# Patient Record
Sex: Female | Born: 1950 | Race: White | Hispanic: No | Marital: Married | Smoking: Never smoker
Health system: Southern US, Community
[De-identification: ages and names within clinical notes are randomized; demographics above are authoritative.]

## PROBLEM LIST (undated history)

## (undated) ENCOUNTER — Emergency Department (HOSPITAL_COMMUNITY): Admission: EM | Payer: Self-pay | Source: Home / Self Care

## (undated) DIAGNOSIS — I10 Essential (primary) hypertension: Secondary | ICD-10-CM

## (undated) DIAGNOSIS — M069 Rheumatoid arthritis, unspecified: Secondary | ICD-10-CM

## (undated) DIAGNOSIS — G8929 Other chronic pain: Secondary | ICD-10-CM

## (undated) DIAGNOSIS — N390 Urinary tract infection, site not specified: Secondary | ICD-10-CM

## (undated) DIAGNOSIS — R202 Paresthesia of skin: Secondary | ICD-10-CM

## (undated) DIAGNOSIS — K5792 Diverticulitis of intestine, part unspecified, without perforation or abscess without bleeding: Secondary | ICD-10-CM

## (undated) DIAGNOSIS — I6529 Occlusion and stenosis of unspecified carotid artery: Secondary | ICD-10-CM

## (undated) DIAGNOSIS — E785 Hyperlipidemia, unspecified: Secondary | ICD-10-CM

## (undated) DIAGNOSIS — M549 Dorsalgia, unspecified: Secondary | ICD-10-CM

## (undated) DIAGNOSIS — J189 Pneumonia, unspecified organism: Secondary | ICD-10-CM

## (undated) DIAGNOSIS — R200 Anesthesia of skin: Secondary | ICD-10-CM

## (undated) DIAGNOSIS — M1990 Unspecified osteoarthritis, unspecified site: Secondary | ICD-10-CM

## (undated) DIAGNOSIS — M797 Fibromyalgia: Secondary | ICD-10-CM

## (undated) HISTORY — DX: Hyperlipidemia, unspecified: E78.5

## (undated) HISTORY — DX: Essential (primary) hypertension: I10

## (undated) HISTORY — PX: COLON SURGERY: SHX602

## (undated) HISTORY — DX: Occlusion and stenosis of unspecified carotid artery: I65.29

## (undated) HISTORY — PX: COLOSTOMY: SHX63

---

## 1998-09-11 ENCOUNTER — Encounter: Payer: Self-pay | Admitting: *Deleted

## 1998-09-11 ENCOUNTER — Emergency Department (HOSPITAL_COMMUNITY): Admission: EM | Admit: 1998-09-11 | Discharge: 1998-09-11 | Payer: Self-pay | Admitting: Emergency Medicine

## 1998-09-12 ENCOUNTER — Emergency Department (HOSPITAL_COMMUNITY): Admission: EM | Admit: 1998-09-12 | Discharge: 1998-09-12 | Payer: Self-pay | Admitting: Emergency Medicine

## 2000-03-12 ENCOUNTER — Encounter: Payer: Self-pay | Admitting: Internal Medicine

## 2000-03-12 ENCOUNTER — Encounter: Admission: RE | Admit: 2000-03-12 | Discharge: 2000-03-12 | Payer: Self-pay | Admitting: Internal Medicine

## 2001-08-01 ENCOUNTER — Other Ambulatory Visit: Admission: RE | Admit: 2001-08-01 | Discharge: 2001-08-01 | Payer: Self-pay

## 2002-02-19 ENCOUNTER — Encounter: Admission: RE | Admit: 2002-02-19 | Discharge: 2002-02-19 | Payer: Self-pay

## 2010-11-17 ENCOUNTER — Inpatient Hospital Stay (HOSPITAL_COMMUNITY)
Admission: EM | Admit: 2010-11-17 | Discharge: 2010-11-20 | Payer: Self-pay | Source: Home / Self Care | Attending: Internal Medicine | Admitting: Internal Medicine

## 2010-11-20 LAB — BASIC METABOLIC PANEL
BUN: 9 mg/dL (ref 6–23)
BUN: 10 mg/dL (ref 6–23)
CO2: 24 mEq/L (ref 19–32)
CO2: 24 mEq/L (ref 19–32)
Calcium: 9.6 mg/dL (ref 8.4–10.5)
Calcium: 8.2 mg/dL — ABNORMAL LOW (ref 8.4–10.5)
Chloride: 104 mEq/L (ref 96–112)
Chloride: 109 mEq/L (ref 96–112)
Creatinine, Ser: 1.21 mg/dL — ABNORMAL HIGH (ref 0.4–1.2)
Creatinine, Ser: 1.06 mg/dL (ref 0.4–1.2)
GFR calc Af Amer: 55 mL/min — ABNORMAL LOW (ref >60)
GFR calc Af Amer: >60 mL/min (ref >60)
GFR calc non Af Amer: 46 mL/min — ABNORMAL LOW (ref >60)
GFR calc non Af Amer: 53 mL/min — ABNORMAL LOW (ref >60)
Glucose, Bld: 109 mg/dL — ABNORMAL HIGH (ref 70–99)
Glucose, Bld: 96 mg/dL (ref 70–99)
Potassium: 4 mEq/L (ref 3.5–5.1)
Potassium: 3.9 mEq/L (ref 3.5–5.1)
Sodium: 140 mEq/L (ref 135–145)
Sodium: 141 mEq/L (ref 135–145)

## 2010-11-20 LAB — POCT CARDIAC MARKERS
CKMB, poc: <1 ng/mL — ABNORMAL LOW (ref 1.0–8.0)
Myoglobin, poc: 103 ng/mL (ref 12–200)
Troponin i, poc: <0.05 ng/mL (ref 0.00–0.09)

## 2010-11-20 LAB — URINALYSIS, ROUTINE W REFLEX MICROSCOPIC
Bilirubin Urine: NEGATIVE
Hgb urine dipstick: NEGATIVE
Ketones, ur: >80 mg/dL — AB
Nitrite: NEGATIVE
Protein, ur: NEGATIVE mg/dL
Specific Gravity, Urine: 1.017 (ref 1.005–1.030)
Urine Glucose, Fasting: NEGATIVE mg/dL
Urobilinogen, UA: 0.2 mg/dL (ref 0.0–1.0)
pH: 7.5 (ref 5.0–8.0)

## 2010-11-20 LAB — CBC
HCT: 43.8 % (ref 36.0–46.0)
HCT: 36.1 % (ref 36.0–46.0)
Hemoglobin: 14.8 g/dL (ref 12.0–15.0)
Hemoglobin: 11.9 g/dL — ABNORMAL LOW (ref 12.0–15.0)
MCH: 30.1 pg (ref 26.0–34.0)
MCH: 29.3 pg (ref 26.0–34.0)
MCHC: 33.8 g/dL (ref 30.0–36.0)
MCHC: 33 g/dL (ref 30.0–36.0)
MCV: 89 fL (ref 78.0–100.0)
MCV: 88.9 fL (ref 78.0–100.0)
Platelets: 172 10*3/uL (ref 150–400)
Platelets: 149 10*3/uL — ABNORMAL LOW (ref 150–400)
RBC: 4.92 MIL/uL (ref 3.87–5.11)
RBC: 4.06 MIL/uL (ref 3.87–5.11)
RDW: 14.2 % (ref 11.5–15.5)
RDW: 14.5 % (ref 11.5–15.5)
WBC: 5.8 10*3/uL (ref 4.0–10.5)
WBC: 4.5 10*3/uL (ref 4.0–10.5)

## 2010-11-20 LAB — DIFFERENTIAL
Basophils Absolute: 0 10*3/uL (ref 0.0–0.1)
Basophils Absolute: 0 10*3/uL (ref 0.0–0.1)
Basophils Relative: 1 % (ref 0–1)
Basophils Relative: 0 % (ref 0–1)
Eosinophils Absolute: 0.1 10*3/uL (ref 0.0–0.7)
Eosinophils Absolute: 0 10*3/uL (ref 0.0–0.7)
Eosinophils Relative: 2 % (ref 0–5)
Eosinophils Relative: 1 % (ref 0–5)
Lymphocytes Relative: 18 % (ref 12–46)
Lymphocytes Relative: 32 % (ref 12–46)
Lymphs Abs: 1 10*3/uL (ref 0.7–4.0)
Lymphs Abs: 1.4 10*3/uL (ref 0.7–4.0)
Monocytes Absolute: 0.4 10*3/uL (ref 0.1–1.0)
Monocytes Absolute: 0.5 10*3/uL (ref 0.1–1.0)
Monocytes Relative: 7 % (ref 3–12)
Monocytes Relative: 11 % (ref 3–12)
Neutro Abs: 4.2 10*3/uL (ref 1.7–7.7)
Neutro Abs: 2.5 10*3/uL (ref 1.7–7.7)
Neutrophils Relative %: 73 % (ref 43–77)
Neutrophils Relative %: 56 % (ref 43–77)

## 2010-11-20 LAB — BLOOD GAS, ARTERIAL
Acid-base deficit: 3.5 mmol/L — ABNORMAL HIGH (ref 0.0–2.0)
Bicarbonate: 17.1 mEq/L — ABNORMAL LOW (ref 20.0–24.0)
Drawn by: 252031
O2 Content: 2 L/min
O2 Saturation: 95.1 %
Patient temperature: 98.6
TCO2: 14.9 mmol/L (ref 0–100)
pCO2 arterial: 20.3 mmHg — ABNORMAL LOW (ref 35.0–45.0)
pH, Arterial: 7.535 — ABNORMAL HIGH (ref 7.350–7.400)
pO2, Arterial: 66.2 mmHg — ABNORMAL LOW (ref 80.0–100.0)

## 2010-11-20 LAB — PROCALCITONIN: Procalcitonin: <0.1 ng/mL

## 2010-11-20 LAB — BRAIN NATRIURETIC PEPTIDE: Pro B Natriuretic peptide (BNP): <30 pg/mL (ref 0.0–100.0)

## 2010-11-20 LAB — URINE MICROSCOPIC-ADD ON

## 2010-11-20 LAB — LACTIC ACID, PLASMA: Lactic Acid, Venous: 2.7 mmol/L — ABNORMAL HIGH (ref 0.5–2.2)

## 2010-11-20 LAB — D-DIMER, QUANTITATIVE: D-Dimer, Quant: <0.22 ug/mL-FEU (ref 0.00–0.48)

## 2010-11-28 NOTE — Discharge Summary (Signed)
  Jenny Glass, Jenny Glass NO.:  [id number]  MEDICAL RECORD NO.:  [id number]          PATIENT TYPE:  INP  LOCATION:  1338                         FACILITY:  [facility]  PHYSICIAN:  Jenny Sacks, MD    DATE OF BIRTH:  03-Jun-1951  DATE OF ADMISSION:  11/17/2010 DATE OF DISCHARGE:  11/20/2010                              DISCHARGE SUMMARY   CONDITION ON DISCHARGE:  Improved.  DISCHARGE DIAGNOSES: 1. Influenza like illness, resolved. 2. Presumed community acquired pneumonia. 3. Urinary tract infection. 4. Acute renal failure, resolved.  HISTORY OF PRESENT ILLNESS:  This is a 60 year old woman who presented to the emergency room with fever, chills and cough and was admitted for treatment of pneumonia.  HOSPITAL COURSE: 1. Influenza like illness.  The patient was treated with supportive     care and as below.  Symptoms quickly resolved. 2. Presumed community-acquired pneumonia.  The patient did not     tolerate Zithromax so she was changed Avelox and she will complete     this in the outpatient setting for a total 7 days.  She is afebrile     within normal respiratory rate and oxygenation.  Of note her     symptoms began approximately 5 days before presentation. 3. Urinary tract infection. Received three doses of Rocephin.  This     was treated empirically as no culture was obtained. 4. Acute renal failure.  This resolved and was likely secondary to     dehydration.  CONSULTATIONS:  None.  PROCEDURES:  None.  IMAGING:  Chest x-ray January 13th:  Mild changes of bronchitis or asthma.  MICROBIOLOGY:  None.  PERTINENT LABORATORY STUDIES: 1. Urinalysis was grossly positive. 2. D-dimer was negative. 3. ABG was notable for respiratory alkalosis and hypoxemia. 4. Serum lactic acid was minimally elevated, pro calcitonin was less     than 0.10. 5. BNP was less than 30. 6. CBC was essentially unremarkable. 7. Basic metabolic panel notable for creatinine 1.21 on  admission 1.06     on discharge. 8. One set of cardiac enzymes were negative.  EXAMINATION ON DISCHARGE:  Documented in progress notes.  DISCHARGE INSTRUCTIONS:  The patient will be discharged home.  Diet: Unrestricted.  Activity: Unrestricted.  She has been encouraged to establish with a primary care physician in 2-4 weeks.  DISCHARGE MEDICATIONS: 1. Albuterol inhaler 2 puffs every 4 hours as needed for shortness of     breath or wheezing. 2. Tussionex 5 mL p.o. q.12 h for cough p.r.n. 3. Moxifloxacin 400 mg p.o. daily to complete a 7 day course. 4. Aspirin 81 mg p.o. daily. 5. Vitamin B complex with vitamin C daily. 6. Vitamin D3 over-the-counter p.o. daily. 7. Vitamin E over-the-counter p.o. daily.  TIME COORDINATING DISCHARGE:  20 minutes.     Jenny Sacks, MD     DG/MEDQ  D:  11/20/2010  T:  11/20/2010  Job:  [id number]  Electronically Signed by Jenny Glass  on 11/28/2010 02:14:15 PM

## 2011-11-01 ENCOUNTER — Ambulatory Visit (INDEPENDENT_AMBULATORY_CARE_PROVIDER_SITE_OTHER): Payer: Managed Care, Other (non HMO)

## 2011-11-01 DIAGNOSIS — J029 Acute pharyngitis, unspecified: Secondary | ICD-10-CM

## 2011-11-01 DIAGNOSIS — J385 Laryngeal spasm: Secondary | ICD-10-CM

## 2011-11-01 DIAGNOSIS — K219 Gastro-esophageal reflux disease without esophagitis: Secondary | ICD-10-CM

## 2011-11-24 ENCOUNTER — Ambulatory Visit (INDEPENDENT_AMBULATORY_CARE_PROVIDER_SITE_OTHER): Payer: Managed Care, Other (non HMO)

## 2011-11-24 DIAGNOSIS — R1084 Generalized abdominal pain: Secondary | ICD-10-CM

## 2011-11-24 DIAGNOSIS — K5900 Constipation, unspecified: Secondary | ICD-10-CM

## 2012-02-16 ENCOUNTER — Ambulatory Visit (INDEPENDENT_AMBULATORY_CARE_PROVIDER_SITE_OTHER): Payer: Managed Care, Other (non HMO) | Admitting: Family Medicine

## 2012-02-16 VITALS — BP 112/74 | HR 81 | Temp 98.5°F | Resp 16 | Ht 63.25 in | Wt 162.8 lb

## 2012-02-16 DIAGNOSIS — K5732 Diverticulitis of large intestine without perforation or abscess without bleeding: Secondary | ICD-10-CM

## 2012-02-16 DIAGNOSIS — R1032 Left lower quadrant pain: Secondary | ICD-10-CM

## 2012-02-16 LAB — COMPREHENSIVE METABOLIC PANEL
ALT: <8 U/L (ref 0–35)
AST: 11 U/L (ref 0–37)
Albumin: 4.2 g/dL (ref 3.5–5.2)
Alkaline Phosphatase: 54 U/L (ref 39–117)
BUN: 11 mg/dL (ref 6–23)
CO2: 30 mEq/L (ref 19–32)
Calcium: 9.2 mg/dL (ref 8.4–10.5)
Chloride: 107 mEq/L (ref 96–112)
Creat: 1 mg/dL (ref 0.50–1.10)
Glucose, Bld: 93 mg/dL (ref 70–99)
Potassium: 4.2 mEq/L (ref 3.5–5.3)
Sodium: 144 mEq/L (ref 135–145)
Total Bilirubin: 0.4 mg/dL (ref 0.3–1.2)
Total Protein: 6.5 g/dL (ref 6.0–8.3)

## 2012-02-16 LAB — POCT CBC
Granulocyte percent: 62.9 %G (ref 37–80)
HCT, POC: 37.7 % (ref 37.7–47.9)
Hemoglobin: 11.8 g/dL — AB (ref 12.2–16.2)
Lymph, poc: 2.8 (ref 0.6–3.4)
MCH, POC: 27.5 pg (ref 27–31.2)
MCHC: 31.3 g/dL — AB (ref 31.8–35.4)
MCV: 87.8 fL (ref 80–97)
MID (cbc): 1 — AB (ref 0–0.9)
MPV: 10.5 fL (ref 0–99.8)
POC Granulocyte: 6.4 (ref 2–6.9)
POC LYMPH PERCENT: 27.6 %L (ref 10–50)
POC MID %: 9.5 %M (ref 0–12)
Platelet Count, POC: 242 10*3/uL (ref 142–424)
RBC: 4.29 M/uL (ref 4.04–5.48)
RDW, POC: 16.4 %
WBC: 10.2 10*3/uL (ref 4.6–10.2)

## 2012-02-16 LAB — POCT UA - MICROSCOPIC ONLY
Casts, Ur, LPF, POC: NEGATIVE
Crystals, Ur, HPF, POC: NEGATIVE
Mucus, UA: NEGATIVE
Yeast, UA: NEGATIVE

## 2012-02-16 LAB — POCT URINALYSIS DIPSTICK
Bilirubin, UA: NEGATIVE
Glucose, UA: NEGATIVE
Ketones, UA: NEGATIVE
Nitrite, UA: NEGATIVE
Protein, UA: NEGATIVE
Spec Grav, UA: 1.015
Urobilinogen, UA: 0.2
pH, UA: 6

## 2012-02-16 MED ORDER — TRAMADOL HCL 50 MG PO TABS: 50.0000 mg | ORAL_TABLET | Freq: Three times a day (TID) | ORAL | Status: AC | PRN

## 2012-02-16 MED ORDER — CIPROFLOXACIN HCL 500 MG PO TABS: 500.0000 mg | ORAL_TABLET | Freq: Two times a day (BID) | ORAL | Status: AC

## 2012-02-16 MED ORDER — METRONIDAZOLE 500 MG PO TABS: 500.0000 mg | ORAL_TABLET | Freq: Three times a day (TID) | ORAL | Status: AC

## 2012-02-16 NOTE — Patient Instructions (Signed)
Diverticulitis A diverticulum is a small pouch or sac on the colon. Diverticulosis is the presence of these diverticula on the colon. Diverticulitis is the irritation (inflammation) or infection of diverticula. CAUSES  The colon and its diverticula contain bacteria. If food particles block the tiny opening to a diverticulum, the bacteria inside can grow and cause an increase in pressure. This leads to infection and inflammation and is called diverticulitis. SYMPTOMS   Abdominal pain and tenderness. Usually, the pain is located on the left side of your abdomen. However, it could be located elsewhere.   Fever.   Bloating.   Feeling sick to your stomach (nausea).   Throwing up (vomiting).   Abnormal stools.  DIAGNOSIS  Your caregiver will take a history and perform a physical exam. Since many things can cause abdominal pain, other tests may be necessary. Tests may include:  Blood tests.   Urine tests.   X-ray of the abdomen.   CT scan of the abdomen.  Sometimes, surgery is needed to determine if diverticulitis or other conditions are causing your symptoms. TREATMENT  Most of the time, you can be treated without surgery. Treatment includes:  Resting the bowels by only having liquids for a few days. As you improve, you will need to eat a low-fiber diet.   Intravenous (IV) fluids if you are losing body fluids (dehydrated).   Antibiotic medicines that treat infections may be given.   Pain and nausea medicine, if needed.   Surgery if the inflamed diverticulum has burst.  HOME CARE INSTRUCTIONS   Try a clear liquid diet (broth, tea, or water for as long as directed by your caregiver). You may then gradually begin a low-fiber diet as tolerated. A low-fiber diet is a diet with less than 10 grams of fiber. Choose the foods below to reduce fiber in the diet:   White breads, cereals, rice, and pasta.   Cooked fruits and vegetables or soft fresh fruits and vegetables without the skin.     Ground or well-cooked tender beef, ham, veal, lamb, pork, or poultry.   Eggs and seafood.   After your diverticulitis symptoms have improved, your caregiver may put you on a high-fiber diet. A high-fiber diet includes 14 grams of fiber for every 1000 calories consumed. For a standard 2000 calorie diet, you would need 28 grams of fiber. Follow these diet guidelines to help you increase the fiber in your diet. It is important to slowly increase the amount fiber in your diet to avoid gas, constipation, and bloating.   Choose whole-grain breads, cereals, pasta, and brown rice.   Choose fresh fruits and vegetables with the skin on. Do not overcook vegetables because the more vegetables are cooked, the more fiber is lost.   Choose more nuts, seeds, legumes, dried peas, beans, and lentils.   Look for food products that have greater than 3 grams of fiber per serving on the Nutrition Facts label.   Take all medicine as directed by your caregiver.   If your caregiver has given you a follow-up appointment, it is very important that you go. Not going could result in lasting (chronic) or permanent injury, pain, and disability. If there is any problem keeping the appointment, call to reschedule.  SEEK MEDICAL CARE IF:   Your pain does not improve.   You have a hard time advancing your diet beyond clear liquids.   Your bowel movements do not return to normal.  SEEK IMMEDIATE MEDICAL CARE IF:   Your pain becomes   worse.   You have an oral temperature above 102 F (38.9 C), not controlled by medicine.   You have repeated vomiting.   You have bloody or black, tarry stools.   Symptoms that brought you to your caregiver become worse or are not getting better.  MAKE SURE YOU:   Understand these instructions.   Will watch your condition.   Will get help right away if you are not doing well or get worse.  Document Released: 08/01/2005 Document Revised: 10/11/2011 Document Reviewed:  11/27/2010 [employer] Patient Information 2012 [employer]. 

## 2012-02-16 NOTE — Progress Notes (Signed)
61 yo woman with LLQ pain.  Sx began as nausea on Wednesday, but yesterday awoke with sharp LLQ pain.  Pain worsens with movement or jarring.  Not able to sleep well, some sweats and chills. No h/o diverticulitis  O:  NAD Heart:  Reg, no murmur or tachycardia Chest:  Clear, no tachypnes Abdomen:  Tender LLQ, positive rebound and guarding but soft.  No HSM or mass Skin:  No jaundice  Results for orders placed during the hospital encounter of 11/17/10  URINALYSIS, ROUTINE W REFLEX MICROSCOPIC      Component Value Range   Color, Urine YELLOW  YELLOW    APPearance CLOUDY (*) CLEAR    Specific Gravity, Urine 1.017  1.005 - 1.030    pH 7.5  5.0 - 8.0    Urine Glucose, Fasting NEGATIVE  NEGATIVE (mg/dL)   Hgb urine dipstick NEGATIVE  NEGATIVE    Bilirubin Urine NEGATIVE  NEGATIVE    Ketones, ur >80 (*) NEGATIVE (mg/dL)   Protein, ur NEGATIVE  NEGATIVE (mg/dL)   Urobilinogen, UA 0.2  0.0 - 1.0 (mg/dL)   Nitrite NEGATIVE  NEGATIVE    Leukocytes, UA LARGE (*) NEGATIVE   URINE MICROSCOPIC-ADD ON      Component Value Range   Squamous Epithelial / LPF MANY (*) RARE    WBC, UA TOO NUMEROUS TO COUNT  <3 (WBC/hpf)   RBC / HPF 0-2  <3 (RBC/hpf)   Bacteria, UA FEW (*) RARE    Urine-Other MUCOUS PRESENT    DIFFERENTIAL      Component Value Range   Neutrophils Relative 73  43 - 77 (%)   Neutro Abs 4.2  1.7 - 7.7 (K/uL)   Lymphocytes Relative 18  12 - 46 (%)   Lymphs Abs 1.0  0.7 - 4.0 (K/uL)   Monocytes Relative 7  3 - 12 (%)   Monocytes Absolute 0.4  0.1 - 1.0 (K/uL)   Eosinophils Relative 2  0 - 5 (%)   Eosinophils Absolute 0.1  0.0 - 0.7 (K/uL)   Basophils Relative 1  0 - 1 (%)   Basophils Absolute 0.0  0.0 - 0.1 (K/uL)  CBC      Component Value Range   WBC 5.8  4.0 - 10.5 (K/uL)   RBC 4.92  3.87 - 5.11 (MIL/uL)   Hemoglobin 14.8  12.0 - 15.0 (g/dL)   HCT 16.1  09.6 - 04.5 (%)   MCV 89.0  78.0 - 100.0 (fL)   MCH 30.1  26.0 - 34.0 (pg)   MCHC 33.8  30.0 - 36.0 (g/dL)   RDW 40.9   81.1 - 91.4 (%)   Platelets 172  150 - 400 (K/uL)  D-DIMER, QUANTITATIVE      Component Value Range   D-Dimer, Quant    0.00 - 0.48 (ug/mL-FEU)   Value: <0.22            AT THE INHOUSE ESTABLISHED CUTOFF     VALUE OF 0.48 ug/mL FEU,     THIS ASSAY HAS BEEN DOCUMENTED     IN THE LITERATURE TO HAVE     A SENSITIVITY AND NEGATIVE     PREDICTIVE VALUE OF AT LEAST     98 TO 99%.  THE TEST RESULT     SHOULD BE CORRELATED WITH     AN ASSESSMENT OF THE CLINICAL     PROBABILITY OF DVT / VTE.  BASIC METABOLIC PANEL      Component Value Range   Sodium 140  135 - 145 (mEq/L)   Potassium 4.0  3.5 - 5.1 (mEq/L)   Chloride 104  96 - 112 (mEq/L)   CO2 24  19 - 32 (mEq/L)   Glucose, Bld 109 (*) 70 - 99 (mg/dL)   BUN 9  6 - 23 (mg/dL)   Creatinine, Ser 1.19 (*) 0.4 - 1.2 (mg/dL)   Calcium 9.6  8.4 - 14.7 (mg/dL)   GFR calc non Af Amer 46 (*) >60 (mL/min)   GFR calc Af Amer   (*) >60 (mL/min)   Value: 55            The eGFR has been calculated     using the MDRD equation.     This calculation has not been     validated in all clinical     situations.     eGFR's persistently     <60 mL/min signify     possible Chronic Kidney Disease.  POCT CARDIAC MARKERS      Component Value Range   Myoglobin, poc 103  12 - 200 (ng/mL)   CKMB, poc <1.0 (*) 1.0 - 8.0 (ng/mL)   Troponin i, poc <0.05  0.00 - 0.09 (ng/mL)   Comment       Value:            TROPONIN VALUES IN THE RANGE     OF 0.00-0.09 ng/mL SHOW     NO INDICATION OF     MYOCARDIAL INJURY.                PERSISTENTLY INCREASED TROPONIN     VALUES IN THE RANGE OF 0.10-0.24     ng/mL CAN BE SEEN IN:           -UNSTABLE ANGINA           -CONGESTIVE HEART FAILURE           -MYOCARDITIS           -CHEST TRAUMA           -ARRYHTHMIAS           -LATE PRESENTING MI           -COPD       CLINICAL FOLLOW-UP RECOMMENDED.                TROPONIN VALUES >=0.25 ng/mL     INDICATE POSSIBLE MYOCARDIAL     ISCHEMIA. SERIAL TESTING      RECOMMENDED.  BLOOD GAS, ARTERIAL      Component Value Range   O2 Content 2.0     Delivery systems NASAL CANNULA     pH, Arterial 7.535 (*) 7.350 - 7.400    pCO2 arterial 20.3 (*) 35.0 - 45.0 (mmHg)   pO2, Arterial 66.2 (*) 80.0 - 100.0 (mmHg)   Bicarbonate 17.1 (*) 20.0 - 24.0 (mEq/L)   TCO2 14.9  0 - 100 (mmol/L)   Acid-base deficit 3.5 (*) 0.0 - 2.0 (mmol/L)   O2 Saturation 95.1     Patient temperature 98.6     Collection site RIGHT RADIAL     Drawn by [id number]     Sample type ARTERIAL DRAW     Allens test (pass/fail) PASS  PASS   LACTIC ACID, PLASMA      Component Value Range   Lactic Acid, Venous 2.7 (*) 0.5 - 2.2 (mmol/L)  BRAIN NATRIURETIC PEPTIDE      Component Value Range   Pro B Natriuretic peptide (BNP) <30.0  0.0 - 100.0 (pg/mL)  PROCALCITONIN      Component Value Range   Procalcitonin       Value: <0.10            PCT (Procalcitonin) <= 0.5 ng/mL:     Systemic infection (sepsis) is not likely.     Local bacterial infection is possible.                PCT > 0.5 ng/mL and <= 2 ng/mL:     Systemic infection (sepsis) is possible,     but other conditions are known to elevate     PCT as well.                PCT > 2 ng/mL:     Systemic infection (sepsis) is likely,     unless other causes are known.                PCT >= 10 ng/mL:     Important systemic inflammatory response,     almost exclusively due to severe bacterial     sepsis or septic shock.  BASIC METABOLIC PANEL      Component Value Range   Sodium 141  135 - 145 (mEq/L)   Potassium 3.9  3.5 - 5.1 (mEq/L)   Chloride 109  96 - 112 (mEq/L)   CO2 24  19 - 32 (mEq/L)   Glucose, Bld 96  70 - 99 (mg/dL)   BUN 10  6 - 23 (mg/dL)   Creatinine, Ser 1.61  0.4 - 1.2 (mg/dL)   Calcium 8.2 (*) 8.4 - 10.5 (mg/dL)   GFR calc non Af Amer 53 (*) >60 (mL/min)   GFR calc Af Amer    >60 (mL/min)   Value: >60            The eGFR has been calculated     using the MDRD equation.     This calculation has not been      validated in all clinical     situations.     eGFR's persistently     <60 mL/min signify     possible Chronic Kidney Disease.  DIFFERENTIAL      Component Value Range   Neutrophils Relative 56  43 - 77 (%)   Neutro Abs 2.5  1.7 - 7.7 (K/uL)   Lymphocytes Relative 32  12 - 46 (%)   Lymphs Abs 1.4  0.7 - 4.0 (K/uL)   Monocytes Relative 11  3 - 12 (%)   Monocytes Absolute 0.5  0.1 - 1.0 (K/uL)   Eosinophils Relative 1  0 - 5 (%)   Eosinophils Absolute 0.0  0.0 - 0.7 (K/uL)   Basophils Relative 0  0 - 1 (%)   Basophils Absolute 0.0  0.0 - 0.1 (K/uL)  CBC      Component Value Range   WBC 4.5  4.0 - 10.5 (K/uL)   RBC 4.06  3.87 - 5.11 (MIL/uL)   Hemoglobin   (*) 12.0 - 15.0 (g/dL)   Value: 09.6 DELTA CHECK NOTED RESULT REPEATED AND VERIFIED   HCT 36.1  36.0 - 46.0 (%)   MCV 88.9  78.0 - 100.0 (fL)   MCH 29.3  26.0 - 34.0 (pg)   MCHC 33.0  30.0 - 36.0 (g/dL)   RDW 04.5  40.9 - 81.1 (%)   Platelets 149 (*) 150 - 400 (K/uL)   A: UTI with diverticulitis Not acutely ill P:  gI referral for colonoscopy Urine cx cipro and flagyl,  tramadol Call Monday INB

## 2012-02-19 LAB — URINE CULTURE: Colony Count: >=100000

## 2012-02-21 ENCOUNTER — Encounter: Payer: Self-pay | Admitting: Family Medicine

## 2012-04-07 LAB — HM COLONOSCOPY

## 2012-04-16 ENCOUNTER — Other Ambulatory Visit: Payer: Self-pay | Admitting: Emergency Medicine

## 2012-04-16 ENCOUNTER — Ambulatory Visit
Admission: RE | Admit: 2012-04-16 | Discharge: 2012-04-16 | Disposition: A | Discharge status: Home / Self Care | Payer: Managed Care, Other (non HMO) | Source: Ambulatory Visit | Attending: Emergency Medicine | Admitting: Emergency Medicine

## 2012-04-16 ENCOUNTER — Ambulatory Visit (INDEPENDENT_AMBULATORY_CARE_PROVIDER_SITE_OTHER): Payer: Managed Care, Other (non HMO) | Admitting: Emergency Medicine

## 2012-04-16 VITALS — BP 119/83 | HR 72 | Temp 98.5°F | Resp 16 | Ht 63.0 in | Wt 162.0 lb

## 2012-04-16 DIAGNOSIS — I700 Atherosclerosis of aorta: Secondary | ICD-10-CM

## 2012-04-16 DIAGNOSIS — I739 Peripheral vascular disease, unspecified: Secondary | ICD-10-CM

## 2012-04-16 MED ORDER — TRAMADOL HCL 50 MG PO TABS: 50.0000 mg | ORAL_TABLET | Freq: Four times a day (QID) | ORAL | Status: AC | PRN

## 2012-04-16 NOTE — Patient Instructions (Addendum)
Place peripheral vascular disease patient instructions here. Peripheral Vascular Disease Peripheral Vascular Disease (PVD), also called Peripheral Arterial Disease (PAD), is a circulation problem caused by cholesterol (atherosclerotic plaque) deposits in the arteries. PVD commonly occurs in the lower extremities (legs) but it can occur in other areas of the body, such as your arms. The cholesterol buildup in the arteries reduces blood flow which can cause pain and other serious problems. The presence of PVD can place a person at risk for Coronary Artery Disease (CAD).  CAUSES  Causes of PVD can be many. It is usually associated with more than one risk factor such as:   High Cholesterol.   Smoking.   Diabetes.   Lack of exercise or inactivity.   High blood pressure (hypertension).   Obesity.   Family history.  SYMPTOMS   When the lower extremities are affected, patients with PVD may experience:   Leg pain with exertion or physical activity. This is called INTERMITTENT CLAUDICATION. This may present as cramping or numbness with physical activity. The location of the pain is associated with the level of blockage. For example, blockage at the abdominal level (distal abdominal aorta) may result in buttock or hip pain. Lower leg arterial blockage may result in calf pain.   As PVD becomes more severe, pain can develop with less physical activity.   In people with severe PVD, leg pain may occur at rest.   Other PVD signs and symptoms:   Leg numbness or weakness.   Coldness in the affected leg or foot, especially when compared to the other leg.   A change in leg color.   Patients with significant PVD are more prone to ulcers or sores on toes, feet or legs. These may take longer to heal or may reoccur. The ulcers or sores can become infected.   If signs and symptoms of PVD are ignored, gangrene may occur. This can result in the loss of toes or loss of an entire limb.   Not all leg  pain is related to PVD. Other medical conditions can cause leg pain such as:   Blood clots (embolism) or Deep Vein Thrombosis.   Inflammation of the blood vessels (vasculitis).   Spinal stenosis.  DIAGNOSIS  Diagnosis of PVD can involve several different types of tests. These can include:  Pulse Volume Recording Method (PVR). This test is simple, painless and does not involve the use of X-rays. PVR involves measuring and comparing the blood pressure in the arms and legs. An ABI (Ankle-Brachial Index) is calculated. The normal ratio of blood pressures is 1. As this number becomes smaller, it indicates more severe disease.   < 0.95 - indicates significant narrowing in one or more leg vessels.   <0.8 - there will usually be pain in the foot, leg or buttock with exercise.   <0.4 - will usually have pain in the legs at rest.   <0.25 - usually indicates limb threatening PVD.   Doppler detection of pulses in the legs. This test is painless and checks to see if you have a pulses in your legs/feet.   A dye or contrast material (a substance that highlights the blood vessels so they show up on x-ray) may be given to help your caregiver better see the arteries for the following tests. The dye is eliminated from your body by the kidney's. Your caregiver may order blood work to check your kidney function and other laboratory values before the following tests are performed:   Magnetic Resonance  Angiography (MRA). An MRA is a picture study of the blood vessels and arteries. The MRA machine uses a large magnet to produce images of the blood vessels.   Computed Tomography Angiography (CTA). A CTA is a specialized x-ray that looks at how the blood flows in your blood vessels. An IV may be inserted into your arm so contrast dye can be injected.   Angiogram. Is a procedure that uses x-rays to look at your blood vessels. This procedure is minimally invasive, meaning a small incision (cut) is made in your  groin. A small tube (catheter) is then inserted into the artery of your groin. The catheter is guided to the blood vessel or artery your caregiver wants to examine. Contrast dye is injected into the catheter. X-rays are then taken of the blood vessel or artery. After the images are obtained, the catheter is taken out.  TREATMENT  Treatment of PVD involves many interventions which may include:  Lifestyle changes:   Quitting smoking.   Exercise.   Following a low fat, low cholesterol diet.   Control of diabetes.   Foot care is very important to the PVD patient. Good foot care can help prevent infection.   Medication:   Cholesterol-lowering medicine.   Blood pressure medicine.   Anti-platelet drugs.   Certain medicines may reduce symptoms of Intermittent Claudication.   Interventional/Surgical options:   Angioplasty. An Angioplasty is a procedure that inflates a balloon in the blocked artery. This opens the blocked artery to improve blood flow.   Stent Implant. A wire mesh tube (stent) is placed in the artery. The stent expands and stays in place, allowing the artery to remain open.   Peripheral Bypass Surgery. This is a surgical procedure that reroutes the blood around a blocked artery to help improve blood flow. This type of procedure may be performed if Angioplasty or stent implants are not an option.  SEEK IMMEDIATE MEDICAL CARE IF:   You develop pain or numbness in your arms or legs.   Your arm or leg turns cold, becomes blue in color.   You develop redness, warmth, swelling and pain in your arms or legs.  MAKE SURE YOU:   Understand these instructions.   Will watch your condition.   Will get help right away if you are not doing well or get worse.  Document Released: 11/29/2004 Document Revised: 10/11/2011 Document Reviewed: 10/26/2008 [facility] Patient Information 2012 [place], [place].

## 2012-04-16 NOTE — Progress Notes (Signed)
  Subjective:     Jenny Glass is an 61 y.o. female who presents for evaluation of claudication. Pain is located mainly in the left buttocks, left thigh, right calf and left calf The pain is described as aching and burning, and is 3/10 in intensity. Pain is intermittent. Onset was 2 weeks ago. Symptoms have been worsening since that time. The patient's risks factors for atherosclerosis include: smoking. The patient denies a history of arteriosclerotic heart disease, cerebral vascular disease, diabetes mellitus, hypercholesterolemia, hypertension and peripheral vascular disease.  The following portions of the patient's history were reviewed and updated as appropriate: allergies, current medications, past family history, past medical history, past social history, past surgical history and problem list.  Review of Systems A comprehensive review of systems was negative.     Objective:    BP 119/83  Pulse 72  Temp 98.5 F (36.9 C) (Oral)  Resp 16  Ht 5\' 3"  (1.6 m)  Wt 162 lb (73.483 kg)  BMI 28.70 kg/m2  SpO2 99%  General Appearance:    Alert, cooperative, no distress, appears stated age  Head:    Normocephalic, without obvious abnormality, atraumatic  Eyes:    PERRL, conjunctiva/corneas clear, EOM's intact, fundi    benign, both eyes  Ears:    Normal TM's and external ear canals, both ears  Nose:   Nares normal, septum midline, mucosa normal, no drainage    or sinus tenderness  Throat:   Lips, mucosa, and tongue normal; teeth and gums normal  Neck:   Supple, symmetrical, trachea midline, no adenopathy;    thyroid:  no enlargement/tenderness/nodules; no carotid   bruit or JVD  Back:     Symmetric, no curvature, ROM normal, no CVA tenderness  Lungs:     Clear to auscultation bilaterally, respirations unlabored  Chest Wall:    No tenderness or deformity   Heart:    Regular rate and rhythm, S1 and S2 normal, no murmur, rub   or gallop  Breast Exam:    No tenderness, masses, or nipple  abnormality  Abdomen:     Soft, non-tender, bowel sounds active all four quadrants,    no masses, no organomegaly  Prominent, pulsatile non tender aorta  Genitalia:    Normal female without lesion, discharge or tenderness  Rectal:    Normal tone, normal prostate, no masses or tenderness;   guaiac negative stool  Extremities:   Extremities normal, atraumatic, no cyanosis or edema  Pulses:  absent in lower extremities 1+ femoral bilaterally  Skin:   Skin color, texture, turgor normal, no rashes or lesions  Lymph nodes:   Cervical, supraclavicular, and axillary nodes normal  Neurologic:   CNII-XII intact, normal strength, sensation and reflexes    throughout      Assessment:    abdominal aortic aneurysm, limb threatening ischemia    Plan:    Obtain ankle-brachial index.   Patient will follow up with me by phone tomorrow for results and if negative will consider HNP.

## 2012-04-17 ENCOUNTER — Ambulatory Visit (HOSPITAL_COMMUNITY)
Admission: RE | Admit: 2012-04-17 | Discharge: 2012-04-17 | Disposition: A | Discharge status: Home / Self Care | Payer: Managed Care, Other (non HMO) | Source: Ambulatory Visit | Attending: Emergency Medicine | Admitting: Emergency Medicine

## 2012-04-17 DIAGNOSIS — M79609 Pain in unspecified limb: Secondary | ICD-10-CM

## 2012-04-17 DIAGNOSIS — I739 Peripheral vascular disease, unspecified: Secondary | ICD-10-CM | POA: Insufficient documentation

## 2012-04-17 DIAGNOSIS — R0989 Other specified symptoms and signs involving the circulatory and respiratory systems: Secondary | ICD-10-CM | POA: Insufficient documentation

## 2012-04-17 NOTE — Progress Notes (Signed)
*  PRELIMINARY RESULTS* Vascular Ultrasound Lower Extremity Arterial Doppler has been completed.    VASCULAR LAB PRELIMINARY  ARTERIAL  ABI completed: Bilateral normal ABI study.    RIGHT    LEFT    PRESSURE WAVEFORM  PRESSURE WAVEFORM  BRACHIAL 127 tri BRACHIAL 126 tri  DP   DP    AT 141 tri AT 140 tri  PT 145 tri PT 135 tri  PER   PER    GREAT TOE  NA GREAT TOE  NA    RIGHT LEFT  ABI 1.14 1.10     Farrel Demark, RDMS 04/17/2012, 9:35 AM      Farrel Demark 04/17/2012, 9:34 AM

## 2012-10-11 ENCOUNTER — Ambulatory Visit (INDEPENDENT_AMBULATORY_CARE_PROVIDER_SITE_OTHER): Payer: BC Managed Care – PPO | Admitting: Family Medicine

## 2012-10-11 VITALS — BP 117/81 | HR 82 | Temp 98.1°F | Resp 16 | Ht 63.0 in | Wt 170.0 lb

## 2012-10-11 DIAGNOSIS — R110 Nausea: Secondary | ICD-10-CM

## 2012-10-11 DIAGNOSIS — J329 Chronic sinusitis, unspecified: Secondary | ICD-10-CM

## 2012-10-11 DIAGNOSIS — J40 Bronchitis, not specified as acute or chronic: Secondary | ICD-10-CM

## 2012-10-11 MED ORDER — HYDROCODONE-HOMATROPINE 5-1.5 MG/5ML PO SYRP: 5.0000 mL | ORAL_SOLUTION | ORAL | Status: DC | PRN

## 2012-10-11 MED ORDER — AMOXICILLIN 875 MG PO TABS: 875.0000 mg | ORAL_TABLET | Freq: Two times a day (BID) | ORAL | Status: DC

## 2012-10-11 MED ORDER — PROMETHAZINE HCL 12.5 MG PO TABS: ORAL_TABLET | ORAL | Status: DC

## 2012-10-11 NOTE — Patient Instructions (Signed)
Drink lots of fluids and get enough rest.  Tylenol or ibuprofen if achy or running a fever.  Return if worse.

## 2012-10-11 NOTE — Progress Notes (Signed)
Subjective: 61 year old female who has had problems with a respiratory tract infection for about 6 days. She had head congestion and drainage down the back of her throat. Some runny nose. It's settled in her chest she's been coughing the last couple of days. She's been nauseous. She does not get an excessive number of respiratory tract infection. She's not been running any fevers. She just doesn't feel good.  Objective: Pleasant lady in no major distress. TMs are normal. She said the right ear hurts some but it looks fine. The left is mostly occluded by some cerumen. Throat has erythema posteriorly where she said postnasal drainage. Neck is supple without nodes. Chest is clear. Heart regular without murmurs. Abdomen soft nontender.  Assessment: Sinusitis/bronchitis Cerumen left ear Nausea  Plan: medications ordered for the cough and nausea. Encouraged to drink a lot of fluids. If she is having problems she is to come back. She can use some OTC debrox to try and get the wax out of the left ear.

## 2013-02-27 ENCOUNTER — Ambulatory Visit
Admission: RE | Admit: 2013-02-27 | Discharge: 2013-02-27 | Disposition: A | Discharge status: Home / Self Care | Payer: BC Managed Care – PPO | Source: Ambulatory Visit | Attending: Internal Medicine | Admitting: Internal Medicine

## 2013-02-27 ENCOUNTER — Ambulatory Visit: Payer: BC Managed Care – PPO

## 2013-02-27 ENCOUNTER — Ambulatory Visit (INDEPENDENT_AMBULATORY_CARE_PROVIDER_SITE_OTHER): Payer: BC Managed Care – PPO | Admitting: Internal Medicine

## 2013-02-27 ENCOUNTER — Telehealth: Payer: Self-pay | Admitting: Radiology

## 2013-02-27 VITALS — BP 142/98 | HR 68 | Temp 97.8°F | Resp 18 | Ht 64.0 in | Wt 177.0 lb

## 2013-02-27 DIAGNOSIS — S0093XA Contusion of unspecified part of head, initial encounter: Secondary | ICD-10-CM

## 2013-02-27 DIAGNOSIS — M542 Cervicalgia: Secondary | ICD-10-CM

## 2013-02-27 DIAGNOSIS — S0003XA Contusion of scalp, initial encounter: Secondary | ICD-10-CM

## 2013-02-27 DIAGNOSIS — R42 Dizziness and giddiness: Secondary | ICD-10-CM

## 2013-02-27 NOTE — Progress Notes (Signed)
  Subjective:    Patient ID: Jenny Glass, female    DOB: September 28, 1951, 63 y.o.   MRN: [id number]  HPI At home, mowing lawn with riding lawn mower, got out of control and climbed a fence and was thrown backwards off mower. Landed on back of head and thinks she was loc a few seconds. Has no wound, no signs of bruising, is generalized tender neck and occiput and across entire back. She has no function loss. She is dizzy and moves slower. No vision or sensory changes. No other bone, joint pains. No chest or abdominal sxs or complaints. Memory in tact.  Review of Systems On no meds and no diseases.     Objective:   Physical Exam  Vitals reviewed. Constitutional: She is oriented to person, place, and time. She appears well-developed and well-nourished. She appears distressed.  Cardiovascular: Normal rate, regular rhythm and normal heart sounds.   Pulmonary/Chest: Effort normal and breath sounds normal.  Abdominal: Soft. Bowel sounds are normal. She exhibits no mass. There is no tenderness.  Musculoskeletal: She exhibits tenderness.  Neurological: She is alert and oriented to person, place, and time. She has normal reflexes. No cranial nerve deficit. She exhibits normal muscle tone. Coordination normal.  Skin: Skin is warm, dry and intact.  Psychiatric: Her speech is normal. Judgment and thought content normal. Her mood appears anxious. She is slowed. Cognition and memory are normal.    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

## 2013-02-27 NOTE — Telephone Encounter (Signed)
Call report/ patient has had CT head normal / CT C spine negative for fracture. Patient does have Soft tissue mass ? Lymph node, see full report. Patient advised no acute findings from the injury and we will call next week with a full report. amy

## 2013-02-27 NOTE — Patient Instructions (Addendum)
You can go now for walk in appt at [facility] [address]    Driving directions to [address] [place], [place], [place] [address] [id number]  - more info    [address]  [place], [place] [address]     1. Head south on [address] toward [address]      0.5 mi    2. Sharp left onto [address]      0.6 mi    3. Turn left onto the [address] ramp      0.2 mi    4. Merge onto [address]      3.0 mi    5. Continue straight to stay on [address]      0.4 mi    6. Slight left to stay on [facility]  Destination will be on the right     1.0 mi     [address] [place], [place] [address]

## 2013-02-28 ENCOUNTER — Ambulatory Visit (INDEPENDENT_AMBULATORY_CARE_PROVIDER_SITE_OTHER): Payer: BC Managed Care – PPO | Admitting: Family Medicine

## 2013-02-28 VITALS — BP 120/82 | HR 65 | Temp 97.9°F | Resp 16 | Ht 63.0 in | Wt 176.2 lb

## 2013-02-28 DIAGNOSIS — R9389 Abnormal findings on diagnostic imaging of other specified body structures: Secondary | ICD-10-CM

## 2013-02-28 DIAGNOSIS — K219 Gastro-esophageal reflux disease without esophagitis: Secondary | ICD-10-CM

## 2013-02-28 DIAGNOSIS — S0990XA Unspecified injury of head, initial encounter: Secondary | ICD-10-CM

## 2013-02-28 DIAGNOSIS — J387 Other diseases of larynx: Secondary | ICD-10-CM

## 2013-02-28 DIAGNOSIS — R059 Cough, unspecified: Secondary | ICD-10-CM

## 2013-02-28 DIAGNOSIS — Z5189 Encounter for other specified aftercare: Secondary | ICD-10-CM

## 2013-02-28 DIAGNOSIS — R51 Headache: Secondary | ICD-10-CM

## 2013-02-28 DIAGNOSIS — R05 Cough: Secondary | ICD-10-CM

## 2013-02-28 DIAGNOSIS — S060X9D Concussion with loss of consciousness of unspecified duration, subsequent encounter: Secondary | ICD-10-CM

## 2013-02-28 MED ORDER — OMEPRAZOLE 20 MG PO CPDR: 20.0000 mg | DELAYED_RELEASE_CAPSULE | Freq: Every day | ORAL | Status: DC

## 2013-02-28 NOTE — Progress Notes (Signed)
Subjective:    Patient ID: Jenny Glass, female    DOB: 10/01/1951, 62 y.o.   MRN: [id number]  HPI Jenny Glass is a 62 y.o. female  NO primary provider.   See ov yesterday with Dr. Perrin Maltese - flipped back from lawnmower and hit head and neck, with LOC.  Sent for CT head and neck as below.   CT head:  IMPRESSION:  Normal for age noncontrast appearance of the brain. No acute  traumatic injury identified.  CT cervical spine: IMPRESSION:  Moderate degenerative changes in the cervical spine.  Negative for fracture.  19 x 29 mm soft tissue mass right lower neck, suspicious for  enlarged lymph node. Further evaluation with CT neck with contrast  is suggested. This is not felt to be related to the patient's  acute injury.   Taking tylenol.  Sore all over today, sore in legs, but walking ok.  Sore in front of head, and in back of head where hit ground. No LOC since yesterday, less dizziness, no new visual changes (blurry yesterday, better today), no N/V., no new areas of focal weakness.  Has strength in arms and legs. No hip pain. Does admit to having some frontal headaches before accident. Noticed more since accident.   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  Not taking any acid blockers recently. No heartburn.   Review of Systems  Constitutional: Negative for fever and chills.  Eyes: Negative for photophobia and visual disturbance.  Respiratory: Positive for cough (intermittent with choking sensation. ). Negative for shortness of breath.   Gastrointestinal: Negative for nausea and vomiting.  Musculoskeletal: Positive for myalgias. Negative for arthralgias (denies hip pain, or pain with wt bear. ).  Neurological: Positive for seizures and headaches. Negative for dizziness, facial asymmetry,  weakness and light-headedness.       Objective:   Physical Exam  Vitals reviewed. Constitutional: She is oriented to person, place, and time. She appears well-developed and well-nourished. No distress.  HENT:  Head: Normocephalic and atraumatic.  Right Ear: Hearing, tympanic membrane, external ear and ear canal normal.  Left Ear: Hearing, tympanic membrane, external ear and ear canal normal.  Nose: Nose normal.  Mouth/Throat: Oropharynx is clear and moist. No oropharyngeal exudate.  Eyes: Conjunctivae and EOM are normal. Pupils are equal, round, and reactive to light.  Neck: No thyromegaly present.  Nontender, no apparent LAD or palpable mass appreciated.   Cardiovascular: Normal rate, regular rhythm, normal heart sounds and intact distal pulses.   No murmur heard. Pulmonary/Chest: Effort normal and breath sounds normal. No stridor. No respiratory distress. She has no wheezes. She has no rhonchi.  Musculoskeletal:       Right hip: She exhibits normal range of motion and no bony tenderness.       Left hip: She exhibits normal range of motion and no bony tenderness.  Lymphadenopathy:    She has no cervical adenopathy.  Neurological: She is alert and oriented to person, place, and time. She has normal strength. No sensory deficit. She displays a negative Romberg sign.  No pronator drift. Somewhat unsteady with heel to toe.    Skin: Skin is warm and dry. No rash noted.  Psychiatric: She has a normal mood and affect. Her behavior is normal.  Assessment & Plan:  KRYSTN DERMODY is a 62 y.o. female Head injury, unspecified  Headache, Concussion with loss of consciousness, unspecified duration, subsequent encounter - improving but still some balance difficulty on provocative testing. Discussed initial CT reassuring, but still needs to watch for any worsening of symptoms/head injury precautions below.  Discussed concussion treatment and course and recommended follow up in few days  if not improving. Understanding expressed.  LPRD (laryngopharyngeal reflux disease) - Plan: omeprazole (PRILOSEC) 20 MG capsule - suspected LPR cause of cough,.can try prilosec, trigger avoidance, but recommend ENT eval of choking sensation if not improved in next few weeks.   Abnormal CT scan, neck - discussed neck mass and need for further imaging, including reasons for eval.  Patent and person in room with her refused any further workup stating she didn't need it.  I attempted to answer any questions and clarify concerns of cost of study, but still refused. Advised of risks of not evaluating including possible underlying infection or malignancy and understanding expressed. If she changes her mind, I will order the CT of neck with contrast without office visit needed.   Meds ordered this encounter  Medications  . omeprazole (PRILOSEC) 20 MG capsule    Sig: Take 1 capsule (20 mg total) by mouth daily.    Dispense:  30 capsule    Refill:  0     Patient Instructions  You likely have had a concussion form your head injury yesterday, and the pain in the neck is likely a flair of arthritis.  You can take tylenol as needed for pain for now.  Recheck in next few days if not much improved - return sooner or to an emergency room if any worsening as discussed below for head injuries.   HEAD INJURY If any of the following occur notify your physician or go to the Hospital Emergency Department: . Increased drowsiness, stupor or loss of consciousness . Restlessness or convulsions (fits) . Paralysis in arms or legs . Temperature above 100 F . Vomiting . Severe headache . Blood or clear fluid dripping from the nose or ears . Stiffness of the neck . Dizziness or blurred vision . Pulsating pain in the eye . Unequal pupils of eye . Personality changes . Any other unusual symptoms PRECAUTIONS . Keep head elevated at all times for the first 24 hours (Elevate mattress if pillow is ineffective) . Do  not take tranquilizers, sedatives, narcotics or alcohol . Avoid aspirin. Use only acetaminophen (e.g. Tylenol) or ibuprofen (e.g. Advil) for relief of pain. Follow directions on the bottle for dosage. . Use ice packs for comfort MEDICATIONS Use medications only as directed by your physician    You did have an abnormal mass in the neck that is recommended to have further evaluation. I have copied the notes from the radiologist below. If you would like to proceed with the workup of this as recommended - call and I will be happy to schedule this study.   "19 x 29 mm soft tissue mass right lower neck, suspicious for enlarged lymph node. Further evaluation with CT neck with contrast is suggested."  Your cough and choking sensation may be from acid reflux, even without the sensation of heartburn. You can try omeprazole - 1 per day and avoid spicy foods, caffeine.  If not improving with this in next 2-3 weeks., recommend follow up with ear nose and throat specialist, and I would be happy to refer you if needed.

## 2013-02-28 NOTE — Patient Instructions (Signed)
You likely have had a concussion form your head injury yesterday, and the pain in the neck is likely a flair of arthritis.  You can take tylenol as needed for pain for now.  Recheck in next few days if not much improved - return sooner or to an emergency room if any worsening as discussed below for head injuries.   HEAD INJURY If any of the following occur notify your physician or go to the Hospital Emergency Department: . Increased drowsiness, stupor or loss of consciousness . Restlessness or convulsions (fits) . Paralysis in arms or legs . Temperature above 100 F . Vomiting . Severe headache . Blood or clear fluid dripping from the nose or ears . Stiffness of the neck . Dizziness or blurred vision . Pulsating pain in the eye . Unequal pupils of eye . Personality changes . Any other unusual symptoms PRECAUTIONS . Keep head elevated at all times for the first 24 hours (Elevate mattress if pillow is ineffective) . Do not take tranquilizers, sedatives, narcotics or alcohol . Avoid aspirin. Use only acetaminophen (e.g. Tylenol) or ibuprofen (e.g. Advil) for relief of pain. Follow directions on the bottle for dosage. . Use ice packs for comfort MEDICATIONS Use medications only as directed by your physician    You did have an abnormal mass in the neck that is recommended to have further evaluation. I have copied the notes from the radiologist below. If you would like to proceed with the workup of this as recommended - call and I will be happy to schedule this study.   "19 x 29 mm soft tissue mass right lower neck, suspicious for enlarged lymph node. Further evaluation with CT neck with contrast is suggested."  Your cough and choking sensation may be from acid reflux, even without the sensation of heartburn. You can try omeprazole - 1 per day and avoid spicy foods, caffeine.  If not improving with this in next 2-3 weeks., recommend follow up with ear nose and throat specialist, and I  would be happy to refer you if needed.

## 2013-03-05 NOTE — Telephone Encounter (Signed)
Send copy of ct scan report and have her see her family doctor to follow up on lymph node

## 2013-03-05 NOTE — Telephone Encounter (Signed)
Called her, no answer will try again later.

## 2013-03-06 NOTE — Telephone Encounter (Signed)
Mailed letter to her along with the scan results.

## 2013-03-06 NOTE — Telephone Encounter (Signed)
Patient does not return my calls, have mailed her a copy of the scan with instructions to follow up with her PCP about the Lymph node seen on imaging.

## 2013-03-16 ENCOUNTER — Encounter (HOSPITAL_COMMUNITY): Payer: Self-pay | Admitting: Emergency Medicine

## 2013-03-16 ENCOUNTER — Emergency Department (HOSPITAL_COMMUNITY)
Admission: EM | Admit: 2013-03-16 | Discharge: 2013-03-17 | Disposition: A | Discharge status: Home / Self Care | Payer: BC Managed Care – PPO | Attending: Emergency Medicine | Admitting: Emergency Medicine

## 2013-03-16 DIAGNOSIS — Z87891 Personal history of nicotine dependence: Secondary | ICD-10-CM | POA: Insufficient documentation

## 2013-03-16 DIAGNOSIS — R197 Diarrhea, unspecified: Secondary | ICD-10-CM | POA: Insufficient documentation

## 2013-03-16 DIAGNOSIS — R1110 Vomiting, unspecified: Secondary | ICD-10-CM

## 2013-03-16 DIAGNOSIS — N12 Tubulo-interstitial nephritis, not specified as acute or chronic: Secondary | ICD-10-CM

## 2013-03-16 DIAGNOSIS — R112 Nausea with vomiting, unspecified: Secondary | ICD-10-CM | POA: Insufficient documentation

## 2013-03-16 LAB — LIPASE, BLOOD
Lipase: 71 U/L — ABNORMAL HIGH (ref 11–59)
Lipase: 26 U/L (ref 11–59)

## 2013-03-16 LAB — URINALYSIS, ROUTINE W REFLEX MICROSCOPIC
Glucose, UA: NEGATIVE mg/dL
Ketones, ur: 15 mg/dL — AB
Nitrite: POSITIVE — AB
Protein, ur: 30 mg/dL — AB
Specific Gravity, Urine: 1.033 — ABNORMAL HIGH (ref 1.005–1.030)
Urobilinogen, UA: 0.2 mg/dL (ref 0.0–1.0)
pH: 5 (ref 5.0–8.0)

## 2013-03-16 LAB — COMPREHENSIVE METABOLIC PANEL
ALT: 17 U/L (ref 0–35)
AST: 12 U/L (ref 0–37)
Albumin: 4.2 g/dL (ref 3.5–5.2)
Alkaline Phosphatase: 62 U/L (ref 39–117)
BUN: 18 mg/dL (ref 6–23)
CO2: 24 mEq/L (ref 19–32)
Calcium: 9.3 mg/dL (ref 8.4–10.5)
Chloride: 105 mEq/L (ref 96–112)
Creatinine, Ser: 0.99 mg/dL (ref 0.50–1.10)
GFR calc Af Amer: 70 mL/min — ABNORMAL LOW (ref >90)
GFR calc non Af Amer: 61 mL/min — ABNORMAL LOW (ref >90)
Glucose, Bld: 152 mg/dL — ABNORMAL HIGH (ref 70–99)
Potassium: 4 mEq/L (ref 3.5–5.1)
Sodium: 142 mEq/L (ref 135–145)
Total Bilirubin: 0.5 mg/dL (ref 0.3–1.2)
Total Protein: 6.8 g/dL (ref 6.0–8.3)

## 2013-03-16 LAB — CBC WITH DIFFERENTIAL/PLATELET
Basophils Absolute: 0 10*3/uL (ref 0.0–0.1)
Basophils Relative: 0 % (ref 0–1)
Eosinophils Absolute: 0.1 10*3/uL (ref 0.0–0.7)
Eosinophils Relative: 1 % (ref 0–5)
HCT: 42.9 % (ref 36.0–46.0)
Hemoglobin: 14.8 g/dL (ref 12.0–15.0)
Lymphocytes Relative: 2 % — ABNORMAL LOW (ref 12–46)
Lymphs Abs: 0.3 10*3/uL — ABNORMAL LOW (ref 0.7–4.0)
MCH: 29.8 pg (ref 26.0–34.0)
MCHC: 34.5 g/dL (ref 30.0–36.0)
MCV: 86.3 fL (ref 78.0–100.0)
Monocytes Absolute: 0.5 10*3/uL (ref 0.1–1.0)
Monocytes Relative: 4 % (ref 3–12)
Neutro Abs: 10.3 10*3/uL — ABNORMAL HIGH (ref 1.7–7.7)
Neutrophils Relative %: 93 % — ABNORMAL HIGH (ref 43–77)
Platelets: 228 10*3/uL (ref 150–400)
RBC: 4.97 MIL/uL (ref 3.87–5.11)
RDW: 13.9 % (ref 11.5–15.5)
WBC: 11 10*3/uL — ABNORMAL HIGH (ref 4.0–10.5)

## 2013-03-16 LAB — URINE MICROSCOPIC-ADD ON

## 2013-03-16 MED ORDER — SODIUM CHLORIDE 0.9 % IV BOLUS (SEPSIS)
1000.0000 mL | Freq: Once | INTRAVENOUS | Status: AC
  Administered 2013-03-16: 1000 mL via INTRAVENOUS

## 2013-03-16 MED ORDER — ONDANSETRON 4 MG PO TBDP
8.0000 mg | ORAL_TABLET | Freq: Once | ORAL | Status: AC
  Administered 2013-03-16: 8 mg via ORAL

## 2013-03-16 MED ORDER — CEPHALEXIN 500 MG PO CAPS: 500.0000 mg | ORAL_CAPSULE | Freq: Two times a day (BID) | ORAL | Status: DC

## 2013-03-16 MED ORDER — ONDANSETRON HCL 4 MG/2ML IJ SOLN
4.0000 mg | Freq: Once | INTRAMUSCULAR | Status: AC
  Administered 2013-03-16: 4 mg via INTRAVENOUS
  Filled 2013-03-16: qty 2

## 2013-03-16 MED ORDER — LOPERAMIDE HCL 2 MG PO CAPS: 2.0000 mg | ORAL_CAPSULE | Freq: Four times a day (QID) | ORAL | Status: DC | PRN

## 2013-03-16 MED ORDER — DEXTROSE 5 % IV SOLN
1.0000 g | Freq: Once | INTRAVENOUS | Status: AC
  Administered 2013-03-16: 1 g via INTRAVENOUS
  Filled 2013-03-16: qty 10

## 2013-03-16 MED ORDER — KETOROLAC TROMETHAMINE 30 MG/ML IJ SOLN
30.0000 mg | Freq: Once | INTRAMUSCULAR | Status: AC
  Administered 2013-03-16: 30 mg via INTRAVENOUS
  Filled 2013-03-16: qty 1

## 2013-03-16 MED ORDER — MORPHINE SULFATE 4 MG/ML IJ SOLN
4.0000 mg | Freq: Once | INTRAMUSCULAR | Status: AC
  Administered 2013-03-16: 4 mg via INTRAVENOUS
  Filled 2013-03-16: qty 1

## 2013-03-16 NOTE — ED Notes (Signed)
Pt. Was unable to use the bathroom to give a urin

## 2013-03-16 NOTE — ED Notes (Signed)
Pt updated on care 

## 2013-03-16 NOTE — ED Notes (Signed)
Pt c.o N/V/D and abd pain starting this am

## 2013-03-17 NOTE — ED Notes (Signed)
Rx x 2.  Pt voiced understanding to f/u with PCP and return for worsening condition.  

## 2013-03-17 NOTE — ED Provider Notes (Signed)
History     CSN: [id number]  Arrival date & time 03/16/13  1615   First MD Initiated Contact with Patient 03/16/13 1950      Chief Complaint  Patient presents with  . Abdominal Pain  . Emesis    HPI 62 yo F presents complaining of vomiting, abdominal pain, and flank pain. - Onset this morning.  Pain is located to LLQ of her abdomen and radiates to left low back.  Pain is constant, moderate in severity, aggravated by prolonged standing and certain positions, and relieved by laying flat on her back and being still.  It has been associated with non-bloody and non-bilious emesis, approximately 10 times.  Additionally, she has had watery diarrhea more than 10 times today.  She has had urinary frequency but no dysuria.  She has had diffuse myalgias.  No fever, though she has had chills.  No history of kidney stones.  No recent antibiotic use or hospitalizations.     History reviewed. No pertinent past medical history.  History reviewed. No pertinent past surgical history.  Family History  Problem Relation Age of Onset  . Cancer Mother   . Cancer Father     History  Substance Use Topics  . Smoking status: Former Games developer  . Smokeless tobacco: Not on file  . Alcohol Use: No    OB History   Grav Para Term Preterm Abortions TAB SAB Ect Mult Living                  Review of Systems  Constitutional: Positive for chills and fatigue. Negative for fever and diaphoresis.  HENT: Negative for congestion, rhinorrhea, neck pain and neck stiffness.   Respiratory: Negative for cough, shortness of breath and wheezing.   Cardiovascular: Negative for chest pain and leg swelling.  Gastrointestinal: Positive for nausea, vomiting, abdominal pain and diarrhea.  Genitourinary: Positive for frequency and flank pain. Negative for dysuria, urgency, vaginal bleeding, vaginal discharge and difficulty urinating.  Musculoskeletal: Positive for back pain.  Skin: Negative for rash.  Neurological:  Negative for weakness, numbness and headaches.  All other systems reviewed and are negative.    Allergies  Review of patient's allergies indicates no known allergies.  Home Medications   Current Outpatient Rx  Name  Route  Sig  Dispense  Refill  . Acetaminophen (TYLENOL PO)   Oral   Take 2 tablets by mouth every 6 (six) hours as needed (for pain).         . cephALEXin (KEFLEX) 500 MG capsule   Oral   Take 1 capsule (500 mg total) by mouth 2 (two) times daily. Take 1 capsule (50 mg total) by mouth 2 times daily for 14 days.   28 capsule   0   . loperamide (IMODIUM) 2 MG capsule   Oral   Take 1 capsule (2 mg total) by mouth 4 (four) times daily as needed for diarrhea or loose stools.   12 capsule   0     BP 91/46  Pulse 74  Temp(Src) 98.1 F (36.7 C) (Oral)  Resp 15  SpO2 94%  Physical Exam  Nursing note and vitals reviewed. Constitutional: She is oriented to person, place, and time. She appears well-developed and well-nourished. No distress.  Ill but non-toxic appearing  HENT:  Head: Normocephalic and atraumatic.  Mouth/Throat: Oropharynx is clear and moist.  Eyes: Conjunctivae and EOM are normal. Pupils are equal, round, and reactive to light. No scleral icterus.  Neck: Normal range of  motion. Neck supple. No JVD present.  Cardiovascular: Normal rate, regular rhythm, normal heart sounds and intact distal pulses.  Exam reveals no gallop and no friction rub.   No murmur heard. Pulmonary/Chest: Effort normal and breath sounds normal. No respiratory distress. She has no wheezes. She has no rales.  Abdominal: Soft. Normal appearance and bowel sounds are normal. She exhibits no distension. There is no tenderness. There is no rigidity, no rebound, no guarding, no tenderness at McBurney's point and negative Murphy's sign.  Musculoskeletal: She exhibits no edema.  Neurological: She is alert and oriented to person, place, and time. No cranial nerve deficit. She exhibits  normal muscle tone. Coordination normal.  Skin: Skin is warm and dry. She is not diaphoretic.    ED Course  Procedures (including critical care time)  Labs Reviewed  CBC WITH DIFFERENTIAL - Abnormal; Notable for the following:    WBC 11.0 (*)    Neutrophils Relative 93 (*)    Neutro Abs 10.3 (*)    Lymphocytes Relative 2 (*)    Lymphs Abs 0.3 (*)    All other components within normal limits  COMPREHENSIVE METABOLIC PANEL - Abnormal; Notable for the following:    Glucose, Bld 152 (*)    GFR calc non Af Amer 61 (*)    GFR calc Af Amer 70 (*)    All other components within normal limits  LIPASE, BLOOD - Abnormal; Notable for the following:    Lipase 71 (*)    All other components within normal limits  URINALYSIS, ROUTINE W REFLEX MICROSCOPIC - Abnormal; Notable for the following:    Color, Urine AMBER (*)    APPearance TURBID (*)    Specific Gravity, Urine 1.033 (*)    Hgb urine dipstick LARGE (*)    Bilirubin Urine SMALL (*)    Ketones, ur 15 (*)    Protein, ur 30 (*)    Nitrite POSITIVE (*)    Leukocytes, UA MODERATE (*)    All other components within normal limits  URINE MICROSCOPIC-ADD ON - Abnormal; Notable for the following:    Squamous Epithelial / LPF FEW (*)    Bacteria, UA MANY (*)    Casts HYALINE CASTS (*)    All other components within normal limits  URINE CULTURE  LIPASE, BLOOD   No results found.   1. Pyelonephritis   2. Vomiting and diarrhea       MDM  62 yo F, presviously healthy, presents with one day of nausea, vomiting, diarrhea, flank pain, back pain, and myalgias.  She is afebrile, borderline hypotensive to 90/68, tachycardic to the 110 range, otherwise normal vitals; she appears ill but non-toxic.  In NAD.  Dry mucus membranes.  Exam otherwise remarkable only for LLQ abd tenderness but with no rebound or guarding; mild L CVA TTP.    Her labs demonstrate mild leukocytosis, normal creatinine and electrolytes, lipase 71, UA with ketones, +  nitrite, moderate leukocytes, many bacteria.  Urine culture sent.    Given obvious UTI + significant systemic symptoms, will treat as pyelonephritis.  Gave IV rocephin dose in ED.  Gave 2 L IVF bolus with improvement of BP to 110s and resolution of tachycardia.  Treated pain with morphine, toradol; gave zofran.  On reevaluation, patient looks and feels much better.  There is likely also an element of gastroenteritis with dehydration given the degree of diarrhea she has had.  She is discharged with RX for 14 days of Keflex as well as RX  for immodium.  She is given return precautions.  She is to f/u with PCP in 2 days for reevaluation.     Toney Sang, MD 03/17/13 1209  I performed a history and physical examination of  Jenny Glass and discussed her management with Dr. Peyton Najjar and agree with the history, physical, assessment, and plan of care, with the following exceptions: None I was present for the following procedures: None  Time Spent in Critical Care of the patient: None  Time spent in discussions with the patient and family: 10 minutes  Pt has URI like sx, with some nausea, back pain - tx is as pyelonephritis. No need for admission. Derwood Kaplan  Derwood Kaplan, MD 03/23/13 [phone]

## 2013-03-18 LAB — URINE CULTURE: Colony Count: >=100000

## 2013-03-20 NOTE — ED Notes (Signed)
Post ED Visit - Positive Culture Follow-up  Culture report reviewed by antimicrobial stewardship pharmacist: []  Wes Dulaney, Pharm.D., BCPS [x]  Celedonio Miyamoto, Pharm.D., BCPS []  Georgina Pillion, Pharm.D., BCPS []  [place], [place].D., BCPS, AAHIVP []  Estella Husk, Pharm.D., BCPS, AAHIV  Positive urine culture Treated with Cephalexin, organism sensitive to the same and no further patient follow-up is required at this time.  Larena Sox 03/20/2013, 6:36 PM

## 2013-05-31 ENCOUNTER — Ambulatory Visit (INDEPENDENT_AMBULATORY_CARE_PROVIDER_SITE_OTHER): Payer: BC Managed Care – PPO | Admitting: Physician Assistant

## 2013-05-31 VITALS — BP 118/76 | HR 69 | Temp 98.0°F | Resp 18 | Ht 63.0 in | Wt 176.8 lb

## 2013-05-31 DIAGNOSIS — M545 Low back pain, unspecified: Secondary | ICD-10-CM

## 2013-05-31 DIAGNOSIS — R109 Unspecified abdominal pain: Secondary | ICD-10-CM

## 2013-05-31 DIAGNOSIS — R350 Frequency of micturition: Secondary | ICD-10-CM

## 2013-05-31 LAB — POCT UA - MICROSCOPIC ONLY
Casts, Ur, LPF, POC: NEGATIVE
Crystals, Ur, HPF, POC: NEGATIVE
Mucus, UA: NEGATIVE
RBC, urine, microscopic: NEGATIVE
Yeast, UA: NEGATIVE

## 2013-05-31 LAB — POCT URINALYSIS DIPSTICK
Bilirubin, UA: NEGATIVE
Glucose, UA: NEGATIVE
Ketones, UA: NEGATIVE
Nitrite, UA: POSITIVE
Protein, UA: NEGATIVE
Spec Grav, UA: 1.01
Urobilinogen, UA: 0.2
pH, UA: 5.5

## 2013-05-31 LAB — POCT CBC
Granulocyte percent: 59.4 %G (ref 37–80)
HCT, POC: 38.6 % (ref 37.7–47.9)
Hemoglobin: 11.7 g/dL — AB (ref 12.2–16.2)
Lymph, poc: 2.3 (ref 0.6–3.4)
MCH, POC: 28.7 pg (ref 27–31.2)
MCHC: 30.3 g/dL — AB (ref 31.8–35.4)
MCV: 94.9 fL (ref 80–97)
MID (cbc): 0.6 (ref 0–0.9)
MPV: 10.5 fL (ref 0–99.8)
POC Granulocyte: 4.3 (ref 2–6.9)
POC LYMPH PERCENT: 32 %L (ref 10–50)
POC MID %: 8.6 %M (ref 0–12)
Platelet Count, POC: 193 10*3/uL (ref 142–424)
RBC: 4.07 M/uL (ref 4.04–5.48)
RDW, POC: 15.7 %
WBC: 7.3 10*3/uL (ref 4.6–10.2)

## 2013-05-31 MED ORDER — CIPROFLOXACIN HCL 500 MG PO TABS: 500.0000 mg | ORAL_TABLET | Freq: Two times a day (BID) | ORAL | Status: DC

## 2013-05-31 NOTE — Progress Notes (Signed)
Subjective:    Patient ID: Jenny Glass, female    DOB: 19-Aug-1951, 62 y.o.   MRN: [id number]  HPI 62 year old female presents with 4-5 day history of lower abdominal pain, suprapubic pressure, and lower back pain.  Also complains of subjective fever, chills, and body aches.  Admits to an episode of similar sx's in 03/2013 treated by the ED with Keflex.  She reports improvement of sx's after this course of treatment.  Had a positive urine culture revealing E. Coli.  Admits it has been "years" since she has had a UTI prior to this most recent episode.  Says the symptoms she is experiencing now are similar to what she had in May. Does have a hx of back pain treated by an orthopedist but she describes this as different. Has nausea without vomiting.   Otherwise doing well with no other concerns today.     Review of Systems  Constitutional: Positive for fever (subjective) and chills.  Gastrointestinal: Positive for nausea and abdominal pain (suprapubic and LLQ). Negative for vomiting.  Genitourinary: Positive for frequency and flank pain. Negative for dysuria.  Neurological: Negative for dizziness and headaches.       Objective:   Physical Exam  Constitutional: She is oriented to person, place, and time. She appears well-developed and well-nourished.  HENT:  Head: Normocephalic and atraumatic.  Right Ear: External ear normal.  Left Ear: External ear normal.  Eyes: Conjunctivae are normal.  Neck: Normal range of motion.  Cardiovascular: Normal rate.   Pulmonary/Chest: Effort normal.  Abdominal: Soft. Bowel sounds are normal. There is tenderness (suprapubic and LLQ). There is no rebound, no guarding and no CVA tenderness.  Neurological: She is alert and oriented to person, place, and time.  Psychiatric: She has a normal mood and affect. Her behavior is normal. Judgment and thought content normal.      Results for orders placed in visit on 05/31/13  POCT URINALYSIS DIPSTICK      Result  Value Range   Color, UA yellow     Clarity, UA clear     Glucose, UA neg     Bilirubin, UA neg     Ketones, UA neg     Spec Grav, UA 1.010     Blood, UA trace-lysed     pH, UA 5.5     Protein, UA neg     Urobilinogen, UA 0.2     Nitrite, UA positive     Leukocytes, UA moderate (2+)    POCT UA - MICROSCOPIC ONLY      Result Value Range   WBC, Ur, HPF, POC 10-15     RBC, urine, microscopic neg     Bacteria, U Microscopic 2+     Mucus, UA neg     Epithelial cells, urine per micros 0-2     Crystals, Ur, HPF, POC neg     Casts, Ur, LPF, POC neg     Yeast, UA neg    POCT CBC      Result Value Range   WBC 7.3  4.6 - 10.2 K/uL   Lymph, poc 2.3  0.6 - 3.4   POC LYMPH PERCENT 32.0  10 - 50 %L   MID (cbc) 0.6  0 - 0.9   POC MID % 8.6  0 - 12 %M   POC Granulocyte 4.3  2 - 6.9   Granulocyte percent 59.4  37 - 80 %G   RBC 4.07  4.04 - 5.48 M/uL  Hemoglobin 11.7 (*) 12.2 - 16.2 g/dL   HCT, POC 09.8  11.9 - 47.9 %   MCV 94.9  80 - 97 fL   MCH, POC 28.7  27 - 31.2 pg   MCHC 30.3 (*) 31.8 - 35.4 g/dL   RDW, POC 14.7     Platelet Count, POC 193  142 - 424 K/uL   MPV 10.5  0 - 99.8 fL       Assessment & Plan:  Frequency of urination - Plan: POCT urinalysis dipstick, POCT UA - Microscopic Only, POCT CBC, Urine culture, ciprofloxacin (CIPRO) 500 MG tablet  Lower back pain - Plan: POCT urinalysis dipstick, POCT UA - Microscopic Only  Abdominal pain, unspecified site - Plan: POCT CBC  Urine culture sent Start Cipro 500 mg bid x 7 days Increase fluids and rest Return in 48-72 hours if no improvement, sooner if worse.

## 2013-06-01 ENCOUNTER — Emergency Department (HOSPITAL_COMMUNITY)
Admission: EM | Admit: 2013-06-01 | Discharge: 2013-06-01 | Disposition: A | Discharge status: Home / Self Care | Payer: BC Managed Care – PPO | Attending: Emergency Medicine | Admitting: Emergency Medicine

## 2013-06-01 ENCOUNTER — Encounter (HOSPITAL_COMMUNITY): Payer: Self-pay | Admitting: *Deleted

## 2013-06-01 DIAGNOSIS — G8929 Other chronic pain: Secondary | ICD-10-CM | POA: Insufficient documentation

## 2013-06-01 DIAGNOSIS — Z7982 Long term (current) use of aspirin: Secondary | ICD-10-CM | POA: Insufficient documentation

## 2013-06-01 DIAGNOSIS — K5900 Constipation, unspecified: Secondary | ICD-10-CM | POA: Insufficient documentation

## 2013-06-01 DIAGNOSIS — M6281 Muscle weakness (generalized): Secondary | ICD-10-CM | POA: Insufficient documentation

## 2013-06-01 DIAGNOSIS — Z87440 Personal history of urinary (tract) infections: Secondary | ICD-10-CM | POA: Insufficient documentation

## 2013-06-01 DIAGNOSIS — Z79899 Other long term (current) drug therapy: Secondary | ICD-10-CM | POA: Insufficient documentation

## 2013-06-01 DIAGNOSIS — Z87891 Personal history of nicotine dependence: Secondary | ICD-10-CM | POA: Insufficient documentation

## 2013-06-01 DIAGNOSIS — M545 Low back pain, unspecified: Secondary | ICD-10-CM

## 2013-06-01 DIAGNOSIS — E669 Obesity, unspecified: Secondary | ICD-10-CM | POA: Insufficient documentation

## 2013-06-01 DIAGNOSIS — R350 Frequency of micturition: Secondary | ICD-10-CM | POA: Insufficient documentation

## 2013-06-01 DIAGNOSIS — R1084 Generalized abdominal pain: Secondary | ICD-10-CM | POA: Insufficient documentation

## 2013-06-01 DIAGNOSIS — R110 Nausea: Secondary | ICD-10-CM | POA: Insufficient documentation

## 2013-06-01 HISTORY — DX: Dorsalgia, unspecified: M54.9

## 2013-06-01 HISTORY — DX: Other chronic pain: G89.29

## 2013-06-01 MED ORDER — ONDANSETRON HCL 8 MG PO TABS: 8.0000 mg | ORAL_TABLET | Freq: Three times a day (TID) | ORAL | Status: DC | PRN

## 2013-06-01 MED ORDER — HYDROMORPHONE HCL PF 1 MG/ML IJ SOLN
1.0000 mg | Freq: Once | INTRAMUSCULAR | Status: AC
  Administered 2013-06-01: 1 mg via INTRAMUSCULAR
  Filled 2013-06-01: qty 1

## 2013-06-01 MED ORDER — KETOROLAC TROMETHAMINE 60 MG/2ML IM SOLN
60.0000 mg | Freq: Once | INTRAMUSCULAR | Status: AC
  Administered 2013-06-01: 60 mg via INTRAMUSCULAR
  Filled 2013-06-01: qty 2

## 2013-06-01 MED ORDER — OXYCODONE-ACETAMINOPHEN 5-325 MG PO TABS: 1.0000 | ORAL_TABLET | ORAL | Status: DC | PRN

## 2013-06-01 NOTE — ED Provider Notes (Signed)
Medical screening examination/treatment/procedure(s) were performed by non-physician practitioner and as supervising physician I was immediately available for consultation/collaboration.  Sunnie Nielsen, MD 06/01/13 [phone]

## 2013-06-01 NOTE — ED Notes (Signed)
Pt states understanding of discharge instructions 

## 2013-06-01 NOTE — ED Provider Notes (Signed)
CSN: [id number]     Arrival date & time 06/01/13  0330 History     First MD Initiated Contact with Patient 06/01/13 [phone]     Chief Complaint  Patient presents with  . Abdominal Pain   (Consider location/radiation/quality/duration/timing/severity/associated sxs/prior Treatment) HPI History provided by pt.   Pt a poor historian.  Presents w/ 2 days severe, diffuse low back pain.  Associated w/ achiness and weakness bilateral LEs and is aggravated by walking.  No relief w/ gabapentin and vicodin.  These sx are chronic x several months and are typical w/ exception of increased intensity.  Denies recent trauma.  Denies fever.  Has not had a BM in 1 week but does not have any urge to, constipation chronic and she is on opiates.  Has has pain across lower abd, nausea urinary frequency and dribbling following urination, but is currently being treated w/ cipro for UTI, diagnosed at [facility] yesterday.  Has had an MRI in the recent past and is followed closely by [facility].  Has an appointment w/ ortho this week for an injection.  No past medical history on file. No past surgical history on file. Family History  Problem Relation Age of Onset  . Cancer Mother   . Cancer Father    History  Substance Use Topics  . Smoking status: Former Games developer  . Smokeless tobacco: Not on file  . Alcohol Use: No   OB History   Grav Para Term Preterm Abortions TAB SAB Ect Mult Living                 Review of Systems  All other systems reviewed and are negative.    Allergies  Review of patient's allergies indicates no known allergies.  Home Medications   Current Outpatient Rx  Name  Route  Sig  Dispense  Refill  . Ascorbic Acid (VITAMIN C PO)   Oral   Take 1 tablet by mouth daily.         Marland Kitchen aspirin EC 81 MG tablet   Oral   Take 81 mg by mouth daily.         . ciprofloxacin (CIPRO) 500 MG tablet   Oral   Take 1 tablet (500 mg total) by mouth 2 (two) times daily.   14 tablet   0   . Cyanocobalamin (VITAMIN B-12 CR PO)   Oral   Take 1 tablet by mouth daily.         . Flaxseed, Linseed, (FLAX SEED OIL PO)   Oral   Take 1 capsule by mouth daily.         Marland Kitchen gabapentin (NEURONTIN) 300 MG capsule   Oral   Take 300 mg by mouth 2 (two) times daily.         Marland Kitchen GARLIC PO   Oral   Take 1 tablet by mouth daily.         Marland Kitchen HYDROcodone-acetaminophen (NORCO) 10-325 MG per tablet   Oral   Take 1-2 tablets by mouth every 6 (six) hours as needed for pain.         Marland Kitchen omega-3 acid ethyl esters (LOVAZA) 1 G capsule   Oral   Take 2 g by mouth daily.         . vitamin E 100 UNIT capsule   Oral   Take 100 Units by mouth daily.          BP 138/84  Pulse 73  Temp(Src) 98.2 F (36.8  C) (Oral)  Resp 17  SpO2 95% Physical Exam  Nursing note and vitals reviewed. Constitutional: She is oriented to person, place, and time. She appears well-developed and well-nourished.  Uncomfortable appearing; sitting on edge of bed.    HENT:  Head: Normocephalic and atraumatic.  Eyes:  Normal appearance  Neck: Normal range of motion.  Cardiovascular: Normal rate and regular rhythm.   Pulmonary/Chest: Effort normal and breath sounds normal.  Abdominal: Soft. Bowel sounds are normal. She exhibits no distension.  Obese.  Mild, diffuse ttp.   Genitourinary:  Bilateral CVA ttp  Musculoskeletal:  Mild tenderness at approx L1-L2 as well as bilateral lumbar musculature. Full active ROM of LE.  5/5 hip abduction/adduction and ankle plantar/dorsiflexion strength.  Nml patellar reflexes.  No saddle anesthesia. Distal sensation intact.  2+ DP pulses.  Ambulates w/out diffulty.   Neurological: She is alert and oriented to person, place, and time.  Skin: Skin is warm and dry. No rash noted.  Psychiatric: She has a normal mood and affect. Her behavior is normal.    ED Course   Procedures (including critical care time)  Labs Reviewed - No data to display No results found. 1. Low  back pain     MDM  62yo F w/ chronic low back pain presents w/ non-traumatic, severe, diffuse low back pain x 2d.  Pain typical w/ exception of intensity.  Followed closely by [facility], had recent MRI lumbar spine and has an appt this week for injection of some sort.  Has h/o pyelonephritis in 03/2013 and has taken two doses of cipro for UTI diagnosed at urgent care yesterday.  Afebrile, uncomfortable appearing, bony tenderness at L1-L2 but no worse than soft tissues, no NV deficits LEs, ambulatory, abd soft/non-distended and diffusely, mildly ttp, bilateral CVA ttp on exam.  Patient's symptoms may be an acute exacerbation of her chronic back pain or less likely, pyelonephritis.  She is on an appropriate abx (pending culture). Pain improved w/ IM toradol and dilaudid.  She has f/u scheduled.  I will refer to pain management as well.  Return precautions, including fever, uncontrolled vomiting, worsening abd pain, inability to ambulate d/t weakness/numbness, loss of control of bladder/bowels discussed.    Otilio Miu, PA-C 06/01/13 [phone]

## 2013-06-01 NOTE — ED Notes (Signed)
Pt c/o of lower abdominal, lower back pain, bilateral lower extremity pain. LBM was a week ago. Denies difficulty voiding.

## 2013-06-03 LAB — URINE CULTURE: Colony Count: >=100000

## 2013-06-05 ENCOUNTER — Emergency Department (HOSPITAL_COMMUNITY): Payer: BC Managed Care – PPO

## 2013-06-05 ENCOUNTER — Encounter (HOSPITAL_COMMUNITY): Payer: Self-pay | Admitting: *Deleted

## 2013-06-05 ENCOUNTER — Emergency Department (HOSPITAL_COMMUNITY)
Admission: EM | Admit: 2013-06-05 | Discharge: 2013-06-05 | Disposition: A | Discharge status: Home / Self Care | Payer: BC Managed Care – PPO | Attending: Emergency Medicine | Admitting: Emergency Medicine

## 2013-06-05 DIAGNOSIS — R0602 Shortness of breath: Secondary | ICD-10-CM | POA: Insufficient documentation

## 2013-06-05 DIAGNOSIS — K5732 Diverticulitis of large intestine without perforation or abscess without bleeding: Secondary | ICD-10-CM

## 2013-06-05 DIAGNOSIS — Z87440 Personal history of urinary (tract) infections: Secondary | ICD-10-CM | POA: Insufficient documentation

## 2013-06-05 DIAGNOSIS — Z7982 Long term (current) use of aspirin: Secondary | ICD-10-CM | POA: Insufficient documentation

## 2013-06-05 DIAGNOSIS — Z79899 Other long term (current) drug therapy: Secondary | ICD-10-CM | POA: Insufficient documentation

## 2013-06-05 DIAGNOSIS — Z87891 Personal history of nicotine dependence: Secondary | ICD-10-CM | POA: Insufficient documentation

## 2013-06-05 DIAGNOSIS — Z8739 Personal history of other diseases of the musculoskeletal system and connective tissue: Secondary | ICD-10-CM | POA: Insufficient documentation

## 2013-06-05 DIAGNOSIS — K5900 Constipation, unspecified: Secondary | ICD-10-CM | POA: Insufficient documentation

## 2013-06-05 DIAGNOSIS — R112 Nausea with vomiting, unspecified: Secondary | ICD-10-CM | POA: Insufficient documentation

## 2013-06-05 DIAGNOSIS — Z792 Long term (current) use of antibiotics: Secondary | ICD-10-CM | POA: Insufficient documentation

## 2013-06-05 HISTORY — DX: Urinary tract infection, site not specified: N39.0

## 2013-06-05 LAB — URINALYSIS, ROUTINE W REFLEX MICROSCOPIC
Bilirubin Urine: NEGATIVE
Glucose, UA: NEGATIVE mg/dL
Hgb urine dipstick: NEGATIVE
Ketones, ur: 15 mg/dL — AB
Nitrite: NEGATIVE
Protein, ur: NEGATIVE mg/dL
Specific Gravity, Urine: 1.013 (ref 1.005–1.030)
Urobilinogen, UA: 0.2 mg/dL (ref 0.0–1.0)
pH: 7.5 (ref 5.0–8.0)

## 2013-06-05 LAB — CBC WITH DIFFERENTIAL/PLATELET
Basophils Absolute: 0 10*3/uL (ref 0.0–0.1)
Basophils Relative: 0 % (ref 0–1)
Eosinophils Absolute: 0.1 10*3/uL (ref 0.0–0.7)
Eosinophils Relative: 1 % (ref 0–5)
HCT: 36.7 % (ref 36.0–46.0)
Hemoglobin: 12.3 g/dL (ref 12.0–15.0)
Lymphocytes Relative: 13 % (ref 12–46)
Lymphs Abs: 1.3 10*3/uL (ref 0.7–4.0)
MCH: 29.8 pg (ref 26.0–34.0)
MCHC: 33.5 g/dL (ref 30.0–36.0)
MCV: 88.9 fL (ref 78.0–100.0)
Monocytes Absolute: 1.1 10*3/uL — ABNORMAL HIGH (ref 0.1–1.0)
Monocytes Relative: 11 % (ref 3–12)
Neutro Abs: 7.6 10*3/uL (ref 1.7–7.7)
Neutrophils Relative %: 75 % (ref 43–77)
Platelets: 190 10*3/uL (ref 150–400)
RBC: 4.13 MIL/uL (ref 3.87–5.11)
RDW: 14.8 % (ref 11.5–15.5)
WBC: 10.2 10*3/uL (ref 4.0–10.5)

## 2013-06-05 LAB — BASIC METABOLIC PANEL
BUN: 8 mg/dL (ref 6–23)
CO2: 26 mEq/L (ref 19–32)
Calcium: 9 mg/dL (ref 8.4–10.5)
Chloride: 102 mEq/L (ref 96–112)
Creatinine, Ser: 0.79 mg/dL (ref 0.50–1.10)
GFR calc Af Amer: >90 mL/min (ref >90)
GFR calc non Af Amer: 89 mL/min — ABNORMAL LOW (ref >90)
Glucose, Bld: 103 mg/dL — ABNORMAL HIGH (ref 70–99)
Potassium: 4.9 mEq/L (ref 3.5–5.1)
Sodium: 137 mEq/L (ref 135–145)

## 2013-06-05 LAB — URINE MICROSCOPIC-ADD ON

## 2013-06-05 MED ORDER — MORPHINE SULFATE 4 MG/ML IJ SOLN
4.0000 mg | Freq: Once | INTRAMUSCULAR | Status: AC
  Administered 2013-06-05: 4 mg via INTRAVENOUS
  Filled 2013-06-05: qty 1

## 2013-06-05 MED ORDER — CIPROFLOXACIN HCL 500 MG PO TABS: 500.0000 mg | ORAL_TABLET | Freq: Two times a day (BID) | ORAL | Status: DC

## 2013-06-05 MED ORDER — IOHEXOL 300 MG/ML  SOLN
25.0000 mL | INTRAMUSCULAR | Status: AC
  Administered 2013-06-05: 25 mL via ORAL

## 2013-06-05 MED ORDER — METRONIDAZOLE 500 MG PO TABS: 500.0000 mg | ORAL_TABLET | Freq: Three times a day (TID) | ORAL | Status: DC

## 2013-06-05 MED ORDER — OXYCODONE-ACETAMINOPHEN 5-325 MG PO TABS: 1.0000 | ORAL_TABLET | ORAL | Status: DC | PRN

## 2013-06-05 MED ORDER — IOHEXOL 300 MG/ML  SOLN
80.0000 mL | Freq: Once | INTRAMUSCULAR | Status: AC | PRN
  Administered 2013-06-05: 80 mL via INTRAVENOUS

## 2013-06-05 MED ORDER — ONDANSETRON HCL 4 MG PO TABS: 4.0000 mg | ORAL_TABLET | Freq: Four times a day (QID) | ORAL | Status: DC

## 2013-06-05 MED ORDER — ONDANSETRON HCL 4 MG/2ML IJ SOLN
4.0000 mg | Freq: Once | INTRAMUSCULAR | Status: AC
  Administered 2013-06-05: 4 mg via INTRAVENOUS
  Filled 2013-06-05: qty 2

## 2013-06-05 MED ORDER — SODIUM CHLORIDE 0.9 % IV SOLN
Freq: Once | INTRAVENOUS | Status: AC
  Administered 2013-06-05: 12:00:00 via INTRAVENOUS

## 2013-06-05 NOTE — ED Notes (Addendum)
Medicated for pain 10/10 in abdomen. Continuous pulse ox on patient. Call bell in reach, bedrails up x 2.

## 2013-06-05 NOTE — ED Provider Notes (Signed)
CSN: [id number]     Arrival date & time 06/05/13  1021 History     First MD Initiated Contact with Patient 06/05/13 1030     Chief Complaint  Patient presents with  . Abdominal Pain  . Shortness of Breath   (Consider location/radiation/quality/duration/timing/severity/associated sxs/prior Treatment) Patient is a 62 y.o. female presenting with abdominal pain and shortness of breath. The history is provided by the patient. No language interpreter was used.  Abdominal Pain This is a new problem. The current episode started yesterday. The problem occurs constantly. The problem has been gradually worsening. Associated symptoms include abdominal pain, nausea and vomiting. Pertinent negatives include no chills or fever. Associated symptoms comments: She reports increased constipation with last "good" bowel movement one week ago. She is having pain that is worsening over time. No blood per rectum. No fever. She reports nausea with vomiting with any attempt at PO intake. She is currently being treated for a kidney infection with an antibiotic and pain medication but reports infrequent use of the pain medicine. No urinary symptoms or vaginal discharge. She feels like she has to have a bowel movement but reports that nothing has made her bowels move in several days. .  Shortness of Breath Associated symptoms: abdominal pain and vomiting   Associated symptoms: no fever     Past Medical History  Diagnosis Date  . Chronic back pain   . UTI (lower urinary tract infection)    No past surgical history on file. Family History  Problem Relation Age of Onset  . Cancer Mother   . Cancer Father    History  Substance Use Topics  . Smoking status: Former Games developer  . Smokeless tobacco: Not on file  . Alcohol Use: No   OB History   Grav Para Term Preterm Abortions TAB SAB Ect Mult Living                 Review of Systems  Constitutional: Negative for fever and chills.  HENT: Negative.   Respiratory:  Positive for shortness of breath.   Cardiovascular: Negative.   Gastrointestinal: Positive for nausea, vomiting, abdominal pain and constipation. Negative for blood in stool.  Genitourinary: Negative.  Negative for dysuria and flank pain.  Musculoskeletal: Negative.   Skin: Negative.   Neurological: Negative.     Allergies  Review of patient's allergies indicates no known allergies.  Home Medications   Current Outpatient Rx  Name  Route  Sig  Dispense  Refill  . Ascorbic Acid (VITAMIN C PO)   Oral   Take 1 tablet by mouth daily.         Marland Kitchen aspirin EC 81 MG tablet   Oral   Take 81 mg by mouth daily.         . ciprofloxacin (CIPRO) 500 MG tablet   Oral   Take 1 tablet (500 mg total) by mouth 2 (two) times daily.   14 tablet   0   . Cyanocobalamin (VITAMIN B-12 CR PO)   Oral   Take 1 tablet by mouth daily.         . Flaxseed, Linseed, (FLAX SEED OIL PO)   Oral   Take 1 capsule by mouth daily.         Marland Kitchen gabapentin (NEURONTIN) 300 MG capsule   Oral   Take 300 mg by mouth 2 (two) times daily.         Marland Kitchen GARLIC PO   Oral   Take 1 tablet by  mouth daily.         Marland Kitchen omega-3 acid ethyl esters (LOVAZA) 1 G capsule   Oral   Take 2 g by mouth daily.         Marland Kitchen oxyCODONE-acetaminophen (PERCOCET/ROXICET) 5-325 MG per tablet   Oral   Take 1 tablet by mouth every 4 (four) hours as needed for pain.   20 tablet   0   . sodium phosphate (FLEET) enema   Rectal   Place 1 enema rectally once. follow package directions         . vitamin E 100 UNIT capsule   Oral   Take 100 Units by mouth daily.         . ondansetron (ZOFRAN) 8 MG tablet   Oral   Take 1 tablet (8 mg total) by mouth every 8 (eight) hours as needed for nausea.   20 tablet   0    There were no vitals taken for this visit. Physical Exam  Constitutional: She appears well-developed and well-nourished.  HENT:  Head: Normocephalic.  Neck: Normal range of motion. Neck supple.  Cardiovascular:  Normal rate and regular rhythm.   Pulmonary/Chest: Effort normal and breath sounds normal.  Abdominal: Soft. Bowel sounds are normal. There is tenderness. There is no rebound and no guarding.  Abdomen is diffusely tender but has greater tenderness across lower abdomen, left greater than right.   Musculoskeletal: Normal range of motion.  Neurological: She is alert. No cranial nerve deficit.  Skin: Skin is warm and dry. No rash noted.  Psychiatric: She has a normal mood and affect.    ED Course   Procedures (including critical care time) Results for orders placed during the hospital encounter of 06/05/13  CBC WITH DIFFERENTIAL      Result Value Range   WBC 10.2  4.0 - 10.5 K/uL   RBC 4.13  3.87 - 5.11 MIL/uL   Hemoglobin 12.3  12.0 - 15.0 g/dL   HCT 78.2  95.6 - 21.3 %   MCV 88.9  78.0 - 100.0 fL   MCH 29.8  26.0 - 34.0 pg   MCHC 33.5  30.0 - 36.0 g/dL   RDW 08.6  57.8 - 46.9 %   Platelets 190  150 - 400 K/uL   Neutrophils Relative % 75  43 - 77 %   Neutro Abs 7.6  1.7 - 7.7 K/uL   Lymphocytes Relative 13  12 - 46 %   Lymphs Abs 1.3  0.7 - 4.0 K/uL   Monocytes Relative 11  3 - 12 %   Monocytes Absolute 1.1 (*) 0.1 - 1.0 K/uL   Eosinophils Relative 1  0 - 5 %   Eosinophils Absolute 0.1  0.0 - 0.7 K/uL   Basophils Relative 0  0 - 1 %   Basophils Absolute 0.0  0.0 - 0.1 K/uL  BASIC METABOLIC PANEL      Result Value Range   Sodium 137  135 - 145 mEq/L   Potassium 4.9  3.5 - 5.1 mEq/L   Chloride 102  96 - 112 mEq/L   CO2 26  19 - 32 mEq/L   Glucose, Bld 103 (*) 70 - 99 mg/dL   BUN 8  6 - 23 mg/dL   Creatinine, Ser 6.29  0.50 - 1.10 mg/dL   Calcium 9.0  8.4 - 52.8 mg/dL   GFR calc non Af Amer 89 (*) >90 mL/min   GFR calc Af Amer >90  >90 mL/min  URINALYSIS,  ROUTINE W REFLEX MICROSCOPIC      Result Value Range   Color, Urine YELLOW  YELLOW   APPearance CLEAR  CLEAR   Specific Gravity, Urine 1.013  1.005 - 1.030   pH 7.5  5.0 - 8.0   Glucose, UA NEGATIVE  NEGATIVE mg/dL    Hgb urine dipstick NEGATIVE  NEGATIVE   Bilirubin Urine NEGATIVE  NEGATIVE   Ketones, ur 15 (*) NEGATIVE mg/dL   Protein, ur NEGATIVE  NEGATIVE mg/dL   Urobilinogen, UA 0.2  0.0 - 1.0 mg/dL   Nitrite NEGATIVE  NEGATIVE   Leukocytes, UA TRACE (*) NEGATIVE  URINE MICROSCOPIC-ADD ON      Result Value Range   Squamous Epithelial / LPF FEW (*) RARE   WBC, UA 0-2  <3 WBC/hpf   RBC / HPF 0-2  <3 RBC/hpf   Bacteria, UA RARE  RARE   Ct Abdomen Pelvis W Contrast  06/05/2013   *RADIOLOGY REPORT*  Clinical Data: Lower abdominal pain for the past 2 days.  CT ABDOMEN AND PELVIS WITH CONTRAST  Technique:  Multidetector CT imaging of the abdomen and pelvis was performed following the standard protocol during bolus administration of intravenous contrast.  Contrast: 80mL OMNIPAQUE IOHEXOL 300 MG/ML  SOLN  Comparison: No priors.  Findings:  Lung Bases: Minimal dependent atelectasis in the lower lobes of the lungs bilaterally (left greater than right).  Probable scarring in the inferior segment of the lingula.  A small hiatal hernia.  Abdomen/Pelvis:  A subcentimeter low attenuation lesion in segment five of the liver is too small to definitively characterize, but is favored to represent a small cyst.  The appearance of the gallbladder, pancreas, spleen and bilateral adrenal glands is unremarkable.  A subcentimeter low attenuation lesion in the interpolar region of the left kidney is too small to definitively characterize, but is favored to represent a small cyst.  There is also a well defined 1.4 cm low attenuation nonenhancing lesion in the lower pole of the right kidney, compatible with a simple cyst.  Numerous colonic diverticula are noted, and in the region of the sigmoid colon there is extensive surrounding inflammatory changes, compatible with severe acute diverticulitis.  This inflammation is extensive throughout the sigmoid mesocolon.  The most inflamed portion of the colon is immediately adjacent to the left  ovary. Image 74 of series 2 demonstrates a 1.4 cm low attenuation area in the left adnexal region immediately beneath the inflamed portion of the sigmoid colon, which is favored to represent a small left ovarian cyst rather than a diverticular abscess.  No other findings to suggest diverticular abscess are noted at this time.  Notably, this inflamed portion of the colon is rather mass-like in appearance, best demonstrated on axial image 69 of series 2. Numerous reactive sized lymph nodes are noted in the sigmoid mesocolon.  No pneumoperitoneum to suggest frank perforation at this time.  A trace volume of reactive fluid in the pelvis.  No larger volume of ascites.  No pathologic distension of small bowel. Relatively large volume of stool throughout the colon may suggest constipation.  Normal appendix (retrocecal).  Urinary bladder is unremarkable in appearance.  Musculoskeletal: There are no aggressive appearing lytic or blastic lesions noted in the visualized portions of the skeleton.  IMPRESSION: 1.  Findings, as above, compatible with severe acute appendicitis. At this time, there is no definite diverticular abscess or findings to suggest frank perforation. 2.  The extensive thickening of the sigmoid colon in the region of  active diverticulitis is somewhat mass-like in appearance.  This is favored to be related to the underlying infection/inflammation, however, underlying neoplasm is difficult to entirely exclude. Accordingly, referral for follow-up colonoscopy after resolution of the patient's acute presentation is suggested to exclude underlying neoplasm. 3.  Normal appendix (retrocecal). 4.  Small hiatal hernia. 5.  Additional incidental findings, as above.  Critical Value/emergent results were called by telephone at the time of interpretation on 06/05/2013 at 02:40 p.m. to PA Elpidio Anis, who verbally acknowledged these results.   Original Report Authenticated By: Trudie Reed, M.D.   Dg Abd Acute  W/chest  06/05/2013   *RADIOLOGY REPORT*  Clinical Data: Abdominal pain and distention.  Short of breath  ACUTE ABDOMEN SERIES (ABDOMEN 2 VIEW & CHEST 1 VIEW)  Comparison: 11/24/2011  Findings: Chest:  Heart size is normal.  Vascularity is normal. Mild left lower lobe atelectasis and a small left effusion.  No heart failure or edema.  Right lung is clear.  Abdomen:  Nondilated large and small bowel.  Air-fluid levels are present in the small bowel.  No free air.  Mild levoscoliosis of the lumbar spine.  No renal calculi.  IMPRESSION: Small area of airspace disease and effusion on the left lower lobe.  Air-fluid levels in the small bowel may represent ileus.  No dilated bowel loops and no free air.   Original Report Authenticated By: Janeece Riggers, M.D.   Labs Reviewed - No data to display No results found. No diagnosis found. 1. Diverticulitis  MDM  Patient's pain has been controlled in ED. CT scan showing diverticulitis with significant inflammation of bowel wall. No abscess visualized. VSS. She can be discharged home on PO abx and follow up with Dr. Loreta Ave (her GI physician). Discussed symptoms that should prompt return to the ED.  Arnoldo Hooker, PA-C 06/05/13 1521

## 2013-06-05 NOTE — ED Notes (Signed)
CT notified pt drank first cup of contrast 

## 2013-06-05 NOTE — ED Provider Notes (Signed)
Medical screening examination/treatment/procedure(s) were performed by non-physician practitioner and as supervising physician I was immediately available for consultation/collaboration.   Hurman Horn, MD 06/05/13 [phone]

## 2013-06-05 NOTE — ED Notes (Signed)
Patient resting with family at bedside. Returned from CT. States pain is 10/10. PA Upstill notified, received orders for Morphine 4 mg IV at this time.

## 2013-06-05 NOTE — ED Notes (Signed)
Patient transported to CT 

## 2013-06-05 NOTE — ED Notes (Signed)
Pt reports having severe bilateral lower quadrant abd pain x 2 days.  States that she has severe pain with ambulation.  She also reports SOB.  No distress noted.  Resp symmetrical and unlabored.  Reports being dx'd with a UTI four days ago and has been compliant with her antibiotics (does not remember which one).

## 2013-06-05 NOTE — ED Notes (Signed)
Patient transported to X-ray 

## 2013-06-07 ENCOUNTER — Other Ambulatory Visit: Payer: Self-pay | Admitting: Physician Assistant

## 2013-06-07 DIAGNOSIS — G8929 Other chronic pain: Secondary | ICD-10-CM

## 2013-06-09 ENCOUNTER — Other Ambulatory Visit: Payer: Self-pay | Admitting: Physician Assistant

## 2013-06-09 ENCOUNTER — Ambulatory Visit
Admission: RE | Admit: 2013-06-09 | Discharge: 2013-06-09 | Disposition: A | Discharge status: Home / Self Care | Payer: BC Managed Care – PPO | Source: Ambulatory Visit | Attending: Physician Assistant | Admitting: Physician Assistant

## 2013-06-09 DIAGNOSIS — G8929 Other chronic pain: Secondary | ICD-10-CM

## 2013-06-09 MED ORDER — METHYLPREDNISOLONE ACETATE 40 MG/ML INJ SUSP (RADIOLOG
120.0000 mg | Freq: Once | INTRAMUSCULAR | Status: AC
  Administered 2013-06-09: 120 mg via EPIDURAL

## 2013-06-09 MED ORDER — IOHEXOL 180 MG/ML  SOLN
1.0000 mL | Freq: Once | INTRAMUSCULAR | Status: AC | PRN
  Administered 2013-06-09: 1 mL via EPIDURAL

## 2013-06-18 ENCOUNTER — Emergency Department (HOSPITAL_COMMUNITY): Payer: BC Managed Care – PPO

## 2013-06-18 ENCOUNTER — Ambulatory Visit (INDEPENDENT_AMBULATORY_CARE_PROVIDER_SITE_OTHER): Payer: BC Managed Care – PPO | Admitting: Family Medicine

## 2013-06-18 ENCOUNTER — Emergency Department (HOSPITAL_COMMUNITY)
Admission: EM | Admit: 2013-06-18 | Discharge: 2013-06-19 | Disposition: A | Discharge status: Home / Self Care | Payer: BC Managed Care – PPO | Attending: Emergency Medicine | Admitting: Emergency Medicine

## 2013-06-18 ENCOUNTER — Encounter (HOSPITAL_COMMUNITY): Payer: Self-pay | Admitting: *Deleted

## 2013-06-18 VITALS — BP 122/88 | HR 84 | Temp 97.9°F | Resp 18 | Ht 63.0 in | Wt 162.0 lb

## 2013-06-18 DIAGNOSIS — K5792 Diverticulitis of intestine, part unspecified, without perforation or abscess without bleeding: Secondary | ICD-10-CM

## 2013-06-18 DIAGNOSIS — R5381 Other malaise: Secondary | ICD-10-CM | POA: Insufficient documentation

## 2013-06-18 DIAGNOSIS — R5383 Other fatigue: Secondary | ICD-10-CM | POA: Insufficient documentation

## 2013-06-18 DIAGNOSIS — R630 Anorexia: Secondary | ICD-10-CM | POA: Insufficient documentation

## 2013-06-18 DIAGNOSIS — R002 Palpitations: Secondary | ICD-10-CM

## 2013-06-18 DIAGNOSIS — R6883 Chills (without fever): Secondary | ICD-10-CM | POA: Insufficient documentation

## 2013-06-18 DIAGNOSIS — Z79899 Other long term (current) drug therapy: Secondary | ICD-10-CM | POA: Insufficient documentation

## 2013-06-18 DIAGNOSIS — G8929 Other chronic pain: Secondary | ICD-10-CM | POA: Insufficient documentation

## 2013-06-18 DIAGNOSIS — Z87440 Personal history of urinary (tract) infections: Secondary | ICD-10-CM | POA: Insufficient documentation

## 2013-06-18 DIAGNOSIS — R251 Tremor, unspecified: Secondary | ICD-10-CM

## 2013-06-18 DIAGNOSIS — Z7982 Long term (current) use of aspirin: Secondary | ICD-10-CM | POA: Insufficient documentation

## 2013-06-18 DIAGNOSIS — R259 Unspecified abnormal involuntary movements: Secondary | ICD-10-CM

## 2013-06-18 DIAGNOSIS — K5732 Diverticulitis of large intestine without perforation or abscess without bleeding: Secondary | ICD-10-CM | POA: Insufficient documentation

## 2013-06-18 DIAGNOSIS — R1032 Left lower quadrant pain: Secondary | ICD-10-CM | POA: Insufficient documentation

## 2013-06-18 DIAGNOSIS — R110 Nausea: Secondary | ICD-10-CM | POA: Insufficient documentation

## 2013-06-18 HISTORY — DX: Diverticulitis of intestine, part unspecified, without perforation or abscess without bleeding: K57.92

## 2013-06-18 LAB — POCT URINALYSIS DIPSTICK
Bilirubin, UA: NEGATIVE
Blood, UA: NEGATIVE
Glucose, UA: NEGATIVE
Ketones, UA: NEGATIVE
Leukocytes, UA: NEGATIVE
Nitrite, UA: NEGATIVE
Protein, UA: NEGATIVE
Spec Grav, UA: <=1.005
Urobilinogen, UA: 0.2
pH, UA: 5.5

## 2013-06-18 LAB — URINE MICROSCOPIC-ADD ON

## 2013-06-18 LAB — POCT CBC
Granulocyte percent: 74.4 %G (ref 37–80)
HCT, POC: 42.3 % (ref 37.7–47.9)
Hemoglobin: 13.5 g/dL (ref 12.2–16.2)
Lymph, poc: 2.2 (ref 0.6–3.4)
MCH, POC: 29.2 pg (ref 27–31.2)
MCHC: 31.9 g/dL (ref 31.8–35.4)
MCV: 91.6 fL (ref 80–97)
MID (cbc): 0.7 (ref 0–0.9)
MPV: 9.6 fL (ref 0–99.8)
POC Granulocyte: 8.4 — AB (ref 2–6.9)
POC LYMPH PERCENT: 19.1 %L (ref 10–50)
POC MID %: 6.5 %M (ref 0–12)
Platelet Count, POC: 419 10*3/uL (ref 142–424)
RBC: 4.62 M/uL (ref 4.04–5.48)
RDW, POC: 15.3 %
WBC: 11.3 10*3/uL — AB (ref 4.6–10.2)

## 2013-06-18 LAB — COMPREHENSIVE METABOLIC PANEL
ALT: 16 U/L (ref 0–35)
AST: 15 U/L (ref 0–37)
Albumin: 4.1 g/dL (ref 3.5–5.2)
Alkaline Phosphatase: 54 U/L (ref 39–117)
BUN: 11 mg/dL (ref 6–23)
CO2: 25 mEq/L (ref 19–32)
Calcium: 9.6 mg/dL (ref 8.4–10.5)
Chloride: 102 mEq/L (ref 96–112)
Creatinine, Ser: 1.14 mg/dL — ABNORMAL HIGH (ref 0.50–1.10)
GFR calc Af Amer: 59 mL/min — ABNORMAL LOW (ref >90)
GFR calc non Af Amer: 51 mL/min — ABNORMAL LOW (ref >90)
Glucose, Bld: 108 mg/dL — ABNORMAL HIGH (ref 70–99)
Potassium: 3.8 mEq/L (ref 3.5–5.1)
Sodium: 140 mEq/L (ref 135–145)
Total Bilirubin: 0.3 mg/dL (ref 0.3–1.2)
Total Protein: 6.6 g/dL (ref 6.0–8.3)

## 2013-06-18 LAB — URINALYSIS, ROUTINE W REFLEX MICROSCOPIC
Bilirubin Urine: NEGATIVE
Glucose, UA: NEGATIVE mg/dL
Hgb urine dipstick: NEGATIVE
Ketones, ur: 15 mg/dL — AB
Nitrite: NEGATIVE
Protein, ur: NEGATIVE mg/dL
Specific Gravity, Urine: 1.01 (ref 1.005–1.030)
Urobilinogen, UA: 0.2 mg/dL (ref 0.0–1.0)
pH: 5 (ref 5.0–8.0)

## 2013-06-18 LAB — CBC WITH DIFFERENTIAL/PLATELET
Basophils Absolute: 0.1 10*3/uL (ref 0.0–0.1)
Basophils Relative: 0 % (ref 0–1)
Eosinophils Absolute: 0.1 10*3/uL (ref 0.0–0.7)
Eosinophils Relative: 1 % (ref 0–5)
HCT: 42.6 % (ref 36.0–46.0)
Hemoglobin: 14 g/dL (ref 12.0–15.0)
Lymphocytes Relative: 18 % (ref 12–46)
Lymphs Abs: 2 10*3/uL (ref 0.7–4.0)
MCH: 28.9 pg (ref 26.0–34.0)
MCHC: 32.9 g/dL (ref 30.0–36.0)
MCV: 88 fL (ref 78.0–100.0)
Monocytes Absolute: 0.9 10*3/uL (ref 0.1–1.0)
Monocytes Relative: 8 % (ref 3–12)
Neutro Abs: 8.1 10*3/uL — ABNORMAL HIGH (ref 1.7–7.7)
Neutrophils Relative %: 73 % (ref 43–77)
Platelets: 413 10*3/uL — ABNORMAL HIGH (ref 150–400)
RBC: 4.84 MIL/uL (ref 3.87–5.11)
RDW: 15.6 % — ABNORMAL HIGH (ref 11.5–15.5)
WBC: 11.2 10*3/uL — ABNORMAL HIGH (ref 4.0–10.5)

## 2013-06-18 LAB — POCT UA - MICROSCOPIC ONLY
Casts, Ur, LPF, POC: NEGATIVE
Crystals, Ur, HPF, POC: NEGATIVE
Mucus, UA: NEGATIVE
Yeast, UA: NEGATIVE

## 2013-06-18 LAB — OCCULT BLOOD, POC DEVICE: Fecal Occult Bld: NEGATIVE

## 2013-06-18 LAB — GLUCOSE, POCT (MANUAL RESULT ENTRY): POC Glucose: 98 mg/dl (ref 70–99)

## 2013-06-18 MED ORDER — SODIUM CHLORIDE 0.9 % IV SOLN: INTRAVENOUS | Status: DC

## 2013-06-18 MED ORDER — SODIUM CHLORIDE 0.9 % IV BOLUS (SEPSIS)
1000.0000 mL | Freq: Once | INTRAVENOUS | Status: AC
  Administered 2013-06-18: 1000 mL via INTRAVENOUS

## 2013-06-18 MED ORDER — IOHEXOL 300 MG/ML  SOLN
100.0000 mL | Freq: Once | INTRAMUSCULAR | Status: AC | PRN
  Administered 2013-06-18: 100 mL via INTRAVENOUS

## 2013-06-18 MED ORDER — IOHEXOL 300 MG/ML  SOLN
25.0000 mL | INTRAMUSCULAR | Status: AC
  Administered 2013-06-18: 25 mL via ORAL

## 2013-06-18 MED ORDER — LORAZEPAM 1 MG PO TABS
1.0000 mg | ORAL_TABLET | Freq: Once | ORAL | Status: AC
  Administered 2013-06-19: 1 mg via ORAL
  Filled 2013-06-18: qty 1

## 2013-06-18 NOTE — ED Provider Notes (Signed)
CT abd/pelvis shows residual diverticulitis.  Discussed results w/ patient and her husband at length.  Explained that this is the likely cause of her LLQ pain, and that nausea and anorexia may be related, or more likely are secondary to a viral syndrome.  On re-examination, pt afebrile, VSS, non-toxic appearing, well-hydrated, abd benign w/ exception of LLQ ttp.  Discussed case w/ Dr. Micheline Maze who agrees that starting second round of abx, augmentin this time, and GI f/u would be appropriate.  Plan relayed to patient.  Offered to prescribe pain medication which she rudely declined, as well as an anti-emetic.  She demanded to see another provider shortly after.  Stated that I have upset her and that she knows 2 other people who have had abdominal pain this year and their doctor cared about them and found out what was wrong with them and made sure they were getting better.  Says that when I took care of her when she presented w/ chronic back pain last monght and recommended she see pain management, that I was accusing her of being a drug addict.  She is angry and being unreasonable.  Dr. Norlene Campbell to see and then will d/c home.  11:35 PM   Otilio Miu, PA-C 06/19/13 [phone]

## 2013-06-18 NOTE — ED Notes (Signed)
Dr Norlene Campbell at bedside speaking with pt and husband

## 2013-06-18 NOTE — Patient Instructions (Addendum)
Please proceed to the [facility] for further evaluation.

## 2013-06-18 NOTE — ED Provider Notes (Signed)
CSN: [id number]     Arrival date & time 06/18/13  1617 History     First MD Initiated Contact with Patient 06/18/13 1822     Chief Complaint  Patient presents with  . Nausea   (Consider location/radiation/quality/duration/timing/severity/associated sxs/prior Treatment) HPI 62 year old female presents with nausea, anorexia, and chills.   Patient reports that she was recently diagnosed and treated for diverticulitis on 8/1.  She has also had a recent epidural steroid injection on 8/5.    For the past several days she has been feeling poorly.  She reports feeling "hot and cold", with decrease PO intake and difficulty sleeping at night.  She states that she just doesn't feel like herself.  She reports continued LLQ pain with associated nausea.  No vomiting.  She reports that her back pain has improved tremendously and she denies back pain currently.  She also denies any LE weakness.    Past Medical History  Diagnosis Date  . Chronic back pain   . UTI (lower urinary tract infection)   . Diverticulitis    History reviewed. No pertinent past surgical history. Family History  Problem Relation Age of Onset  . Cancer Mother   . Cancer Father    History  Substance Use Topics  . Smoking status: Former Games developer  . Smokeless tobacco: Not on file  . Alcohol Use: No   OB History   Grav Para Term Preterm Abortions TAB SAB Ect Mult Living                 Review of Systems  Constitutional: Positive for chills, appetite change and fatigue.  Respiratory: Negative for chest tightness and shortness of breath.   Cardiovascular: Negative for chest pain.  Gastrointestinal: Positive for nausea and abdominal pain. Negative for vomiting, constipation, blood in stool and abdominal distention.  Genitourinary: Negative for dysuria, urgency, frequency and difficulty urinating.  Musculoskeletal: Negative for back pain.  Skin: Negative for rash.  Neurological: Negative.     Allergies  Review of  patient's allergies indicates no known allergies.  Home Medications   Current Outpatient Rx  Name  Route  Sig  Dispense  Refill  . Ascorbic Acid (VITAMIN C PO)   Oral   Take 1 tablet by mouth daily.         Marland Kitchen aspirin EC 81 MG tablet   Oral   Take 81 mg by mouth daily.         . Cyanocobalamin (VITAMIN B-12 CR PO)   Oral   Take 1 tablet by mouth daily.         . Flaxseed, Linseed, (FLAX SEED OIL PO)   Oral   Take 1 capsule by mouth daily.         Marland Kitchen GARLIC PO   Oral   Take 1 tablet by mouth daily.         . ondansetron (ZOFRAN) 4 MG tablet   Oral   Take 1 tablet (4 mg total) by mouth every 6 (six) hours.   12 tablet   0   . vitamin E 100 UNIT capsule   Oral   Take 100 Units by mouth daily.          BP 139/90  Pulse 80  Temp(Src) 98.4 F (36.9 C) (Oral)  Resp 26  SpO2 98% Physical Exam  Constitutional: She is oriented to person, place, and time. She appears well-developed and well-nourished.     HENT:  Head: Normocephalic and atraumatic.  Eyes: No scleral icterus.  Cardiovascular: Normal rate and regular rhythm.   No murmur heard. Pulmonary/Chest: Effort normal and breath sounds normal. She has no wheezes. She has no rales.  Abdominal: Soft. She exhibits no distension and no mass. There is no rebound and no guarding.  Tender to palpation in LLQ.  Musculoskeletal: She exhibits no edema.  Back - nontender to palpation. No erythema or overlying skin lesions.   Neurological: She is alert and oriented to person, place, and time.  Normal muscle strength in LE's.   Skin: Skin is warm and dry.   ED Course   Procedures (including critical care time)  Labs Reviewed  CBC WITH DIFFERENTIAL - Abnormal; Notable for the following:    WBC 11.2 (*)    RDW 15.6 (*)    Platelets 413 (*)    Neutro Abs 8.1 (*)    All other components within normal limits  COMPREHENSIVE METABOLIC PANEL - Abnormal; Notable for the following:    Glucose, Bld 108 (*)     Creatinine, Ser 1.14 (*)    GFR calc non Af Amer 51 (*)    GFR calc Af Amer 59 (*)    All other components within normal limits  URINALYSIS, ROUTINE W REFLEX MICROSCOPIC - Abnormal; Notable for the following:    APPearance HAZY (*)    Ketones, ur 15 (*)    Leukocytes, UA TRACE (*)    All other components within normal limits  URINE MICROSCOPIC-ADD ON - Abnormal; Notable for the following:    Squamous Epithelial / LPF FEW (*)    All other components within normal limits   No results found. No diagnosis found.  MDM  62 year old female with recent, severe diverticulitis (8/1) presents with chills, anorexia, and chills.  Patient also had recent epidural steroid injection. - CBC revealed mild leukocytosis.  CMP revealed AKI (creatinine of 1.14).  Will give NS bolus.  - Given LLQ tenderness and history, I suspected underlying abscess from Diverticulitis.  Will obtained CT abdomen/pelvis. - Do not suspect epidural abscess given absence of back pain and neurological findings.  1950 - Awaiting CT abdomen/pelvis.  2030 - Patient signed out to oncoming PA [employer].  Tommie Sams, DO 06/18/13 2030

## 2013-06-18 NOTE — ED Provider Notes (Signed)
I saw and evaluated the patient, reviewed the resident's note and I agree with the findings and plan.  Patient seen and examined and with left lower quadrant pain suspicious for an abscess from her diverticulitis. She also has lower back pain but that has been improved since she had epidural injection and she denies any change in her bowel or bladder function. She also denies any weakness in her legs. Doubt that she has epidural abscess suspect more that this is an abdominal process. Will order abdominal CT  Toy Baker, MD 06/18/13 [phone]

## 2013-06-18 NOTE — Progress Notes (Signed)
[facility] [address], [place] [address] [phone]  Date:  06/18/2013   Name:  Jenny Glass   DOB:  1951/03/16   MRN:  [id number]  PCP:  No PCP Per Patient    Chief Complaint: hotflashes, cold chills, nausea,not sleeping, shakes, not ea   History of Present Illness:  Jenny Glass is a 62 y.o. very pleasant female patient who presents with the following:  She was in the ED about 2 weeks ago with abdominal pain and SOB.  She was diagnosed with acute diverticulitis.  Her CT report states she has appendicitis, but this appears to be a typo- called radiology to ask to have this corrected.  She has never had diverticulitis in the past.  She was treated with cipro and flagyl, and finished all of her medication.  She felt like she was getting better for a few days after her treatment.    She is here today because she continues to feel nauseated, and feels "hot and cold."  She is having a hard time sleeping at night.  She feels shaky.  She has noted these sx since early last week She notes that her abdominal pain has now improved- does not feel that it was worse again  She had an epidural spinal injection on 8/5- this was her first injection.  She has been noted to have lower back pain "forever."  She had her injection done by IR.  Her ortho is Dr. Penni Bombard.  She thinks that she has taken oral prednisone in the past without ill effect.    She has not had a fever.  No vomiting.   She is drinking liquids ok.  However she notes that "they don't taste right." She feels shaky, twitchy, weak, "like I'm not myself."   Yesterday she felt "real light- headed."  Nearly "passed out" but did not actually lose consciousness.  She has noted chest tightness, mostly at night.  Last noted this a couple of nights ago.  She has also noted some racing heart beat at times.    There are no active problems to display for this patient.   Past Medical History  Diagnosis Date  .  Chronic back pain   . UTI (lower urinary tract infection)     History reviewed. No pertinent past surgical history.  History  Substance Use Topics  . Smoking status: Former Games developer  . Smokeless tobacco: Not on file  . Alcohol Use: No    Family History  Problem Relation Age of Onset  . Cancer Mother   . Cancer Father     No Known Allergies  Medication list has been reviewed and updated.  Current Outpatient Prescriptions on File Prior to Visit  Medication Sig Dispense Refill  . Ascorbic Acid (VITAMIN C PO) Take 1 tablet by mouth daily.      Marland Kitchen aspirin EC 81 MG tablet Take 81 mg by mouth daily.      . Cyanocobalamin (VITAMIN B-12 CR PO) Take 1 tablet by mouth daily.      . Flaxseed, Linseed, (FLAX SEED OIL PO) Take 1 capsule by mouth daily.      Marland Kitchen GARLIC PO Take 1 tablet by mouth daily.      . vitamin E 100 UNIT capsule Take 100 Units by mouth daily.      . ciprofloxacin (CIPRO) 500 MG tablet Take 1 tablet (500 mg total) by mouth 2 (two) times daily.  14 tablet  0  . ciprofloxacin (CIPRO) 500 MG tablet Take 1 tablet (500 mg total) by mouth 2 (two) times daily.  14 tablet  0  . gabapentin (NEURONTIN) 300 MG capsule Take 300 mg by mouth 2 (two) times daily.      . metroNIDAZOLE (FLAGYL) 500 MG tablet Take 1 tablet (500 mg total) by mouth 3 (three) times daily.  21 tablet  0  . omega-3 acid ethyl esters (LOVAZA) 1 G capsule Take 2 g by mouth daily.      . ondansetron (ZOFRAN) 4 MG tablet Take 1 tablet (4 mg total) by mouth every 6 (six) hours.  12 tablet  0  . ondansetron (ZOFRAN) 8 MG tablet Take 1 tablet (8 mg total) by mouth every 8 (eight) hours as needed for nausea.  20 tablet  0  . oxyCODONE-acetaminophen (PERCOCET/ROXICET) 5-325 MG per tablet Take 1 tablet by mouth every 4 (four) hours as needed for pain.  20 tablet  0  . oxyCODONE-acetaminophen (PERCOCET/ROXICET) 5-325 MG per tablet Take 1-2 tablets by mouth every 4 (four) hours as needed for pain.  25 tablet  0  . sodium  phosphate (FLEET) enema Place 1 enema rectally once. follow package directions       No current facility-administered medications on file prior to visit.    Review of Systems:  As per HPI- otherwise negative. Tends to get constipation with opioids so states she does not usually take these.  She took percocet for about 2 weeks- last took it a week or so ago.  She has not needed them since she had her steroid injection No history of panic attacks or anxiety disorder in the past per her report.  Admits she has been somewhat stressed with her health issues of late, but does not really think she has been overly stressed.   She does not drink alcohol.    Physical Examination: Filed Vitals:   06/18/13 1415  BP: 118/86  Pulse: 75  Temp: 97.9 F (36.6 C)  Resp: 18   Filed Vitals:   06/18/13 1415  Height: 5\' 3"  (1.6 m)  Weight: 162 lb (73.483 kg)   Body mass index is 28.7 kg/(m^2). Ideal Body Weight: Weight in (lb) to have BMI = 25: 140.8  GEN: WDWN, NAD, Non-toxic, A & O x 3, overweight (mild), here with her husband today.  Shaky, appears worried.  Mild trembling in extremities intermittently.  HEENT: Atraumatic, Normocephalic. Neck supple. No masses, No LAD.  Bilateral TM wnl, oropharynx normal.  PEERL,EOMI.   Tongue is trembling. Ears and Nose: No external deformity. CV: RRR, No M/G/R. No JVD. No thrill. No extra heart sounds. PULM: CTA B, no wheezes, crackles, rhonchi. No retractions. No resp. distress. No accessory muscle use. ABD: S, ND, +BS. No rebound. No HSM.  She has mild tenderness of her left lower quadrant.   EXTR: No c/c/e NEURO Normal gait.  PSYCH: Normally interactive. Conversant. Not depressed or anxious appearing.  Calm demeanor.   EKG:  NSR, no ST elevation or depression Results for orders placed in visit on 06/18/13  POCT CBC      Result Value Range   WBC 11.3 (*) 4.6 - 10.2 K/uL   Lymph, poc 2.2  0.6 - 3.4   POC LYMPH PERCENT 19.1  10 - 50 %L   MID (cbc) 0.7   0 - 0.9   POC MID % 6.5  0 - 12 %M   POC Granulocyte 8.4 (*) 2 - 6.9  Granulocyte percent 74.4  37 - 80 %G   RBC 4.62  4.04 - 5.48 M/uL   Hemoglobin 13.5  12.2 - 16.2 g/dL   HCT, POC 84.6  96.2 - 47.9 %   MCV 91.6  80 - 97 fL   MCH, POC 29.2  27 - 31.2 pg   MCHC 31.9  31.8 - 35.4 g/dL   RDW, POC 95.2     Platelet Count, POC 419  142 - 424 K/uL   MPV 9.6  0 - 99.8 fL  GLUCOSE, POCT (MANUAL RESULT ENTRY)      Result Value Range   POC Glucose 98  70 - 99 mg/dl  POCT UA - MICROSCOPIC ONLY      Result Value Range   WBC, Ur, HPF, POC 0-1     RBC, urine, microscopic 0-1     Bacteria, U Microscopic trace     Mucus, UA neg     Epithelial cells, urine per micros 0-1     Crystals, Ur, HPF, POC neg     Casts, Ur, LPF, POC neg     Yeast, UA neg    POCT URINALYSIS DIPSTICK      Result Value Range   Color, UA yellow     Clarity, UA clear     Glucose, UA neg     Bilirubin, UA neg     Ketones, UA neg     Spec Grav, UA <=1.005     Blood, UA neg     pH, UA 5.5     Protein, UA neg     Urobilinogen, UA 0.2     Nitrite, UA neg     Leukocytes, UA Negative      Assessment and Plan: Shakiness - Plan: POCT CBC, POCT glucose (manual entry), POCT UA - Microscopic Only, POCT urinalysis dipstick  Palpitations - Plan: EKG 12-Lead  Deliliah is here today with feeling of shakiness and tremulousness. Her VS are stable and normal.  Recent health history includes severe diverticulitis and her first epidural steroid injection.  Her current sx may be due to her steroid injection, ?pain medication withdrawal (appears unlikely), worsening/ recurrent diverticulitis.  She will proceed to the ER for further evaluation.  Appreciate ED care of this nice patient.    Signed Abbe Amsterdam, MD

## 2013-06-18 NOTE — ED Notes (Addendum)
Sent to the ED from [facility] for further eval of nausea and chills for the past week. Blood work and cxr was done at [facility] office today. Denies difficulty with urination. Appears in nad. Ambulatory without difficulty. States she was in ED a cple weeks ago and was dx with diverticulitis and sent home with abx.

## 2013-06-18 NOTE — ED Notes (Signed)
Prefers to talk with another DR. For a second opinion.

## 2013-06-18 NOTE — ED Notes (Signed)
Dr Allen at bedside  

## 2013-06-19 MED ORDER — AMOXICILLIN-POT CLAVULANATE 875-125 MG PO TABS: 1.0000 | ORAL_TABLET | Freq: Two times a day (BID) | ORAL | Status: DC

## 2013-06-19 MED ORDER — ONDANSETRON HCL 8 MG PO TABS: 8.0000 mg | ORAL_TABLET | Freq: Three times a day (TID) | ORAL | Status: DC | PRN

## 2013-06-19 NOTE — ED Notes (Signed)
Pt asking about prescription for abx, was told she was going to get one; will ask ed pa

## 2013-06-19 NOTE — ED Notes (Signed)
Pt given prescription. 

## 2013-06-19 NOTE — ED Notes (Signed)
Ed pa in room, updated pt on delay

## 2013-06-19 NOTE — ED Notes (Signed)
Pt states that her questions were answered by Dr Norlene Campbell and she feels better going home

## 2013-06-26 NOTE — ED Provider Notes (Signed)
Medical screening examination/treatment/procedure(s) were performed by non-physician practitioner and as supervising physician I was immediately available for consultation/collaboration.  Toy Baker, MD 06/26/13 1122

## 2013-06-26 NOTE — ED Provider Notes (Signed)
I saw and evaluated the patient, reviewed the resident's note and I agree with the findings and plan.  Toy Baker, MD 06/26/13 [phone]

## 2013-08-27 ENCOUNTER — Other Ambulatory Visit: Payer: Self-pay | Admitting: Otolaryngology

## 2013-08-27 DIAGNOSIS — J392 Other diseases of pharynx: Secondary | ICD-10-CM

## 2013-08-31 ENCOUNTER — Other Ambulatory Visit: Payer: BC Managed Care – PPO

## 2015-05-02 ENCOUNTER — Other Ambulatory Visit: Payer: Self-pay | Admitting: Family

## 2015-05-02 ENCOUNTER — Other Ambulatory Visit: Payer: Self-pay

## 2015-05-02 DIAGNOSIS — E2839 Other primary ovarian failure: Secondary | ICD-10-CM

## 2015-05-05 ENCOUNTER — Other Ambulatory Visit: Payer: Self-pay | Admitting: Specialist

## 2015-05-05 DIAGNOSIS — IMO0002 Reserved for concepts with insufficient information to code with codable children: Secondary | ICD-10-CM

## 2015-05-05 LAB — CYTOLOGY - PAP

## 2015-05-10 ENCOUNTER — Ambulatory Visit
Admission: RE | Admit: 2015-05-10 | Discharge: 2015-05-10 | Disposition: A | Discharge status: Home / Self Care | Payer: BLUE CROSS/BLUE SHIELD | Source: Ambulatory Visit | Attending: Family | Admitting: Family

## 2015-05-10 DIAGNOSIS — E2839 Other primary ovarian failure: Secondary | ICD-10-CM

## 2015-05-11 ENCOUNTER — Ambulatory Visit
Admission: RE | Admit: 2015-05-11 | Discharge: 2015-05-11 | Disposition: A | Discharge status: Home / Self Care | Payer: BLUE CROSS/BLUE SHIELD | Source: Ambulatory Visit | Attending: Specialist | Admitting: Specialist

## 2015-05-11 ENCOUNTER — Ambulatory Visit
Admission: RE | Admit: 2015-05-11 | Discharge: 2015-05-11 | Disposition: A | Discharge status: Home / Self Care | Payer: Self-pay | Source: Ambulatory Visit | Attending: Specialist | Admitting: Specialist

## 2015-05-11 ENCOUNTER — Other Ambulatory Visit: Payer: Self-pay | Admitting: Specialist

## 2015-05-11 DIAGNOSIS — R52 Pain, unspecified: Secondary | ICD-10-CM

## 2015-05-11 DIAGNOSIS — IMO0002 Reserved for concepts with insufficient information to code with codable children: Secondary | ICD-10-CM

## 2015-05-11 MED ORDER — DIAZEPAM 5 MG PO TABS
10.0000 mg | ORAL_TABLET | Freq: Once | ORAL | Status: AC
  Administered 2015-05-11: 5 mg via ORAL

## 2015-05-11 MED ORDER — IOHEXOL 180 MG/ML  SOLN
15.0000 mL | Freq: Once | INTRAMUSCULAR | Status: AC | PRN
  Administered 2015-05-11: 15 mL via INTRATHECAL

## 2015-05-11 NOTE — Discharge Instructions (Signed)

## 2015-05-31 ENCOUNTER — Other Ambulatory Visit: Payer: Self-pay | Admitting: Specialist

## 2015-05-31 DIAGNOSIS — M5441 Lumbago with sciatica, right side: Secondary | ICD-10-CM

## 2015-06-02 ENCOUNTER — Other Ambulatory Visit: Payer: Self-pay | Admitting: Specialist

## 2015-06-02 ENCOUNTER — Ambulatory Visit
Admission: RE | Admit: 2015-06-02 | Discharge: 2015-06-02 | Disposition: A | Discharge status: Home / Self Care | Payer: BLUE CROSS/BLUE SHIELD | Source: Ambulatory Visit | Attending: Specialist | Admitting: Specialist

## 2015-06-02 VITALS — BP 179/102 | HR 65

## 2015-06-02 DIAGNOSIS — M5441 Lumbago with sciatica, right side: Secondary | ICD-10-CM

## 2015-06-02 DIAGNOSIS — M5137 Other intervertebral disc degeneration, lumbosacral region: Secondary | ICD-10-CM

## 2015-06-02 MED ORDER — METHYLPREDNISOLONE ACETATE 40 MG/ML INJ SUSP (RADIOLOG
120.0000 mg | Freq: Once | INTRAMUSCULAR | Status: AC
  Administered 2015-06-02: 120 mg via EPIDURAL

## 2015-06-02 MED ORDER — IOHEXOL 180 MG/ML  SOLN
1.0000 mL | Freq: Once | INTRAMUSCULAR | Status: AC | PRN
  Administered 2015-06-02: 1 mL via EPIDURAL

## 2015-06-02 NOTE — Discharge Instructions (Signed)

## 2015-10-25 ENCOUNTER — Ambulatory Visit (INDEPENDENT_AMBULATORY_CARE_PROVIDER_SITE_OTHER): Payer: BLUE CROSS/BLUE SHIELD | Admitting: Family Medicine

## 2015-10-25 VITALS — BP 114/72 | HR 88 | Temp 98.2°F | Resp 16 | Ht 63.0 in

## 2015-10-25 DIAGNOSIS — Z87440 Personal history of urinary (tract) infections: Secondary | ICD-10-CM

## 2015-10-25 DIAGNOSIS — R8290 Unspecified abnormal findings in urine: Secondary | ICD-10-CM | POA: Diagnosis not present

## 2015-10-25 LAB — POCT URINALYSIS DIP (MANUAL ENTRY)
Bilirubin, UA: NEGATIVE
Glucose, UA: NEGATIVE
Ketones, POC UA: NEGATIVE
Nitrite, UA: NEGATIVE
Protein Ur, POC: NEGATIVE
Spec Grav, UA: <=1.005
Urobilinogen, UA: 0.2
pH, UA: 5.5

## 2015-10-25 LAB — POC MICROSCOPIC URINALYSIS (UMFC): Mucus: ABSENT

## 2015-10-25 NOTE — Patient Instructions (Signed)
No further antibiotics should be needed at this time  Call your surgeon's office and let them know that the urine was clear today.  Return if any further symptoms. It is best to try and get treated as soon as possible.  I will let you know the results of the urine culture, but should you not hear from [place] in the next 3-4 days call back and see if I have those results.

## 2015-10-25 NOTE — Progress Notes (Signed)
Patient ID: Jenny Glass, female    DOB: 10/25/1951  Age: 64 y.o. MRN: [id number]  Chief Complaint  Patient presents with  . Urinary Frequency  . urine odor    Subjective:   64 year old lady who comes in with history of having a urinary tract infection over the past couple of weeks. She was treated first with a course of nitrofurantoin, for 1 week, then for 3 days with ciprofloxacin. This treatment was done through a doctor at [facility]. She is in here today to recheck on her UTI because she needs it clear completely before having a rotator cuff repair. She also has some problems with a cervical disc and some low back pain problems.  Current allergies, medications, problem list, past/family and social histories reviewed.  Objective:  BP 114/72 mmHg  Pulse 88  Temp(Src) 98.2 F (36.8 C) (Oral)  Resp 16  Ht 5\' 3"  (1.6 m)  SpO2 95% No cva tenderness.  Urine clear. Results for orders placed or performed in visit on 10/25/15  POCT Microscopic Urinalysis ([facility])  Result Value Ref Range   WBC,UR,HPF,POC Few (A) None WBC/hpf   RBC,UR,HPF,POC None None RBC/hpf   Bacteria None None, Too numerous to count   Mucus Absent Absent   Epithelial Cells, UR Per Microscopy Few (A) None, Too numerous to count cells/hpf  POCT urinalysis dipstick  Result Value Ref Range   Color, UA yellow yellow   Clarity, UA cloudy (A) clear   Glucose, UA negative negative   Bilirubin, UA negative negative   Ketones, POC UA negative negative   Spec Grav, UA <=1.005    Blood, UA trace-lysed (A) negative   pH, UA 5.5    Protein Ur, POC negative negative   Urobilinogen, UA 0.2    Nitrite, UA Negative Negative   Leukocytes, UA Trace (A) Negative     Assessment & Plan:   Assessment: 1. Abnormal urine odor   2. History of urinary tract infection       Plan: Appears to be cleared out for urinary tract infection, however because she is planning surgery I'm going ahead and cultured to be absolutely  certain. I think it is fine for them to go ahead and set up for the surgery.  Orders Placed This Encounter  Procedures  . POCT Microscopic Urinalysis ([facility])  . POCT urinalysis dipstick    We will send a copy of this note to Dr. Susa Day at [facility].  Patient Instructions  No further antibiotics should be needed at this time  Call your surgeon's office and let them know that the urine was clear today.  Return if any further symptoms. It is best to try and get treated as soon as possible.  I will let you know the results of the urine culture, but should you not hear from [place] in the next 3-4 days call back and see if I have those results.     Return if symptoms worsen or fail to improve.   Mirza Fessel, MD 10/25/2015

## 2015-10-26 LAB — URINE CULTURE: Colony Count: 40000

## 2015-10-27 ENCOUNTER — Ambulatory Visit: Payer: Self-pay | Admitting: Orthopedic Surgery

## 2015-11-02 ENCOUNTER — Ambulatory Visit: Payer: Self-pay | Admitting: Orthopedic Surgery

## 2015-11-02 NOTE — H&P (Signed)
Jenny Glass is an 64 y.o. female.   Chief Complaint: L shoulder pain HPI: The patient is a 64 year old female who presents today for follow up of their neck. The patient is being followed for their left-sided neck pain and left shoulder pain. They are 3 1/2 months out from most recent flare up. Symptoms reported today include: pain and weakness. The patient feels that they are doing poorly. Current treatment includes: relative rest, activity modification and pain medications. The following medication has been used for pain control: Norco (and gabapentin and Robaxin). The patient presents today following MRI.  Past Medical History  Diagnosis Date  . Chronic back pain   . UTI (lower urinary tract infection)   . Diverticulitis     No past surgical history on file.  Family History  Problem Relation Age of Onset  . Cancer Mother   . Cancer Father    Social History:  reports that she has quit smoking. She does not have any smokeless tobacco history on file. She reports that she does not drink alcohol or use illicit drugs.  Allergies: No Known Allergies   (Not in a hospital admission)  No results found for this or any previous visit (from the past 48 hour(s)). No results found.  Review of Systems  Constitutional: Negative.   Eyes: Negative.   Respiratory: Negative.   Cardiovascular: Negative.   Gastrointestinal: Negative.   Genitourinary: Negative.   Musculoskeletal: Positive for back pain, joint pain and neck pain.  Skin: Negative.   Neurological: Positive for sensory change and focal weakness.  Psychiatric/Behavioral: Negative.     There were no vitals taken for this visit. Physical Exam  Constitutional: She is oriented to person, place, and time. She appears well-developed. She appears distressed.  HENT:  Head: Normocephalic.  Eyes: Pupils are equal, round, and reactive to light.  Neck: Normal range of motion.  Cardiovascular: Normal rate.   Respiratory: Effort normal.   GI: Soft.  Musculoskeletal:  On exam, positive impingement sign, positive secondary impingement sign of the shoulder. Decreased internal rotation. Tender over the [facility], positive cross over maneuver. Sensory exam is intact. 5-/5 in the grip strength secondary to hand pain. Hyperreflexic. No DVT. Some pain with extension of the cervical spine.  Lumbar spine, straight leg raise negative. Motor is 5/5. No DVT. No instability of hips, knees and ankles.  Neurological: She is alert and oriented to person, place, and time.  Skin: Skin is warm and dry.    MRI of her shoulder demonstrates a full thickness tear of the rotator cuff. Severe AC arthrosis. Mild fatty atrophy of the supraspinatus, high grade tear of the superior portion of the subscap, medial subluxation of the biceps, tendinosis of the biceps, severe AC arthrosis. Glenohumeral arthrosis. Degenerative labral tear.  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  Assessment/Plan 1. Rotator cuff tear, full thickness, supraspinatus, infraspinatus, superior portion of the subscap with biceps tendon subluxation associated AC arthrosis. 2. Positional radicular pain, secondary to secondary severe cervical foraminal stenosis at C3-4 and at C4-5. 3. Mechanical back pain secondary to lumbar spondylosis, disc degeneration.  We discussed options in extensive detail. She also had an epidural at L5-S1 for diagnostic therapeutic purposes that helped to some degree.  At this point in time, given her full thickness tear, the severity of her pain, limitation of her range of motion, proceed with mini open rotator cuff repair, distal clavicle resection, perhaps a repair  of the biceps anchor complex in the superior portion of the subscap.  I had a long discussion with the patient concerning the risks and benefits of a rotator cuff repair, including bleeding, infection, prolonged postoperative recovery, which may require 3  to 5 months until maximum medical improvement. Overight procedure with initiation of early passive range of motion within physical therapy. Avoid any active motion for the first six weeks. This is all in an effort to avoid recurrent tear of the rotator cuff and adhesive capsulitis. Return to work without use of the arm can be obtained following two weeks. However, driving will be a challenge. We also discussed the possibility of requiring implants including bone anchors,as well as an Allograft patch graft if a massive rotator cuff tear is encountered. Removal of any bones for spurs as well as bursitis will be performed during the procedure and also any associated anesthetic complications as well.  Discussed positional modifications for cervical spine. Avoid extension, favor flexion. If persistent, we discussed selective nerve root block. No neurologic deficit. We will favor conservative treatment. She reports significantly more problems with her shoulder pain now than her back or her neck.  In terms of her lumbar spine, avoid extension and flexion, unsupported bending, better from her injection. We will continue with conservative management for that. Gave her a prescription by her request of Percocet as the hydrocodone is not helping. She has an increase in the Neurontin to two or three times a day as tolerated. No history of DVT. She is here with her husband. We will schedule her accordingly.  Plan left shoulder mini-open RCR/DCR  BISSELL, JACLYN M. PA-C for Dr. Tonita Cong 11/02/2015, 2:09 PM

## 2015-11-06 ENCOUNTER — Encounter (HOSPITAL_COMMUNITY): Payer: Self-pay | Admitting: Nurse Practitioner

## 2015-11-06 ENCOUNTER — Emergency Department (HOSPITAL_COMMUNITY)
Admission: EM | Admit: 2015-11-06 | Discharge: 2015-11-06 | Disposition: A | Discharge status: Home / Self Care | Payer: BLUE CROSS/BLUE SHIELD | Attending: Emergency Medicine | Admitting: Emergency Medicine

## 2015-11-06 DIAGNOSIS — M79642 Pain in left hand: Secondary | ICD-10-CM | POA: Diagnosis not present

## 2015-11-06 DIAGNOSIS — Z87440 Personal history of urinary (tract) infections: Secondary | ICD-10-CM | POA: Insufficient documentation

## 2015-11-06 DIAGNOSIS — M791 Myalgia, unspecified site: Secondary | ICD-10-CM

## 2015-11-06 DIAGNOSIS — Z8719 Personal history of other diseases of the digestive system: Secondary | ICD-10-CM | POA: Insufficient documentation

## 2015-11-06 DIAGNOSIS — G8929 Other chronic pain: Secondary | ICD-10-CM | POA: Diagnosis not present

## 2015-11-06 DIAGNOSIS — M542 Cervicalgia: Secondary | ICD-10-CM | POA: Insufficient documentation

## 2015-11-06 DIAGNOSIS — R2243 Localized swelling, mass and lump, lower limb, bilateral: Secondary | ICD-10-CM | POA: Insufficient documentation

## 2015-11-06 DIAGNOSIS — M79641 Pain in right hand: Secondary | ICD-10-CM | POA: Diagnosis not present

## 2015-11-06 DIAGNOSIS — Z79899 Other long term (current) drug therapy: Secondary | ICD-10-CM | POA: Diagnosis not present

## 2015-11-06 LAB — CBC WITH DIFFERENTIAL/PLATELET
Basophils Absolute: 0 10*3/uL (ref 0.0–0.1)
Basophils Relative: 0 %
Eosinophils Absolute: 0.1 10*3/uL (ref 0.0–0.7)
Eosinophils Relative: 1 %
HCT: 34.6 % — ABNORMAL LOW (ref 36.0–46.0)
Hemoglobin: 11.1 g/dL — ABNORMAL LOW (ref 12.0–15.0)
Lymphocytes Relative: 19 %
Lymphs Abs: 1.7 10*3/uL (ref 0.7–4.0)
MCH: 27.7 pg (ref 26.0–34.0)
MCHC: 32.1 g/dL (ref 30.0–36.0)
MCV: 86.3 fL (ref 78.0–100.0)
Monocytes Absolute: 0.5 10*3/uL (ref 0.1–1.0)
Monocytes Relative: 6 %
Neutro Abs: 6.4 10*3/uL (ref 1.7–7.7)
Neutrophils Relative %: 74 %
Platelets: 394 10*3/uL (ref 150–400)
RBC: 4.01 MIL/uL (ref 3.87–5.11)
RDW: 14 % (ref 11.5–15.5)
WBC: 8.8 10*3/uL (ref 4.0–10.5)

## 2015-11-06 LAB — COMPREHENSIVE METABOLIC PANEL
ALT: 8 U/L — ABNORMAL LOW (ref 14–54)
AST: 10 U/L — ABNORMAL LOW (ref 15–41)
Albumin: 3.2 g/dL — ABNORMAL LOW (ref 3.5–5.0)
Alkaline Phosphatase: 61 U/L (ref 38–126)
Anion gap: 9 (ref 5–15)
BUN: 8 mg/dL (ref 6–20)
CO2: 28 mmol/L (ref 22–32)
Calcium: 9.4 mg/dL (ref 8.9–10.3)
Chloride: 103 mmol/L (ref 101–111)
Creatinine, Ser: 0.99 mg/dL (ref 0.44–1.00)
GFR calc Af Amer: >60 mL/min (ref >60)
GFR calc non Af Amer: 59 mL/min — ABNORMAL LOW (ref >60)
Glucose, Bld: 111 mg/dL — ABNORMAL HIGH (ref 65–99)
Potassium: 4.3 mmol/L (ref 3.5–5.1)
Sodium: 140 mmol/L (ref 135–145)
Total Bilirubin: 0.3 mg/dL (ref 0.3–1.2)
Total Protein: 6.7 g/dL (ref 6.5–8.1)

## 2015-11-06 LAB — MAGNESIUM: Magnesium: 2 mg/dL (ref 1.7–2.4)

## 2015-11-06 MED ORDER — IBUPROFEN 400 MG PO TABS
400.0000 mg | ORAL_TABLET | Freq: Once | ORAL | Status: AC
  Administered 2015-11-06: 400 mg via ORAL
  Filled 2015-11-06: qty 1

## 2015-11-06 NOTE — ED Notes (Signed)
Pt's spouse came out of the room upset b/c "the time on this receipt is incorrect.. We were not even in the room until 7pm."  RN looked at the two receipts that registration gave him and the time stated 7:36 on one and 19:36 on the other.  RN explained that 19:36 was indeed correct b/c I was in the room when the transaction occurred.  Pt's husband stated that 19:36 was 6:36pm not 7:36.  RN explained that the [employer] time 19.36 was indeed correct.  Kyle EMT verified that 19.36 was indeed correct.  Visitor did return to the room.

## 2015-11-06 NOTE — ED Notes (Signed)
She reports neck pain over past weeks, increasingly worse since onset and now shes having pain in both arms and both legs. She states her fingers have been swollen and stiff as well. She is scheduled to have rotator cuff repair this week with Dr Tonita Cong at [facility] orthopedic. She denies bowel/bladder changes. Ambulatory with pain. shes been taking pain meds at home with no relief

## 2015-11-06 NOTE — ED Notes (Signed)
MD at bedside. 

## 2015-11-06 NOTE — ED Provider Notes (Signed)
CSN: [id number]     Arrival date & time 11/06/15  1534 History   First MD Initiated Contact with Patient 11/06/15 1907     Chief Complaint  Patient presents with  . Neck Pain     (Consider location/radiation/quality/duration/timing/severity/associated sxs/prior Treatment) HPI   Blood pressure 136/83, pulse 80, temperature 98 F (36.7 C), temperature source Oral, resp. rate 20, height 5\' 3"  (1.6 m), weight 70.534 kg, SpO2 100 %.  Jenny Glass is a 65 y.o. female complaining of diffuse severe pain onset several weeks ago. Patient has been taking Percocet every 6-8 hours as needed. She states that the achiness associated ear there is slowing her down and walking. She is ambulatory, but very slow and states that she "walks like an 65 year old." She is scheduled to have rotator cuff surgery this Thursday. Her orthopedist is given her several redness on burst with relief in the past for similar symptoms. She states that she feels like her hands and legs are swelling. She denies fever, chills, focal chest pain, focal abdominal pain.   Past Medical History  Diagnosis Date  . Chronic back pain   . UTI (lower urinary tract infection)   . Diverticulitis    History reviewed. No pertinent past surgical history. Family History  Problem Relation Age of Onset  . Cancer Mother   . Cancer Father    Social History  Substance Use Topics  . Smoking status: Never Smoker   . Smokeless tobacco: None  . Alcohol Use: No   OB History    No data available     Review of Systems  10 systems reviewed and found to be negative, except as noted in the HPI.   Allergies  Review of patient's allergies indicates no known allergies.  Home Medications   Prior to Admission medications   Medication Sig Start Date End Date Taking? Authorizing Provider  gabapentin (NEURONTIN) 300 MG capsule Take 300 mg by mouth 3 (three) times daily. 09/21/15   Historical Provider, MD  HYDROcodone-acetaminophen  (NORCO/VICODIN) 5-325 MG tablet Take 1 tablet by mouth every 6 (six) hours as needed. Pain 10/10/15   Historical Provider, MD  methocarbamol (ROBAXIN) 500 MG tablet Take 500 mg by mouth every 8 (eight) hours as needed. Muscle spasms 10/03/15   Historical Provider, MD  oxyCODONE-acetaminophen (PERCOCET/ROXICET) 5-325 MG tablet Take 1 tablet by mouth every 8 (eight) hours as needed. pain 10/17/15   Historical Provider, MD   BP 136/83 mmHg  Pulse 80  Temp(Src) 98 F (36.7 C) (Oral)  Resp 20  Ht 5\' 3"  (1.6 m)  Wt 70.534 kg  BMI 27.55 kg/m2  SpO2 100% Physical Exam  Constitutional: She is oriented to person, place, and time. She appears well-developed and well-nourished. No distress.  HENT:  Head: Normocephalic and atraumatic.  Mouth/Throat: Oropharynx is clear and moist.  Eyes: Conjunctivae and EOM are normal. Pupils are equal, round, and reactive to light.  Neck: Normal range of motion. Neck supple.  Cardiovascular: Normal rate, regular rhythm and intact distal pulses.   Pulmonary/Chest: Effort normal and breath sounds normal. No stridor.  Abdominal: Soft. There is no tenderness.  Musculoskeletal: Normal range of motion. She exhibits no edema or tenderness.  No focal joint tenderness, full range of motion. Patient has no focal midline tenderness palpation of the C-spine, she is diffusely tender to light palpation on the bilateral paraspinal musculature. Patient states she can't lift her hand to her head as she lifts her hand and touch the top of  her head. Distally neurovascularly intact with strong pulses 4, patient is ambulatory with a slow but coordinated in nonantalgic gait. Strength is 5 out of 54 extremities.  Neurological: She is alert and oriented to person, place, and time.  Skin: She is not diaphoretic.  Psychiatric: She has a normal mood and affect.  Nursing note and vitals reviewed.   ED Course  Procedures (including critical care time) Labs Review Labs Reviewed  CBC WITH  DIFFERENTIAL/PLATELET - Abnormal; Notable for the following:    Hemoglobin 11.1 (*)    HCT 34.6 (*)    All other components within normal limits  COMPREHENSIVE METABOLIC PANEL - Abnormal; Notable for the following:    Glucose, Bld 111 (*)    Albumin 3.2 (*)    AST 10 (*)    ALT 8 (*)    GFR calc non Af Amer 59 (*)    All other components within normal limits  MAGNESIUM    Imaging Review No results found. I have personally reviewed and evaluated these images and lab results as part of my medical decision-making.   EKG Interpretation None      MDM   Final diagnoses:  Myalgia    Filed Vitals:   11/06/15 1544 11/06/15 1828 11/06/15 2010  BP: 115/82 136/83 146/100  Pulse: 94 80 86  Temp: 98 F (36.7 C)  97.6 F (36.4 C)  TempSrc: Oral  Oral  Resp: 14 20 16   Height: 5\' 3"  (1.6 m)    Weight: 70.534 kg    SpO2: 96% 100% 98%    Medications  ibuprofen (ADVIL,MOTRIN) tablet 400 mg (400 mg Oral Given 11/06/15 1936)    Jenny Glass is 65 y.o. female presenting with "pain everywhere" feels that she is walking slowly, patient is ambulatory, neuro exam is non-concerning for spinal cord compression. This is not really a migratory arthritis, I think patient would benefit from rheumatology evaluation. I've advised her to follow closely with her primary care. It seems that orthopedists have been managing this with several series of prednisone burst. I don't feel that another series of steroids during this patient best interest, but advised her to take high-dose Motrin at home.  Discussed case with attending physician who agrees with care plan and disposition.   Evaluation does not show pathology that would require ongoing emergent intervention or inpatient treatment. Pt is hemodynamically stable and mentating appropriately. Discussed findings and plan with patient/guardian, who agrees with care plan. All questions answered. Return precautions discussed and outpatient follow up given.       Monico Blitz, PA-C 11/06/15 2109  Varney Biles, MD 11/07/15 2259

## 2015-11-06 NOTE — Discharge Instructions (Signed)
For pain control please take ibuprofen (also known as Motrin or Advil) 800mg  (this is normally 4 over the counter pills) 3 times a day  for 5 days. Take with food to minimize stomach irritation.  You can increase her Percocet and take 1-2 pills every 4-6 hours, do not drive or operate heavy machinery when you're taking Percocet.  Do not hesitate to return to the emergency room for any new, worsening or concerning symptoms.  Please obtain primary care using resource guide below. Let them know that you were seen in the emergency room and that they will need to obtain records for further outpatient management.   Musculoskeletal Pain Musculoskeletal pain is muscle and boney aches and pains. These pains can occur in any part of the body. Your caregiver may treat you without knowing the cause of the pain. They may treat you if blood or urine tests, X-rays, and other tests were normal.  CAUSES There is often not a definite cause or reason for these pains. These pains may be caused by a type of germ (virus). The discomfort may also come from overuse. Overuse includes working out too hard when your body is not fit. Boney aches also come from weather changes. Bone is sensitive to atmospheric pressure changes. HOME CARE INSTRUCTIONS   Ask when your test results will be ready. Make sure you get your test results.  Only take over-the-counter or prescription medicines for pain, discomfort, or fever as directed by your caregiver. If you were given medications for your condition, do not drive, operate machinery or power tools, or sign legal documents for 24 hours. Do not drink alcohol. Do not take sleeping pills or other medications that may interfere with treatment.  Continue all activities unless the activities cause more pain. When the pain lessens, slowly resume normal activities. Gradually increase the intensity and duration of the activities or exercise.  During periods of severe pain, bed rest may be  helpful. Lay or sit in any position that is comfortable.  Putting ice on the injured area.  Put ice in a bag.  Place a towel between your skin and the bag.  Leave the ice on for 15 to 20 minutes, 3 to 4 times a day.  Follow up with your caregiver for continued problems and no reason can be found for the pain. If the pain becomes worse or does not go away, it may be necessary to repeat tests or do additional testing. Your caregiver may need to look further for a possible cause. SEEK IMMEDIATE MEDICAL CARE IF:  You have pain that is getting worse and is not relieved by medications.  You develop chest pain that is associated with shortness or breath, sweating, feeling sick to your stomach (nauseous), or throw up (vomit).  Your pain becomes localized to the abdomen.  You develop any new symptoms that seem different or that concern you. MAKE SURE YOU:   Understand these instructions.  Will watch your condition.  Will get help right away if you are not doing well or get worse.   This information is not intended to replace advice given to you by your health care provider. Make sure you discuss any questions you have with your health care provider.   Document Released: 10/22/2005 Document Revised: 01/14/2012 Document Reviewed: 06/26/2013 Elsevier Interactive Patient Education 2016 [employer].   Emergency Department Resource Guide 1) Find a Doctor and Pay Out of Pocket Although you won't have to find out who is covered by your  insurance plan, it is a good idea to ask around and get recommendations. You will then need to call the office and see if the doctor you have chosen will accept you as a new patient and what types of options they offer for patients who are self-pay. Some doctors offer discounts or will set up payment plans for their patients who do not have insurance, but you will need to ask so you aren't surprised when you get to your appointment.  2) Contact Your Local Health  Department Not all health departments have doctors that can see patients for sick visits, but many do, so it is worth a call to see if yours does. If you don't know where your local health department is, you can check in your phone book. The CDC also has a tool to help you locate your state's health department, and many state websites also have listings of all of their local health departments.  3) Find a [place] [facility] If your illness is not likely to be very severe or complicated, you may want to try a walk in clinic. These are popping up all over the country in pharmacies, drugstores, and shopping centers. They're usually staffed by nurse practitioners or physician assistants that have been trained to treat common illnesses and complaints. They're usually fairly quick and inexpensive. However, if you have serious medical issues or chronic medical problems, these are probably not your best option.  No Primary Care Doctor: - Call Health Connect at  [phone] - they can help you locate a primary care doctor that  accepts your insurance, provides certain services, etc. - Physician Referral Service- [phone]  Chronic Pain Problems: Organization         Address  Phone   Notes  [facility]  [phone] Patients need to be referred by their primary care doctor.   Medication Assistance: Organization         Address  Phone   Notes  [facility] Medication [facility] [address] [place], [place] [address] [phone] --Must be a resident of [facility] -- Must have NO insurance coverage whatsoever (no Medicaid/ Medicare, etc.) -- The pt. MUST have a primary care doctor that directs their care regularly and follows them in the community   Med[facility]ist  [phone]   [employer]  [phone]    Agencies that provide inexpensive medical care: Organization         Address  Phone   Notes  [place]  [phone]   [facility]    [phone]   [facility] [place], [place] [address] [phone]   [place] [address], [place] [phone]   Planned Parenthood    [phone]   [facility]    [phone]   [place] and [place] [address], [place] Phone:  [phone], Fax:  [phone] Hours of Operation:  9 am - 6 pm, M-F.  Also accepts Medicaid/Medicare and self-pay.  [facility] for [address], [address], [place] Phone: [phone], Fax: [phone]. Hours of Operation:  8:30 am - 5:30 pm, M-F.  Also accepts Medicaid and self-pay.  [facility] [place] [address], [place] Phone: [phone]   [place], [place], [place] [phone], Ext. [phone] Mondays &  Thursdays: 7-9 AM.  First 15 patients are seen on a first come, first serve basis.    [place] Providers:  Organization         Address  Phone   Notes  [facility] [address], [place] [phone] Also accepts self-pay patients.  [facility] [id number] [place], [place]  [phone]   [place], [address], [place] [phone]   [facility] [address], [place] [phone]   Lucianne Lei [address], [place]   [phone] Only accepts [place] Access [place] patients after they have their name applied to their card.   Self-Pay (no insurance) in [facility]:  Organization         Address  Phone   Notes  Sickle Cell Patients, [facility] Internal [facility] [place] [phone]   [facility] Urgent Care [place] [phone]   [facility] [place]  [place], [place],  [place] [phone]   [facility]/Dr. Osei-Bonsu  [address], [place] or [address], [place] [phone] Phone number for both [place] and [place] locations is the same.  [facility] [address] [place] [phone]   [facility] [address] [place] or [address] [phone]   [facility] [address] [place] [phone], phone; [phone], fax Sees patients 1st and 3rd Saturday of every month.  Must not qualify for public or private insurance (i.e. Medicaid, Medicare, [place] Health Choice, Veterans' Benefits)  Household income should be no more than 200% of the poverty level The clinic cannot treat you if you are pregnant or think you are pregnant  Sexually transmitted diseases are not treated at the clinic.    Dental Care: Organization         Address  Phone  Notes  [facility] [place] [phone] Accepts children up to age 78 who are enrolled in [place] or [place]; pregnant women with a Medicaid card; and children who have applied for Medicaid or [place] Health Choice, but were declined, whose parents can pay a reduced fee at time of service.  [facility] Department of [facility]  [address], [place] [phone] Accepts children up to age 78 who are enrolled in [place] or [place]; pregnant women with a Medicaid card; and children who have applied for Medicaid or  Health Choice, but were declined, whose parents can pay a reduced fee at time of service.  [place] Adult Dental Access PROGRAM  [place] [phone] Patients are seen by appointment only. Walk-ins are not accepted. [place] will see patients 30 years of age and older. Monday - Tuesday (8am-5pm) Most Wednesdays  (8:30-5pm) $30 per visit, cash only  [facility] Adult Dental Access PROGRAM  [address], [facility] [phone] Patients are seen by appointment only. Walk-ins are not accepted. [place] will see patients 33 years of age and older. One Wednesday Evening (Monthly: Volunteer Based).  $30 per visit, cash only  [place]  [phone] for adults; Children under  age 58, call Graduate Pediatric Dentistry at [phone]. Children aged 34-14, please call [phone] to request a pediatric application.  Dental services are provided in all areas of dental care including fillings, crowns and bridges, complete and partial dentures, implants, gum treatment, root canals, and extractions. Preventive care is also provided. Treatment is provided to both adults and children. Patients are selected via a lottery and there is often a waiting list.   [facility] [address] [place]  [phone] [email or web address]   [facility] [address] [place], [place] [phone], Ext. [phone] Second and Fourth Thursday of each month, opens at 6:30 AM; Clinic ends at 9 AM.  Patients are seen on a first-come first-served basis, and a limited number are seen during each clinic.   [facility]  [address] Hillard Danker [place], [place] [phone]   Eligibility Requirements You must have lived in [place], [place], or [place] counties for at least the last three months.   You cannot be eligible for state or federal sponsored [employer], including [employer], [place], or [employer].   You generally cannot be eligible for healthcare insurance through your employer.    How to apply: Eligibility screenings are held every Tuesday and Wednesday afternoon from 1:00 pm until 4:00 pm. You do not need an appointment for the interview!  [facility] [address], [place], [place]   [place]  [place] Department  [place]  [phone]    Behavioral Health Resources in the Community: Intensive Outpatient Programs Organization         Address  Phone  Notes  [place]. [address] [place], [place] [phone]   [facility] [address] [place], [place]   ADS: Alcohol & Drug Svcs [address] [place], [place]   [place] [address],  [place], [place] or [phone]   Substance Abuse Resources Organization         Address  Phone  Notes  Alcohol and Drug Services  [phone]   [place]  [phone]   [facility] [place]   Chinita Pester  [phone]   Residential & Outpatient Substance Abuse Program  [phone]   Psychological Services Organization         Address  Phone  Notes  [facility] [place]  [place]  [phone]   [place] [address] [place] or [phone]    Mobile Crisis Teams Organization         Address  Phone  Notes  [facility], Mobile Crisis Care Unit  [phone]   [facility]  [address] [place], [place]   [place] Levels [address] [place] [phone]    Self-Help/Support Groups Organization         Address  Phone             Notes  [place] of [place] - variety of support groups  [place] Call for more information  Narcotics Anonymous (NA), [facility] [address], [employer] [place]  2 meetings at this location   Brewing technologist  Notes  ASAP Residential Treatment [phone] [place]  Mardene Speak [place]  [place]  [address], [place] [id number], [place], [place]   [place], [place] [phone] Admissions: 8am-3pm M-F  Incentives Substance [place] [address],    [place], [place] [phone]   [facility] [address] [place], [place], [place]   [facility] [address]  [place], [place]   [facility] Programs - Intensive Outpatient [address] Kristeen [address], [place], [place]   [facility] [place].,  [place], [place] [phone] or [phone]   [facility] Services (RTS) [address], [place], [place] Accepts Medicaid  [place] [address]  [place] [phone] Substance Abuse/Addiction Treatment   [facility] Organization         Address  Phone  Notes  [facility]  [phone]   Domenic Schwab, PhD [address] Arlis Porta [place], [place]   [phone] or [phone]   [place], [place] [phone]   [facility] [address], [place], [place] [phone] Insurance/Medicaid/sponsorship through [facility] and Families [address], [address] [place]                                    [place], [place] [phone] [place] [address], [place] [phone]    Dr. Adele Schilder  [phone]   [facility] Dept. 1) [address] [place] 2)  3)  [place] [address], [place] [phone]  [phone]   [place] [phone] or [phone] (After Hours)

## 2015-11-08 ENCOUNTER — Encounter (HOSPITAL_COMMUNITY)
Admission: RE | Admit: 2015-11-08 | Discharge: 2015-11-08 | Disposition: A | Discharge status: Home / Self Care | Payer: BLUE CROSS/BLUE SHIELD | Source: Ambulatory Visit | Attending: Specialist | Admitting: Specialist

## 2015-11-08 ENCOUNTER — Encounter (HOSPITAL_COMMUNITY): Payer: Self-pay

## 2015-11-08 DIAGNOSIS — M479 Spondylosis, unspecified: Secondary | ICD-10-CM | POA: Diagnosis not present

## 2015-11-08 DIAGNOSIS — G8929 Other chronic pain: Secondary | ICD-10-CM | POA: Diagnosis not present

## 2015-11-08 DIAGNOSIS — M19012 Primary osteoarthritis, left shoulder: Secondary | ICD-10-CM | POA: Diagnosis not present

## 2015-11-08 DIAGNOSIS — Z87891 Personal history of nicotine dependence: Secondary | ICD-10-CM | POA: Diagnosis not present

## 2015-11-08 DIAGNOSIS — M549 Dorsalgia, unspecified: Secondary | ICD-10-CM | POA: Diagnosis not present

## 2015-11-08 DIAGNOSIS — M65812 Other synovitis and tenosynovitis, left shoulder: Secondary | ICD-10-CM | POA: Diagnosis not present

## 2015-11-08 DIAGNOSIS — M4802 Spinal stenosis, cervical region: Secondary | ICD-10-CM | POA: Diagnosis not present

## 2015-11-08 DIAGNOSIS — M25512 Pain in left shoulder: Secondary | ICD-10-CM | POA: Diagnosis present

## 2015-11-08 DIAGNOSIS — M75122 Complete rotator cuff tear or rupture of left shoulder, not specified as traumatic: Secondary | ICD-10-CM | POA: Diagnosis not present

## 2015-11-08 HISTORY — DX: Anesthesia of skin: R20.0

## 2015-11-08 HISTORY — DX: Paresthesia of skin: R20.2

## 2015-11-08 LAB — BASIC METABOLIC PANEL
Anion gap: 12 (ref 5–15)
BUN: 11 mg/dL (ref 6–20)
CO2: 26 mmol/L (ref 22–32)
Calcium: 9.3 mg/dL (ref 8.9–10.3)
Chloride: 106 mmol/L (ref 101–111)
Creatinine, Ser: 0.76 mg/dL (ref 0.44–1.00)
GFR calc Af Amer: >60 mL/min (ref >60)
GFR calc non Af Amer: >60 mL/min (ref >60)
Glucose, Bld: 93 mg/dL (ref 65–99)
Potassium: 3.9 mmol/L (ref 3.5–5.1)
Sodium: 144 mmol/L (ref 135–145)

## 2015-11-08 LAB — CBC
HCT: 33.2 % — ABNORMAL LOW (ref 36.0–46.0)
Hemoglobin: 10.3 g/dL — ABNORMAL LOW (ref 12.0–15.0)
MCH: 26.7 pg (ref 26.0–34.0)
MCHC: 31 g/dL (ref 30.0–36.0)
MCV: 86 fL (ref 78.0–100.0)
Platelets: 368 10*3/uL (ref 150–400)
RBC: 3.86 MIL/uL — ABNORMAL LOW (ref 3.87–5.11)
RDW: 13.9 % (ref 11.5–15.5)
WBC: 8.9 10*3/uL (ref 4.0–10.5)

## 2015-11-08 NOTE — Patient Instructions (Signed)
Jenny Glass  11/08/2015   Your procedure is scheduled on: Thursday November 10, 2015   Report to [facility] Main  Entrance take [place]  elevators to 3rd floor to  Jenny Glass at 7:00 AM.  Call this number if you have problems the morning of surgery [phone]   Remember: ONLY 1 PERSON MAY GO WITH YOU TO SHORT STAY TO GET  READY MORNING OF [name].  Do not eat food or drink liquids :After Midnight.     Take these medicines the morning of surgery with A SIP OF WATER: Oxycodone-Acetaminophen if needed                                You may not have any metal on your body including hair pins and              piercings  Do not wear jewelry, make-up, lotions, powders or perfumes, deodorant             Do not wear nail polish.  Do not shave  48 hours prior to surgery.              Do not bring valuables to the hospital. [place].  Contacts, dentures or bridgework may not be worn into surgery.  Leave suitcase in the car. After surgery it may be brought to your room.              Please read over the following fact sheets you were given:INCENTIVE SPIROMETER  _____________________________________________________________________             [facility] - Preparing for Surgery Before surgery, you can play an important role.  Because skin is not sterile, your skin needs to be as free of germs as possible.  You can reduce the number of germs on your skin by washing with CHG (chlorahexidine gluconate) soap before surgery.  CHG is an antiseptic cleaner which kills germs and bonds with the skin to continue killing germs even after washing. Please DO NOT use if you have an allergy to CHG or antibacterial soaps.  If your skin becomes reddened/irritated stop using the CHG and inform your nurse when you arrive at Short Stay. Do not shave (including legs and underarms) for at least 48 hours prior to the first CHG shower.   You may shave your face/neck. Please follow these instructions carefully:  1.  Shower with CHG Soap the night before surgery and the  morning of Surgery.  2.  If you choose to wash your hair, wash your hair first as usual with your  normal  shampoo.  3.  After you shampoo, rinse your hair and body thoroughly to remove the  shampoo.                           4.  Use CHG as you would any other liquid soap.  You can apply chg directly  to the skin and wash                       Gently with a scrungie or clean washcloth.  5.  Apply the CHG Soap to your body ONLY  FROM THE NECK DOWN.   Do not use on face/ open                           Wound or open sores. Avoid contact with eyes, ears mouth and genitals (private parts).                       Wash face,  Genitals (private parts) with your normal soap.             6.  Wash thoroughly, paying special attention to the area where your surgery  will be performed.  7.  Thoroughly rinse your body with warm water from the neck down.  8.  DO NOT shower/wash with your normal soap after using and rinsing off  the CHG Soap.                9.  Pat yourself dry with a clean towel.            10.  Wear clean pajamas.            11.  Place clean sheets on your bed the night of your first shower and do not  sleep with pets. Day of Surgery : Do not apply any lotions/deodorants the morning of surgery.  Please wear clean clothes to the hospital/surgery center.  FAILURE TO FOLLOW THESE INSTRUCTIONS MAY RESULT IN THE CANCELLATION OF YOUR SURGERY PATIENT SIGNATURE_________________________________  NURSE SIGNATURE__________________________________  ________________________________________________________________________   Adam Phenix  An incentive spirometer is a tool that can help keep your lungs clear and active. This tool measures how well you are filling your lungs with each breath. Taking long deep breaths may help reverse or decrease the chance of  developing breathing (pulmonary) problems (especially infection) following:  A long period of time when you are unable to move or be active. BEFORE THE PROCEDURE   If the spirometer includes an indicator to show your best effort, your nurse or respiratory therapist will set it to a desired goal.  If possible, sit up straight or lean slightly forward. Try not to slouch.  Hold the incentive spirometer in an upright position. INSTRUCTIONS FOR USE  1. Sit on the edge of your bed if possible, or sit up as far as you can in bed or on a chair. 2. Hold the incentive spirometer in an upright position. 3. Breathe out normally. 4. Place the mouthpiece in your mouth and seal your lips tightly around it. 5. Breathe in slowly and as deeply as possible, raising the piston or the ball toward the top of the column. 6. Hold your breath for 3-5 seconds or for as long as possible. Allow the piston or ball to fall to the bottom of the column. 7. Remove the mouthpiece from your mouth and breathe out normally. 8. Rest for a few seconds and repeat Steps 1 through 7 at least 10 times every 1-2 hours when you are awake. Take your time and take a few normal breaths between deep breaths. 9. The spirometer may include an indicator to show your best effort. Use the indicator as a goal to work toward during each repetition. 10. After each set of 10 deep breaths, practice coughing to be sure your lungs are clear. If you have an incision (the cut made at the time of surgery), support your incision when coughing by placing a pillow or rolled up towels firmly against it. Once you  are able to get out of bed, walk around indoors and cough well. You may stop using the incentive spirometer when instructed by your caregiver.  RISKS AND COMPLICATIONS  Take your time so you do not get dizzy or light-headed.  If you are in pain, you may need to take or ask for pain medication before doing incentive spirometry. It is harder to take a  deep breath if you are having pain. AFTER USE  Rest and breathe slowly and easily.  It can be helpful to keep track of a log of your progress. Your caregiver can provide you with a simple table to help with this. If you are using the spirometer at home, follow these instructions: [place] IF:   You are having difficultly using the spirometer.  You have trouble using the spirometer as often as instructed.  Your pain medication is not giving enough relief while using the spirometer.  You develop fever of 100.5 F (38.1 C) or higher. SEEK IMMEDIATE MEDICAL CARE IF:   You cough up bloody sputum that had not been present before.  You develop fever of 102 F (38.9 C) or greater.  You develop worsening pain at or near the incision site. MAKE SURE YOU:   Understand these instructions.  Will watch your condition.  Will get help right away if you are not doing well or get worse. Document Released: 03/04/2007 Document Revised: 01/14/2012 Document Reviewed: 05/05/2007 [facility] Patient Information 2014 [place], [place].   ________________________________________________________________________

## 2015-11-09 NOTE — Progress Notes (Addendum)
CBC results in epic per PAT visit 11/08/2015 sent to Dr Tonita Cong

## 2015-11-10 ENCOUNTER — Ambulatory Visit (HOSPITAL_COMMUNITY): Payer: BLUE CROSS/BLUE SHIELD | Admitting: Certified Registered Nurse Anesthetist

## 2015-11-10 ENCOUNTER — Encounter (HOSPITAL_COMMUNITY): Payer: Self-pay | Admitting: *Deleted

## 2015-11-10 ENCOUNTER — Encounter (HOSPITAL_COMMUNITY)
Admission: RE | Disposition: A | Discharge status: Home / Self Care | Payer: Self-pay | Source: Ambulatory Visit | Attending: Specialist

## 2015-11-10 ENCOUNTER — Observation Stay (HOSPITAL_COMMUNITY)
Admission: RE | Admit: 2015-11-10 | Discharge: 2015-11-11 | Disposition: A | Discharge status: Home / Self Care | Payer: BLUE CROSS/BLUE SHIELD | Source: Ambulatory Visit | Attending: Specialist | Admitting: Specialist

## 2015-11-10 DIAGNOSIS — M4802 Spinal stenosis, cervical region: Secondary | ICD-10-CM | POA: Insufficient documentation

## 2015-11-10 DIAGNOSIS — M75122 Complete rotator cuff tear or rupture of left shoulder, not specified as traumatic: Secondary | ICD-10-CM | POA: Diagnosis not present

## 2015-11-10 DIAGNOSIS — M549 Dorsalgia, unspecified: Secondary | ICD-10-CM | POA: Insufficient documentation

## 2015-11-10 DIAGNOSIS — G8929 Other chronic pain: Secondary | ICD-10-CM | POA: Insufficient documentation

## 2015-11-10 DIAGNOSIS — M19012 Primary osteoarthritis, left shoulder: Secondary | ICD-10-CM | POA: Insufficient documentation

## 2015-11-10 DIAGNOSIS — M479 Spondylosis, unspecified: Secondary | ICD-10-CM | POA: Insufficient documentation

## 2015-11-10 DIAGNOSIS — Z87891 Personal history of nicotine dependence: Secondary | ICD-10-CM | POA: Insufficient documentation

## 2015-11-10 DIAGNOSIS — M75100 Unspecified rotator cuff tear or rupture of unspecified shoulder, not specified as traumatic: Secondary | ICD-10-CM | POA: Diagnosis present

## 2015-11-10 DIAGNOSIS — M65812 Other synovitis and tenosynovitis, left shoulder: Secondary | ICD-10-CM | POA: Insufficient documentation

## 2015-11-10 HISTORY — PX: SHOULDER OPEN ROTATOR CUFF REPAIR: SHX2407

## 2015-11-10 SURGERY — REPAIR, ROTATOR CUFF, OPEN
Anesthesia: Regional | Site: Shoulder | Laterality: Left

## 2015-11-10 MED ORDER — HYDROMORPHONE HCL 1 MG/ML IJ SOLN: 0.2500 mg | INTRAMUSCULAR | Status: DC | PRN

## 2015-11-10 MED ORDER — CEFAZOLIN SODIUM-DEXTROSE 2-3 GM-% IV SOLR
2.0000 g | INTRAVENOUS | Status: AC
  Administered 2015-11-10: 2 g via INTRAVENOUS

## 2015-11-10 MED ORDER — ONDANSETRON HCL 4 MG/2ML IJ SOLN
INTRAMUSCULAR | Status: AC
  Filled 2015-11-10: qty 2

## 2015-11-10 MED ORDER — RISAQUAD PO CAPS
1.0000 | ORAL_CAPSULE | Freq: Every day | ORAL | Status: DC
  Administered 2015-11-11: 1 via ORAL
  Filled 2015-11-10 (×2): qty 1

## 2015-11-10 MED ORDER — GABAPENTIN 300 MG PO CAPS
300.0000 mg | ORAL_CAPSULE | Freq: Three times a day (TID) | ORAL | Status: DC
  Administered 2015-11-10 – 2015-11-11 (×2): 300 mg via ORAL
  Filled 2015-11-10 (×4): qty 1

## 2015-11-10 MED ORDER — SENNOSIDES-DOCUSATE SODIUM 8.6-50 MG PO TABS: 1.0000 | ORAL_TABLET | Freq: Every evening | ORAL | Status: DC | PRN

## 2015-11-10 MED ORDER — MAGNESIUM CITRATE PO SOLN: 1.0000 | Freq: Once | ORAL | Status: DC | PRN

## 2015-11-10 MED ORDER — PHENYLEPHRINE HCL 10 MG/ML IJ SOLN
INTRAMUSCULAR | Status: AC
  Filled 2015-11-10: qty 1

## 2015-11-10 MED ORDER — METOCLOPRAMIDE HCL 10 MG PO TABS: 5.0000 mg | ORAL_TABLET | Freq: Three times a day (TID) | ORAL | Status: DC | PRN

## 2015-11-10 MED ORDER — DEXAMETHASONE SODIUM PHOSPHATE 10 MG/ML IJ SOLN
INTRAMUSCULAR | Status: AC
  Filled 2015-11-10: qty 1

## 2015-11-10 MED ORDER — BISACODYL 5 MG PO TBEC: 5.0000 mg | DELAYED_RELEASE_TABLET | Freq: Every day | ORAL | Status: DC | PRN

## 2015-11-10 MED ORDER — KETOROLAC TROMETHAMINE 15 MG/ML IJ SOLN
15.0000 mg | Freq: Four times a day (QID) | INTRAMUSCULAR | Status: DC | PRN
  Filled 2015-11-10: qty 1

## 2015-11-10 MED ORDER — DEXAMETHASONE SODIUM PHOSPHATE 4 MG/ML IJ SOLN
INTRAMUSCULAR | Status: DC | PRN
  Administered 2015-11-10: 10 mg via INTRAVENOUS

## 2015-11-10 MED ORDER — ACETAMINOPHEN 160 MG/5ML PO SOLN: 325.0000 mg | ORAL | Status: DC | PRN

## 2015-11-10 MED ORDER — PROPOFOL 10 MG/ML IV BOLUS
INTRAVENOUS | Status: DC | PRN
  Administered 2015-11-10: 120 mg via INTRAVENOUS

## 2015-11-10 MED ORDER — ROCURONIUM BROMIDE 100 MG/10ML IV SOLN
INTRAVENOUS | Status: AC
  Filled 2015-11-10: qty 1

## 2015-11-10 MED ORDER — SUGAMMADEX SODIUM 200 MG/2ML IV SOLN
INTRAVENOUS | Status: AC
  Filled 2015-11-10: qty 2

## 2015-11-10 MED ORDER — ROCURONIUM BROMIDE 100 MG/10ML IV SOLN
INTRAVENOUS | Status: DC | PRN
  Administered 2015-11-10: 10 mg via INTRAVENOUS
  Administered 2015-11-10: 30 mg via INTRAVENOUS
  Administered 2015-11-10: 10 mg via INTRAVENOUS

## 2015-11-10 MED ORDER — CEFAZOLIN SODIUM-DEXTROSE 2-3 GM-% IV SOLR
INTRAVENOUS | Status: AC
  Filled 2015-11-10: qty 50

## 2015-11-10 MED ORDER — ROPIVACAINE HCL 5 MG/ML IJ SOLN
INTRAMUSCULAR | Status: AC
  Filled 2015-11-10: qty 30

## 2015-11-10 MED ORDER — ONDANSETRON HCL 4 MG PO TABS: 4.0000 mg | ORAL_TABLET | Freq: Four times a day (QID) | ORAL | Status: DC | PRN

## 2015-11-10 MED ORDER — CEFAZOLIN SODIUM 1-5 GM-% IV SOLN
1.0000 g | Freq: Four times a day (QID) | INTRAVENOUS | Status: AC
  Administered 2015-11-10 (×2): 1 g via INTRAVENOUS
  Filled 2015-11-10 (×2): qty 50

## 2015-11-10 MED ORDER — ACETAMINOPHEN 325 MG PO TABS
650.0000 mg | ORAL_TABLET | Freq: Four times a day (QID) | ORAL | Status: DC | PRN
  Administered 2015-11-10: 650 mg via ORAL
  Filled 2015-11-10: qty 2

## 2015-11-10 MED ORDER — ACETAMINOPHEN 650 MG RE SUPP: 650.0000 mg | Freq: Four times a day (QID) | RECTAL | Status: DC | PRN

## 2015-11-10 MED ORDER — METOCLOPRAMIDE HCL 5 MG/ML IJ SOLN
INTRAMUSCULAR | Status: DC | PRN
  Administered 2015-11-10: 10 mg via INTRAVENOUS

## 2015-11-10 MED ORDER — DOCUSATE SODIUM 100 MG PO CAPS
100.0000 mg | ORAL_CAPSULE | Freq: Two times a day (BID) | ORAL | Status: DC
  Administered 2015-11-10 – 2015-11-11 (×2): 100 mg via ORAL

## 2015-11-10 MED ORDER — ONDANSETRON HCL 4 MG/2ML IJ SOLN
INTRAMUSCULAR | Status: DC | PRN
  Administered 2015-11-10: 4 mg via INTRAVENOUS

## 2015-11-10 MED ORDER — GLYCOPYRROLATE 0.2 MG/ML IJ SOLN
INTRAMUSCULAR | Status: DC | PRN
  Administered 2015-11-10: 0.1 mg via INTRAVENOUS

## 2015-11-10 MED ORDER — BUPIVACAINE-EPINEPHRINE 0.5% -1:200000 IJ SOLN
INTRAMUSCULAR | Status: DC | PRN
  Administered 2015-11-10: 10 mL

## 2015-11-10 MED ORDER — PHENYLEPHRINE 40 MCG/ML (10ML) SYRINGE FOR IV PUSH (FOR BLOOD PRESSURE SUPPORT)
PREFILLED_SYRINGE | INTRAVENOUS | Status: AC
  Filled 2015-11-10: qty 10

## 2015-11-10 MED ORDER — LACTATED RINGERS IV SOLN: INTRAVENOUS | Status: DC

## 2015-11-10 MED ORDER — KCL IN DEXTROSE-NACL 20-5-0.45 MEQ/L-%-% IV SOLN
INTRAVENOUS | Status: AC
  Administered 2015-11-10: 16:00:00 via INTRAVENOUS
  Filled 2015-11-10 (×2): qty 1000

## 2015-11-10 MED ORDER — HYDROMORPHONE HCL 1 MG/ML IJ SOLN
1.0000 mg | INTRAMUSCULAR | Status: DC | PRN
  Administered 2015-11-10: 1 mg via INTRAVENOUS
  Filled 2015-11-10: qty 1

## 2015-11-10 MED ORDER — SODIUM CHLORIDE 0.9 % IR SOLN
Status: AC
  Filled 2015-11-10: qty 1

## 2015-11-10 MED ORDER — ONDANSETRON HCL 4 MG/2ML IJ SOLN: 4.0000 mg | Freq: Four times a day (QID) | INTRAMUSCULAR | Status: DC | PRN

## 2015-11-10 MED ORDER — METOCLOPRAMIDE HCL 5 MG/ML IJ SOLN
INTRAMUSCULAR | Status: AC
  Filled 2015-11-10: qty 2

## 2015-11-10 MED ORDER — CALCIUM CARBONATE-VITAMIN D 500-200 MG-UNIT PO TABS
1.0000 | ORAL_TABLET | Freq: Every day | ORAL | Status: DC
  Administered 2015-11-11: 1 via ORAL
  Filled 2015-11-10 (×2): qty 1

## 2015-11-10 MED ORDER — SODIUM CHLORIDE 0.9 % IJ SOLN
INTRAMUSCULAR | Status: AC
  Filled 2015-11-10: qty 10

## 2015-11-10 MED ORDER — SODIUM CHLORIDE 0.9 % IR SOLN
Status: DC | PRN
  Administered 2015-11-10: 500 mL

## 2015-11-10 MED ORDER — SUGAMMADEX SODIUM 500 MG/5ML IV SOLN
INTRAVENOUS | Status: DC | PRN
  Administered 2015-11-10 (×2): 100 mg via INTRAVENOUS

## 2015-11-10 MED ORDER — METHOCARBAMOL 500 MG PO TABS
500.0000 mg | ORAL_TABLET | Freq: Four times a day (QID) | ORAL | Status: DC | PRN
  Administered 2015-11-10 – 2015-11-11 (×3): 500 mg via ORAL
  Filled 2015-11-10 (×3): qty 1

## 2015-11-10 MED ORDER — LACTATED RINGERS IV SOLN
INTRAVENOUS | Status: DC | PRN
  Administered 2015-11-10 (×2): via INTRAVENOUS

## 2015-11-10 MED ORDER — BUPIVACAINE-EPINEPHRINE (PF) 0.5% -1:200000 IJ SOLN
INTRAMUSCULAR | Status: AC
  Filled 2015-11-10: qty 30

## 2015-11-10 MED ORDER — METHOCARBAMOL 1000 MG/10ML IJ SOLN
500.0000 mg | Freq: Four times a day (QID) | INTRAVENOUS | Status: DC | PRN
  Filled 2015-11-10: qty 5

## 2015-11-10 MED ORDER — ACETAMINOPHEN 325 MG PO TABS: 325.0000 mg | ORAL_TABLET | ORAL | Status: DC | PRN

## 2015-11-10 MED ORDER — METOCLOPRAMIDE HCL 5 MG/ML IJ SOLN: 5.0000 mg | Freq: Three times a day (TID) | INTRAMUSCULAR | Status: DC | PRN

## 2015-11-10 MED ORDER — METHOCARBAMOL 500 MG PO TABS: 500.0000 mg | ORAL_TABLET | Freq: Three times a day (TID) | ORAL | Status: DC | PRN

## 2015-11-10 MED ORDER — SUCCINYLCHOLINE CHLORIDE 20 MG/ML IJ SOLN
INTRAMUSCULAR | Status: DC | PRN
  Administered 2015-11-10: 100 mg via INTRAVENOUS

## 2015-11-10 MED ORDER — LIDOCAINE HCL (CARDIAC) 20 MG/ML IV SOLN
INTRAVENOUS | Status: DC | PRN
  Administered 2015-11-10: 50 mg via INTRAVENOUS

## 2015-11-10 MED ORDER — DEXTROSE 5 % IV SOLN
10.0000 mg | INTRAVENOUS | Status: DC | PRN
  Administered 2015-11-10: 50 ug/min via INTRAVENOUS

## 2015-11-10 MED ORDER — MENTHOL 3 MG MT LOZG: 1.0000 | LOZENGE | OROMUCOSAL | Status: DC | PRN

## 2015-11-10 MED ORDER — GLYCOPYRROLATE 0.2 MG/ML IJ SOLN
INTRAMUSCULAR | Status: AC
  Filled 2015-11-10: qty 1

## 2015-11-10 MED ORDER — OXYCODONE HCL 5 MG/5ML PO SOLN: 5.0000 mg | Freq: Once | ORAL | Status: DC | PRN

## 2015-11-10 MED ORDER — PHENYLEPHRINE HCL 10 MG/ML IJ SOLN
INTRAMUSCULAR | Status: DC | PRN
  Administered 2015-11-10: 40 ug via INTRAVENOUS
  Administered 2015-11-10: 120 ug via INTRAVENOUS
  Administered 2015-11-10 (×2): 80 ug via INTRAVENOUS
  Administered 2015-11-10: 40 ug via INTRAVENOUS

## 2015-11-10 MED ORDER — DOCUSATE SODIUM 100 MG PO CAPS: 100.0000 mg | ORAL_CAPSULE | Freq: Two times a day (BID) | ORAL | Status: DC | PRN

## 2015-11-10 MED ORDER — FENTANYL CITRATE (PF) 250 MCG/5ML IJ SOLN
INTRAMUSCULAR | Status: AC
  Filled 2015-11-10: qty 5

## 2015-11-10 MED ORDER — PROPOFOL 10 MG/ML IV BOLUS
INTRAVENOUS | Status: AC
  Filled 2015-11-10: qty 20

## 2015-11-10 MED ORDER — EPHEDRINE SULFATE 50 MG/ML IJ SOLN
INTRAMUSCULAR | Status: AC
  Filled 2015-11-10: qty 1

## 2015-11-10 MED ORDER — ENOXAPARIN SODIUM 40 MG/0.4ML ~~LOC~~ SOLN
40.0000 mg | SUBCUTANEOUS | Status: DC
  Administered 2015-11-11: 40 mg via SUBCUTANEOUS
  Filled 2015-11-10 (×2): qty 0.4

## 2015-11-10 MED ORDER — OXYCODONE HCL 5 MG PO TABS: 5.0000 mg | ORAL_TABLET | Freq: Once | ORAL | Status: DC | PRN

## 2015-11-10 MED ORDER — FENTANYL CITRATE (PF) 100 MCG/2ML IJ SOLN
INTRAMUSCULAR | Status: AC
  Filled 2015-11-10: qty 2

## 2015-11-10 MED ORDER — PHENOL 1.4 % MT LIQD: 1.0000 | OROMUCOSAL | Status: DC | PRN

## 2015-11-10 MED ORDER — LIDOCAINE HCL (CARDIAC) 20 MG/ML IV SOLN
INTRAVENOUS | Status: AC
  Filled 2015-11-10: qty 5

## 2015-11-10 MED ORDER — EPHEDRINE SULFATE 50 MG/ML IJ SOLN
INTRAMUSCULAR | Status: DC | PRN
  Administered 2015-11-10 (×3): 5 mg via INTRAVENOUS

## 2015-11-10 MED ORDER — FENTANYL CITRATE (PF) 100 MCG/2ML IJ SOLN
INTRAMUSCULAR | Status: DC | PRN
  Administered 2015-11-10 (×3): 50 ug via INTRAVENOUS
  Administered 2015-11-10: 100 ug via INTRAVENOUS

## 2015-11-10 MED ORDER — CALCIUM + D3 600-200 MG-UNIT PO TABS: ORAL_TABLET | Freq: Every day | ORAL | Status: DC

## 2015-11-10 MED ORDER — OXYCODONE HCL 5 MG PO TABS
5.0000 mg | ORAL_TABLET | ORAL | Status: DC | PRN
  Administered 2015-11-10 (×2): 5 mg via ORAL
  Administered 2015-11-11 (×2): 10 mg via ORAL
  Filled 2015-11-10: qty 1
  Filled 2015-11-10: qty 2
  Filled 2015-11-10: qty 1
  Filled 2015-11-10: qty 2

## 2015-11-10 MED ORDER — BUPIVACAINE-EPINEPHRINE (PF) 0.5% -1:200000 IJ SOLN
INTRAMUSCULAR | Status: DC | PRN
  Administered 2015-11-10: 30 mL via PERINEURAL

## 2015-11-10 MED ORDER — OXYCODONE-ACETAMINOPHEN 5-325 MG PO TABS
1.0000 | ORAL_TABLET | ORAL | Status: DC | PRN
Start: 2015-11-10 — End: 2022-05-01

## 2015-11-10 SURGICAL SUPPLY — 51 items
ANCH SUT SWLK 19.1X4.75 VT (Anchor) ×3 IMPLANT
ANCHOR NDL 9/16 CIR SZ 8 (NEEDLE) IMPLANT
ANCHOR NEEDLE 9/16 CIR SZ 8 (NEEDLE) IMPLANT
ANCHOR PEEK 4.75X19.1 SWLK C (Anchor) ×7 IMPLANT
BAG SPEC THK2 15X12 ZIP CLS (MISCELLANEOUS)
BAG ZIPLOCK 12X15 (MISCELLANEOUS) IMPLANT
BLADE OSCILLATING/SAGITTAL (BLADE) ×2
BLADE SW THK.38XMED LNG THN (BLADE) IMPLANT
CLOTH 2% CHLOROHEXIDINE 3PK (PERSONAL CARE ITEMS) ×2 IMPLANT
DRAPE POUCH INSTRU U-SHP 10X18 (DRAPES) ×2 IMPLANT
DRSG AQUACEL AG ADV 3.5X 4 (GAUZE/BANDAGES/DRESSINGS) IMPLANT
DRSG AQUACEL AG ADV 3.5X 6 (GAUZE/BANDAGES/DRESSINGS) ×1 IMPLANT
DRSG AQUACEL AG ADV 3.5X10 (GAUZE/BANDAGES/DRESSINGS) IMPLANT
DURAPREP 26ML APPLICATOR (WOUND CARE) ×2 IMPLANT
ELECT NDL TIP 2.8 STRL (NEEDLE) ×1 IMPLANT
ELECT NEEDLE TIP 2.8 STRL (NEEDLE) ×2 IMPLANT
ELECT REM PT RETURN 9FT ADLT (ELECTROSURGICAL) ×2
ELECTRODE REM PT RTRN 9FT ADLT (ELECTROSURGICAL) ×1 IMPLANT
GLOVE BIOGEL PI IND STRL 7.0 (GLOVE) ×1 IMPLANT
GLOVE BIOGEL PI IND STRL 8 (GLOVE) ×1 IMPLANT
GLOVE BIOGEL PI INDICATOR 7.0 (GLOVE) ×1
GLOVE BIOGEL PI INDICATOR 8 (GLOVE) ×1
GLOVE SURG SS PI 7.0 STRL IVOR (GLOVE) ×2 IMPLANT
GLOVE SURG SS PI 7.5 STRL IVOR (GLOVE) ×2 IMPLANT
GLOVE SURG SS PI 8.0 STRL IVOR (GLOVE) ×2 IMPLANT
GOWN STRL REUS W/TWL XL LVL3 (GOWN DISPOSABLE) ×4 IMPLANT
KIT BASIN OR (CUSTOM PROCEDURE TRAY) ×2 IMPLANT
KIT POSITION SHOULDER SCHLEI (MISCELLANEOUS) ×2 IMPLANT
MANIFOLD NEPTUNE II (INSTRUMENTS) ×2 IMPLANT
MARKER SKIN DUAL TIP RULER LAB (MISCELLANEOUS) ×2 IMPLANT
NDL SCORPION MULTI FIRE (NEEDLE) IMPLANT
NEEDLE SCORPION MULTI FIRE (NEEDLE) ×2 IMPLANT
PACK SHOULDER (CUSTOM PROCEDURE TRAY) ×2 IMPLANT
POSITIONER SURGICAL ARM (MISCELLANEOUS) ×2 IMPLANT
SLING ARM IMMOBILIZER LRG (SOFTGOODS) ×1 IMPLANT
SLING ULTRA II L (ORTHOPEDIC SUPPLIES) IMPLANT
STRIP CLOSURE SKIN 1/2X4 (GAUZE/BANDAGES/DRESSINGS) ×2 IMPLANT
SUCTION FRAZIER 12FR DISP (SUCTIONS) ×1 IMPLANT
SUT BONE WAX W31G (SUTURE) IMPLANT
SUT ETHIBOND NAB CT1 #1 30IN (SUTURE) IMPLANT
SUT FIBERWIRE #2 38 T-5 BLUE (SUTURE)
SUT PROLENE 3 0 PS 2 (SUTURE) ×2 IMPLANT
SUT TIGER TAPE 7 IN WHITE (SUTURE) IMPLANT
SUT VIC AB 1-0 CT2 27 (SUTURE) ×3 IMPLANT
SUT VIC AB 2-0 CT2 27 (SUTURE) ×2 IMPLANT
SUT VICRYL 0-0 OS 2 NEEDLE (SUTURE) IMPLANT
SUTURE FIBERWR #2 38 T-5 BLUE (SUTURE) IMPLANT
TAPE FIBER 2MM 7IN #2 BLUE (SUTURE) ×1 IMPLANT
TOWEL OR 17X26 10 PK STRL BLUE (TOWEL DISPOSABLE) ×2 IMPLANT
TOWEL OR NON WOVEN STRL DISP B (DISPOSABLE) ×2 IMPLANT
YANKAUER SUCT BULB TIP NO VENT (SUCTIONS) ×2 IMPLANT

## 2015-11-10 NOTE — H&P (View-Only) (Signed)
Jenny Glass is an 65 y.o. female.   Chief Complaint: L shoulder pain HPI: The patient is a 65 year old female who presents today for follow up of their neck. The patient is being followed for their left-sided neck pain and left shoulder pain. They are 3 1/2 months out from most recent flare up. Symptoms reported today include: pain and weakness. The patient feels that they are doing poorly. Current treatment includes: relative rest, activity modification and pain medications. The following medication has been used for pain control: Norco (and gabapentin and Robaxin). The patient presents today following MRI.  Past Medical History  Diagnosis Date  . Chronic back pain   . UTI (lower urinary tract infection)   . Diverticulitis     No past surgical history on file.  Family History  Problem Relation Age of Onset  . Cancer Mother   . Cancer Father    Social History:  reports that she has quit smoking. She does not have any smokeless tobacco history on file. She reports that she does not drink alcohol or use illicit drugs.  Allergies: No Known Allergies   (Not in a Glass admission)  No results found for this or any previous visit (from the past 48 hour(s)). No results found.  Review of Systems  Constitutional: Negative.   Eyes: Negative.   Respiratory: Negative.   Cardiovascular: Negative.   Gastrointestinal: Negative.   Genitourinary: Negative.   Musculoskeletal: Positive for back pain, joint pain and neck pain.  Skin: Negative.   Neurological: Positive for sensory change and focal weakness.  Psychiatric/Behavioral: Negative.     There were no vitals taken for this visit. Physical Exam  Constitutional: She is oriented to person, place, and time. She appears well-developed. She appears distressed.  HENT:  Head: Normocephalic.  Eyes: Pupils are equal, round, and reactive to light.  Neck: Normal range of motion.  Cardiovascular: Normal rate.   Respiratory: Effort normal.   GI: Soft.  Musculoskeletal:  On exam, positive impingement sign, positive secondary impingement sign of the shoulder. Decreased internal rotation. Tender over the [facility], positive cross over maneuver. Sensory exam is intact. 5-/5 in the grip strength secondary to hand pain. Hyperreflexic. No DVT. Some pain with extension of the cervical spine.  Lumbar spine, straight leg raise negative. Motor is 5/5. No DVT. No instability of hips, knees and ankles.  Neurological: She is alert and oriented to person, place, and time.  Skin: Skin is warm and dry.    MRI of her shoulder demonstrates a full thickness tear of the rotator cuff. Severe AC arthrosis. Mild fatty atrophy of the supraspinatus, high grade tear of the superior portion of the subscap, medial subluxation of the biceps, tendinosis of the biceps, severe AC arthrosis. Glenohumeral arthrosis. Degenerative labral tear.  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  Assessment/Plan 1. Rotator cuff tear, full thickness, supraspinatus, infraspinatus, superior portion of the subscap with biceps tendon subluxation associated AC arthrosis. 2. Positional radicular pain, secondary to secondary severe cervical foraminal stenosis at C3-4 and at C4-5. 3. Mechanical back pain secondary to lumbar spondylosis, disc degeneration.  We discussed options in extensive detail. She also had an epidural at L5-S1 for diagnostic therapeutic purposes that helped to some degree.  At this point in time, given her full thickness tear, the severity of her pain, limitation of her range of motion, proceed with mini open rotator cuff repair, distal clavicle resection, perhaps a repair  of the biceps anchor complex in the superior portion of the subscap.  I had a long discussion with the patient concerning the risks and benefits of a rotator cuff repair, including bleeding, infection, prolonged postoperative recovery, which may require 3  to 5 months until maximum medical improvement. Overight procedure with initiation of early passive range of motion within physical therapy. Avoid any active motion for the first six weeks. This is all in an effort to avoid recurrent tear of the rotator cuff and adhesive capsulitis. Return to work without use of the arm can be obtained following two weeks. However, driving will be a challenge. We also discussed the possibility of requiring implants including bone anchors,as well as an Allograft patch graft if a massive rotator cuff tear is encountered. Removal of any bones for spurs as well as bursitis will be performed during the procedure and also any associated anesthetic complications as well.  Discussed positional modifications for cervical spine. Avoid extension, favor flexion. If persistent, we discussed selective nerve root block. No neurologic deficit. We will favor conservative treatment. She reports significantly more problems with her shoulder pain now than her back or her neck.  In terms of her lumbar spine, avoid extension and flexion, unsupported bending, better from her injection. We will continue with conservative management for that. Gave her a prescription by her request of Percocet as the hydrocodone is not helping. She has an increase in the Neurontin to two or three times a day as tolerated. No history of DVT. She is here with her husband. We will schedule her accordingly.  Plan left shoulder mini-open RCR/DCR  BISSELL, JACLYN M. PA-C for Dr. Tonita Cong 11/02/2015, 2:09 PM

## 2015-11-10 NOTE — Brief Op Note (Signed)
11/10/2015  11:09 AM  PATIENT:  Jenny Glass  65 y.o. female  PRE-OPERATIVE DIAGNOSIS:  LEFT ROTATOR CUFF TEAR AND AC ARTHROSIS  POST-OPERATIVE DIAGNOSIS:  * No post-op diagnosis entered *  PROCEDURE:  Procedure(s): LEFT SHOULDER MINI OPEN ROTATOR CUFF REPAIR AND DISTAL CLAVICAL RESECTION (Left)  SURGEON:  Surgeon(s) and Role:    * Susa Day, MD - Primary  PHYSICIAN ASSISTANT:   ASSISTANTS: [place]   ANESTHESIA:   spinal  EBL:  Total I/O In: 1000 [I.V.:1000] Out: -   BLOOD ADMINISTERED:none  DRAINS: none   LOCAL MEDICATIONS USED:  MARCAINE     SPECIMEN:  No Specimen  DISPOSITION OF SPECIMEN:  N/A  COUNTS:  YES  TOURNIQUET:  * No tourniquets in log *  DICTATION: .Other Dictation: Dictation Number [id number]  PLAN OF CARE: Admit for overnight observation  PATIENT DISPOSITION:  PACU - hemodynamically stable.   Delay start of Pharmacological VTE agent (>24hrs) due to surgical blood loss or risk of bleeding: no

## 2015-11-10 NOTE — Transfer of Care (Signed)
Immediate Anesthesia Transfer of Care Note  Patient: Jenny Glass  Procedure(s) Performed: Procedure(s): LEFT SHOULDER MINI OPEN ROTATOR CUFF REPAIR AND DISTAL CLAVICAL RESECTION (Left)  Patient Location: PACU  Anesthesia Type:General and Regional  Level of Consciousness: Patient easily awoken, sedated, comfortable, cooperative, following commands, responds to stimulation.   Airway & Oxygen Therapy: Patient spontaneously breathing, ventilating well, oxygen via simple oxygen mask.  Post-op Assessment: Report given to PACU RN, vital signs reviewed and stable, moving all extremities.   Post vital signs: Reviewed and stable.  Complications: No apparent anesthesia complications

## 2015-11-10 NOTE — Interval H&P Note (Signed)
History and Physical Interval Note:  11/10/2015 7:21 AM  Jenny Glass  has presented today for surgery, with the diagnosis of LEFT ROTATOR CUFF TEAR AND AC ARTHROSIS  The various methods of treatment have been discussed with the patient and family. After consideration of risks, benefits and other options for treatment, the patient has consented to  Procedure(s): LEFT SHOULDER MINI OPEN ROTATOR CUFF REPAIR AND DISTAL CLAVICAL RESECTION (Left) as a surgical intervention .  The patient's history has been reviewed, patient examined, no change in status, stable for surgery.  I have reviewed the patient's chart and labs.  Questions were answered to the patient's satisfaction.     Lee Kuang C

## 2015-11-10 NOTE — Anesthesia Procedure Notes (Addendum)
Anesthesia Regional Block:  Interscalene brachial plexus block  Pre-Anesthetic Checklist: ,, timeout performed, Correct Patient, Correct Site, Correct Laterality, Correct Procedure, Correct Position, site marked, Risks and benefits discussed,  Surgical consent,  Pre-op evaluation,  At surgeon's request and post-op pain management  Laterality: Upper and Left  Prep: chloraprep       Needles:  Injection technique: Single-shot  Needle Type: Echogenic Stimulator Needle          Additional Needles:  Procedures: ultrasound guided (picture in chart) Interscalene brachial plexus block Narrative:  Injection made incrementally with aspirations every 5 mL.  Performed by: Personally  Anesthesiologist: Oleta Mouse  Additional Notes: H+P and labs reviewed, risks and benefits discussed with patient, procedure tolerated well without complications   Procedure Name: Intubation Date/Time: 11/10/2015 9:46 AM Performed by: Deliah Boston Pre-anesthesia Checklist: Patient identified, Emergency Drugs available, Suction available and Patient being monitored Patient Re-evaluated:Patient Re-evaluated prior to inductionOxygen Delivery Method: Circle System Utilized Preoxygenation: Pre-oxygenation with 100% oxygen Intubation Type: IV induction Ventilation: Mask ventilation without difficulty Laryngoscope Size: Mac and 3 Grade View: Grade I Tube type: Oral Tube size: 7.0 mm Number of attempts: 1 Airway Equipment and Method: Oral airway Placement Confirmation: ETT inserted through vocal cords under direct vision,  positive ETCO2 and breath sounds checked- equal and bilateral Secured at: 21 cm Tube secured with: Tape Dental Injury: Teeth and Oropharynx as per pre-operative assessment

## 2015-11-10 NOTE — Discharge Instructions (Signed)
Aquacel dressing may remain in place until follow up. May shower with aquacel dressing in place. If the dressing becomes saturated or peels off, you may remove it and place a new dressing with gauze and tape which should be kept clean and dry and changed daily. °Use sling at times except when exercising or showering °No driving for 4-6 weeks °No lifting for 6 weeks operative arm °Pendulum exercises as instructed. °Ok to move wrist, elbow, and hand. °See Dr. Herberto Ledwell in 10-14 days. Take one aspirin per day with a meal if not on a blood thinner or allergic to aspirin. °

## 2015-11-10 NOTE — Anesthesia Postprocedure Evaluation (Signed)
Anesthesia Post Note  Patient: Jenny Glass  Procedure(s) Performed: Procedure(s) (LRB): LEFT SHOULDER MINI OPEN ROTATOR CUFF REPAIR AND DISTAL CLAVICAL RESECTION (Left)  Patient location during evaluation: PACU Anesthesia Type: General and Regional Level of consciousness: awake Pain management: pain level controlled Vital Signs Assessment: post-procedure vital signs reviewed and stable Respiratory status: spontaneous breathing Cardiovascular status: stable Postop Assessment: no signs of nausea or vomiting Anesthetic complications: no    Last Vitals:  Filed Vitals:   11/10/15 1526 11/10/15 1610  BP: 94/59 107/69  Pulse: 103 94  Temp: 36.7 C 36.9 C  Resp: 16 16    Last Pain:  Filed Vitals:   11/10/15 1628  PainSc: 7                  Callahan Wild

## 2015-11-10 NOTE — Anesthesia Preprocedure Evaluation (Signed)
Anesthesia Evaluation  Patient identified by MRN, date of birth, ID band Patient awake    Reviewed: Allergy & Precautions, NPO status , Patient's Chart, lab work & pertinent test results  History of Anesthesia Complications Negative for: history of anesthetic complications  Airway Mallampati: II  TM Distance: >3 FB Neck ROM: Full    Dental  (+) Teeth Intact   Pulmonary neg pulmonary ROS,    breath sounds clear to auscultation       Cardiovascular negative cardio ROS   Rhythm:Regular     Neuro/Psych negative neurological ROS  negative psych ROS   GI/Hepatic negative GI ROS, Neg liver ROS,   Endo/Other  negative endocrine ROS  Renal/GU negative Renal ROS     Musculoskeletal  (+) Arthritis ,   Abdominal   Peds  Hematology negative hematology ROS (+)   Anesthesia Other Findings   Reproductive/Obstetrics                             Anesthesia Physical Anesthesia Plan  ASA: II  Anesthesia Plan: General and Regional   Post-op Pain Management:    Induction: Intravenous  Airway Management Planned: Oral ETT  Additional Equipment: None  Intra-op Plan:   Post-operative Plan: Extubation in OR  Informed Consent: I have reviewed the patients History and Physical, chart, labs and discussed the procedure including the risks, benefits and alternatives for the proposed anesthesia with the patient or authorized representative who has indicated his/her understanding and acceptance.   Dental advisory given  Plan Discussed with: CRNA and Surgeon  Anesthesia Plan Comments:         Anesthesia Quick Evaluation

## 2015-11-11 ENCOUNTER — Encounter (HOSPITAL_COMMUNITY): Payer: Self-pay | Admitting: Specialist

## 2015-11-11 DIAGNOSIS — M75122 Complete rotator cuff tear or rupture of left shoulder, not specified as traumatic: Secondary | ICD-10-CM | POA: Diagnosis not present

## 2015-11-11 LAB — BASIC METABOLIC PANEL
Anion gap: 10 (ref 5–15)
BUN: 11 mg/dL (ref 6–20)
CO2: 26 mmol/L (ref 22–32)
Calcium: 8.9 mg/dL (ref 8.9–10.3)
Chloride: 105 mmol/L (ref 101–111)
Creatinine, Ser: 0.82 mg/dL (ref 0.44–1.00)
GFR calc Af Amer: >60 mL/min (ref >60)
GFR calc non Af Amer: >60 mL/min (ref >60)
Glucose, Bld: 207 mg/dL — ABNORMAL HIGH (ref 65–99)
Potassium: 4.6 mmol/L (ref 3.5–5.1)
Sodium: 141 mmol/L (ref 135–145)

## 2015-11-11 NOTE — Progress Notes (Signed)
Subjective: 1 Day Post-Op Procedure(s) (LRB): LEFT SHOULDER MINI OPEN ROTATOR CUFF REPAIR AND DISTAL CLAVICAL RESECTION (Left) Patient reports pain as mild.  Doing well and feels ready to go home. Block worn off.  Objective: Vital signs in last 24 hours: Temp:  [98 F (36.7 C)-99 F (37.2 C)] 98.6 F (37 C) (01/06 1000) Pulse Rate:  [66-103] 66 (01/06 1000) Resp:  [16-20] 18 (01/06 1000) BP: (94-126)/(57-69) 102/59 mmHg (01/06 1000) SpO2:  [91 %-94 %] 93 % (01/06 1000)  Intake/Output from previous day: 01/05 0701 - 01/06 0700 In: 3590 [P.O.:1060; I.V.:2430; IV Piggyback:100] Out: 1550 [Urine:1550] Intake/Output this shift: Total I/O In: 120 [P.O.:120] Out: 0    Recent Labs  11/08/15 1400  HGB 10.3*    Recent Labs  11/08/15 1400  WBC 8.9  RBC 3.86*  HCT 33.2*  PLT 368    Recent Labs  11/08/15 1400 11/11/15 0440  NA 144 141  K 3.9 4.6  CL 106 105  CO2 26 26  BUN 11 11  CREATININE 0.76 0.82  GLUCOSE 93 207*  CALCIUM 9.3 8.9   No results for input(s): LABPT, INR in the last 72 hours.  Neurologically intact ABD soft Neurovascular intact Sensation intact distally Intact pulses distally Dorsiflexion/Plantar flexion intact Incision: dressing C/D/I and no drainage No cellulitis present Compartment soft no sign of DVT  Assessment/Plan: 1 Day Post-Op Procedure(s) (LRB): LEFT SHOULDER MINI OPEN ROTATOR CUFF REPAIR AND DISTAL CLAVICAL RESECTION (Left) Advance diet Up with therapy D/C IV fluids  D/C home Discussed D/C instructions  Jenny Glass M. 11/11/2015, 1:25 PM

## 2015-11-11 NOTE — Op Note (Signed)
NAMEDELAYNI, KIENITZ NO.:  [id number]  MEDICAL RECORD NO.:  [id number]  LOCATION:  [facility]                         FACILITY:  [facility]  PHYSICIAN:  Susa Day, M.D.    DATE OF BIRTH:  1951-10-05  DATE OF PROCEDURE:  11/10/2015 DATE OF DISCHARGE:                              OPERATIVE REPORT   PREOPERATIVE DIAGNOSIS:  Acromioclavicular arthrosis, rotator cuff tear completely retracted, left shoulder.  POSTOPERATIVE DIAGNOSIS:  Acromioclavicular arthrosis, rotator cuff tear completely retracted, left shoulder.  PROCEDURE PERFORMED: 1. Mini open rotator cuff repair, left shoulder, utilizing the     SwiveLock suture anchors. 2. Mini open distal clavicle resection through separate subfascial     incision.  ANESTHESIA:  General.  ASSISTANT:  Cleophas Dunker, PA.  HISTORY:  A 65 year old with full-thickness rotator cuff tear retracted, severe AC arthrosis, indicated for repair of the rotator cuff and distal clavicle resection.  Risks and benefits were discussed including bleeding, infection, suboptimal range of motion, prolonged recovery, DVT, PE, anesthetic complications, etc.  TECHNIQUE:  The patient in supine beach-chair position, after induction of adequate general anesthesia, 2 g Kefzol, left shoulder was prepped and draped in usual sterile fashion and the upper extremity.  She had good range of motion under anesthesia.  Surgical marker utilized to delineate the acromion AC joint and coracoid.  A 2-cm incision over the anterior lateral aspect of the acromion was made through the skin. Subcutaneous tissue was dissected.  Electrocautery was utilized to achieve hemostasis.  Raphe between the anterolateral heads were identified, divided in line with the skin incision.  We preserved the deltoid attachment.  Released the CA ligament.  Removed the spur off the anterior lateral aspect of the acromion with 3 mm Kerrison. Hypertrophic synovitis and bursa was  noted.  This was carefully excised. A full-thickness tear of the supraspinatus and infraspinatus was noted and retracted approximately 1.5 cm.  We mobilized the tendons on the bursal and articular surface, and advanced them without difficulty to the lateral aspect of the greater tuberosity.  We then fashioned the trough and the bone with Beyer rongeur to good bleeding tissue.  Placed one SwiveLock suture anchor bisecting the supraspinatus and infraspinatus.  We used a FiberTape suture thread and with the scorpion threaded 2 suture leads to the supraspinatus from the infraspinatus and advanced them anterolaterally and then secured them to the trough without undue tension.  They were secured over the lateral aspect of the greater tuberosity and second row configuration with 2 separate SwiveLocks after utilization of an awl, insertion of a SwiveLock as previous with excellent resistance to pull out.  These were countersunk without undue tension.  This fully advanced and covered the open tear and the remainder was unremarkable, so we did reinforce the repair with 0 Vicryl interrupted figure-of-eight sutures through the supraspinatus and infraspinatus.  I digitally lysed adhesions in the subacromial space.  Remainder of the cuff was unremarkable.  Copiously irrigated the wound and closed the raphe with 1 Vicryl, subcu with 2-0, and skin with subcuticular Prolene.  Separate incision 1.5 cm was made over the distal clavicle from anterior to posterior.  I divided the deltotrapezial fascia, identified  the capsule and divided the capsule in line with that.  We skeletonized the distal clavicle with an AO elevator and debrided the AC joint.  There was severe arthritis noted.  This was removed with the [facility] rongeur and then we undercut the distal clavicle and removed approximately 0.5 cm of the distal clavicle with 3 mm Kerrison and removed in an inferior projected spur off the acromion and off the  clavicle preserving the coracoacromial ligaments.  This decompressed the [facility] joint.  Copiously irrigated the wound.  No active bleeding.  Closed the capsule with 0 Vicryl, subcu and deltotrapezial fascia with 2-0, and skin with Prolene.  Sterile dressings were applied. Placed in a sling, extubated without difficulty, and transported to the recovery room in satisfactory condition.  The patient tolerated the procedure well.  No complications.  Assistant, Cleophas Dunker, [place].  Minimal blood loss.  She also had a preoperative interscalene block.     Susa Day, M.D.     Geralynn Rile  D:  11/10/2015  T:  11/10/2015  Job:  [id number]

## 2015-11-11 NOTE — Evaluation (Signed)
Occupational Therapy Evaluation Patient Details Name: JUDEA PALLESCHI MRN: [id number] DOB: [birth year] Today's Date: 11/11/2015    History of Present Illness Pt is s/p mini open RCR on L side and SAD. She has PMH significant for cervical stenosis and LBP   Clinical Impression   This 65 year old female was admitted for the above surgery.  All education was completed. She will follow up with Dr Tonita Cong for further rehab    Follow Up Recommendations  Supervision/Assistance - 24 hour (pt will follow up with dr Tonita Cong)    Equipment Recommendations  None recommended by OT    Recommendations for Other Services       Precautions / Restrictions Precautions Precautions: Shoulder Type of Shoulder Precautions: sling at all times; pendulums OK Restrictions Weight Bearing Restrictions: Yes Other Position/Activity Restrictions: NWB      Mobility Bed Mobility Overal bed mobility: Modified Independent (HOB raised)                Transfers Overall transfer level: Modified independent                    Balance                                            ADL Overall ADL's : Needs assistance/impaired     Grooming: Supervision/safety;Sitting   Upper Body Bathing: Moderate assistance;Sitting   Lower Body Bathing: Moderate assistance;Sit to/from stand   Upper Body Dressing : Maximal assistance;Sitting   Lower Body Dressing: Maximal assistance;Sit to/from stand   Toilet Transfer: Min guard;Stand-pivot (to recliner)   Toileting- Water quality scientist and Hygiene: Minimal assistance;Sit to/from stand         General ADL Comments: performed ADL, educated about shower safety:  Husband next to her when she is ready and washing feet on commode. Pt is L dominant and needed cues not to use arm during ADL.  Completed shoulder protocol education and pt/husband verbalize understanding.  See education section of chart.  Husband cued to don sling slowly--practiced  twice and did well second time.  Pt can stabilize her wrist with other hand when he performs this task.  Pt with LBP--may not be able to tolerate pendulums.  instructed that it is OK if she cannot do them--do not want to aggravate back.  Pt performed with very little forward leaning     Vision     Perception     Praxis      Pertinent Vitals/Pain Pain Assessment: Faces Faces Pain Scale: Hurts even more Pain Location: left shoulder Pain Descriptors / Indicators: Aching Pain Intervention(s): Limited activity within patient's tolerance;Monitored during session;Premedicated before session;Repositioned;Ice applied     Hand Dominance Left   Extremity/Trunk Assessment Upper Extremity Assessment Upper Extremity Assessment: LUE deficits/detail LUE Deficits / Details: immobilized; elbow to fingers [facility]           Communication Communication Communication: No difficulties   Cognition Arousal/Alertness: Awake/alert Behavior During Therapy: WFL for tasks assessed/performed Overall Cognitive Status: Within Functional Limits for tasks assessed: pt did need cues not to move LUE during ADL                     General Comments       Exercises       Shoulder Instructions      Home Living Family/patient expects to be discharged  to:: Private residence Living Arrangements: Spouse/significant other Available Help at Discharge: Family Type of Home: House             Bathroom Shower/Tub: Tub/shower unit Shower/tub characteristics: Curtain Biochemist, clinical: Handicapped height     Home Equipment: Grab bars - tub/shower          Prior Functioning/Environment Level of Independence: Independent             OT Diagnosis: Acute pain   OT Problem List: Impaired UE functional use;Pain;Decreased knowledge of precautions   OT Treatment/Interventions:      OT Goals(Current goals can be found in the care plan section) Acute Rehab OT Goals Patient Stated Goal: get better  so she can help husband when he has hip sx  OT Frequency:     Barriers to D/C:            Co-evaluation              End of Session Nurse Communication:  (OT complete)  Activity Tolerance: Patient tolerated treatment well Patient left: in chair;with call bell/phone within reach;with family/visitor present   Time: [id number] OT Time Calculation (min): 49 min Charges:  OT General Charges $OT Visit: 1 Procedure OT Evaluation $OT Eval Low Complexity: 1 Procedure OT Treatments $Self Care/Home Management : 8-22 mins $Therapeutic Exercise: 8-22 mins G-Codes: OT G-codes **NOT FOR INPATIENT CLASS** Functional Assessment Tool Used: clinical observation and judgment Functional Limitation: Self care Self Care Current Status [id number]): At least 60 percent but less than 80 percent impaired, limited or restricted Self Care Goal Status [id number]): At least 60 percent but less than 80 percent impaired, limited or restricted Self Care Discharge Status [phone]): At least 60 percent but less than 80 percent impaired, limited or restricted  Ryheem Jay 11/11/2015, 10:05 AM Lesle Chris, OTR/L [phone] 11/11/2015

## 2015-11-11 NOTE — Addendum Note (Signed)
Addendum  created 11/11/15 0957 by Lollie Sails, CRNA   Modules edited: Charges VN

## 2015-11-11 NOTE — Care Management Note (Signed)
Case Management Note  Patient Details  Name: Jenny Glass MRN: [id number] Date of Birth: 1951/04/28  Subjective/Objective: 66 y/o f admitted w/L rotator cuff tear, s/p repair. From home.                   Action/Plan:d/c home.   Expected Discharge Date:                  Expected Discharge Plan:  Home/Self Care  In-House Referral:     Discharge planning Services  CM Consult  Post Acute Care Choice:    Choice offered to:     DME Arranged:    DME Agency:     HH Arranged:    HH Agency:     Status of Service:  In process, will continue to follow  Medicare Important Message Given:    Date Medicare IM Given:    Medicare IM give by:    Date Additional Medicare IM Given:    Additional Medicare Important Message give by:     If discussed at [place] of Stay Meetings, dates discussed:    Additional Comments:  Dessa Phi, RN 11/11/2015, 10:35 AM

## 2015-11-11 NOTE — Progress Notes (Signed)
PT Cancellation Note  Patient Details Name: Jenny Glass MRN: [id number] DOB: 10/26/1951   Cancelled Treatment:     PT order received but eval deferred.  OT screened pt with no PT needs identified.  Will dc PT service at this time.   Rusty Glodowski 11/11/2015, 11:07 AM

## 2015-11-11 NOTE — Discharge Summary (Signed)
Patient ID: Jenny Glass MRN: [id number] DOB/AGE: 03-22-1951 65 y.o.  Admit date: 11/10/2015 Discharge date: 11/11/2015  Admission Diagnoses:  Active Problems:   Rotator cuff tear   Rupture of rotator cuff of shoulder   Discharge Diagnoses:  Same  Past Medical History  Diagnosis Date  . Chronic back pain   . UTI (lower urinary tract infection)   . Diverticulitis   . Numbness and tingling     hands bilat and lower legs bilat comes and goes     Surgeries: Procedure(s): LEFT SHOULDER MINI OPEN ROTATOR CUFF REPAIR AND DISTAL CLAVICAL RESECTION on 11/10/2015   Consultants:    Discharged Condition: Improved  Hospital Course: Jenny Glass is an 65 y.o. female who was admitted 11/10/2015 for operative treatment of Left shoulder rotator cuff tear, AC arthrosis. Patient has severe unremitting pain that affects sleep, daily activities, and work/hobbies. After pre-op clearance the patient was taken to the operating room on 11/10/2015 and underwent  Procedure(s): LEFT SHOULDER MINI OPEN ROTATOR CUFF REPAIR AND DISTAL CLAVICAL RESECTION.    Patient was given perioperative antibiotics: Anti-infectives    Start     Dose/Rate Route Frequency Ordered Stop   11/10/15 1500  ceFAZolin (ANCEF) IVPB 1 g/50 mL premix     1 g 100 mL/hr over 30 Minutes Intravenous Every 6 hours 11/10/15 1310 11/10/15 2327   11/10/15 1115  polymyxin B 500,000 Units, bacitracin 50,000 Units in sodium chloride irrigation 0.9 % 500 mL irrigation  Status:  Discontinued       As needed 11/10/15 1116 11/10/15 1132   11/10/15 0700  ceFAZolin (ANCEF) IVPB 2 g/50 mL premix     2 g 100 mL/hr over 30 Minutes Intravenous On call to O.R. 11/10/15 [id number] 11/10/15 0949       Patient was given sequential compression devices, early ambulation, and chemoprophylaxis to prevent DVT.  Patient benefited maximally from hospital stay and there were no complications.    Recent vital signs: Patient Vitals for the past 24 hrs:  BP Temp Temp  src Pulse Resp SpO2  11/11/15 1000 (!) 102/59 mmHg 98.6 F (37 C) Oral 66 18 93 %  11/11/15 0455 126/69 mmHg 98.5 F (36.9 C) Oral 91 20 91 %  11/11/15 0045 (!) 109/58 mmHg 98.9 F (37.2 C) Oral 92 20 92 %  11/10/15 2221 97/68 mmHg 99 F (37.2 C) Oral 96 18 93 %  11/10/15 1850 (!) 101/57 mmHg 98.6 F (37 C) Oral 95 18 92 %  11/10/15 1610 107/69 mmHg 98.5 F (36.9 C) Oral 94 16 94 %  11/10/15 1526 (!) 94/59 mmHg 98 F (36.7 C) Oral (!) 103 16 93 %  11/10/15 1352 98/60 mmHg 98.5 F (36.9 C) Oral (!) 101 - 93 %     Recent laboratory studies:  Recent Labs  11/08/15 1400 11/11/15 0440  WBC 8.9  --   HGB 10.3*  --   HCT 33.2*  --   PLT 368  --   NA 144 141  K 3.9 4.6  CL 106 105  CO2 26 26  BUN 11 11  CREATININE 0.76 0.82  GLUCOSE 93 207*  CALCIUM 9.3 8.9     Discharge Medications:     Medication List    STOP taking these medications        HYDROcodone-acetaminophen 5-325 MG tablet  Commonly known as:  NORCO/VICODIN      TAKE these medications        CALCIUM + D3 PO  Take 1 tablet by mouth daily.     docusate sodium 100 MG capsule  Commonly known as:  COLACE  Take 1 capsule (100 mg total) by mouth 2 (two) times daily as needed for mild constipation.     gabapentin 300 MG capsule  Commonly known as:  NEURONTIN  Take 300 mg by mouth 3 (three) times daily.     ibuprofen 200 MG tablet  Commonly known as:  ADVIL,MOTRIN  Take 800 mg by mouth 2 (two) times daily as needed for mild pain or moderate pain.     methocarbamol 500 MG tablet  Commonly known as:  ROBAXIN  Take 1 tablet (500 mg total) by mouth every 8 (eight) hours as needed for muscle spasms.     oxyCODONE-acetaminophen 5-325 MG tablet  Commonly known as:  PERCOCET  Take 1-2 tablets by mouth every 4 (four) hours as needed.        Diagnostic Studies: No results found.  Disposition: 01-Home or Self Care      Discharge Instructions    Call MD / Call 911    Complete by:  As directed   If  you experience chest pain or shortness of breath, CALL 911 and be transported to the hospital emergency room.  If you develope a fever above 101 F, pus (white drainage) or increased drainage or redness at the wound, or calf pain, call your surgeon's office.     Constipation Prevention    Complete by:  As directed   Drink plenty of fluids.  Prune juice may be helpful.  You may use a stool softener, such as Colace (over the counter) 100 mg twice a day.  Use MiraLax (over the counter) for constipation as needed.     Diet - low sodium heart healthy    Complete by:  As directed      Increase activity slowly as tolerated    Complete by:  As directed            Follow-up Information    Follow up with BEANE,JEFFREY C, MD In 2 weeks.   Specialty:  Orthopedic Surgery   Why:  For suture removal   Contact information:   [address] [place] [address] [phone]        Signed: Cecilie Kicks. 11/11/2015, 1:26 PM

## 2015-11-18 ENCOUNTER — Telehealth: Payer: Self-pay | Admitting: Family Medicine

## 2015-11-18 NOTE — Telephone Encounter (Incomplete)
SPOKE WITH PATIENTS HUSBAND.  HE STATED THAT THE PATIENT HAD A COLONOSCOPY WITH DR HUNG 5 YEARS AGO  CALLED AND GOT THOSE RESULTS.  SHE IS NOT FOND OF MAMMOGRAMS SO SHE WOULD NOT ALLOW ME TO ORDER THIS AT THIS TIME.

## 2015-11-19 ENCOUNTER — Encounter: Payer: Self-pay | Admitting: Family Medicine

## 2015-11-21 ENCOUNTER — Ambulatory Visit (INDEPENDENT_AMBULATORY_CARE_PROVIDER_SITE_OTHER): Payer: BLUE CROSS/BLUE SHIELD | Admitting: Family Medicine

## 2015-11-21 ENCOUNTER — Encounter: Payer: Self-pay | Admitting: Family Medicine

## 2015-11-21 VITALS — BP 135/80 | HR 88 | Temp 98.6°F | Resp 16

## 2015-11-21 DIAGNOSIS — Z23 Encounter for immunization: Secondary | ICD-10-CM

## 2015-11-21 DIAGNOSIS — Z1239 Encounter for other screening for malignant neoplasm of breast: Secondary | ICD-10-CM

## 2015-11-21 DIAGNOSIS — M5442 Lumbago with sciatica, left side: Secondary | ICD-10-CM

## 2015-11-21 MED ORDER — ZOSTER VACCINE LIVE 19400 UNT/0.65ML ~~LOC~~ SOLR: 0.6500 mL | Freq: Once | SUBCUTANEOUS | Status: DC

## 2015-11-21 NOTE — Patient Instructions (Addendum)
Follow up as planned with your orthopaedist this week.  Discuss your back pain and next step with him, but let me know if I can help or if referral is needed.  Return to the clinic or go to the nearest emergency room if any of your symptoms worsen or new symptoms occur.  Shingles vaccine printed - this can be filled and given at your pharmacy. See info below.   We will schedule the mammogram as discussed, but follow up in the next 6 months for a physical.   Return to the clinic or go to the nearest emergency room if any of your symptoms worsen or new symptoms occur.   Shingles Vaccine: What You Need to Know WHAT IS SHINGLES?  Shingles is a painful skin rash, often with blisters. It is also called Herpes Zoster or just Zoster.  A shingles rash usually appears on one side of the face or body and lasts from 2 to 4 weeks. Its main symptom is pain, which can be quite severe. Other symptoms of shingles can include fever, headache, chills, and upset stomach. Very rarely, a shingles infection can lead to pneumonia, hearing problems, blindness, brain inflammation (encephalitis), or death.  For about 1 person in 5, severe pain can continue even after the rash clears up. This is called post-herpetic neuralgia.  Shingles is caused by the Varicella Zoster virus. This is the same virus that causes chickenpox. Only someone who has had a case of chickenpox or rarely, has gotten chickenpox vaccine, can get shingles. The virus stays in your body. It can reappear many years later to cause a case of shingles.  You cannot catch shingles from another person with shingles. However, a person who has never had chickenpox (or chickenpox vaccine) could get chickenpox from someone with shingles. This is not very common.  Shingles is far more common in people 65 and older than in younger people. It is also more common in people whose immune systems are weakened because of a disease such as cancer or drugs such as steroids  or chemotherapy.  At least 1 million people get shingles per year in the [place]. SHINGLES VACCINE  A vaccine for shingles was licensed in 123456. In clinical trials, the vaccine reduced the risk of shingles by 50%. It can also reduce the pain in people who still get shingles after being vaccinated.  A single dose of shingles vaccine is recommended for adults 45 years of age and older. SOME PEOPLE SHOULD NOT GET SHINGLES VACCINE OR SHOULD WAIT A person should not get shingles vaccine if he or she:  Has ever had a life-threatening allergic reaction to gelatin, the antibiotic neomycin, or any other component of shingles vaccine. Tell your caregiver if you have any severe allergies.  Has a weakened immune system because of current:  AIDS or another disease that affects the immune system.  Treatment with drugs that affect the immune system, such as prolonged use of high-dose steroids.  Cancer treatment, such as radiation or chemotherapy.  Cancer affecting the bone marrow or lymphatic system, such as leukemia or lymphoma.  Is pregnant, or might be pregnant. Women should not become pregnant until at least 4 weeks after getting shingles vaccine. Someone with a minor illness, such as a cold, may be vaccinated. Anyone with a moderate or severe acute illness should usually wait until he or she recovers before getting the vaccine. This includes anyone with a temperature of 101.3 F (38 C) or higher. WHAT ARE THE  RISKS FROM SHINGLES VACCINE?  A vaccine, like any medicine, could possibly cause serious problems, such as severe allergic reactions. However, the risk of a vaccine causing serious harm, or death, is extremely small.  No serious problems have been identified with shingles vaccine. Mild Problems  Redness, soreness, swelling, or itching at the site of the injection (about 1 person in 3).  Headache (about 1 person in 31). Like all vaccines, shingles vaccine is being closely  monitored for unusual or severe problems. WHAT IF THERE IS A MODERATE OR SEVERE REACTION? What should I look for? Any unusual condition, such as a severe allergic reaction or a high fever. If a severe allergic reaction occurred, it would be within a few minutes to an hour after the shot. Signs of a serious allergic reaction can include difficulty breathing, weakness, hoarseness or wheezing, a fast heartbeat, hives, dizziness, paleness, or swelling of the throat. What should I do?  Call your caregiver, or get the person to a caregiver right away.  Tell the caregiver what happened, the date and time it happened, and when the vaccination was given.  Ask the caregiver to report the reaction by filing a Vaccine Adverse Event Reporting System (VAERS) form. Or, you can file this report through the VAERS web site at www.vaers.SamedayNews.es or by calling 484-247-5171. VAERS does not provide medical advice. HOW CAN I LEARN MORE?  Ask your caregiver. He or she can give you the vaccine package insert or suggest other sources of information.  Contact the Centers for Disease Control and Prevention (CDC):  Call 604 380 6328 (1-800-CDC-INFO).  Visit the CDC website at http://hunter.com/ CDC Shingles Vaccine VIS (08/10/08)   This information is not intended to replace advice given to you by your health care provider. Make sure you discuss any questions you have with your health care provider.   Document Released: 08/19/2006 Document Revised: 03/08/2015 Document Reviewed: 02/11/2013 Elsevier Interactive Patient Education [employer].

## 2015-11-21 NOTE — Progress Notes (Signed)
Subjective:  By signing my name below, I, Raven Small, attest that this documentation has been prepared under the direction and in the presence of Merri Ray, MD.  Electronically Signed: Thea Alken, ED Scribe. 11/21/2015. 11:09 AM.   Patient ID: Jenny Glass, female    DOB: August 19, 1951, 65 y.o.   MRN: [id number]  HPI   Chief Complaint  Patient presents with  . Establish Care  . Back Pain    started in Sept.  . Leg Pain    bilateral   HPI Comments: Jenny Glass is a 65 y.o. female who presents to the [facility] here to establish care with me.   Last physical 04/2015.  Previous PCP is Eloise Levels at [place]. States she was not the right fit for her.    Health maintenance Colonoscopy: 2013. Had benign polyps. She is to repeat in 5 years.   Declines mammograms. Last mammogram at [facility] breast center a several years ago. Pt plans to schedule this in a few months.  Pap in 04/2015, which was normal.  Hx of rotator cuff tear  Status post repair and distal clavicle resection 11 days ago. Ortho is Dr. Maxie Better.   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 She had CT lumbar spine myelogram 05/2015 with epidural injection 7/28, to L4-5 on the left, under the care Dr. Maxie Better. She also had an epidural injection in 06/09/2013, at that time L5-S1 on the left.   Pt states the epidural received in 2014 seem to last longer than most recent injection. She states pain started to return within 2 weeks after injecton but there was improvement to pain. She has not followed up with Dr. Maxie Better about back pain and has been focusing more on her shoulder.    She has had gradually worsening low back pain radiating into left leg and thigh with left leg weakness for the past 3-4 months.  She reports leg weakness and pain has made it difficult to walk and  change positions. She denies new injury or fall. The oxycodone and gabapentin which she  had been taking for her shoulder has also helped with back pain. She has tried prednisone in the past, which only lasted about 2 days. She denies bladder and bowel incontinence and saddle anesthesia.  Her next appointment with Dr. Maxie Better is in 2 days, but states this appointment is primarily for her shoulder. She has gone for a second opinion by another orthopedist at [facility].   Immunizations Pt has not had shingles vaccine. She will consider having this done.  Flu shot: 07/2015  Patient Active Problem List   Diagnosis Date Noted  . Rotator cuff tear 11/10/2015  . Rupture of rotator cuff of shoulder 11/10/2015   Past Medical History  Diagnosis Date  . Chronic back pain   . UTI (lower urinary tract infection)   . Diverticulitis   . Numbness and tingling     hands bilat and lower legs bilat comes and goes    Past Surgical History  Procedure Laterality Date  . Shoulder open rotator cuff repair Left 11/10/2015    Procedure: LEFT SHOULDER MINI OPEN ROTATOR CUFF REPAIR AND DISTAL CLAVICAL RESECTION;  Surgeon: Susa Day, MD;  Location: [facility];  Service: Orthopedics;  Laterality: Left;   No Known Allergies Prior to Admission medications   Medication Sig Start Date End Date Taking? Authorizing Provider  Calcium Carb-Cholecalciferol (  CALCIUM + D3 PO) Take 1 tablet by mouth daily.   Yes Historical Provider, MD  ibuprofen (ADVIL,MOTRIN) 200 MG tablet Take 800 mg by mouth 2 (two) times daily as needed for mild pain or moderate pain.   Yes Historical Provider, MD  methocarbamol (ROBAXIN) 500 MG tablet Take 1 tablet (500 mg total) by mouth every 8 (eight) hours as needed for muscle spasms. 11/10/15  Yes Susa Day, MD  oxyCODONE-acetaminophen (PERCOCET) 5-325 MG tablet Take 1-2 tablets by mouth every 4 (four) hours as needed. 11/10/15  Yes Susa Day, MD  gabapentin (NEURONTIN) 300 MG capsule Take 300 mg by mouth 3 (three) times daily. Reported on 11/21/2015 09/21/15   Historical Provider,  MD   Social History   Social History  . Marital Status: Married    Spouse Name: N/A  . Number of Children: N/A  . Years of Education: N/A   Occupational History  . Not on file.   Social History Main Topics  . Smoking status: Never Smoker   . Smokeless tobacco: Never Used  . Alcohol Use: No  . Drug Use: No  . Sexual Activity: No   Other Topics Concern  . Not on file   Social History Narrative   Review of Systems  Genitourinary: Negative for enuresis.  Musculoskeletal: Positive for myalgias, back pain and gait problem. Negative for joint swelling.  Neurological: Positive for weakness. Negative for numbness.   Objective:   Physical Exam  Constitutional: She is oriented to person, place, and time. She appears well-developed and well-nourished. No distress.  HENT:  Head: Normocephalic and atraumatic.  Eyes: Conjunctivae and EOM are normal.  Neck: Neck supple.  Cardiovascular: Normal rate.   Pulmonary/Chest: Effort normal.  Musculoskeletal: Normal range of motion.  Diffuse tenderness along mid aspect of lumbar spine. Equal LE strength negative seated straight leg raise. Equal rotation. Equal lateral flexion. Slightly decreased flexion due to brace on left shoulder. Extension is intact. Able to heel to toe walk without difficulty.  Neurological: She is alert and oriented to person, place, and time.  Skin: Skin is warm and dry.  Psychiatric: She has a normal mood and affect. Her behavior is normal.  Nursing note and vitals reviewed.  Filed Vitals:   11/21/15 1039  BP: 135/80  Pulse: 88  Temp: 98.6 F (37 C)  Resp: 16   Assessment & Plan:   Jenny Glass is a 65 y.o. female Need for shingles vaccine - Plan: zoster vaccine live, PF, (ZOSTAVAX) 09811 UNT/0.65ML injection Screening for breast cancer - Plan: MM Digital Screening  - health maintenance discussed as above. Refer for MMG, shingles vaccine rx given to fill.   Left-sided low back pain with left-sided  sciatica  -upcoming appt with ortho  - can decide further workup/eval there, but if referral to back/neuro needed - advised to let me know.    Meds ordered this encounter  Medications  . zoster vaccine live, PF, (ZOSTAVAX) 91478 UNT/0.65ML injection    Sig: Inject 19,400 Units into the skin once.    Dispense:  1 each    Refill:  0   Patient Instructions  Follow up as planned with your orthopaedist this week.  Discuss your back pain and next step with him, but let me know if I can help or if referral is needed.  Return to the clinic or go to the nearest emergency room if any of your symptoms worsen or new symptoms occur.  Shingles vaccine printed - this can be  filled and given at your pharmacy. See info below.   We will schedule the mammogram as discussed, but follow up in the next 6 months for a physical.   Return to the clinic or go to the nearest emergency room if any of your symptoms worsen or new symptoms occur.   Shingles Vaccine: What You Need to Know WHAT IS SHINGLES?  Shingles is a painful skin rash, often with blisters. It is also called Herpes Zoster or just Zoster.  A shingles rash usually appears on one side of the face or body and lasts from 2 to 4 weeks. Its main symptom is pain, which can be quite severe. Other symptoms of shingles can include fever, headache, chills, and upset stomach. Very rarely, a shingles infection can lead to pneumonia, hearing problems, blindness, brain inflammation (encephalitis), or death.  For about 1 person in 5, severe pain can continue even after the rash clears up. This is called post-herpetic neuralgia.  Shingles is caused by the Varicella Zoster virus. This is the same virus that causes chickenpox. Only someone who has had a case of chickenpox or rarely, has gotten chickenpox vaccine, can get shingles. The virus stays in your body. It can reappear many years later to cause a case of shingles.  You cannot catch shingles from another  person with shingles. However, a person who has never had chickenpox (or chickenpox vaccine) could get chickenpox from someone with shingles. This is not very common.  Shingles is far more common in people 44 and older than in younger people. It is also more common in people whose immune systems are weakened because of a disease such as cancer or drugs such as steroids or chemotherapy.  At least 1 million people get shingles per year in the [place]. SHINGLES VACCINE  A vaccine for shingles was licensed in 123456. In clinical trials, the vaccine reduced the risk of shingles by 50%. It can also reduce the pain in people who still get shingles after being vaccinated.  A single dose of shingles vaccine is recommended for adults 70 years of age and older. SOME PEOPLE SHOULD NOT GET SHINGLES VACCINE OR SHOULD WAIT A person should not get shingles vaccine if he or she:  Has ever had a life-threatening allergic reaction to gelatin, the antibiotic neomycin, or any other component of shingles vaccine. Tell your caregiver if you have any severe allergies.  Has a weakened immune system because of current:  AIDS or another disease that affects the immune system.  Treatment with drugs that affect the immune system, such as prolonged use of high-dose steroids.  Cancer treatment, such as radiation or chemotherapy.  Cancer affecting the bone marrow or lymphatic system, such as leukemia or lymphoma.  Is pregnant, or might be pregnant. Women should not become pregnant until at least 4 weeks after getting shingles vaccine. Someone with a minor illness, such as a cold, may be vaccinated. Anyone with a moderate or severe acute illness should usually wait until he or she recovers before getting the vaccine. This includes anyone with a temperature of 101.3 F (38 C) or higher. WHAT ARE THE RISKS FROM SHINGLES VACCINE?  A vaccine, like any medicine, could possibly cause serious problems, such as severe  allergic reactions. However, the risk of a vaccine causing serious harm, or death, is extremely small.  No serious problems have been identified with shingles vaccine. Mild Problems  Redness, soreness, swelling, or itching at the site of the injection (about 1 person  in 3).  Headache (about 1 person in 30). Like all vaccines, shingles vaccine is being closely monitored for unusual or severe problems. WHAT IF THERE IS A MODERATE OR SEVERE REACTION? What should I look for? Any unusual condition, such as a severe allergic reaction or a high fever. If a severe allergic reaction occurred, it would be within a few minutes to an hour after the shot. Signs of a serious allergic reaction can include difficulty breathing, weakness, hoarseness or wheezing, a fast heartbeat, hives, dizziness, paleness, or swelling of the throat. What should I do?  Call your caregiver, or get the person to a caregiver right away.  Tell the caregiver what happened, the date and time it happened, and when the vaccination was given.  Ask the caregiver to report the reaction by filing a Vaccine Adverse Event Reporting System (VAERS) form. Or, you can file this report through the VAERS web site at www.vaers.SamedayNews.es or by calling (249) 650-6032. VAERS does not provide medical advice. HOW CAN I LEARN MORE?  Ask your caregiver. He or she can give you the vaccine package insert or suggest other sources of information.  Contact the Centers for Disease Control and Prevention (CDC):  Call (279)414-6647 (1-800-CDC-INFO).  Visit the CDC website at http://hunter.com/ CDC Shingles Vaccine VIS (08/10/08)   This information is not intended to replace advice given to you by your health care provider. Make sure you discuss any questions you have with your health care provider.   Document Released: 08/19/2006 Document Revised: 03/08/2015 Document Reviewed: 02/11/2013 Elsevier Interactive Patient Education [employer].     I personally performed the services described in this documentation, which was scribed in my presence. The recorded information has been reviewed and considered, and addended by me as needed.

## 2015-11-30 ENCOUNTER — Other Ambulatory Visit: Payer: Self-pay

## 2015-11-30 DIAGNOSIS — Z1231 Encounter for screening mammogram for malignant neoplasm of breast: Secondary | ICD-10-CM

## 2016-02-13 ENCOUNTER — Ambulatory Visit (INDEPENDENT_AMBULATORY_CARE_PROVIDER_SITE_OTHER): Payer: BLUE CROSS/BLUE SHIELD

## 2016-02-13 ENCOUNTER — Ambulatory Visit (INDEPENDENT_AMBULATORY_CARE_PROVIDER_SITE_OTHER): Payer: BLUE CROSS/BLUE SHIELD | Admitting: Family Medicine

## 2016-02-13 VITALS — BP 130/84 | HR 87 | Temp 98.3°F | Resp 18 | Ht 63.0 in | Wt 150.0 lb

## 2016-02-13 DIAGNOSIS — M1711 Unilateral primary osteoarthritis, right knee: Secondary | ICD-10-CM

## 2016-02-13 DIAGNOSIS — M179 Osteoarthritis of knee, unspecified: Secondary | ICD-10-CM

## 2016-02-13 DIAGNOSIS — M25561 Pain in right knee: Secondary | ICD-10-CM

## 2016-02-13 MED ORDER — DICLOFENAC SODIUM 75 MG PO TBEC: 75.0000 mg | DELAYED_RELEASE_TABLET | Freq: Two times a day (BID) | ORAL | Status: DC

## 2016-02-13 NOTE — Patient Instructions (Addendum)
Apply ice several times daily  Take the diclofenac 75 mg one twice daily at breakfast and supper. If it causes stomach upset, discontinue use.  If the medicine does not help substantially over the next 10 days or 2 weeks, then we may need to do a cortisone injection for you  Consider wearing a knee sleeve  Return as needed    IF you received an x-ray today, you will receive an invoice from [facility]. Please contact [facility] at [phone] with questions or concerns regarding your invoice.   IF you received labwork today, you will receive an invoice from [facility]. Please contact [facility] at [phone] with questions or concerns regarding your invoice.   Our billing staff will not be able to assist you with questions regarding bills from these companies.  You will be contacted with the lab results as soon as they are available. The fastest way to get your results is to activate your My Chart account. Instructions are located on the last page of this paperwork. If you have not heard from [place] regarding the results in 2 weeks, please contact this office.

## 2016-02-13 NOTE — Progress Notes (Signed)
Patient ID: KAYLENE SHADDOCK, female    DOB: 1951/08/27  Age: 65 y.o. MRN: [id number]  Chief Complaint  Patient presents with  . Knee Pain    right/ 2 wks, pt fills like there is fluid on her knee    Subjective:   65 year old lady who comes in with a two-week history of right knee pain. Knows of no trauma. It is steadily gotten worse. She feels like she's dragging it. It swells some in the knee joint that is gradually gotten more apparent. She has pain shooting down from the knee down into the right foot. She does not have stairs in her house. Does not do a lot of physical exercise. This just came on for no explained reason. She is 2 months postop from having left rotator cuff repair, and the only medicine she takes now is a muscle relaxant before therapy. She takes occasional ibuprofen.  Current allergies, medications, problem list, past/family and social histories reviewed.  Objective:  BP 130/84 mmHg  Pulse 87  Temp(Src) 98.3 F (36.8 C) (Oral)  Resp 18  Ht 5\' 3"  (1.6 m)  Wt 150 lb (68.04 kg)  BMI 26.58 kg/m2  SpO2 98%  No major acute distress. Walks with a significant limp due to the right leg pain. She has a mild effusion palpable in the knee does look puffy just below the knee and medially.  Assessment & Plan:   Assessment: 1. Right knee pain   2. Osteoarthritis of right knee, unspecified osteoarthritis type       Plan: Right knee pain, etiology undetermined. Will check an x-ray of the knee and proceed from there.  Orders Placed This Encounter  Procedures  . DG Knee Complete 4 Views Right    Order Specific Question:  Reason for Exam (SYMPTOM  OR DIAGNOSIS REQUIRED)    Answer:  idiopathic knee pain and swelling    Order Specific Question:  Preferred imaging location?    Answer:  External    Meds ordered this encounter  Medications  . diclofenac (VOLTAREN) 75 MG EC tablet    Sig: Take 1 tablet (75 mg total) by mouth 2 (two) times daily.    Dispense:  30 tablet   Refill:  0         Patient Instructions   Apply ice several times daily  Take the diclofenac 75 mg one twice daily at breakfast and supper. If it causes stomach upset, discontinue use.  If the medicine does not help substantially over the next 10 days or 2 weeks, then we may need to do a cortisone injection for you  Consider wearing a knee sleeve  Return as needed    IF you received an x-ray today, you will receive an invoice from [facility]. Please contact [facility] at [phone] with questions or concerns regarding your invoice.   IF you received labwork today, you will receive an invoice from [facility]. Please contact [facility] at [phone] with questions or concerns regarding your invoice.   Our billing staff will not be able to assist you with questions regarding bills from these companies.  You will be contacted with the lab results as soon as they are available. The fastest way to get your results is to activate your My Chart account. Instructions are located on the last page of this paperwork. If you have not heard from [place] regarding the results in 2 weeks, please contact this office.  Return if symptoms worsen or fail to improve.   HOPPER,DAVID, MD 02/13/2016

## 2016-04-05 ENCOUNTER — Other Ambulatory Visit: Payer: Self-pay | Admitting: Specialist

## 2016-04-05 DIAGNOSIS — M48061 Spinal stenosis, lumbar region without neurogenic claudication: Secondary | ICD-10-CM

## 2016-04-09 ENCOUNTER — Ambulatory Visit
Admission: RE | Admit: 2016-04-09 | Discharge: 2016-04-09 | Disposition: A | Discharge status: Home / Self Care | Payer: BLUE CROSS/BLUE SHIELD | Source: Ambulatory Visit | Attending: Specialist | Admitting: Specialist

## 2016-04-09 DIAGNOSIS — M48061 Spinal stenosis, lumbar region without neurogenic claudication: Secondary | ICD-10-CM

## 2016-04-09 MED ORDER — METHYLPREDNISOLONE ACETATE 40 MG/ML INJ SUSP (RADIOLOG
120.0000 mg | Freq: Once | INTRAMUSCULAR | Status: AC
  Administered 2016-04-09: 120 mg via EPIDURAL

## 2016-04-09 MED ORDER — IOPAMIDOL (ISOVUE-M 200) INJECTION 41%
1.0000 mL | Freq: Once | INTRAMUSCULAR | Status: AC
  Administered 2016-04-09: 1 mL via EPIDURAL

## 2016-04-09 NOTE — Discharge Instructions (Signed)

## 2016-04-16 ENCOUNTER — Other Ambulatory Visit: Payer: BLUE CROSS/BLUE SHIELD

## 2016-05-23 ENCOUNTER — Ambulatory Visit: Payer: BLUE CROSS/BLUE SHIELD | Admitting: Family Medicine

## 2016-05-24 ENCOUNTER — Encounter: Payer: Self-pay | Admitting: Family Medicine

## 2016-05-24 ENCOUNTER — Ambulatory Visit (INDEPENDENT_AMBULATORY_CARE_PROVIDER_SITE_OTHER): Payer: BLUE CROSS/BLUE SHIELD | Admitting: Family Medicine

## 2016-05-24 VITALS — BP 124/80 | HR 106 | Temp 98.9°F | Resp 18 | Ht 63.0 in

## 2016-05-24 DIAGNOSIS — M791 Myalgia, unspecified site: Secondary | ICD-10-CM

## 2016-05-24 DIAGNOSIS — G8929 Other chronic pain: Secondary | ICD-10-CM

## 2016-05-24 DIAGNOSIS — M545 Low back pain: Secondary | ICD-10-CM | POA: Diagnosis not present

## 2016-05-24 DIAGNOSIS — R531 Weakness: Secondary | ICD-10-CM

## 2016-05-24 NOTE — Patient Instructions (Addendum)
  I would like to review the notes from your orthopedist and Dr. Nelva Bush and discuss further workup with them. We will let you know if neurology or other evaluation is needed.  I did not change in your medications today, as this can be discussed with your orthopedist and physical medicine and rehabilitation specialist (Dr. Nelva Bush).    Return to the clinic or go to the nearest emergency room if any of your symptoms worsen or new symptoms occur.    IF you received an x-ray today, you will receive an invoice from [facility]. Please contact [facility] at [phone] with questions or concerns regarding your invoice.   IF you received labwork today, you will receive an invoice from [facility]. Please contact [facility] at [phone] with questions or concerns regarding your invoice.   Our billing staff will not be able to assist you with questions regarding bills from these companies.  You will be contacted with the lab results as soon as they are available. The fastest way to get your results is to activate your My Chart account. Instructions are located on the last page of this paperwork. If you have not heard from [place] regarding the results in 2 weeks, please contact this office.

## 2016-05-24 NOTE — Progress Notes (Signed)
By signing my name below, I, Mesha Guinyard, attest that this documentation has been prepared under the direction and in the presence of Merri Ray, MD.  Electronically Signed: Verlee Monte, Medical Scribe. 05/24/2016. 12:03 PM.  Subjective:    Patient ID: Jenny Glass, female    DOB: 03/22/1951, 65 y.o.   MRN: [id number]  HPI Chief Complaint  Patient presents with  . Follow-up    6 MONTH/PAIN ALL OVER    HPI Comments: Jenny Glass is a 65 y.o. female who presents to the [facility] for follow-up of pain. She has a hx of chronic back pain. Left shoulder rotator cuff repair January 2017. 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 Tonita Cong. Epidural injection in last July, and in 2014 that seemed to work better. When seen in January she had been having increasing low back pain that had been going into the leg, she hs been having episodic weakness. She was advised to follow up with ortho to see if neurology or ortho surgery was needed. She has been seen by Dr. Linna Darner for right knee pain- treated with Diclofenac.   Pt is wheel pair bound due to weakness in her arms and legs.Pt reports warmth feeling to lower legs, no rash or erythema reported. Pt was given Prednisone because she has been having trouble moving around due to weakness. She reports she was good for 5 days When taking prednisone. When pt is not taking Prednisone, she gets weakness in both arms and legs and can't lift anything- this has been occuring for 1+ years. Pt reports she can't go to the bathroom by her self, and lately she's been experiencing bowel incontinence- pt's husband thinks it because she can't get to the bathroom in time. Pt hasn't seen a neurologist for her symptoms. Pt had a epidural steroid injection June 5th onto the left L5-S1. Pt thinks she had a MRI, but it was not found in her chart. Pt states she has been having back  problems for years. Pt is taking Gabapentin, Robaxin and Oxycontin 10 mg TID for pain management- Dr. Governor Rooks. Pt denies saddles anesthesia. Denies any acute changes of symptoms.  Patient Active Problem List   Diagnosis Date Noted  . Rotator cuff tear 11/10/2015  . Rupture of rotator cuff of shoulder 11/10/2015   Past Medical History  Diagnosis Date  . Chronic back pain   . UTI (lower urinary tract infection)   . Diverticulitis   . Numbness and tingling     hands bilat and lower legs bilat comes and goes    Past Surgical History  Procedure Laterality Date  . Shoulder open rotator cuff repair Left 11/10/2015    Procedure: LEFT SHOULDER MINI OPEN ROTATOR CUFF REPAIR AND DISTAL CLAVICAL RESECTION;  Surgeon: Susa Day, MD;  Location: [facility];  Service: Orthopedics;  Laterality: Left;   No Known Allergies Prior to Admission medications   Medication Sig Start Date End Date Taking? Authorizing Provider  gabapentin (NEURONTIN) 300 MG capsule Take 300 mg by mouth 3 (three) times daily. Reported on 02/13/2016 09/21/15  Yes Historical Provider, MD  methocarbamol (ROBAXIN) 500 MG tablet Take 1 tablet (500 mg total) by mouth every 8 (eight) hours as needed for muscle spasms. 11/10/15  Yes Susa Day, MD  oxyCODONE-acetaminophen (PERCOCET) 10-325 MG tablet Take 1 tablet by mouth every 4 (four) hours as needed for pain.  Yes Historical Provider, MD  zoster vaccine live, PF, (ZOSTAVAX) 09811 UNT/0.65ML injection Inject 19,400 Units into the skin once. 11/21/15  Yes Wendie Agreste, MD  Calcium Carb-Cholecalciferol (CALCIUM + D3 PO) Take 1 tablet by mouth daily. Reported on 05/24/2016    Historical Provider, MD  diclofenac (VOLTAREN) 75 MG EC tablet Take 1 tablet (75 mg total) by mouth 2 (two) times daily. Patient not taking: Reported on 05/24/2016 02/13/16   Posey Boyer, MD  ibuprofen (ADVIL,MOTRIN) 200 MG tablet Take 800 mg by mouth 2 (two) times daily as needed for mild pain or moderate pain.  Reported on 05/24/2016    Historical Provider, MD  oxyCODONE-acetaminophen (PERCOCET) 5-325 MG tablet Take 1-2 tablets by mouth every 4 (four) hours as needed. Patient not taking: Reported on 05/24/2016 11/10/15   Susa Day, MD   Social History   Social History  . Marital Status: Married    Spouse Name: N/A  . Number of Children: N/A  . Years of Education: N/A   Occupational History  . Not on file.   Social History Main Topics  . Smoking status: Never Smoker   . Smokeless tobacco: Never Used  . Alcohol Use: No  . Drug Use: No  . Sexual Activity: No   Other Topics Concern  . Not on file   Social History Narrative   Review of Systems  Skin: Negative for rash.  Neurological: Positive for weakness.   Objective:  BP 124/80 mmHg  Pulse 106  Temp(Src) 98.9 F (37.2 C) (Oral)  Resp 18  Ht 5\' 3"  (1.6 m)  SpO2 98%  Physical Exam  Constitutional: She appears well-developed and well-nourished. No distress.  HENT:  Head: Normocephalic and atraumatic.  Eyes: Conjunctivae are normal.  Neck: Neck supple.  Cardiovascular: Normal rate, regular rhythm and normal heart sounds.  Exam reveals no gallop and no friction rub.   No murmur heard. Pulmonary/Chest: Effort normal and breath sounds normal. No respiratory distress. She has no wheezes. She has no rales.  Musculoskeletal:  Light touch on right foot- pain radiates to knee  Neurological: She is alert. She displays no Babinski's sign on the right side. She displays no Babinski's sign on the left side.  Reflex Scores:      Tricep reflexes are 2+ on the right side and 2+ on the left side.      Bicep reflexes are 2+ on the right side and 2+ on the left side.      Brachioradialis reflexes are 2+ on the right side and 2+ on the left side.      Patellar reflexes are 2+ on the right side and 2+ on the left side.      Achilles reflexes are 2+ on the right side and 2+ on the left side. Skin: Skin is warm and dry. No rash noted. No  erythema.  Psychiatric: She has a normal mood and affect. Her behavior is normal.  Nursing note and vitals reviewed.  Assessment & Plan:   Jenny Glass is a 65 y.o. female Chronic low back pain  Myalgia  Weakness generalized  History of chronic low back pain, with complaints of diffuse myalgia/weakness and improves with prednisone. She has been followed by orthopedics, and physical medicine and rehabilitation through [facility]. Reflexes were intact on exam, and no apparent acute changes to her symptoms. We'll need to determine plan after discussion with orthopedics, but consider neurology evaluation if she has having new proximal or diffuse weakness that may  not be explained by her lumbar spinal disease. ER/RTC precautions given if acute changes in symptoms.  Meds ordered this encounter  Medications  . oxyCODONE-acetaminophen (PERCOCET) 10-325 MG tablet    Sig: Take 1 tablet by mouth every 4 (four) hours as needed for pain.   Patient Instructions    I would like to review the notes from your orthopedist and Dr. Nelva Bush and discuss further workup with them. We will let you know if neurology or other evaluation is needed.  I did not change in your medications today, as this can be discussed with your orthopedist and physical medicine and rehabilitation specialist (Dr. Nelva Bush).    Return to the clinic or go to the nearest emergency room if any of your symptoms worsen or new symptoms occur.    IF you received an x-ray today, you will receive an invoice from [facility]. Please contact [facility] at [phone] with questions or concerns regarding your invoice.   IF you received labwork today, you will receive an invoice from [employer]. Please contact [facility] at [phone] with questions or concerns regarding your invoice.   Our billing staff will not be able to assist you with questions regarding bills from these  companies.  You will be contacted with the lab results as soon as they are available. The fastest way to get your results is to activate your My Chart account. Instructions are located on the last page of this paperwork. If you have not heard from [place] regarding the results in 2 weeks, please contact this office.        I personally performed the services described in this documentation, which was scribed in my presence. The recorded information has been reviewed and considered, and addended by me as needed.   Signed,   Merri Ray, MD [facility].  05/25/2016 2:20 PM

## 2016-05-25 DIAGNOSIS — M545 Low back pain: Principal | ICD-10-CM

## 2016-05-25 DIAGNOSIS — G8929 Other chronic pain: Secondary | ICD-10-CM | POA: Insufficient documentation

## 2016-05-29 ENCOUNTER — Telehealth: Payer: Self-pay | Admitting: Family Medicine

## 2016-05-29 NOTE — Telephone Encounter (Signed)
[facility] orthopedics, Dr. Nelva Bush is currently out of town. Apparently Ms. Maye had a lumbar spinal injection on the 21st. (1 day after our last visit) Message left for Dr. Nelva Bush for when he returns from out of town to discuss her current plan and to determine if other evaluation needed, including if neurologist eval needed.

## 2017-08-23 DIAGNOSIS — J039 Acute tonsillitis, unspecified: Secondary | ICD-10-CM | POA: Diagnosis not present

## 2017-09-12 DIAGNOSIS — E782 Mixed hyperlipidemia: Secondary | ICD-10-CM | POA: Diagnosis not present

## 2017-09-12 DIAGNOSIS — I779 Disorder of arteries and arterioles, unspecified: Secondary | ICD-10-CM | POA: Diagnosis not present

## 2017-09-16 DIAGNOSIS — Z79899 Other long term (current) drug therapy: Secondary | ICD-10-CM | POA: Diagnosis not present

## 2017-09-16 DIAGNOSIS — M0609 Rheumatoid arthritis without rheumatoid factor, multiple sites: Secondary | ICD-10-CM | POA: Diagnosis not present

## 2017-09-19 DIAGNOSIS — R5383 Other fatigue: Secondary | ICD-10-CM | POA: Diagnosis not present

## 2017-09-19 DIAGNOSIS — M353 Polymyalgia rheumatica: Secondary | ICD-10-CM | POA: Diagnosis not present

## 2017-09-19 DIAGNOSIS — I779 Disorder of arteries and arterioles, unspecified: Secondary | ICD-10-CM | POA: Diagnosis not present

## 2017-09-19 DIAGNOSIS — Z23 Encounter for immunization: Secondary | ICD-10-CM | POA: Diagnosis not present

## 2017-09-19 DIAGNOSIS — E782 Mixed hyperlipidemia: Secondary | ICD-10-CM | POA: Diagnosis not present

## 2017-09-19 DIAGNOSIS — N183 Chronic kidney disease, stage 3 (moderate): Secondary | ICD-10-CM | POA: Diagnosis not present

## 2017-10-02 DIAGNOSIS — M0609 Rheumatoid arthritis without rheumatoid factor, multiple sites: Secondary | ICD-10-CM | POA: Diagnosis not present

## 2017-10-02 DIAGNOSIS — R29898 Other symptoms and signs involving the musculoskeletal system: Secondary | ICD-10-CM | POA: Diagnosis not present

## 2017-10-02 DIAGNOSIS — E669 Obesity, unspecified: Secondary | ICD-10-CM | POA: Diagnosis not present

## 2017-10-02 DIAGNOSIS — M353 Polymyalgia rheumatica: Secondary | ICD-10-CM | POA: Diagnosis not present

## 2017-10-02 DIAGNOSIS — Z7952 Long term (current) use of systemic steroids: Secondary | ICD-10-CM | POA: Diagnosis not present

## 2017-10-02 DIAGNOSIS — Z6830 Body mass index (BMI) 30.0-30.9, adult: Secondary | ICD-10-CM | POA: Diagnosis not present

## 2017-10-02 DIAGNOSIS — R110 Nausea: Secondary | ICD-10-CM | POA: Diagnosis not present

## 2017-10-02 DIAGNOSIS — M791 Myalgia, unspecified site: Secondary | ICD-10-CM | POA: Diagnosis not present

## 2017-10-05 ENCOUNTER — Other Ambulatory Visit: Payer: Self-pay | Admitting: Physician Assistant

## 2017-10-05 DIAGNOSIS — Z7952 Long term (current) use of systemic steroids: Secondary | ICD-10-CM

## 2017-10-15 ENCOUNTER — Other Ambulatory Visit: Payer: Self-pay | Admitting: Physician Assistant

## 2017-10-15 DIAGNOSIS — Z7952 Long term (current) use of systemic steroids: Secondary | ICD-10-CM

## 2017-11-07 ENCOUNTER — Other Ambulatory Visit: Payer: Self-pay | Admitting: Internal Medicine

## 2017-11-07 DIAGNOSIS — Z1231 Encounter for screening mammogram for malignant neoplasm of breast: Secondary | ICD-10-CM

## 2017-11-08 ENCOUNTER — Ambulatory Visit
Admission: RE | Admit: 2017-11-08 | Discharge: 2017-11-08 | Disposition: A | Discharge status: Home / Self Care | Payer: Medicare Other | Source: Ambulatory Visit | Attending: Physician Assistant | Admitting: Physician Assistant

## 2017-11-08 DIAGNOSIS — Z7952 Long term (current) use of systemic steroids: Secondary | ICD-10-CM

## 2017-11-08 DIAGNOSIS — Z780 Asymptomatic menopausal state: Secondary | ICD-10-CM | POA: Diagnosis not present

## 2017-11-08 DIAGNOSIS — M8589 Other specified disorders of bone density and structure, multiple sites: Secondary | ICD-10-CM | POA: Diagnosis not present

## 2017-11-11 DIAGNOSIS — M0609 Rheumatoid arthritis without rheumatoid factor, multiple sites: Secondary | ICD-10-CM | POA: Diagnosis not present

## 2017-11-11 DIAGNOSIS — Z7952 Long term (current) use of systemic steroids: Secondary | ICD-10-CM | POA: Diagnosis not present

## 2017-11-19 ENCOUNTER — Ambulatory Visit
Admission: RE | Admit: 2017-11-19 | Discharge: 2017-11-19 | Disposition: A | Discharge status: Home / Self Care | Payer: Medicare Other | Source: Ambulatory Visit | Attending: Internal Medicine | Admitting: Internal Medicine

## 2017-11-19 DIAGNOSIS — Z1231 Encounter for screening mammogram for malignant neoplasm of breast: Secondary | ICD-10-CM | POA: Diagnosis not present

## 2018-01-02 DIAGNOSIS — Z6832 Body mass index (BMI) 32.0-32.9, adult: Secondary | ICD-10-CM | POA: Diagnosis not present

## 2018-01-02 DIAGNOSIS — M791 Myalgia, unspecified site: Secondary | ICD-10-CM | POA: Diagnosis not present

## 2018-01-02 DIAGNOSIS — R110 Nausea: Secondary | ICD-10-CM | POA: Diagnosis not present

## 2018-01-02 DIAGNOSIS — R29898 Other symptoms and signs involving the musculoskeletal system: Secondary | ICD-10-CM | POA: Diagnosis not present

## 2018-01-02 DIAGNOSIS — E669 Obesity, unspecified: Secondary | ICD-10-CM | POA: Diagnosis not present

## 2018-01-02 DIAGNOSIS — Z7952 Long term (current) use of systemic steroids: Secondary | ICD-10-CM | POA: Diagnosis not present

## 2018-01-02 DIAGNOSIS — M353 Polymyalgia rheumatica: Secondary | ICD-10-CM | POA: Diagnosis not present

## 2018-01-02 DIAGNOSIS — M0609 Rheumatoid arthritis without rheumatoid factor, multiple sites: Secondary | ICD-10-CM | POA: Diagnosis not present

## 2018-01-06 DIAGNOSIS — M0609 Rheumatoid arthritis without rheumatoid factor, multiple sites: Secondary | ICD-10-CM | POA: Diagnosis not present

## 2018-01-06 DIAGNOSIS — Z79899 Other long term (current) drug therapy: Secondary | ICD-10-CM | POA: Diagnosis not present

## 2018-03-03 DIAGNOSIS — M0609 Rheumatoid arthritis without rheumatoid factor, multiple sites: Secondary | ICD-10-CM | POA: Diagnosis not present

## 2018-03-03 DIAGNOSIS — Z79899 Other long term (current) drug therapy: Secondary | ICD-10-CM | POA: Diagnosis not present

## 2018-04-02 DIAGNOSIS — Z6830 Body mass index (BMI) 30.0-30.9, adult: Secondary | ICD-10-CM | POA: Diagnosis not present

## 2018-04-02 DIAGNOSIS — M0609 Rheumatoid arthritis without rheumatoid factor, multiple sites: Secondary | ICD-10-CM | POA: Diagnosis not present

## 2018-04-02 DIAGNOSIS — Z79899 Other long term (current) drug therapy: Secondary | ICD-10-CM | POA: Diagnosis not present

## 2018-04-02 DIAGNOSIS — M2550 Pain in unspecified joint: Secondary | ICD-10-CM | POA: Diagnosis not present

## 2018-04-02 DIAGNOSIS — M48061 Spinal stenosis, lumbar region without neurogenic claudication: Secondary | ICD-10-CM | POA: Diagnosis not present

## 2018-04-02 DIAGNOSIS — E669 Obesity, unspecified: Secondary | ICD-10-CM | POA: Diagnosis not present

## 2018-04-03 DIAGNOSIS — Z23 Encounter for immunization: Secondary | ICD-10-CM | POA: Diagnosis not present

## 2018-04-03 DIAGNOSIS — I779 Disorder of arteries and arterioles, unspecified: Secondary | ICD-10-CM | POA: Diagnosis not present

## 2018-04-03 DIAGNOSIS — N390 Urinary tract infection, site not specified: Secondary | ICD-10-CM | POA: Diagnosis not present

## 2018-04-03 DIAGNOSIS — Z0000 Encounter for general adult medical examination without abnormal findings: Secondary | ICD-10-CM | POA: Diagnosis not present

## 2018-04-03 DIAGNOSIS — D509 Iron deficiency anemia, unspecified: Secondary | ICD-10-CM | POA: Diagnosis not present

## 2018-04-03 DIAGNOSIS — E782 Mixed hyperlipidemia: Secondary | ICD-10-CM | POA: Diagnosis not present

## 2018-04-03 DIAGNOSIS — N183 Chronic kidney disease, stage 3 (moderate): Secondary | ICD-10-CM | POA: Diagnosis not present

## 2018-04-03 DIAGNOSIS — M353 Polymyalgia rheumatica: Secondary | ICD-10-CM | POA: Diagnosis not present

## 2018-04-10 DIAGNOSIS — E782 Mixed hyperlipidemia: Secondary | ICD-10-CM | POA: Diagnosis not present

## 2018-04-10 DIAGNOSIS — D509 Iron deficiency anemia, unspecified: Secondary | ICD-10-CM | POA: Diagnosis not present

## 2018-04-10 DIAGNOSIS — M353 Polymyalgia rheumatica: Secondary | ICD-10-CM | POA: Diagnosis not present

## 2018-04-10 DIAGNOSIS — N183 Chronic kidney disease, stage 3 (moderate): Secondary | ICD-10-CM | POA: Diagnosis not present

## 2018-04-10 DIAGNOSIS — I779 Disorder of arteries and arterioles, unspecified: Secondary | ICD-10-CM | POA: Diagnosis not present

## 2018-04-28 DIAGNOSIS — M0609 Rheumatoid arthritis without rheumatoid factor, multiple sites: Secondary | ICD-10-CM | POA: Diagnosis not present

## 2018-04-28 DIAGNOSIS — Z79899 Other long term (current) drug therapy: Secondary | ICD-10-CM | POA: Diagnosis not present

## 2018-06-23 DIAGNOSIS — M0609 Rheumatoid arthritis without rheumatoid factor, multiple sites: Secondary | ICD-10-CM | POA: Diagnosis not present

## 2018-07-03 DIAGNOSIS — Z6830 Body mass index (BMI) 30.0-30.9, adult: Secondary | ICD-10-CM | POA: Diagnosis not present

## 2018-07-03 DIAGNOSIS — E669 Obesity, unspecified: Secondary | ICD-10-CM | POA: Diagnosis not present

## 2018-07-03 DIAGNOSIS — Z79899 Other long term (current) drug therapy: Secondary | ICD-10-CM | POA: Diagnosis not present

## 2018-07-03 DIAGNOSIS — M2550 Pain in unspecified joint: Secondary | ICD-10-CM | POA: Diagnosis not present

## 2018-07-03 DIAGNOSIS — M48061 Spinal stenosis, lumbar region without neurogenic claudication: Secondary | ICD-10-CM | POA: Diagnosis not present

## 2018-07-03 DIAGNOSIS — M0609 Rheumatoid arthritis without rheumatoid factor, multiple sites: Secondary | ICD-10-CM | POA: Diagnosis not present

## 2018-07-25 DIAGNOSIS — Z23 Encounter for immunization: Secondary | ICD-10-CM | POA: Diagnosis not present

## 2018-08-06 DIAGNOSIS — H2513 Age-related nuclear cataract, bilateral: Secondary | ICD-10-CM | POA: Diagnosis not present

## 2018-08-06 DIAGNOSIS — D3132 Benign neoplasm of left choroid: Secondary | ICD-10-CM | POA: Diagnosis not present

## 2018-08-18 DIAGNOSIS — M0609 Rheumatoid arthritis without rheumatoid factor, multiple sites: Secondary | ICD-10-CM | POA: Diagnosis not present

## 2018-09-29 DIAGNOSIS — Z6830 Body mass index (BMI) 30.0-30.9, adult: Secondary | ICD-10-CM | POA: Diagnosis not present

## 2018-09-29 DIAGNOSIS — Z79899 Other long term (current) drug therapy: Secondary | ICD-10-CM | POA: Diagnosis not present

## 2018-09-29 DIAGNOSIS — M48061 Spinal stenosis, lumbar region without neurogenic claudication: Secondary | ICD-10-CM | POA: Diagnosis not present

## 2018-09-29 DIAGNOSIS — M2550 Pain in unspecified joint: Secondary | ICD-10-CM | POA: Diagnosis not present

## 2018-09-29 DIAGNOSIS — M0609 Rheumatoid arthritis without rheumatoid factor, multiple sites: Secondary | ICD-10-CM | POA: Diagnosis not present

## 2018-09-29 DIAGNOSIS — E669 Obesity, unspecified: Secondary | ICD-10-CM | POA: Diagnosis not present

## 2018-10-13 DIAGNOSIS — M0609 Rheumatoid arthritis without rheumatoid factor, multiple sites: Secondary | ICD-10-CM | POA: Diagnosis not present

## 2018-11-27 DIAGNOSIS — D509 Iron deficiency anemia, unspecified: Secondary | ICD-10-CM | POA: Diagnosis not present

## 2018-11-27 DIAGNOSIS — E782 Mixed hyperlipidemia: Secondary | ICD-10-CM | POA: Diagnosis not present

## 2018-11-27 DIAGNOSIS — N183 Chronic kidney disease, stage 3 (moderate): Secondary | ICD-10-CM | POA: Diagnosis not present

## 2018-11-27 DIAGNOSIS — N390 Urinary tract infection, site not specified: Secondary | ICD-10-CM | POA: Diagnosis not present

## 2018-11-27 DIAGNOSIS — M353 Polymyalgia rheumatica: Secondary | ICD-10-CM | POA: Diagnosis not present

## 2018-11-27 DIAGNOSIS — I779 Disorder of arteries and arterioles, unspecified: Secondary | ICD-10-CM | POA: Diagnosis not present

## 2018-12-04 DIAGNOSIS — I779 Disorder of arteries and arterioles, unspecified: Secondary | ICD-10-CM | POA: Diagnosis not present

## 2018-12-04 DIAGNOSIS — N183 Chronic kidney disease, stage 3 (moderate): Secondary | ICD-10-CM | POA: Diagnosis not present

## 2018-12-04 DIAGNOSIS — R1013 Epigastric pain: Secondary | ICD-10-CM | POA: Diagnosis not present

## 2018-12-04 DIAGNOSIS — R1032 Left lower quadrant pain: Secondary | ICD-10-CM | POA: Diagnosis not present

## 2018-12-04 DIAGNOSIS — D509 Iron deficiency anemia, unspecified: Secondary | ICD-10-CM | POA: Diagnosis not present

## 2018-12-04 DIAGNOSIS — R110 Nausea: Secondary | ICD-10-CM | POA: Diagnosis not present

## 2018-12-04 DIAGNOSIS — E782 Mixed hyperlipidemia: Secondary | ICD-10-CM | POA: Diagnosis not present

## 2018-12-04 DIAGNOSIS — M353 Polymyalgia rheumatica: Secondary | ICD-10-CM | POA: Diagnosis not present

## 2018-12-08 ENCOUNTER — Other Ambulatory Visit: Payer: Self-pay | Admitting: Internal Medicine

## 2018-12-08 DIAGNOSIS — M0609 Rheumatoid arthritis without rheumatoid factor, multiple sites: Secondary | ICD-10-CM | POA: Diagnosis not present

## 2018-12-08 DIAGNOSIS — Z79899 Other long term (current) drug therapy: Secondary | ICD-10-CM | POA: Diagnosis not present

## 2018-12-08 DIAGNOSIS — Z1231 Encounter for screening mammogram for malignant neoplasm of breast: Secondary | ICD-10-CM

## 2018-12-18 DIAGNOSIS — R1013 Epigastric pain: Secondary | ICD-10-CM | POA: Diagnosis not present

## 2018-12-22 DIAGNOSIS — R1013 Epigastric pain: Secondary | ICD-10-CM | POA: Diagnosis not present

## 2018-12-25 DIAGNOSIS — R1013 Epigastric pain: Secondary | ICD-10-CM | POA: Diagnosis not present

## 2018-12-25 DIAGNOSIS — A048 Other specified bacterial intestinal infections: Secondary | ICD-10-CM | POA: Diagnosis not present

## 2019-01-01 DIAGNOSIS — M48061 Spinal stenosis, lumbar region without neurogenic claudication: Secondary | ICD-10-CM | POA: Diagnosis not present

## 2019-01-01 DIAGNOSIS — Z6830 Body mass index (BMI) 30.0-30.9, adult: Secondary | ICD-10-CM | POA: Diagnosis not present

## 2019-01-01 DIAGNOSIS — M0609 Rheumatoid arthritis without rheumatoid factor, multiple sites: Secondary | ICD-10-CM | POA: Diagnosis not present

## 2019-01-01 DIAGNOSIS — M2550 Pain in unspecified joint: Secondary | ICD-10-CM | POA: Diagnosis not present

## 2019-01-01 DIAGNOSIS — Z79899 Other long term (current) drug therapy: Secondary | ICD-10-CM | POA: Diagnosis not present

## 2019-01-01 DIAGNOSIS — E669 Obesity, unspecified: Secondary | ICD-10-CM | POA: Diagnosis not present

## 2019-01-02 ENCOUNTER — Ambulatory Visit
Admission: RE | Admit: 2019-01-02 | Discharge: 2019-01-02 | Disposition: A | Discharge status: Home / Self Care | Payer: Medicare Other | Source: Ambulatory Visit | Attending: Internal Medicine | Admitting: Internal Medicine

## 2019-01-02 DIAGNOSIS — Z1231 Encounter for screening mammogram for malignant neoplasm of breast: Secondary | ICD-10-CM | POA: Diagnosis not present

## 2019-01-10 ENCOUNTER — Emergency Department (HOSPITAL_COMMUNITY): Payer: Medicare Other

## 2019-01-10 ENCOUNTER — Other Ambulatory Visit: Payer: Self-pay

## 2019-01-10 ENCOUNTER — Emergency Department (HOSPITAL_COMMUNITY)
Admission: EM | Admit: 2019-01-10 | Discharge: 2019-01-10 | Disposition: A | Discharge status: Home / Self Care | Payer: Medicare Other | Attending: Emergency Medicine | Admitting: Emergency Medicine

## 2019-01-10 DIAGNOSIS — S299XXA Unspecified injury of thorax, initial encounter: Secondary | ICD-10-CM | POA: Diagnosis not present

## 2019-01-10 DIAGNOSIS — S0990XA Unspecified injury of head, initial encounter: Secondary | ICD-10-CM | POA: Diagnosis not present

## 2019-01-10 DIAGNOSIS — R51 Headache: Secondary | ICD-10-CM | POA: Diagnosis not present

## 2019-01-10 DIAGNOSIS — R109 Unspecified abdominal pain: Secondary | ICD-10-CM | POA: Diagnosis not present

## 2019-01-10 DIAGNOSIS — Z79899 Other long term (current) drug therapy: Secondary | ICD-10-CM | POA: Insufficient documentation

## 2019-01-10 DIAGNOSIS — Y929 Unspecified place or not applicable: Secondary | ICD-10-CM | POA: Insufficient documentation

## 2019-01-10 DIAGNOSIS — M542 Cervicalgia: Secondary | ICD-10-CM | POA: Insufficient documentation

## 2019-01-10 DIAGNOSIS — R10819 Abdominal tenderness, unspecified site: Secondary | ICD-10-CM | POA: Insufficient documentation

## 2019-01-10 DIAGNOSIS — R0789 Other chest pain: Secondary | ICD-10-CM | POA: Diagnosis not present

## 2019-01-10 DIAGNOSIS — R079 Chest pain, unspecified: Secondary | ICD-10-CM | POA: Diagnosis not present

## 2019-01-10 DIAGNOSIS — Y999 Unspecified external cause status: Secondary | ICD-10-CM | POA: Diagnosis not present

## 2019-01-10 DIAGNOSIS — Y939 Activity, unspecified: Secondary | ICD-10-CM | POA: Insufficient documentation

## 2019-01-10 DIAGNOSIS — S199XXA Unspecified injury of neck, initial encounter: Secondary | ICD-10-CM | POA: Diagnosis not present

## 2019-01-10 DIAGNOSIS — S3991XA Unspecified injury of abdomen, initial encounter: Secondary | ICD-10-CM | POA: Diagnosis not present

## 2019-01-10 DIAGNOSIS — I10 Essential (primary) hypertension: Secondary | ICD-10-CM | POA: Diagnosis not present

## 2019-01-10 LAB — COMPREHENSIVE METABOLIC PANEL
ALT: 40 U/L (ref 0–44)
AST: 30 U/L (ref 15–41)
Albumin: 4 g/dL (ref 3.5–5.0)
Alkaline Phosphatase: 56 U/L (ref 38–126)
Anion gap: 11 (ref 5–15)
BUN: 12 mg/dL (ref 8–23)
CO2: 22 mmol/L (ref 22–32)
Calcium: 9 mg/dL (ref 8.9–10.3)
Chloride: 107 mmol/L (ref 98–111)
Creatinine, Ser: 1.38 mg/dL — ABNORMAL HIGH (ref 0.44–1.00)
GFR calc Af Amer: 46 mL/min — ABNORMAL LOW (ref >60)
GFR calc non Af Amer: 40 mL/min — ABNORMAL LOW (ref >60)
Glucose, Bld: 100 mg/dL — ABNORMAL HIGH (ref 70–99)
Potassium: 4 mmol/L (ref 3.5–5.1)
Sodium: 140 mmol/L (ref 135–145)
Total Bilirubin: 0.7 mg/dL (ref 0.3–1.2)
Total Protein: 6.2 g/dL — ABNORMAL LOW (ref 6.5–8.1)

## 2019-01-10 LAB — CBC
HCT: 40.4 % (ref 36.0–46.0)
Hemoglobin: 12.6 g/dL (ref 12.0–15.0)
MCH: 29.2 pg (ref 26.0–34.0)
MCHC: 31.2 g/dL (ref 30.0–36.0)
MCV: 93.7 fL (ref 80.0–100.0)
Platelets: 208 10*3/uL (ref 150–400)
RBC: 4.31 MIL/uL (ref 3.87–5.11)
RDW: 15.5 % (ref 11.5–15.5)
WBC: 6.4 10*3/uL (ref 4.0–10.5)
nRBC: 0 % (ref 0.0–0.2)

## 2019-01-10 LAB — I-STAT TROPONIN, ED: Troponin i, poc: 0 ng/mL (ref 0.00–0.08)

## 2019-01-10 LAB — LIPASE, BLOOD: Lipase: 25 U/L (ref 11–51)

## 2019-01-10 MED ORDER — IOHEXOL 300 MG/ML  SOLN
100.0000 mL | Freq: Once | INTRAMUSCULAR | Status: AC | PRN
  Administered 2019-01-10: 100 mL via INTRAVENOUS

## 2019-01-10 NOTE — ED Notes (Signed)
Patient verbalizes understanding of discharge instructions. Opportunity for questioning and answers were provided. Armband removed by staff, pt discharged from ED.  
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
## 2019-02-02 DIAGNOSIS — M0609 Rheumatoid arthritis without rheumatoid factor, multiple sites: Secondary | ICD-10-CM | POA: Diagnosis not present

## 2019-02-02 DIAGNOSIS — R945 Abnormal results of liver function studies: Secondary | ICD-10-CM | POA: Diagnosis not present

## 2019-02-02 DIAGNOSIS — Z7952 Long term (current) use of systemic steroids: Secondary | ICD-10-CM | POA: Diagnosis not present

## 2019-03-31 DIAGNOSIS — M0609 Rheumatoid arthritis without rheumatoid factor, multiple sites: Secondary | ICD-10-CM | POA: Diagnosis not present

## 2019-03-31 DIAGNOSIS — Z7952 Long term (current) use of systemic steroids: Secondary | ICD-10-CM | POA: Diagnosis not present

## 2019-04-01 DIAGNOSIS — M2550 Pain in unspecified joint: Secondary | ICD-10-CM | POA: Diagnosis not present

## 2019-04-01 DIAGNOSIS — E663 Overweight: Secondary | ICD-10-CM | POA: Diagnosis not present

## 2019-04-01 DIAGNOSIS — Z79899 Other long term (current) drug therapy: Secondary | ICD-10-CM | POA: Diagnosis not present

## 2019-04-01 DIAGNOSIS — Z6829 Body mass index (BMI) 29.0-29.9, adult: Secondary | ICD-10-CM | POA: Diagnosis not present

## 2019-04-01 DIAGNOSIS — M48061 Spinal stenosis, lumbar region without neurogenic claudication: Secondary | ICD-10-CM | POA: Diagnosis not present

## 2019-04-01 DIAGNOSIS — M0609 Rheumatoid arthritis without rheumatoid factor, multiple sites: Secondary | ICD-10-CM | POA: Diagnosis not present

## 2019-04-01 DIAGNOSIS — R7989 Other specified abnormal findings of blood chemistry: Secondary | ICD-10-CM | POA: Diagnosis not present

## 2019-05-26 DIAGNOSIS — Z7952 Long term (current) use of systemic steroids: Secondary | ICD-10-CM | POA: Diagnosis not present

## 2019-05-26 DIAGNOSIS — M0609 Rheumatoid arthritis without rheumatoid factor, multiple sites: Secondary | ICD-10-CM | POA: Diagnosis not present

## 2019-06-04 DIAGNOSIS — Z780 Asymptomatic menopausal state: Secondary | ICD-10-CM | POA: Diagnosis not present

## 2019-06-04 DIAGNOSIS — Z0000 Encounter for general adult medical examination without abnormal findings: Secondary | ICD-10-CM | POA: Diagnosis not present

## 2019-06-04 DIAGNOSIS — E782 Mixed hyperlipidemia: Secondary | ICD-10-CM | POA: Diagnosis not present

## 2019-06-04 DIAGNOSIS — K769 Liver disease, unspecified: Secondary | ICD-10-CM | POA: Diagnosis not present

## 2019-06-04 DIAGNOSIS — N183 Chronic kidney disease, stage 3 (moderate): Secondary | ICD-10-CM | POA: Diagnosis not present

## 2019-06-04 DIAGNOSIS — D509 Iron deficiency anemia, unspecified: Secondary | ICD-10-CM | POA: Diagnosis not present

## 2019-06-04 DIAGNOSIS — I779 Disorder of arteries and arterioles, unspecified: Secondary | ICD-10-CM | POA: Diagnosis not present

## 2019-06-04 DIAGNOSIS — Z23 Encounter for immunization: Secondary | ICD-10-CM | POA: Diagnosis not present

## 2019-06-04 DIAGNOSIS — M353 Polymyalgia rheumatica: Secondary | ICD-10-CM | POA: Diagnosis not present

## 2019-06-04 DIAGNOSIS — M8580 Other specified disorders of bone density and structure, unspecified site: Secondary | ICD-10-CM | POA: Diagnosis not present

## 2019-06-05 ENCOUNTER — Other Ambulatory Visit: Payer: Self-pay

## 2019-06-05 DIAGNOSIS — I779 Disorder of arteries and arterioles, unspecified: Secondary | ICD-10-CM | POA: Diagnosis not present

## 2019-06-05 DIAGNOSIS — N390 Urinary tract infection, site not specified: Secondary | ICD-10-CM | POA: Diagnosis not present

## 2019-06-05 DIAGNOSIS — D509 Iron deficiency anemia, unspecified: Secondary | ICD-10-CM | POA: Diagnosis not present

## 2019-06-05 DIAGNOSIS — N183 Chronic kidney disease, stage 3 (moderate): Secondary | ICD-10-CM | POA: Diagnosis not present

## 2019-06-05 DIAGNOSIS — E782 Mixed hyperlipidemia: Secondary | ICD-10-CM | POA: Diagnosis not present

## 2019-06-08 ENCOUNTER — Other Ambulatory Visit: Payer: Self-pay | Admitting: Internal Medicine

## 2019-06-08 DIAGNOSIS — K769 Liver disease, unspecified: Secondary | ICD-10-CM

## 2019-06-19 ENCOUNTER — Ambulatory Visit
Admission: RE | Admit: 2019-06-19 | Discharge: 2019-06-19 | Disposition: A | Discharge status: Home / Self Care | Payer: Medicare Other | Source: Ambulatory Visit | Attending: Internal Medicine | Admitting: Internal Medicine

## 2019-06-19 DIAGNOSIS — K7689 Other specified diseases of liver: Secondary | ICD-10-CM | POA: Diagnosis not present

## 2019-06-19 DIAGNOSIS — K769 Liver disease, unspecified: Secondary | ICD-10-CM

## 2019-07-08 DIAGNOSIS — L720 Epidermal cyst: Secondary | ICD-10-CM | POA: Diagnosis not present

## 2019-07-21 DIAGNOSIS — M0609 Rheumatoid arthritis without rheumatoid factor, multiple sites: Secondary | ICD-10-CM | POA: Diagnosis not present

## 2019-07-21 DIAGNOSIS — Z7952 Long term (current) use of systemic steroids: Secondary | ICD-10-CM | POA: Diagnosis not present

## 2019-08-04 DIAGNOSIS — Z79899 Other long term (current) drug therapy: Secondary | ICD-10-CM | POA: Diagnosis not present

## 2019-08-04 DIAGNOSIS — G629 Polyneuropathy, unspecified: Secondary | ICD-10-CM | POA: Diagnosis not present

## 2019-08-04 DIAGNOSIS — M0609 Rheumatoid arthritis without rheumatoid factor, multiple sites: Secondary | ICD-10-CM | POA: Diagnosis not present

## 2019-08-04 DIAGNOSIS — M2550 Pain in unspecified joint: Secondary | ICD-10-CM | POA: Diagnosis not present

## 2019-08-04 DIAGNOSIS — M48061 Spinal stenosis, lumbar region without neurogenic claudication: Secondary | ICD-10-CM | POA: Diagnosis not present

## 2019-08-04 DIAGNOSIS — E669 Obesity, unspecified: Secondary | ICD-10-CM | POA: Diagnosis not present

## 2019-08-04 DIAGNOSIS — R7989 Other specified abnormal findings of blood chemistry: Secondary | ICD-10-CM | POA: Diagnosis not present

## 2019-08-04 DIAGNOSIS — Z6830 Body mass index (BMI) 30.0-30.9, adult: Secondary | ICD-10-CM | POA: Diagnosis not present

## 2019-08-31 DIAGNOSIS — Z23 Encounter for immunization: Secondary | ICD-10-CM | POA: Diagnosis not present

## 2019-09-01 ENCOUNTER — Encounter (INDEPENDENT_AMBULATORY_CARE_PROVIDER_SITE_OTHER): Payer: Self-pay

## 2019-09-15 DIAGNOSIS — M0609 Rheumatoid arthritis without rheumatoid factor, multiple sites: Secondary | ICD-10-CM | POA: Diagnosis not present

## 2019-12-01 ENCOUNTER — Other Ambulatory Visit: Payer: Self-pay | Admitting: Internal Medicine

## 2019-12-01 DIAGNOSIS — Z1231 Encounter for screening mammogram for malignant neoplasm of breast: Secondary | ICD-10-CM

## 2020-01-05 DIAGNOSIS — M0609 Rheumatoid arthritis without rheumatoid factor, multiple sites: Secondary | ICD-10-CM | POA: Diagnosis not present

## 2020-01-06 ENCOUNTER — Ambulatory Visit
Admission: RE | Admit: 2020-01-06 | Discharge: 2020-01-06 | Disposition: A | Discharge status: Home / Self Care | Payer: Medicare Other | Source: Ambulatory Visit | Attending: Internal Medicine | Admitting: Internal Medicine

## 2020-01-06 ENCOUNTER — Other Ambulatory Visit: Payer: Self-pay

## 2020-01-06 DIAGNOSIS — Z1231 Encounter for screening mammogram for malignant neoplasm of breast: Secondary | ICD-10-CM

## 2020-01-14 DIAGNOSIS — M353 Polymyalgia rheumatica: Secondary | ICD-10-CM | POA: Diagnosis not present

## 2020-01-14 DIAGNOSIS — I779 Disorder of arteries and arterioles, unspecified: Secondary | ICD-10-CM | POA: Diagnosis not present

## 2020-01-14 DIAGNOSIS — N183 Chronic kidney disease, stage 3 unspecified: Secondary | ICD-10-CM | POA: Diagnosis not present

## 2020-01-14 DIAGNOSIS — E782 Mixed hyperlipidemia: Secondary | ICD-10-CM | POA: Diagnosis not present

## 2020-01-21 DIAGNOSIS — M353 Polymyalgia rheumatica: Secondary | ICD-10-CM | POA: Diagnosis not present

## 2020-01-21 DIAGNOSIS — N1831 Chronic kidney disease, stage 3a: Secondary | ICD-10-CM | POA: Diagnosis not present

## 2020-01-21 DIAGNOSIS — D509 Iron deficiency anemia, unspecified: Secondary | ICD-10-CM | POA: Diagnosis not present

## 2020-01-21 DIAGNOSIS — K769 Liver disease, unspecified: Secondary | ICD-10-CM | POA: Diagnosis not present

## 2020-01-21 DIAGNOSIS — E782 Mixed hyperlipidemia: Secondary | ICD-10-CM | POA: Diagnosis not present

## 2020-03-01 DIAGNOSIS — Z7952 Long term (current) use of systemic steroids: Secondary | ICD-10-CM | POA: Diagnosis not present

## 2020-03-01 DIAGNOSIS — M0609 Rheumatoid arthritis without rheumatoid factor, multiple sites: Secondary | ICD-10-CM | POA: Diagnosis not present

## 2020-03-03 DIAGNOSIS — Z6830 Body mass index (BMI) 30.0-30.9, adult: Secondary | ICD-10-CM | POA: Diagnosis not present

## 2020-03-03 DIAGNOSIS — Z79899 Other long term (current) drug therapy: Secondary | ICD-10-CM | POA: Diagnosis not present

## 2020-03-03 DIAGNOSIS — G629 Polyneuropathy, unspecified: Secondary | ICD-10-CM | POA: Diagnosis not present

## 2020-03-03 DIAGNOSIS — M0609 Rheumatoid arthritis without rheumatoid factor, multiple sites: Secondary | ICD-10-CM | POA: Diagnosis not present

## 2020-03-03 DIAGNOSIS — R7989 Other specified abnormal findings of blood chemistry: Secondary | ICD-10-CM | POA: Diagnosis not present

## 2020-03-03 DIAGNOSIS — M48061 Spinal stenosis, lumbar region without neurogenic claudication: Secondary | ICD-10-CM | POA: Diagnosis not present

## 2020-03-03 DIAGNOSIS — E669 Obesity, unspecified: Secondary | ICD-10-CM | POA: Diagnosis not present

## 2020-03-03 DIAGNOSIS — M2550 Pain in unspecified joint: Secondary | ICD-10-CM | POA: Diagnosis not present

## 2020-06-09 DIAGNOSIS — E782 Mixed hyperlipidemia: Secondary | ICD-10-CM | POA: Diagnosis not present

## 2020-06-09 DIAGNOSIS — E559 Vitamin D deficiency, unspecified: Secondary | ICD-10-CM | POA: Diagnosis not present

## 2020-06-09 DIAGNOSIS — M353 Polymyalgia rheumatica: Secondary | ICD-10-CM | POA: Diagnosis not present

## 2020-06-09 DIAGNOSIS — K769 Liver disease, unspecified: Secondary | ICD-10-CM | POA: Diagnosis not present

## 2020-06-09 DIAGNOSIS — D509 Iron deficiency anemia, unspecified: Secondary | ICD-10-CM | POA: Diagnosis not present

## 2020-06-09 DIAGNOSIS — N390 Urinary tract infection, site not specified: Secondary | ICD-10-CM | POA: Diagnosis not present

## 2020-06-09 DIAGNOSIS — N1831 Chronic kidney disease, stage 3a: Secondary | ICD-10-CM | POA: Diagnosis not present

## 2020-06-14 DIAGNOSIS — H04123 Dry eye syndrome of bilateral lacrimal glands: Secondary | ICD-10-CM | POA: Diagnosis not present

## 2020-06-14 DIAGNOSIS — H2513 Age-related nuclear cataract, bilateral: Secondary | ICD-10-CM | POA: Diagnosis not present

## 2020-06-16 DIAGNOSIS — Z0000 Encounter for general adult medical examination without abnormal findings: Secondary | ICD-10-CM | POA: Diagnosis not present

## 2020-06-16 DIAGNOSIS — E782 Mixed hyperlipidemia: Secondary | ICD-10-CM | POA: Diagnosis not present

## 2020-06-16 DIAGNOSIS — M353 Polymyalgia rheumatica: Secondary | ICD-10-CM | POA: Diagnosis not present

## 2020-06-16 DIAGNOSIS — N1831 Chronic kidney disease, stage 3a: Secondary | ICD-10-CM | POA: Diagnosis not present

## 2020-06-16 DIAGNOSIS — I779 Disorder of arteries and arterioles, unspecified: Secondary | ICD-10-CM | POA: Diagnosis not present

## 2020-06-21 DIAGNOSIS — M0609 Rheumatoid arthritis without rheumatoid factor, multiple sites: Secondary | ICD-10-CM | POA: Diagnosis not present

## 2020-06-21 DIAGNOSIS — Z79899 Other long term (current) drug therapy: Secondary | ICD-10-CM | POA: Diagnosis not present

## 2020-07-27 DIAGNOSIS — N390 Urinary tract infection, site not specified: Secondary | ICD-10-CM | POA: Diagnosis not present

## 2020-08-12 DIAGNOSIS — R1032 Left lower quadrant pain: Secondary | ICD-10-CM | POA: Diagnosis not present

## 2020-08-12 DIAGNOSIS — K2900 Acute gastritis without bleeding: Secondary | ICD-10-CM | POA: Diagnosis not present

## 2020-08-12 DIAGNOSIS — K5792 Diverticulitis of intestine, part unspecified, without perforation or abscess without bleeding: Secondary | ICD-10-CM | POA: Diagnosis not present

## 2020-08-16 DIAGNOSIS — K5732 Diverticulitis of large intestine without perforation or abscess without bleeding: Secondary | ICD-10-CM | POA: Diagnosis not present

## 2020-08-16 DIAGNOSIS — K769 Liver disease, unspecified: Secondary | ICD-10-CM | POA: Diagnosis not present

## 2020-08-29 DIAGNOSIS — Z79899 Other long term (current) drug therapy: Secondary | ICD-10-CM | POA: Diagnosis not present

## 2020-08-29 DIAGNOSIS — M0609 Rheumatoid arthritis without rheumatoid factor, multiple sites: Secondary | ICD-10-CM | POA: Diagnosis not present

## 2020-08-29 DIAGNOSIS — M353 Polymyalgia rheumatica: Secondary | ICD-10-CM | POA: Diagnosis not present

## 2020-08-29 DIAGNOSIS — K5792 Diverticulitis of intestine, part unspecified, without perforation or abscess without bleeding: Secondary | ICD-10-CM | POA: Diagnosis not present

## 2020-08-29 DIAGNOSIS — K625 Hemorrhage of anus and rectum: Secondary | ICD-10-CM | POA: Diagnosis not present

## 2020-08-30 DIAGNOSIS — R1032 Left lower quadrant pain: Secondary | ICD-10-CM | POA: Diagnosis not present

## 2020-08-30 DIAGNOSIS — Z1211 Encounter for screening for malignant neoplasm of colon: Secondary | ICD-10-CM | POA: Diagnosis not present

## 2020-08-30 DIAGNOSIS — R110 Nausea: Secondary | ICD-10-CM | POA: Diagnosis not present

## 2020-08-30 DIAGNOSIS — K5900 Constipation, unspecified: Secondary | ICD-10-CM | POA: Diagnosis not present

## 2020-08-30 DIAGNOSIS — Z86010 Personal history of colonic polyps: Secondary | ICD-10-CM | POA: Diagnosis not present

## 2020-08-30 DIAGNOSIS — K5730 Diverticulosis of large intestine without perforation or abscess without bleeding: Secondary | ICD-10-CM | POA: Diagnosis not present

## 2020-09-05 DIAGNOSIS — R7989 Other specified abnormal findings of blood chemistry: Secondary | ICD-10-CM | POA: Diagnosis not present

## 2020-09-05 DIAGNOSIS — M48061 Spinal stenosis, lumbar region without neurogenic claudication: Secondary | ICD-10-CM | POA: Diagnosis not present

## 2020-09-05 DIAGNOSIS — Z79899 Other long term (current) drug therapy: Secondary | ICD-10-CM | POA: Diagnosis not present

## 2020-09-05 DIAGNOSIS — Z6829 Body mass index (BMI) 29.0-29.9, adult: Secondary | ICD-10-CM | POA: Diagnosis not present

## 2020-09-05 DIAGNOSIS — M2550 Pain in unspecified joint: Secondary | ICD-10-CM | POA: Diagnosis not present

## 2020-09-05 DIAGNOSIS — E663 Overweight: Secondary | ICD-10-CM | POA: Diagnosis not present

## 2020-09-05 DIAGNOSIS — M0609 Rheumatoid arthritis without rheumatoid factor, multiple sites: Secondary | ICD-10-CM | POA: Diagnosis not present

## 2020-09-05 DIAGNOSIS — G629 Polyneuropathy, unspecified: Secondary | ICD-10-CM | POA: Diagnosis not present

## 2020-09-27 DIAGNOSIS — K5730 Diverticulosis of large intestine without perforation or abscess without bleeding: Secondary | ICD-10-CM | POA: Diagnosis not present

## 2020-09-27 DIAGNOSIS — M25559 Pain in unspecified hip: Secondary | ICD-10-CM | POA: Diagnosis not present

## 2020-09-27 DIAGNOSIS — Z86010 Personal history of colonic polyps: Secondary | ICD-10-CM | POA: Diagnosis not present

## 2020-10-24 DIAGNOSIS — M0609 Rheumatoid arthritis without rheumatoid factor, multiple sites: Secondary | ICD-10-CM | POA: Diagnosis not present

## 2020-10-24 DIAGNOSIS — Z79899 Other long term (current) drug therapy: Secondary | ICD-10-CM | POA: Diagnosis not present

## 2020-12-06 ENCOUNTER — Other Ambulatory Visit: Payer: Self-pay | Admitting: Internal Medicine

## 2020-12-06 DIAGNOSIS — Z1231 Encounter for screening mammogram for malignant neoplasm of breast: Secondary | ICD-10-CM

## 2020-12-19 DIAGNOSIS — M0609 Rheumatoid arthritis without rheumatoid factor, multiple sites: Secondary | ICD-10-CM | POA: Diagnosis not present

## 2020-12-19 DIAGNOSIS — Z79899 Other long term (current) drug therapy: Secondary | ICD-10-CM | POA: Diagnosis not present

## 2021-01-05 DIAGNOSIS — M353 Polymyalgia rheumatica: Secondary | ICD-10-CM | POA: Diagnosis not present

## 2021-01-05 DIAGNOSIS — M545 Low back pain, unspecified: Secondary | ICD-10-CM | POA: Diagnosis not present

## 2021-01-09 DIAGNOSIS — E663 Overweight: Secondary | ICD-10-CM | POA: Diagnosis not present

## 2021-01-09 DIAGNOSIS — R7989 Other specified abnormal findings of blood chemistry: Secondary | ICD-10-CM | POA: Diagnosis not present

## 2021-01-09 DIAGNOSIS — M5416 Radiculopathy, lumbar region: Secondary | ICD-10-CM | POA: Diagnosis not present

## 2021-01-09 DIAGNOSIS — M2550 Pain in unspecified joint: Secondary | ICD-10-CM | POA: Diagnosis not present

## 2021-01-09 DIAGNOSIS — M48061 Spinal stenosis, lumbar region without neurogenic claudication: Secondary | ICD-10-CM | POA: Diagnosis not present

## 2021-01-09 DIAGNOSIS — Z6829 Body mass index (BMI) 29.0-29.9, adult: Secondary | ICD-10-CM | POA: Diagnosis not present

## 2021-01-09 DIAGNOSIS — M0609 Rheumatoid arthritis without rheumatoid factor, multiple sites: Secondary | ICD-10-CM | POA: Diagnosis not present

## 2021-01-09 DIAGNOSIS — G629 Polyneuropathy, unspecified: Secondary | ICD-10-CM | POA: Diagnosis not present

## 2021-01-09 DIAGNOSIS — Z79899 Other long term (current) drug therapy: Secondary | ICD-10-CM | POA: Diagnosis not present

## 2021-01-19 ENCOUNTER — Ambulatory Visit
Admission: RE | Admit: 2021-01-19 | Discharge: 2021-01-19 | Disposition: A | Discharge status: Home / Self Care | Payer: Medicare Other | Source: Ambulatory Visit | Attending: Internal Medicine | Admitting: Internal Medicine

## 2021-01-19 ENCOUNTER — Other Ambulatory Visit: Payer: Self-pay

## 2021-01-19 DIAGNOSIS — Z1231 Encounter for screening mammogram for malignant neoplasm of breast: Secondary | ICD-10-CM

## 2021-01-31 DIAGNOSIS — M419 Scoliosis, unspecified: Secondary | ICD-10-CM | POA: Diagnosis not present

## 2021-01-31 DIAGNOSIS — M5136 Other intervertebral disc degeneration, lumbar region: Secondary | ICD-10-CM | POA: Diagnosis not present

## 2021-01-31 DIAGNOSIS — M5459 Other low back pain: Secondary | ICD-10-CM | POA: Diagnosis not present

## 2021-02-20 DIAGNOSIS — M0609 Rheumatoid arthritis without rheumatoid factor, multiple sites: Secondary | ICD-10-CM | POA: Diagnosis not present

## 2021-02-20 DIAGNOSIS — Z79899 Other long term (current) drug therapy: Secondary | ICD-10-CM | POA: Diagnosis not present

## 2021-02-22 DIAGNOSIS — M5416 Radiculopathy, lumbar region: Secondary | ICD-10-CM | POA: Diagnosis not present

## 2021-03-03 DIAGNOSIS — H6983 Other specified disorders of Eustachian tube, bilateral: Secondary | ICD-10-CM | POA: Diagnosis not present

## 2021-03-03 DIAGNOSIS — H6591 Unspecified nonsuppurative otitis media, right ear: Secondary | ICD-10-CM | POA: Diagnosis not present

## 2021-03-06 DIAGNOSIS — Z79899 Other long term (current) drug therapy: Secondary | ICD-10-CM | POA: Diagnosis not present

## 2021-03-06 DIAGNOSIS — G629 Polyneuropathy, unspecified: Secondary | ICD-10-CM | POA: Diagnosis not present

## 2021-03-06 DIAGNOSIS — M48061 Spinal stenosis, lumbar region without neurogenic claudication: Secondary | ICD-10-CM | POA: Diagnosis not present

## 2021-03-06 DIAGNOSIS — Z7952 Long term (current) use of systemic steroids: Secondary | ICD-10-CM | POA: Diagnosis not present

## 2021-03-06 DIAGNOSIS — M0609 Rheumatoid arthritis without rheumatoid factor, multiple sites: Secondary | ICD-10-CM | POA: Diagnosis not present

## 2021-03-06 DIAGNOSIS — R7989 Other specified abnormal findings of blood chemistry: Secondary | ICD-10-CM | POA: Diagnosis not present

## 2021-03-06 DIAGNOSIS — M2550 Pain in unspecified joint: Secondary | ICD-10-CM | POA: Diagnosis not present

## 2021-03-06 DIAGNOSIS — Z6829 Body mass index (BMI) 29.0-29.9, adult: Secondary | ICD-10-CM | POA: Diagnosis not present

## 2021-03-06 DIAGNOSIS — M5416 Radiculopathy, lumbar region: Secondary | ICD-10-CM | POA: Diagnosis not present

## 2021-03-06 DIAGNOSIS — E663 Overweight: Secondary | ICD-10-CM | POA: Diagnosis not present

## 2021-03-08 ENCOUNTER — Other Ambulatory Visit: Payer: Self-pay | Admitting: Physician Assistant

## 2021-03-08 DIAGNOSIS — Z7952 Long term (current) use of systemic steroids: Secondary | ICD-10-CM

## 2021-03-21 DIAGNOSIS — M545 Low back pain, unspecified: Secondary | ICD-10-CM | POA: Diagnosis not present

## 2021-04-17 DIAGNOSIS — M0609 Rheumatoid arthritis without rheumatoid factor, multiple sites: Secondary | ICD-10-CM | POA: Diagnosis not present

## 2021-04-17 DIAGNOSIS — Z79899 Other long term (current) drug therapy: Secondary | ICD-10-CM | POA: Diagnosis not present

## 2021-06-12 DIAGNOSIS — R5383 Other fatigue: Secondary | ICD-10-CM | POA: Diagnosis not present

## 2021-06-12 DIAGNOSIS — Z79899 Other long term (current) drug therapy: Secondary | ICD-10-CM | POA: Diagnosis not present

## 2021-06-12 DIAGNOSIS — M0609 Rheumatoid arthritis without rheumatoid factor, multiple sites: Secondary | ICD-10-CM | POA: Diagnosis not present

## 2021-06-12 DIAGNOSIS — Z111 Encounter for screening for respiratory tuberculosis: Secondary | ICD-10-CM | POA: Diagnosis not present

## 2021-06-22 DIAGNOSIS — E782 Mixed hyperlipidemia: Secondary | ICD-10-CM | POA: Diagnosis not present

## 2021-06-22 DIAGNOSIS — Z131 Encounter for screening for diabetes mellitus: Secondary | ICD-10-CM | POA: Diagnosis not present

## 2021-06-22 DIAGNOSIS — D509 Iron deficiency anemia, unspecified: Secondary | ICD-10-CM | POA: Diagnosis not present

## 2021-06-22 DIAGNOSIS — R5383 Other fatigue: Secondary | ICD-10-CM | POA: Diagnosis not present

## 2021-06-27 DIAGNOSIS — H2513 Age-related nuclear cataract, bilateral: Secondary | ICD-10-CM | POA: Diagnosis not present

## 2021-06-29 DIAGNOSIS — Z0000 Encounter for general adult medical examination without abnormal findings: Secondary | ICD-10-CM | POA: Diagnosis not present

## 2021-06-29 DIAGNOSIS — E782 Mixed hyperlipidemia: Secondary | ICD-10-CM | POA: Diagnosis not present

## 2021-06-29 DIAGNOSIS — I779 Disorder of arteries and arterioles, unspecified: Secondary | ICD-10-CM | POA: Diagnosis not present

## 2021-06-29 DIAGNOSIS — M353 Polymyalgia rheumatica: Secondary | ICD-10-CM | POA: Diagnosis not present

## 2021-06-29 DIAGNOSIS — N1831 Chronic kidney disease, stage 3a: Secondary | ICD-10-CM | POA: Diagnosis not present

## 2021-07-28 IMAGING — US ULTRASOUND ABDOMEN COMPLETE
1 series · 13 of 25 positions shown · non-contrast
Comparison: CT January 10, 2019, CT [DATE] [DATE], [DATE], [DATE] [DATE], [DATE]

CLINICAL DATA: 67-year-old female with prior CT demonstrating liver
lesion, presumed hemangioma

EXAM:
ABDOMEN ULTRASOUND COMPLETE

[Series 1: ultrasound abdomen complete · 0.19mm/px · 13 of 128 slices shown]
[im 1/128]
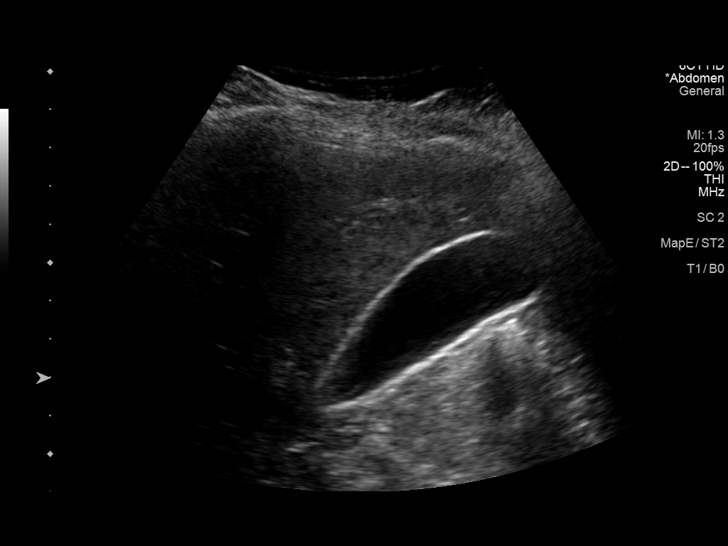
[im 11/128]
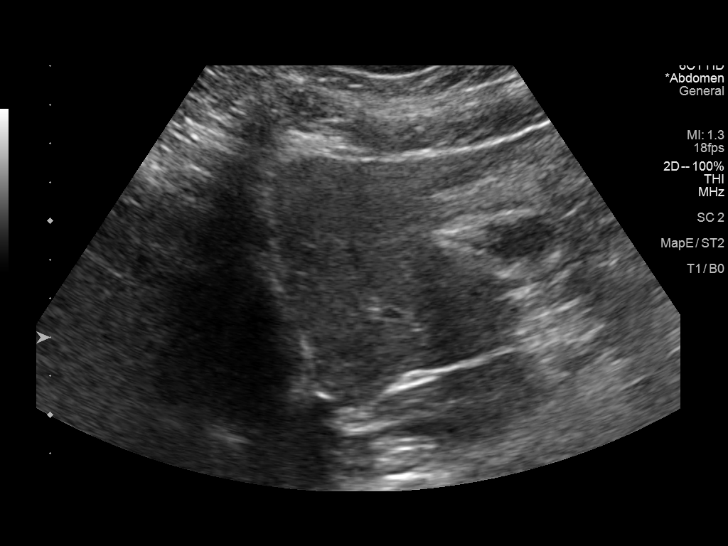
[im 22/128]
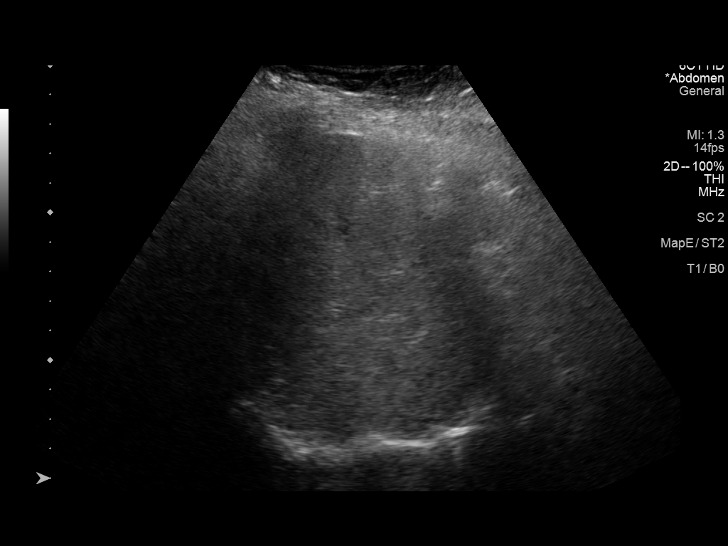
[im 32/128]
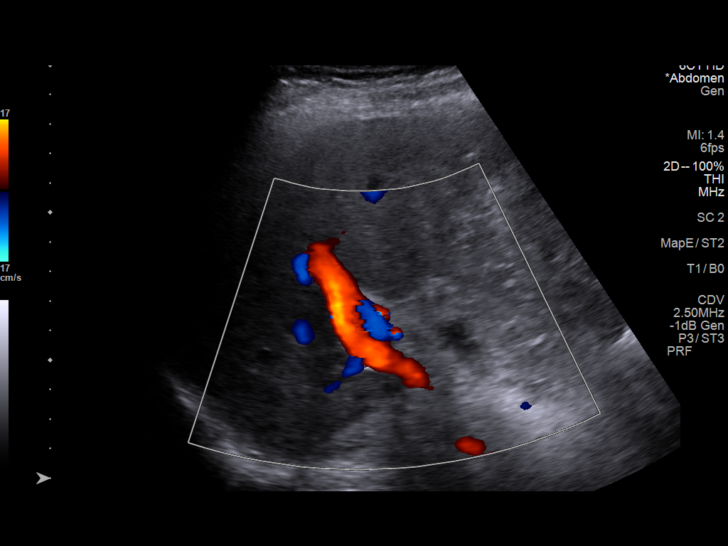
[im 43/128]
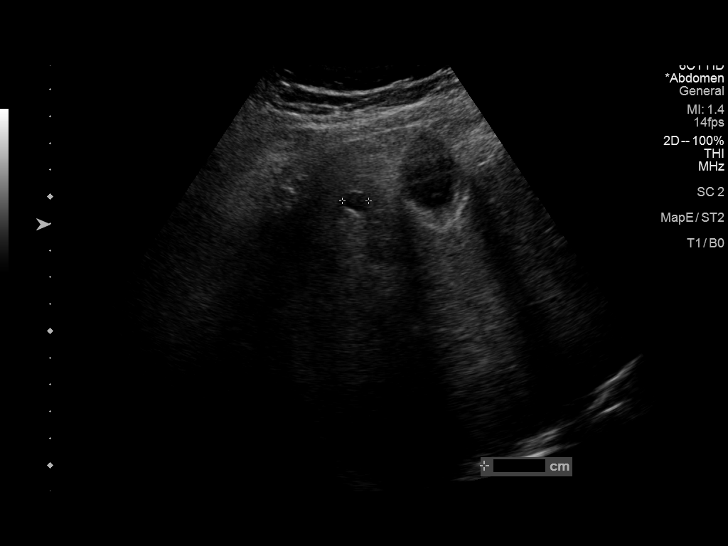
[im 53/128]
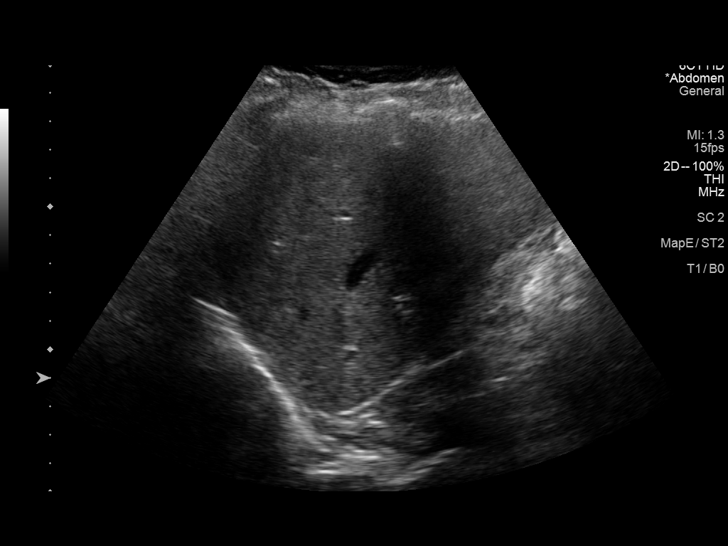
[im 64/128]
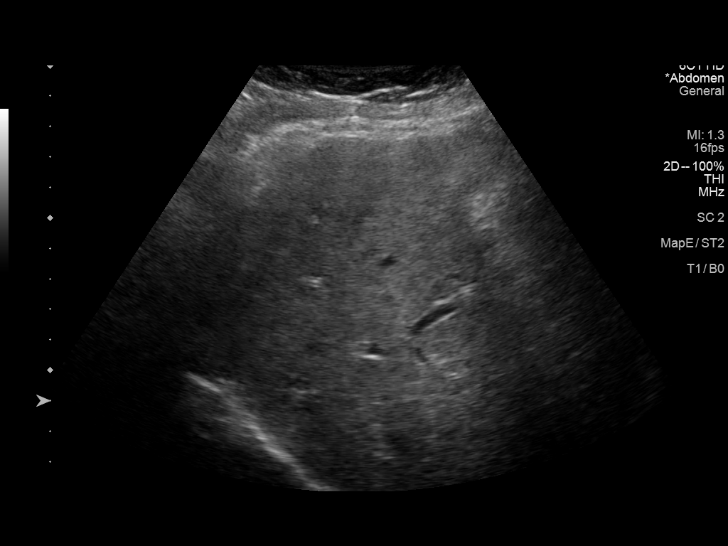
[im 75/128]
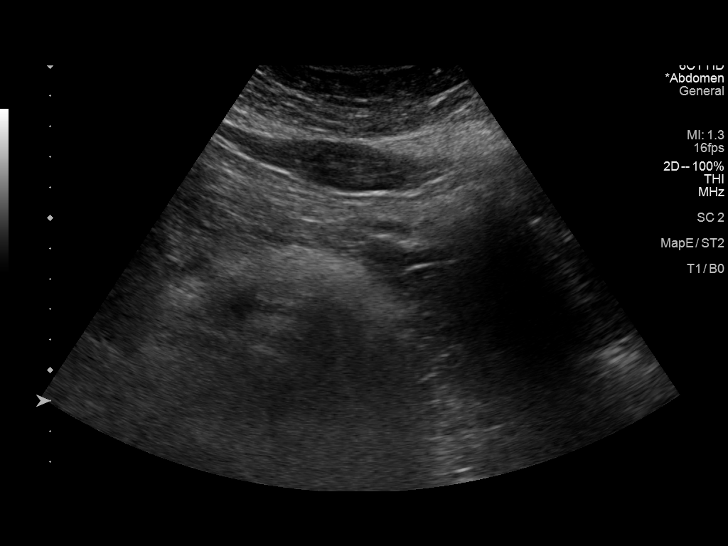
[im 85/128]
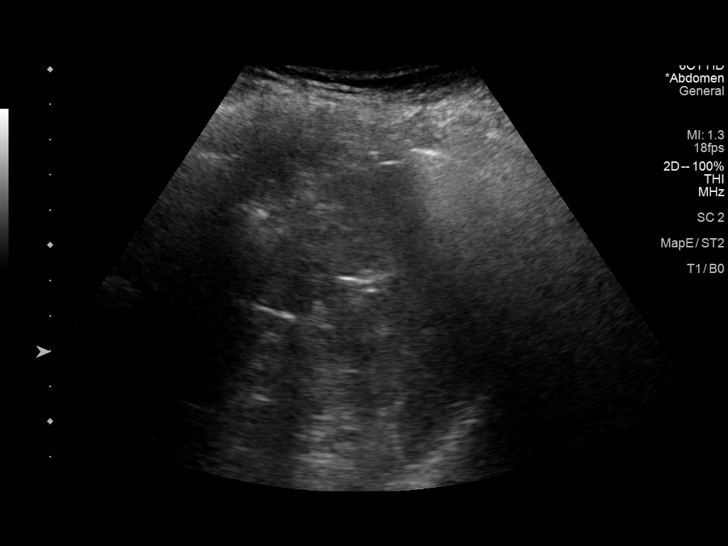
[im 96/128]
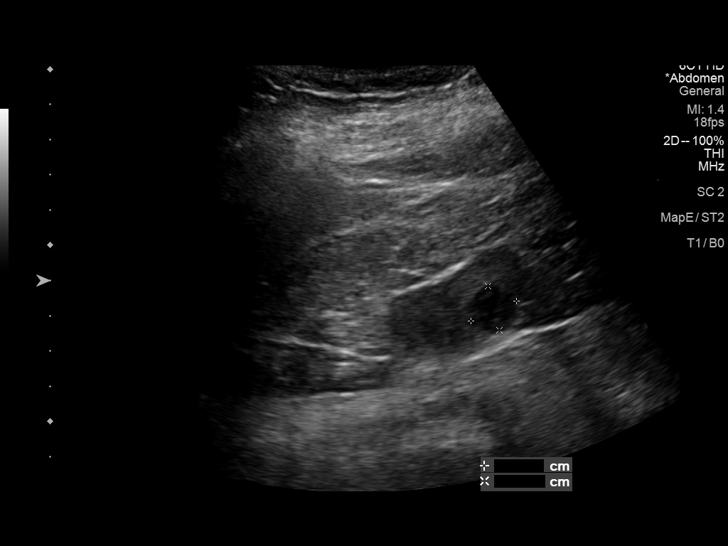
[im 106/128]
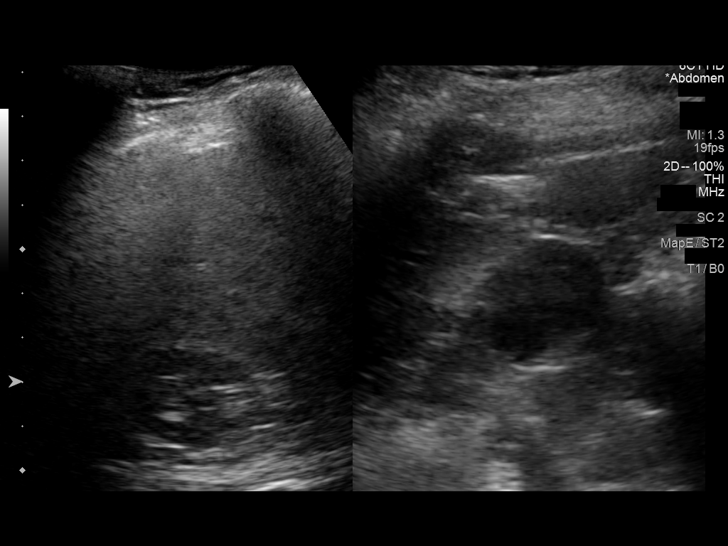
[im 117/128]
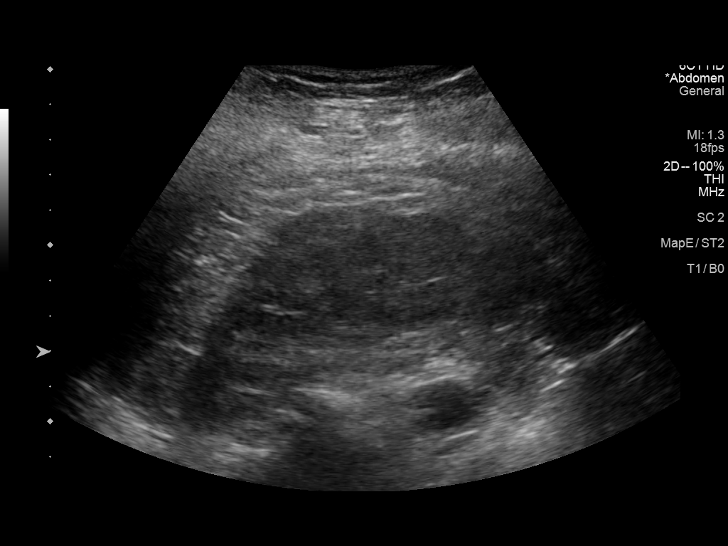
[im 128/128]
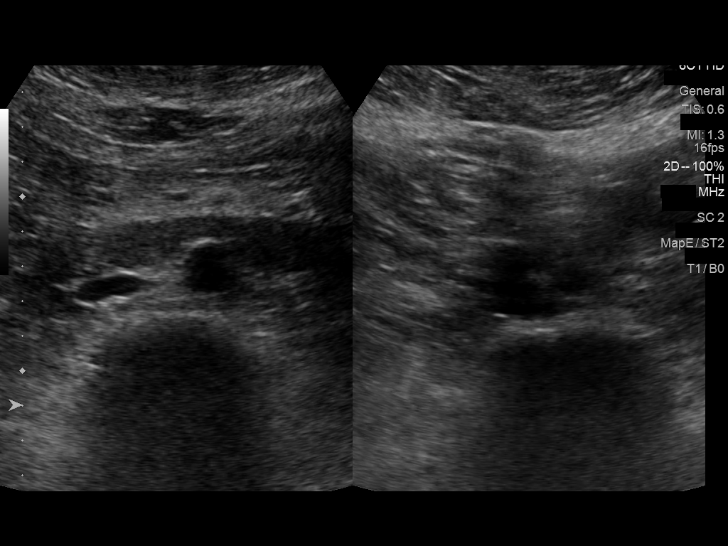

[13 of 25 positions shown; findings below may reference images not displayed]

FINDINGS: Gallbladder: No gallstones or wall thickening visualized. No
sonographic Murphy sign noted by sonographer.

Common bile duct: Diameter: 3 mm-4 mm

Liver: Heterogeneous appearance of liver parenchyma. No nodular
contour.

The previously identified left liver lesion is uniformly hypoechoic
on ultrasound, measuring 2.1 cm x 1.5 cm x 1.7 cm.

IVC: No abnormality visualized.

Pancreas: Visualized portion unremarkable.

Spleen: Size and appearance within normal limits.

Right Kidney: Length: 9.0 cm. No hydronephrosis. Anechoic cystic
lesion on the lower pole of the right kidney measures 1.4 cm x
cm x 1.8 cm with through transmission.

Left Kidney: Length: 10.2 cm. No hydronephrosis. Unremarkable renal
parenchyma compared to the contralateral kidney.

Abdominal aorta: No aneurysm visualized.

Other findings: None.
IMPRESSION: No acute finding.

The left liver lesion identified on prior imaging studies has not
changed in size significantly when dating back to the CT of June 2013. Although not definitively characterized on any prior or
current imaging, the most likely entity in the absence of known
metastatic disease would be atypical appearing hemangioma.

## 2021-08-07 DIAGNOSIS — Z79899 Other long term (current) drug therapy: Secondary | ICD-10-CM | POA: Diagnosis not present

## 2021-08-07 DIAGNOSIS — M0609 Rheumatoid arthritis without rheumatoid factor, multiple sites: Secondary | ICD-10-CM | POA: Diagnosis not present

## 2021-09-04 DIAGNOSIS — Z23 Encounter for immunization: Secondary | ICD-10-CM | POA: Diagnosis not present

## 2021-09-06 DIAGNOSIS — M0609 Rheumatoid arthritis without rheumatoid factor, multiple sites: Secondary | ICD-10-CM | POA: Diagnosis not present

## 2021-09-06 DIAGNOSIS — Z6829 Body mass index (BMI) 29.0-29.9, adult: Secondary | ICD-10-CM | POA: Diagnosis not present

## 2021-09-06 DIAGNOSIS — M2550 Pain in unspecified joint: Secondary | ICD-10-CM | POA: Diagnosis not present

## 2021-09-06 DIAGNOSIS — M48061 Spinal stenosis, lumbar region without neurogenic claudication: Secondary | ICD-10-CM | POA: Diagnosis not present

## 2021-09-06 DIAGNOSIS — E663 Overweight: Secondary | ICD-10-CM | POA: Diagnosis not present

## 2021-09-06 DIAGNOSIS — Z79899 Other long term (current) drug therapy: Secondary | ICD-10-CM | POA: Diagnosis not present

## 2021-09-18 ENCOUNTER — Other Ambulatory Visit: Payer: Self-pay

## 2021-09-18 ENCOUNTER — Ambulatory Visit
Admission: RE | Admit: 2021-09-18 | Discharge: 2021-09-18 | Disposition: A | Discharge status: Home / Self Care | Payer: Medicare Other | Source: Ambulatory Visit | Attending: Physician Assistant | Admitting: Physician Assistant

## 2021-09-18 DIAGNOSIS — Z780 Asymptomatic menopausal state: Secondary | ICD-10-CM | POA: Diagnosis not present

## 2021-09-18 DIAGNOSIS — Z7952 Long term (current) use of systemic steroids: Secondary | ICD-10-CM

## 2021-09-18 DIAGNOSIS — M8589 Other specified disorders of bone density and structure, multiple sites: Secondary | ICD-10-CM | POA: Diagnosis not present

## 2021-10-02 DIAGNOSIS — M0609 Rheumatoid arthritis without rheumatoid factor, multiple sites: Secondary | ICD-10-CM | POA: Diagnosis not present

## 2021-10-02 DIAGNOSIS — Z79899 Other long term (current) drug therapy: Secondary | ICD-10-CM | POA: Diagnosis not present

## 2021-11-27 DIAGNOSIS — M0609 Rheumatoid arthritis without rheumatoid factor, multiple sites: Secondary | ICD-10-CM | POA: Diagnosis not present

## 2021-11-27 DIAGNOSIS — Z79899 Other long term (current) drug therapy: Secondary | ICD-10-CM | POA: Diagnosis not present

## 2021-12-11 ENCOUNTER — Other Ambulatory Visit: Payer: Self-pay | Admitting: Internal Medicine

## 2021-12-11 DIAGNOSIS — Z1231 Encounter for screening mammogram for malignant neoplasm of breast: Secondary | ICD-10-CM

## 2022-01-19 DIAGNOSIS — J324 Chronic pansinusitis: Secondary | ICD-10-CM | POA: Diagnosis not present

## 2022-01-19 DIAGNOSIS — J029 Acute pharyngitis, unspecified: Secondary | ICD-10-CM | POA: Diagnosis not present

## 2022-01-19 DIAGNOSIS — R051 Acute cough: Secondary | ICD-10-CM | POA: Diagnosis not present

## 2022-01-19 DIAGNOSIS — Z20828 Contact with and (suspected) exposure to other viral communicable diseases: Secondary | ICD-10-CM | POA: Diagnosis not present

## 2022-01-19 DIAGNOSIS — R509 Fever, unspecified: Secondary | ICD-10-CM | POA: Diagnosis not present

## 2022-01-22 ENCOUNTER — Ambulatory Visit: Payer: Medicare Other

## 2022-01-29 DIAGNOSIS — Z79899 Other long term (current) drug therapy: Secondary | ICD-10-CM | POA: Diagnosis not present

## 2022-01-29 DIAGNOSIS — M0609 Rheumatoid arthritis without rheumatoid factor, multiple sites: Secondary | ICD-10-CM | POA: Diagnosis not present

## 2022-01-30 ENCOUNTER — Ambulatory Visit
Admission: RE | Admit: 2022-01-30 | Discharge: 2022-01-30 | Disposition: A | Discharge status: Home / Self Care | Payer: Medicare Other | Source: Ambulatory Visit | Attending: Internal Medicine | Admitting: Internal Medicine

## 2022-01-30 DIAGNOSIS — Z1231 Encounter for screening mammogram for malignant neoplasm of breast: Secondary | ICD-10-CM | POA: Diagnosis not present

## 2022-03-21 DIAGNOSIS — Z6829 Body mass index (BMI) 29.0-29.9, adult: Secondary | ICD-10-CM | POA: Diagnosis not present

## 2022-03-21 DIAGNOSIS — M1991 Primary osteoarthritis, unspecified site: Secondary | ICD-10-CM | POA: Diagnosis not present

## 2022-03-21 DIAGNOSIS — M48061 Spinal stenosis, lumbar region without neurogenic claudication: Secondary | ICD-10-CM | POA: Diagnosis not present

## 2022-03-21 DIAGNOSIS — E663 Overweight: Secondary | ICD-10-CM | POA: Diagnosis not present

## 2022-03-21 DIAGNOSIS — Z79899 Other long term (current) drug therapy: Secondary | ICD-10-CM | POA: Diagnosis not present

## 2022-03-21 DIAGNOSIS — M0609 Rheumatoid arthritis without rheumatoid factor, multiple sites: Secondary | ICD-10-CM | POA: Diagnosis not present

## 2022-03-26 DIAGNOSIS — M0609 Rheumatoid arthritis without rheumatoid factor, multiple sites: Secondary | ICD-10-CM | POA: Diagnosis not present

## 2022-04-04 DIAGNOSIS — Z8679 Personal history of other diseases of the circulatory system: Secondary | ICD-10-CM | POA: Diagnosis not present

## 2022-04-04 DIAGNOSIS — I700 Atherosclerosis of aorta: Secondary | ICD-10-CM | POA: Diagnosis not present

## 2022-04-04 DIAGNOSIS — I10 Essential (primary) hypertension: Secondary | ICD-10-CM | POA: Diagnosis not present

## 2022-04-04 DIAGNOSIS — R519 Headache, unspecified: Secondary | ICD-10-CM | POA: Diagnosis not present

## 2022-04-04 DIAGNOSIS — E782 Mixed hyperlipidemia: Secondary | ICD-10-CM | POA: Diagnosis not present

## 2022-04-04 DIAGNOSIS — R079 Chest pain, unspecified: Secondary | ICD-10-CM | POA: Diagnosis not present

## 2022-04-13 ENCOUNTER — Other Ambulatory Visit: Payer: Self-pay | Admitting: Registered Nurse

## 2022-04-13 DIAGNOSIS — Z8679 Personal history of other diseases of the circulatory system: Secondary | ICD-10-CM

## 2022-04-19 ENCOUNTER — Ambulatory Visit: Payer: PRIVATE HEALTH INSURANCE

## 2022-04-19 DIAGNOSIS — Z8679 Personal history of other diseases of the circulatory system: Secondary | ICD-10-CM | POA: Diagnosis not present

## 2022-04-19 DIAGNOSIS — Z09 Encounter for follow-up examination after completed treatment for conditions other than malignant neoplasm: Secondary | ICD-10-CM | POA: Diagnosis not present

## 2022-05-01 ENCOUNTER — Encounter: Payer: Self-pay | Admitting: Cardiology

## 2022-05-01 ENCOUNTER — Ambulatory Visit: Payer: Medicare Other | Admitting: Cardiology

## 2022-05-01 VITALS — BP 140/80 | HR 71 | Temp 98.4°F | Resp 16 | Ht 63.0 in | Wt 166.2 lb

## 2022-05-01 DIAGNOSIS — I6523 Occlusion and stenosis of bilateral carotid arteries: Secondary | ICD-10-CM

## 2022-05-01 DIAGNOSIS — R072 Precordial pain: Secondary | ICD-10-CM

## 2022-05-01 DIAGNOSIS — I700 Atherosclerosis of aorta: Secondary | ICD-10-CM

## 2022-05-01 DIAGNOSIS — I10 Essential (primary) hypertension: Secondary | ICD-10-CM

## 2022-05-01 MED ORDER — ASPIRIN 81 MG PO TBEC: 81.0000 mg | DELAYED_RELEASE_TABLET | Freq: Every day | ORAL | 12 refills | Status: DC

## 2022-05-01 NOTE — Progress Notes (Signed)
ID:  Jenny Glass, DOB [birth year], MRN [id number]  PCP:  Georgianne Fick, MD  Cardiologist:  Tessa Lerner, DO, [facility] (established care 05/01/2022)  REASON FOR CONSULT: Hypertension, Chest pain.   REQUESTING PHYSICIAN:  Georgianne Fick, MD [address] [place],  [place] [address]  Chief Complaint  Patient presents with   Hypertension   Chest Pain   New Patient (Initial Visit)    HPI  Jenny Glass is a 71 y.o. Caucasian female who presents to the clinic for evaluation of hypertension and chest pain at the request of Georgianne Fick, MD. Her past medical history and cardiovascular risk factors include: Polymyalgia rheumatica, carotid artery stenosis left greater than right, hepatic hemangiomas, mixed hyperlipidemia, thoracic aortic atherosclerosis.   Chest pain: Was having episodes approximately 2 weeks ago currently asymptomatic.  She describes the discomfort as achy-like sensation, worse when laying down, better with sitting up and walking around, no worsening factors, intensity when present 10 out of 10, lasted for a few minutes, left-sided, nonradiating, no symptoms for the last 1 week.  Patient recently got started on antihypertensive medications.  She was started on losartan 50 mg p.o. daily as well as amlodipine 5 mg p.o. daily.  Her blood pressures at home range between 120-140 mmHg.  I do not have a blood pressure log to review.  Her antihypertensive medications are currently being managed by PCP.  She has been having a nonproductive cough since initiation of losartan.  She also had a carotid duplex secondary to trouble swallowing and coughing which notes carotid artery stenosis left greater than right.  She denies any vision changes, focal neurological deficits, near-syncope or syncope.   FUNCTIONAL STATUS: No structured exercise program or daily routine.   ALLERGIES: No Known Allergies  MEDICATION LIST PRIOR TO VISIT: Current Meds  Medication  Sig   amLODipine (NORVASC) 5 MG tablet Take 5 mg by mouth every morning.   aspirin EC 81 MG tablet Take 1 tablet (81 mg total) by mouth daily. Swallow whole.   Calcium Carb-Cholecalciferol (CALCIUM + D3 PO) Take 1 tablet by mouth daily. Reported on 05/24/2016   losartan (COZAAR) 50 MG tablet Take 50 mg by mouth daily at 10 pm.   Methotrexate Sodium (METHOTREXATE, PF,) 50 MG/2ML injection Inject into the muscle once a week.   NON FORMULARY Infusion/simponi once every 6 weeks   predniSONE (DELTASONE) 1 MG tablet Take 2 mg by mouth daily.   rosuvastatin (CRESTOR) 10 MG tablet Take 10 mg by mouth daily.     PAST MEDICAL HISTORY: Past Medical History:  Diagnosis Date   Carotid artery stenosis    Chronic back pain    Diverticulitis    Hyperlipidemia    Hypertension    Numbness and tingling    hands bilat and lower legs bilat comes and goes    UTI (lower urinary tract infection)     PAST SURGICAL HISTORY: Past Surgical History:  Procedure Laterality Date   SHOULDER OPEN ROTATOR CUFF REPAIR Left 11/10/2015   Procedure: LEFT SHOULDER MINI OPEN ROTATOR CUFF REPAIR AND DISTAL CLAVICAL RESECTION;  Surgeon: Jene Every, MD;  Location: [facility];  Service: Orthopedics;  Laterality: Left;    FAMILY HISTORY: The patient family history includes Cancer in her father and mother.  SOCIAL HISTORY:  The patient  reports that she has never smoked. She has never used smokeless tobacco. She reports that she does not drink alcohol and does not use drugs.  REVIEW OF SYSTEMS: Review of  Systems  Cardiovascular:  Positive for chest pain. Negative for dyspnea on exertion, leg swelling, near-syncope, orthopnea, palpitations, paroxysmal nocturnal dyspnea and syncope.  Respiratory:  Positive for cough.   Neurological:  Positive for headaches.    PHYSICAL EXAM:    05/01/2022    9:26 AM 01/10/2019   11:15 PM 01/10/2019   11:00 PM  Vitals with BMI  Height 5\' 3"     Weight 166 lbs 3 oz    BMI 29.45    Systolic  140 143 134  Diastolic 80 80 94  Pulse 71 59 71    CONSTITUTIONAL: Well-developed and well-nourished. No acute distress.  SKIN: Skin is warm and dry. No rash noted. No cyanosis. No pallor. No jaundice HEAD: Normocephalic and atraumatic.  EYES: No scleral icterus MOUTH/THROAT: Moist oral membranes.  NECK: No JVD present. No thyromegaly noted. No carotid bruits  LYMPHATIC: No visible cervical adenopathy.  CHEST Normal respiratory effort. No intercostal retractions  LUNGS: Clear to auscultation bilaterally.  No stridor. No wheezes. No rales.  CARDIOVASCULAR: Regular rate and rhythm, positive S1-S2, no murmurs rubs or gallops appreciated. ABDOMINAL: Soft, nontender, nondistended, positive bowel sounds all 4 quadrants. No apparent ascites.  EXTREMITIES: No peripheral edema, warm to touch, +2 bilateral DP and PT pulses HEMATOLOGIC: No significant bruising NEUROLOGIC: Oriented to person, place, and time. Nonfocal. Normal muscle tone.  PSYCHIATRIC: Normal mood and affect. Normal behavior. Cooperative  CARDIAC DATABASE: EKG: 05/01/2022: Normal sinus rhythm, 70 bpm, consider old inferior infarct, without underlying ischemia injury pattern.  Echocardiogram: No results found for this or any previous visit from the past 1095 days.    Stress Testing: No results found for this or any previous visit from the past 1095 days.   Heart Catheterization: None  Carotid artery duplex 04/19/2022:  Duplex suggests stenosis in the right internal carotid artery (1-15%).  Duplex suggests stenosis in the left internal carotid artery (50-69%).  Antegrade right vertebral artery flow. Antegrade left vertebral artery flow.  No significant change since 04/09/2017. Follow up in six months is appropriate if clinically indicated.  LABORATORY DATA:  External Labs: Collected: 06/22/2021 provided by referring physician. Sodium 144, potassium 4.3, chloride 107, bicarb 29 bun 16, Cr 0.95 Alk Phos 73, ALT 17, AST  14 A1c 5.8. Total cholesterol 222, triglyceride 137, HDL 66, LDL 125, non-HDL 156 TSH 6.55 Hemoglobin 13.3 g/dL, hematocrit 11.9%   IMPRESSION:    ICD-10-CM   1. Precordial pain  R07.2 PCV CARDIAC STRESS TEST    PCV ECHOCARDIOGRAM COMPLETE    2. Benign hypertension  I10 EKG 12-Lead    3. Atherosclerosis of aorta (HCC)  I70.0     4. Bilateral carotid artery stenosis  I65.23 PCV CAROTID DUPLEX (BILATERAL)    aspirin EC 81 MG tablet       RECOMMENDATIONS: Jenny Glass is a 71 y.o. Caucasian female whose past medical history and cardiac risk factors include: Polymyalgia rheumatica, carotid artery stenosis left greater than right, hepatic hemangiomas, mixed hyperlipidemia, thoracic aortic atherosclerosis.   Precordial pain Symptoms are predominantly noncardiac. However given her risk factors would recommend at least an echocardiogram to evaluate for LVEF and structural heart disease and exercise treadmill stress test to evaluate for exercise-induced ischemia and functional status.  However, patient's husband states that since he is asymptomatic they would like to hold off on testing.  This shared decision was to order the testing and if the symptoms were to arise or if they change their mind I would highly recommend proceeding forward.  Educated the importance of improving her modifiable cardiovascular risk factors.  Benign hypertension Recently started on both losartan and amlodipine by PCP. Her home systolic blood pressures now range between 120-140 mmHg. I have asked her to take blood pressures twice a day and to keep a log. Reemphasized importance of low-salt diet. I did not uptitrate antihypertensive medications as she is complaining of lightheadedness at times.  Atherosclerosis of aorta (HCC) Already on statin therapy. Recommend aspirin 81 mg p.o. daily. Ischemic work-up as outlined above.  Bilateral carotid artery stenosis Asymptomatic.  We will repeat carotid duplex in  6 months to reevaluate disease progression. Continue statin therapy. We will recommend a goal LDL of at least less than 70 mg/dL. And will start aspirin 81 mg p.o. daily Patient is asked to seek medical attention if she has vision changes or focal neurological deficits.  Both husband and patient verbalized understanding.   FINAL MEDICATION LIST END OF ENCOUNTER: Meds ordered this encounter  Medications   aspirin EC 81 MG tablet    Sig: Take 1 tablet (81 mg total) by mouth daily. Swallow whole.    Dispense:  30 tablet    Refill:  12    Medications Discontinued During This Encounter  Medication Reason   diclofenac (VOLTAREN) 75 MG EC tablet    gabapentin (NEURONTIN) 300 MG capsule    ibuprofen (ADVIL,MOTRIN) 200 MG tablet    methocarbamol (ROBAXIN) 500 MG tablet    oxyCODONE-acetaminophen (PERCOCET) 10-325 MG tablet    oxyCODONE-acetaminophen (PERCOCET) 5-325 MG tablet    zoster vaccine live, PF, (ZOSTAVAX) 01027 UNT/0.65ML injection      Current Outpatient Medications:    amLODipine (NORVASC) 5 MG tablet, Take 5 mg by mouth every morning., Disp: , Rfl:    aspirin EC 81 MG tablet, Take 1 tablet (81 mg total) by mouth daily. Swallow whole., Disp: 30 tablet, Rfl: 12   Calcium Carb-Cholecalciferol (CALCIUM + D3 PO), Take 1 tablet by mouth daily. Reported on 05/24/2016, Disp: , Rfl:    losartan (COZAAR) 50 MG tablet, Take 50 mg by mouth daily at 10 pm., Disp: , Rfl:    Methotrexate Sodium (METHOTREXATE, PF,) 50 MG/2ML injection, Inject into the muscle once a week., Disp: , Rfl:    NON FORMULARY, Infusion/simponi once every 6 weeks, Disp: , Rfl:    predniSONE (DELTASONE) 1 MG tablet, Take 2 mg by mouth daily., Disp: , Rfl:    rosuvastatin (CRESTOR) 10 MG tablet, Take 10 mg by mouth daily., Disp: , Rfl:   Orders Placed This Encounter  Procedures   PCV CARDIAC STRESS TEST   EKG 12-Lead   PCV ECHOCARDIOGRAM COMPLETE   PCV CAROTID DUPLEX (BILATERAL)    There are no Patient  Instructions on file for this visit.   --Continue cardiac medications as reconciled in final medication list. --Return in about 6 weeks (around 06/12/2022) for Reevaluation of, Carotid disease, Review test results. or sooner if needed. --Continue follow-up with your primary care physician regarding the management of your other chronic comorbid conditions.  Patient's questions and concerns were addressed to her satisfaction. She voices understanding of the instructions provided during this encounter.   This note was created using a voice recognition software as a result there may be grammatical errors inadvertently enclosed that do not reflect the nature of this encounter. Every attempt is made to correct such errors.  Tessa Lerner, [place], [facility]  Pager: [phone] Office: [phone]

## 2022-05-17 ENCOUNTER — Ambulatory Visit: Payer: Medicare Other

## 2022-05-17 DIAGNOSIS — R072 Precordial pain: Secondary | ICD-10-CM

## 2022-05-21 DIAGNOSIS — M0609 Rheumatoid arthritis without rheumatoid factor, multiple sites: Secondary | ICD-10-CM | POA: Diagnosis not present

## 2022-05-21 NOTE — Progress Notes (Signed)
Called, NA, left a VM requesting a call back.

## 2022-05-21 NOTE — Progress Notes (Signed)
Spoke to pt and she is aware of test results.

## 2022-05-23 ENCOUNTER — Ambulatory Visit: Payer: Medicare Other

## 2022-05-23 DIAGNOSIS — R072 Precordial pain: Secondary | ICD-10-CM

## 2022-05-28 NOTE — Progress Notes (Signed)
Called pt to inform her about her echo result. Pt understood.

## 2022-06-12 ENCOUNTER — Ambulatory Visit: Payer: Medicare Other | Admitting: Cardiology

## 2022-06-20 ENCOUNTER — Ambulatory Visit: Payer: Medicare Other | Admitting: Cardiology

## 2022-06-20 ENCOUNTER — Encounter: Payer: Self-pay | Admitting: Cardiology

## 2022-06-20 VITALS — BP 131/80 | HR 63 | Resp 16 | Ht 63.0 in | Wt 164.0 lb

## 2022-06-20 DIAGNOSIS — R072 Precordial pain: Secondary | ICD-10-CM | POA: Diagnosis not present

## 2022-06-20 DIAGNOSIS — I700 Atherosclerosis of aorta: Secondary | ICD-10-CM | POA: Diagnosis not present

## 2022-06-20 DIAGNOSIS — I6523 Occlusion and stenosis of bilateral carotid arteries: Secondary | ICD-10-CM | POA: Diagnosis not present

## 2022-06-20 DIAGNOSIS — I10 Essential (primary) hypertension: Secondary | ICD-10-CM

## 2022-06-20 NOTE — Progress Notes (Signed)
ID:  Jenny Glass, DOB Mar 10, 1951, MRN [id number]  PCP:  Merrilee Seashore, MD  Cardiologist:  Rex Kras, DO, [facility] (established care 05/01/2022)  Date: 06/20/22 Last Office Visit: 05/01/2022  Chief Complaint  Patient presents with   Follow-up    Reevaluation of chest pain and discuss test results    HPI  Jenny Glass is a 71 y.o. Caucasian female whose past medical history and cardiovascular risk factors include: Polymyalgia rheumatica, carotid artery stenosis left greater than right, hepatic hemangiomas, mixed hyperlipidemia, thoracic aortic atherosclerosis.   Patient was referred to the practice for evaluation of hypertension and chest pain.  Her precordial pain predominantly was consistent with noncardiac etiology; however, given her risk factors she did undergo an exercise treadmill stress test and echocardiogram.  Results reviewed with her in great detail and noted below for further reference.  With regards to benign essential hypertension patient's home blood pressures were ranging between 120s-140 mmHg.  At recommended her to reduce salt intake on a daily basis and increase physical activity as tolerated.  Plan to keep a log of blood pressures at home and to bring it in at the next office visit.  Since last office visit, denies anginal discomfort or heart failure symptoms.  Blood pressure still remain relatively stable between 120-140 mmHg.  FUNCTIONAL STATUS: No structured exercise program or daily routine.   ALLERGIES: No Known Allergies  MEDICATION LIST PRIOR TO VISIT: Current Meds  Medication Sig   amLODipine (NORVASC) 5 MG tablet Take 5 mg by mouth every morning.   aspirin EC 81 MG tablet Take 1 tablet (81 mg total) by mouth daily. Swallow whole.   losartan (COZAAR) 50 MG tablet Take 50 mg by mouth daily at 10 pm.   Methotrexate Sodium (METHOTREXATE, PF,) 50 MG/2ML injection Inject into the muscle once a week.   NON FORMULARY Infusion/simponi once every 6  weeks   predniSONE (DELTASONE) 1 MG tablet Take 2 mg by mouth daily.   rosuvastatin (CRESTOR) 10 MG tablet Take 10 mg by mouth daily.     PAST MEDICAL HISTORY: Past Medical History:  Diagnosis Date   Carotid artery stenosis    Chronic back pain    Diverticulitis    Hyperlipidemia    Hypertension    Numbness and tingling    hands bilat and lower legs bilat comes and goes    UTI (lower urinary tract infection)     PAST SURGICAL HISTORY: Past Surgical History:  Procedure Laterality Date   SHOULDER OPEN ROTATOR CUFF REPAIR Left 11/10/2015   Procedure: LEFT SHOULDER MINI OPEN ROTATOR CUFF REPAIR AND DISTAL CLAVICAL RESECTION;  Surgeon: Susa Day, MD;  Location: [facility];  Service: Orthopedics;  Laterality: Left;    FAMILY HISTORY: The patient family history includes Cancer in her father and mother.  SOCIAL HISTORY:  The patient  reports that she has never smoked. She has never used smokeless tobacco. She reports that she does not drink alcohol and does not use drugs.  REVIEW OF SYSTEMS: Review of Systems  Cardiovascular:  Negative for chest pain, dyspnea on exertion, leg swelling, near-syncope, orthopnea, palpitations, paroxysmal nocturnal dyspnea and syncope.  Neurological:  Positive for headaches (improved).    PHYSICAL EXAM:    06/20/2022   11:31 AM 05/01/2022    9:26 AM 01/10/2019   11:15 PM  Vitals with BMI  Height '5\' 3"'  '5\' 3"'    Weight 164 lbs 166 lbs 3 oz   BMI 25.05 39.76   Systolic 734 193  144  Diastolic 80 80 80  Pulse 63 71 59    CONSTITUTIONAL: Well-developed and well-nourished. No acute distress.  SKIN: Skin is warm and dry. No rash noted. No cyanosis. No pallor. No jaundice HEAD: Normocephalic and atraumatic.  EYES: No scleral icterus MOUTH/THROAT: Moist oral membranes.  NECK: No JVD present. No thyromegaly noted. No carotid bruits  CHEST Normal respiratory effort. No intercostal retractions  LUNGS: Clear to auscultation bilaterally.  No stridor. No  wheezes. No rales.  CARDIOVASCULAR: Regular rate and rhythm, positive S1-S2, no murmurs rubs or gallops appreciated. ABDOMINAL: Soft, nontender, nondistended, positive bowel sounds all 4 quadrants. No apparent ascites.  EXTREMITIES: No peripheral edema, warm to touch, +2 bilateral DP and PT pulses HEMATOLOGIC: No significant bruising NEUROLOGIC: Oriented to person, place, and time. Nonfocal. Normal muscle tone.  PSYCHIATRIC: Normal mood and affect. Normal behavior. Cooperative  CARDIAC DATABASE: EKG: 05/01/2022: Normal sinus rhythm, 70 bpm, consider old inferior infarct, without underlying ischemia injury pattern.  Echocardiogram: 05/23/2022: Normal LV systolic function with visual EF 60-65%. Left ventricle cavity is normal in size. Normal left ventricular wall thickness. Normal global wall motion. Normal diastolic filling pattern, normal LAP.  Mild (Grade I) aortic regurgitation. Mild tricuspid regurgitation. No evidence of pulmonary hypertension. RVSP measures 32 mmHg. The aortic root is normal. Proximal ascending aorta upper limit of normal, 37 mm. No prior study for comparison.   Stress Testing: Exercise treadmill stress test 05/17/2022: Exercise treadmill stress test performed using Bruce protocol. Patient reached 8 METS, and 89% of age predicted maximum heart rate. Exercise capacity was fair. No chest pain reported. Normal heart rate and hemodynamic response. Stress EKG revealed no ischemic changes. Low risk study.  Heart Catheterization: None  Carotid artery duplex 04/19/2022:  Duplex suggests stenosis in the right internal carotid artery (1-15%).  Duplex suggests stenosis in the left internal carotid artery (50-69%).  Antegrade right vertebral artery flow. Antegrade left vertebral artery flow.  No significant change since 04/09/2017. Follow up in six months is appropriate if clinically indicated.  LABORATORY DATA:  External Labs: Collected: 06/22/2021 provided by referring  physician. Sodium 144, potassium 4.3, chloride 107, bicarb 29 bun 16, Cr 0.95 Alk Phos 73, ALT 17, AST 14 A1c 5.8. Total cholesterol 222, triglyceride 137, HDL 66, LDL 125, non-HDL 156 TSH 6.55 Hemoglobin 13.3 g/dL, hematocrit 41.6%   IMPRESSION:    ICD-10-CM   1. Precordial pain  R07.2     2. Benign hypertension  I10     3. Atherosclerosis of aorta (HCC)  I70.0     4. Bilateral carotid artery stenosis  I65.23        RECOMMENDATIONS: Jenny Glass is a 71 y.o. Caucasian female whose past medical history and cardiac risk factors include: Polymyalgia rheumatica, carotid artery stenosis left greater than right, hepatic hemangiomas, mixed hyperlipidemia, thoracic aortic atherosclerosis.   Precordial pain Resolved since last office visit. Reviewed the results of the echo and stress test with both the patient and her husband at today's office visit. Educated on the importance of improving her modifiable cardiovascular risk factors. No further testing warranted at this time.  Benign hypertension Home blood pressures are well controlled between 120-140 mmHg. Current medical therapy reviewed. She has this nonspecific cough, I advised her to discuss it further with PCP and if needed could trial off of losartan to another antihypertensive medication. We emphasized the importance of a low-salt diet. Increase physical activity as tolerated with a goal of 30 minutes a day 5 days a week.  Atherosclerosis of aorta (HCC) Continue aspirin and statin therapy.  Bilateral carotid artery stenosis Asymptomatic Has a repeat carotid duplex scheduled for December 2023. Continue aspirin and statin therapy. Patient is asked to seek medical attention if she has vision changes or focal neurological deficits.  Patient is scheduled to have a annual well visit in September 2023.  I have asked her to provide a copy of her labs for reference.  Discussed management of at least 2 chronic comorbid  conditions, reviewed medical therapy, discussed plan of care and management long-term, and plan of care also discussed with the patient's husband at bedside.  FINAL MEDICATION LIST END OF ENCOUNTER: No orders of the defined types were placed in this encounter.   Medications Discontinued During This Encounter  Medication Reason   Calcium Carb-Cholecalciferol (CALCIUM + D3 PO)      Current Outpatient Medications:    amLODipine (NORVASC) 5 MG tablet, Take 5 mg by mouth every morning., Disp: , Rfl:    aspirin EC 81 MG tablet, Take 1 tablet (81 mg total) by mouth daily. Swallow whole., Disp: 30 tablet, Rfl: 12   losartan (COZAAR) 50 MG tablet, Take 50 mg by mouth daily at 10 pm., Disp: , Rfl:    Methotrexate Sodium (METHOTREXATE, PF,) 50 MG/2ML injection, Inject into the muscle once a week., Disp: , Rfl:    NON FORMULARY, Infusion/simponi once every 6 weeks, Disp: , Rfl:    predniSONE (DELTASONE) 1 MG tablet, Take 2 mg by mouth daily., Disp: , Rfl:    rosuvastatin (CRESTOR) 10 MG tablet, Take 10 mg by mouth daily., Disp: , Rfl:   No orders of the defined types were placed in this encounter.   There are no Patient Instructions on file for this visit.   --Continue cardiac medications as reconciled in final medication list. --Return in about 5 months (around 11/20/2022) for Follow up carotid disease. or sooner if needed. --Continue follow-up with your primary care physician regarding the management of your other chronic comorbid conditions.  Patient's questions and concerns were addressed to her satisfaction. She voices understanding of the instructions provided during this encounter.   This note was created using a voice recognition software as a result there may be grammatical errors inadvertently enclosed that do not reflect the nature of this encounter. Every attempt is made to correct such errors.  Total time spent: 20 minutes  Mechele Claude [facility]  Pager: [phone] Office:  [phone]

## 2022-07-11 ENCOUNTER — Emergency Department (HOSPITAL_COMMUNITY): Payer: Medicare Other

## 2022-07-11 ENCOUNTER — Inpatient Hospital Stay (HOSPITAL_COMMUNITY)
Admission: EM | Admit: 2022-07-11 | Discharge: 2022-07-27 | DRG: 392 | Disposition: A | Discharge status: Home / Self Care | Payer: Medicare Other | Attending: Family Medicine | Admitting: Family Medicine

## 2022-07-11 ENCOUNTER — Other Ambulatory Visit: Payer: Self-pay

## 2022-07-11 ENCOUNTER — Encounter (HOSPITAL_COMMUNITY): Payer: Self-pay

## 2022-07-11 DIAGNOSIS — I700 Atherosclerosis of aorta: Secondary | ICD-10-CM | POA: Diagnosis not present

## 2022-07-11 DIAGNOSIS — I739 Peripheral vascular disease, unspecified: Secondary | ICD-10-CM | POA: Diagnosis not present

## 2022-07-11 DIAGNOSIS — R059 Cough, unspecified: Secondary | ICD-10-CM | POA: Diagnosis not present

## 2022-07-11 DIAGNOSIS — Z79899 Other long term (current) drug therapy: Secondary | ICD-10-CM

## 2022-07-11 DIAGNOSIS — Z221 Carrier of other intestinal infectious diseases: Secondary | ICD-10-CM

## 2022-07-11 DIAGNOSIS — E440 Moderate protein-calorie malnutrition: Secondary | ICD-10-CM | POA: Insufficient documentation

## 2022-07-11 DIAGNOSIS — Z809 Family history of malignant neoplasm, unspecified: Secondary | ICD-10-CM

## 2022-07-11 DIAGNOSIS — I10 Essential (primary) hypertension: Secondary | ICD-10-CM | POA: Diagnosis not present

## 2022-07-11 DIAGNOSIS — K5792 Diverticulitis of intestine, part unspecified, without perforation or abscess without bleeding: Secondary | ICD-10-CM | POA: Diagnosis not present

## 2022-07-11 DIAGNOSIS — K5730 Diverticulosis of large intestine without perforation or abscess without bleeding: Secondary | ICD-10-CM | POA: Diagnosis not present

## 2022-07-11 DIAGNOSIS — D849 Immunodeficiency, unspecified: Secondary | ICD-10-CM | POA: Diagnosis present

## 2022-07-11 DIAGNOSIS — M353 Polymyalgia rheumatica: Secondary | ICD-10-CM | POA: Diagnosis not present

## 2022-07-11 DIAGNOSIS — N321 Vesicointestinal fistula: Secondary | ICD-10-CM | POA: Diagnosis present

## 2022-07-11 DIAGNOSIS — E785 Hyperlipidemia, unspecified: Secondary | ICD-10-CM

## 2022-07-11 DIAGNOSIS — K6389 Other specified diseases of intestine: Secondary | ICD-10-CM | POA: Diagnosis not present

## 2022-07-11 DIAGNOSIS — M549 Dorsalgia, unspecified: Secondary | ICD-10-CM | POA: Diagnosis present

## 2022-07-11 DIAGNOSIS — N390 Urinary tract infection, site not specified: Secondary | ICD-10-CM

## 2022-07-11 DIAGNOSIS — N1330 Unspecified hydronephrosis: Secondary | ICD-10-CM | POA: Diagnosis not present

## 2022-07-11 DIAGNOSIS — K5720 Diverticulitis of large intestine with perforation and abscess without bleeding: Principal | ICD-10-CM | POA: Diagnosis present

## 2022-07-11 DIAGNOSIS — E876 Hypokalemia: Secondary | ICD-10-CM | POA: Diagnosis present

## 2022-07-11 DIAGNOSIS — I6523 Occlusion and stenosis of bilateral carotid arteries: Secondary | ICD-10-CM | POA: Diagnosis present

## 2022-07-11 DIAGNOSIS — I2510 Atherosclerotic heart disease of native coronary artery without angina pectoris: Secondary | ICD-10-CM | POA: Diagnosis present

## 2022-07-11 DIAGNOSIS — I6529 Occlusion and stenosis of unspecified carotid artery: Secondary | ICD-10-CM

## 2022-07-11 DIAGNOSIS — Z7952 Long term (current) use of systemic steroids: Secondary | ICD-10-CM

## 2022-07-11 DIAGNOSIS — Z6828 Body mass index (BMI) 28.0-28.9, adult: Secondary | ICD-10-CM

## 2022-07-11 DIAGNOSIS — R0602 Shortness of breath: Secondary | ICD-10-CM | POA: Diagnosis not present

## 2022-07-11 DIAGNOSIS — K5732 Diverticulitis of large intestine without perforation or abscess without bleeding: Secondary | ICD-10-CM

## 2022-07-11 DIAGNOSIS — R1032 Left lower quadrant pain: Secondary | ICD-10-CM | POA: Diagnosis present

## 2022-07-11 DIAGNOSIS — B961 Klebsiella pneumoniae [K. pneumoniae] as the cause of diseases classified elsewhere: Secondary | ICD-10-CM | POA: Diagnosis present

## 2022-07-11 DIAGNOSIS — K5780 Diverticulitis of intestine, part unspecified, with perforation and abscess without bleeding: Secondary | ICD-10-CM | POA: Diagnosis present

## 2022-07-11 DIAGNOSIS — N134 Hydroureter: Secondary | ICD-10-CM | POA: Diagnosis not present

## 2022-07-11 DIAGNOSIS — G8929 Other chronic pain: Secondary | ICD-10-CM

## 2022-07-11 DIAGNOSIS — Z7982 Long term (current) use of aspirin: Secondary | ICD-10-CM | POA: Diagnosis not present

## 2022-07-11 LAB — COMPREHENSIVE METABOLIC PANEL
ALT: 11 U/L (ref 0–44)
AST: 13 U/L — ABNORMAL LOW (ref 15–41)
Albumin: 3.9 g/dL (ref 3.5–5.0)
Alkaline Phosphatase: 55 U/L (ref 38–126)
Anion gap: 9 (ref 5–15)
BUN: 10 mg/dL (ref 8–23)
CO2: 23 mmol/L (ref 22–32)
Calcium: 9.1 mg/dL (ref 8.9–10.3)
Chloride: 107 mmol/L (ref 98–111)
Creatinine, Ser: 1.08 mg/dL — ABNORMAL HIGH (ref 0.44–1.00)
GFR, Estimated: 56 mL/min — ABNORMAL LOW (ref >60)
Glucose, Bld: 101 mg/dL — ABNORMAL HIGH (ref 70–99)
Potassium: 3.6 mmol/L (ref 3.5–5.1)
Sodium: 139 mmol/L (ref 135–145)
Total Bilirubin: 0.9 mg/dL (ref 0.3–1.2)
Total Protein: 7.3 g/dL (ref 6.5–8.1)

## 2022-07-11 LAB — LACTIC ACID, PLASMA: Lactic Acid, Venous: 1.1 mmol/L (ref 0.5–1.9)

## 2022-07-11 LAB — LIPASE, BLOOD: Lipase: 23 U/L (ref 11–51)

## 2022-07-11 LAB — URINALYSIS, ROUTINE W REFLEX MICROSCOPIC
Bilirubin Urine: NEGATIVE
Glucose, UA: NEGATIVE mg/dL
Ketones, ur: 5 mg/dL — AB
Nitrite: POSITIVE — AB
Protein, ur: NEGATIVE mg/dL
Specific Gravity, Urine: 1.011 (ref 1.005–1.030)
pH: 6 (ref 5.0–8.0)

## 2022-07-11 LAB — CBC
HCT: 39 % (ref 36.0–46.0)
Hemoglobin: 12.6 g/dL (ref 12.0–15.0)
MCH: 30.2 pg (ref 26.0–34.0)
MCHC: 32.3 g/dL (ref 30.0–36.0)
MCV: 93.5 fL (ref 80.0–100.0)
Platelets: 188 10*3/uL (ref 150–400)
RBC: 4.17 MIL/uL (ref 3.87–5.11)
RDW: 15.1 % (ref 11.5–15.5)
WBC: 11.1 10*3/uL — ABNORMAL HIGH (ref 4.0–10.5)
nRBC: 0 % (ref 0.0–0.2)

## 2022-07-11 LAB — HIV ANTIBODY (ROUTINE TESTING W REFLEX): HIV Screen 4th Generation wRfx: NONREACTIVE

## 2022-07-11 MED ORDER — METRONIDAZOLE 500 MG/100ML IV SOLN
500.0000 mg | Freq: Two times a day (BID) | INTRAVENOUS | Status: DC
  Administered 2022-07-11 – 2022-07-12 (×2): 500 mg via INTRAVENOUS
  Filled 2022-07-11 (×2): qty 100

## 2022-07-11 MED ORDER — IOHEXOL 300 MG/ML  SOLN
100.0000 mL | Freq: Once | INTRAMUSCULAR | Status: AC | PRN
  Administered 2022-07-11: 75 mL via INTRAVENOUS

## 2022-07-11 MED ORDER — LACTATED RINGERS IV BOLUS
1000.0000 mL | Freq: Once | INTRAVENOUS | Status: AC
  Administered 2022-07-11: 1000 mL via INTRAVENOUS

## 2022-07-11 MED ORDER — SODIUM CHLORIDE 0.9 % IV SOLN
INTRAVENOUS | Status: AC
  Administered 2022-07-15 – 2022-07-16 (×2): 1000 mL via INTRAVENOUS

## 2022-07-11 MED ORDER — ONDANSETRON HCL 4 MG PO TABS
4.0000 mg | ORAL_TABLET | Freq: Four times a day (QID) | ORAL | Status: DC | PRN
  Administered 2022-07-12 – 2022-07-17 (×3): 4 mg via ORAL
  Filled 2022-07-11 (×3): qty 1

## 2022-07-11 MED ORDER — ACETAMINOPHEN 650 MG RE SUPP: 650.0000 mg | Freq: Four times a day (QID) | RECTAL | Status: DC | PRN

## 2022-07-11 MED ORDER — ONDANSETRON HCL 4 MG/2ML IJ SOLN
4.0000 mg | Freq: Four times a day (QID) | INTRAMUSCULAR | Status: DC | PRN
  Administered 2022-07-19 – 2022-07-21 (×3): 4 mg via INTRAVENOUS
  Filled 2022-07-11 (×3): qty 2

## 2022-07-11 MED ORDER — MORPHINE SULFATE (PF) 2 MG/ML IV SOLN
2.0000 mg | INTRAVENOUS | Status: DC | PRN
  Administered 2022-07-11 – 2022-07-25 (×26): 2 mg via INTRAVENOUS
  Filled 2022-07-11 (×26): qty 1

## 2022-07-11 MED ORDER — TRAMADOL HCL 50 MG PO TABS
50.0000 mg | ORAL_TABLET | Freq: Four times a day (QID) | ORAL | Status: DC | PRN
  Administered 2022-07-11 – 2022-07-26 (×16): 50 mg via ORAL
  Filled 2022-07-11 (×18): qty 1

## 2022-07-11 MED ORDER — SODIUM CHLORIDE 0.9 % IV SOLN: 2.0000 g | INTRAVENOUS | Status: DC

## 2022-07-11 MED ORDER — SODIUM CHLORIDE 0.9 % IV SOLN
1.0000 g | Freq: Once | INTRAVENOUS | Status: AC
  Administered 2022-07-11: 1 g via INTRAVENOUS
  Filled 2022-07-11: qty 10

## 2022-07-11 MED ORDER — ACETAMINOPHEN 325 MG PO TABS
650.0000 mg | ORAL_TABLET | Freq: Four times a day (QID) | ORAL | Status: DC | PRN
  Administered 2022-07-12 – 2022-07-22 (×10): 650 mg via ORAL
  Filled 2022-07-11 (×11): qty 2

## 2022-07-11 MED ORDER — SENNOSIDES-DOCUSATE SODIUM 8.6-50 MG PO TABS: 1.0000 | ORAL_TABLET | Freq: Every evening | ORAL | Status: DC | PRN

## 2022-07-11 MED ORDER — ENOXAPARIN SODIUM 40 MG/0.4ML IJ SOSY
40.0000 mg | PREFILLED_SYRINGE | INTRAMUSCULAR | Status: DC
  Administered 2022-07-11 – 2022-07-19 (×9): 40 mg via SUBCUTANEOUS
  Filled 2022-07-11 (×9): qty 0.4

## 2022-07-11 NOTE — ED Provider Notes (Signed)
[facility] DEPT Provider Note  CSN: [id number] Arrival date & time: 07/11/22 1412  Chief Complaint(s) Abdominal Pain  HPI Jenny Glass is a 71 y.o. female with PMH HTN, HLD who presents emergency department for evaluation of abdominal pain.  Patient states for the last 3 days she has had gradually worsening left lower quadrant and suprapubic abdominal pain with associated nausea.  She denies vomiting, diarrhea, headache or other systemic symptoms.  She was seen at her primary care office today and instructed to come to the emergency department for evaluation.  She endorses chills and possible fever at home.   Past Medical History Past Medical History:  Diagnosis Date   Carotid artery stenosis    Chronic back pain    Diverticulitis    Hyperlipidemia    Hypertension    Numbness and tingling    hands bilat and lower legs bilat comes and goes    UTI (lower urinary tract infection)    Patient Active Problem List   Diagnosis Date Noted   Chronic low back pain 05/25/2016   Rotator cuff tear 11/10/2015   Rupture of rotator cuff of shoulder 11/10/2015   Home Medication(s) Prior to Admission medications   Medication Sig Start Date End Date Taking? Authorizing Provider  amLODipine (NORVASC) 5 MG tablet Take 5 mg by mouth every morning.    [provider]  aspirin EC 81 MG tablet Take 1 tablet (81 mg total) by mouth daily. Swallow whole. 05/01/22   Tolia, Sunit, DO  losartan (COZAAR) 50 MG tablet Take 50 mg by mouth daily at 10 pm. 04/04/22   [provider]  Methotrexate Sodium (METHOTREXATE, PF,) 50 MG/2ML injection Inject into the muscle once a week. 04/12/22   [provider]  NON FORMULARY Infusion/simponi once every 6 weeks    [provider]  predniSONE (DELTASONE) 1 MG tablet Take 2 mg by mouth daily. 04/10/22   [provider]  rosuvastatin (CRESTOR) 10 MG tablet Take 10 mg by mouth daily. 04/04/22   [provider]                                                                                                                                    Past Surgical History Past Surgical History:  Procedure Laterality Date   SHOULDER OPEN ROTATOR CUFF REPAIR Left 11/10/2015   Procedure: LEFT SHOULDER MINI OPEN ROTATOR CUFF REPAIR AND DISTAL CLAVICAL RESECTION;  Surgeon: Susa Day, MD;  Location: [facility];  Service: Orthopedics;  Laterality: Left;   Family History Family History  Problem Relation Age of Onset   Cancer Mother    Cancer Father     Social History Social History   Tobacco Use   Smoking status: Never   Smokeless tobacco: Never  Vaping Use   Vaping Use: Never used  Substance Use Topics   Alcohol use: No   Drug use: No  Allergies Patient has no known allergies.  Review of Systems Review of Systems  Constitutional:  Positive for chills.  Gastrointestinal:  Positive for abdominal pain and nausea.    Physical Exam Vital Signs  I have reviewed the triage vital signs BP 107/77 (BP Location: Left Arm)   Pulse 93   Temp 98.1 F (36.7 C) (Oral)   Resp 16   SpO2 98%   Physical Exam Vitals and nursing note reviewed.  Constitutional:      General: She is not in acute distress.    Appearance: She is well-developed.  HENT:     Head: Normocephalic and atraumatic.  Eyes:     Conjunctiva/sclera: Conjunctivae normal.  Cardiovascular:     Rate and Rhythm: Normal rate and regular rhythm.     Heart sounds: No murmur heard. Pulmonary:     Effort: Pulmonary effort is normal. No respiratory distress.     Breath sounds: Normal breath sounds.  Abdominal:     Palpations: Abdomen is soft.     Tenderness: There is abdominal tenderness in the suprapubic area and left lower quadrant.  Musculoskeletal:        General: No swelling.     Cervical back: Neck supple.  Skin:    General: Skin is warm and dry.     Capillary Refill: Capillary refill takes less than 2 seconds.   Neurological:     Mental Status: She is alert.  Psychiatric:        Mood and Affect: Mood normal.     ED Results and Treatments Labs (all labs ordered are listed, but only abnormal results are displayed) Labs Reviewed  COMPREHENSIVE METABOLIC PANEL - Abnormal; Notable for the following components:      Result Value   Glucose, Bld 101 (*)    Creatinine, Ser 1.08 (*)    AST 13 (*)    GFR, Estimated 56 (*)    All other components within normal limits  CBC - Abnormal; Notable for the following components:   WBC 11.1 (*)    All other components within normal limits  URINALYSIS, ROUTINE W REFLEX MICROSCOPIC - Abnormal; Notable for the following components:   APPearance HAZY (*)    Hgb urine dipstick MODERATE (*)    Ketones, ur 5 (*)    Nitrite POSITIVE (*)    Leukocytes,Ua MODERATE (*)    Bacteria, UA MANY (*)    All other components within normal limits  LIPASE, BLOOD  LACTIC ACID, PLASMA  LACTIC ACID, PLASMA                                                                                                                          Radiology No results found.  Pertinent labs & imaging results that were available during my care of the patient were reviewed by me and considered in my medical decision making (see MDM for details).  Medications Ordered in ED Medications  cefTRIAXone (ROCEPHIN) 1 g in sodium chloride 0.9 %  100 mL IVPB (has no administration in time range)  lactated ringers bolus 1,000 mL (1,000 mLs Intravenous New Bag/Given 07/11/22 1537)                                                                                                                                     Procedures Procedures  (including critical care time)  Medical Decision Making / ED Course   This patient presents to the ED for concern of abdominal pain, nausea, vomiting, this involves an extensive number of treatment options, and is a complaint that carries with it a high risk of complications  and morbidity.  The differential diagnosis includes cystitis, diverticulitis, gastritis, intra-abdominal infection  MDM: Patient seen emergency room for evaluation of abdominal pain nausea and vomiting.  Physical exam with suprapubic and left lower quadrant tenderness to palpation.  Laboratory evaluation with a leukocytosis to 11.1, creatinine 1.08 but is otherwise unremarkable.  Urinalysis with positive nitrites, moderate leuk esterase, 21-50 white blood cells and many bacteria concerning for UTI.  CT abdomen pelvis concerning for diverticulitis with a small 1.9 cm fluid collection.  I spoke briefly with surgery at the request of the hospitalist and spoke with Dr. Donne Hazel who states that there is no surgical indication at this time.  Patient given ceftriaxone Flagyl and admitted to the hospital service for difficulty tolerating p.o. in the setting of 2 concomitant intra-abdominal infections.   Additional history obtained: -Additional history obtained from husband -External records from outside source obtained and reviewed including: Chart review including previous notes, labs, imaging, consultation notes   Lab Tests: -I ordered, reviewed, and interpreted labs.   The pertinent results include:   Labs Reviewed  COMPREHENSIVE METABOLIC PANEL - Abnormal; Notable for the following components:      Result Value   Glucose, Bld 101 (*)    Creatinine, Ser 1.08 (*)    AST 13 (*)    GFR, Estimated 56 (*)    All other components within normal limits  CBC - Abnormal; Notable for the following components:   WBC 11.1 (*)    All other components within normal limits  URINALYSIS, ROUTINE W REFLEX MICROSCOPIC - Abnormal; Notable for the following components:   APPearance HAZY (*)    Hgb urine dipstick MODERATE (*)    Ketones, ur 5 (*)    Nitrite POSITIVE (*)    Leukocytes,Ua MODERATE (*)    Bacteria, UA MANY (*)    All other components within normal limits  LIPASE, BLOOD  LACTIC ACID, PLASMA   LACTIC ACID, PLASMA    Imaging Studies ordered: I ordered imaging studies including CT abdomen pelvis I independently visualized and interpreted imaging. I agree with the radiologist interpretation   Medicines ordered and prescription drug management: Meds ordered this encounter  Medications   lactated ringers bolus 1,000 mL   cefTRIAXone (ROCEPHIN) 1 g in sodium chloride 0.9 % 100 mL IVPB  Order Specific Question:   Antibiotic Indication:    Answer:   UTI    -I have reviewed the patients home medicines and have made adjustments as needed  Critical interventions none  Consultations Obtained: I requested consultation with the general surgeon Dr. Donne Hazel,  and discussed lab and imaging findings as well as pertinent plan - they recommend: Medical admission   Cardiac Monitoring: The patient was maintained on a cardiac monitor.  I personally viewed and interpreted the cardiac monitored which showed an underlying rhythm of: NSR  Social Determinants of Health:  Factors impacting patients care include: none   Reevaluation: After the interventions noted above, I reevaluated the patient and found that they have :improved  Co morbidities that complicate the patient evaluation  Past Medical History:  Diagnosis Date   Carotid artery stenosis    Chronic back pain    Diverticulitis    Hyperlipidemia    Hypertension    Numbness and tingling    hands bilat and lower legs bilat comes and goes    UTI (lower urinary tract infection)       Dispostion: I considered admission for this patient, and due to diverticulitis and associated UTI, patient requires hospital admission     Final Clinical Impression(s) / ED Diagnoses Final diagnoses:  None     '@PCDICTATION'$ @    Teressa Lower, MD 07/11/22 1846

## 2022-07-11 NOTE — ED Notes (Signed)
Patient transported to CT 

## 2022-07-11 NOTE — ED Triage Notes (Signed)
Pt arrived via POV, c/o abd pain and nausea. States intermittent fevers at home. States was seen in office today and told to come to ED

## 2022-07-11 NOTE — Hospital Course (Addendum)
Jenny Glass is a 71 y.o. F with PMR on prednisone, HGN, HLD, and chronic back pain who presented with LLQ and nausea/chills.    In the ER, CT showed 2cm abscess and sigmoid diverticulitis.   Admitted on antibiotics.

## 2022-07-11 NOTE — H&P (Signed)
History and Physical    Jenny Glass QAS:[id number] DOB: [birth year] DOA: 07/11/2022  PCP: Merrilee Seashore, MD  Patient coming from: PCP Chief Complaint  Patient presents with   Abdominal Pain   HPI:71 year old female with history of polymyalgia rheumatica, carotid artery stenosis left greater than right, hepatic hemangioma, hyperlipidemia, aortic atherosclerosis, hypertension, chronic back pain presented to the ED with abdominal pain left lower quadrant and suprapubic with nausea x3 days with associated chills, possible fever at home but no vomiting or diarrhea.  Seen at PCP office and instructed to come to the ED. In the ED vitals stable, underwent lab work that showed mild leukocytosis abnormal UA, CT abdomen pelvis with acute sigmoid colon diverticulitis small peridiverticular abscess/fluid collection 1.9 cm.  Patient was given LR 1 L, Rocephin 1 g, Flagyl, lactic acid ordered and admission requested  On my exam In ED she appears to be in some pain on left to lower abdomen, husband at bedside Patient otherwise denies any vomiting, chest pain, shortness of breath,headache, focal weakness, numbness tingling, speech difficulties.  Assessment/Plan  Abscess of sigmoid colon due to diverticulitis: CT scan shows 1.9 cm abscess/fluid collection with acute sigmoid: Diverticulitis, continue ceftriaxone Flagyl clear liquid diet surgery has been consulted for further recommendation.  Continue pain control stool softener IV fluid hydration. Her last colonoscopy abt 8hr ago and was normal per her.  UTI: Continue ceftriaxone as above follow-up urine culture.   PMR on simponi inj q6-8 wk, methotrexate inj q wkly, and prednisone:resume home meds once med rec completed. Patient is immunocompromised status given chronic steroid use  Hyperlipidemia Hypertension Carotid artery stenosis: Appears stable resume home meds after med rec. On losartan 50 mg, crestor 10 mg,  Chronic back pain: New pain  control.  There is no height or weight on file to calculate BMI.   Severity of Illness: The appropriate patient status for this patient is OBSERVATION. Observation status is judged to be reasonable and necessary in order to provide the required intensity of service to ensure the patient's safety. The patient's presenting symptoms, physical exam findings, and initial radiographic and laboratory data in the context of their medical condition is felt to place them at decreased risk for further clinical deterioration. Furthermore, it is anticipated that the patient will be medically stable for discharge from the hospital within 2 midnights of admission.    DVT prophylaxis: enoxaparin (LOVENOX) injection 40 mg Start: 07/11/22 2200 Code Status:   Code Status: Full Code  Family Communication: Admission, patients condition and plan of care including tests being ordered have been discussed with the patient who indicate understanding and agree with the plan and Code Status.  Consults called:  General surgery  Review of Systems: All systems were reviewed and were negative except as mentioned in HPI above. Negative for fever Negative for chest pain Negative for shortness of breath  Past Medical History:  Diagnosis Date   Carotid artery stenosis    Chronic back pain    Diverticulitis    Hyperlipidemia    Hypertension    Numbness and tingling    hands bilat and lower legs bilat comes and goes    UTI (lower urinary tract infection)     Past Surgical History:  Procedure Laterality Date   SHOULDER OPEN ROTATOR CUFF REPAIR Left 11/10/2015   Procedure: LEFT SHOULDER MINI OPEN ROTATOR CUFF REPAIR AND DISTAL CLAVICAL RESECTION;  Surgeon: Susa Day, MD;  Location: [facility];  Service: Orthopedics;  Laterality: Left;     reports  that she has never smoked. She has never used smokeless tobacco. She reports that she does not drink alcohol and does not use drugs.  No Known Allergies  Family History   Problem Relation Age of Onset   Cancer Mother    Cancer Father      Prior to Admission medications   Medication Sig Start Date End Date Taking? Authorizing Provider  amLODipine (NORVASC) 5 MG tablet Take 5 mg by mouth every morning.    [provider]  aspirin EC 81 MG tablet Take 1 tablet (81 mg total) by mouth daily. Swallow whole. 05/01/22   Tolia, Sunit, DO  losartan (COZAAR) 50 MG tablet Take 50 mg by mouth daily at 10 pm. 04/04/22   [provider]  Methotrexate Sodium (METHOTREXATE, PF,) 50 MG/2ML injection Inject into the muscle once a week. 04/12/22   [provider]  NON FORMULARY Infusion/simponi once every 6 weeks    [provider]  predniSONE (DELTASONE) 1 MG tablet Take 2 mg by mouth daily. 04/10/22   [provider]  rosuvastatin (CRESTOR) 10 MG tablet Take 10 mg by mouth daily. 04/04/22   [provider]    Physical Exam: Vitals:   07/11/22 1421 07/11/22 1722  BP: 107/77 110/68  Pulse: 93 85  Resp: 16 17  Temp: 98.1 F (36.7 C) 98.3 F (36.8 C)  TempSrc: Oral Oral  SpO2: 98% 99%    General exam: AAOx3,NAD,weak appearing. HEENT:Oral mucosa moist, Ear/Nose WNL grossly, dentition normal. Respiratory system: bilaterally clear ,no wheezing or crackles,no use of accessory muscle Cardiovascular system: S1 & S2 +, No JVD,. Gastrointestinal system: Abdomen soft, tender on LLQ,BS+ Nervous System:Alert, awake, moving extremities and grossly nonfocal Extremities: No edema, distal peripheral pulses palpable.  Skin: No rashes,no icterus. MSK: Normal muscle bulk,tone, power   Labs on Admission: I have personally reviewed following labs and imaging studies  CBC: Recent Labs  Lab 07/11/22 1446  WBC 11.1*  HGB 12.6  HCT 39.0  MCV 93.5  PLT 474   Basic Metabolic Panel: Recent Labs  Lab 07/11/22 1446  NA 139  K 3.6  CL 107  CO2 23  GLUCOSE 101*  BUN 10  CREATININE 1.08*  CALCIUM 9.1   GFR: CrCl cannot be  calculated (Unknown ideal weight.). Liver Function Tests: Recent Labs  Lab 07/11/22 1446  AST 13*  ALT 11  ALKPHOS 55  BILITOT 0.9  PROT 7.3  ALBUMIN 3.9   Recent Labs  Lab 07/11/22 1446  LIPASE 23   No results for input(s): "AMMONIA" in the last 168 hours. Coagulation Profile: No results for input(s): "INR", "PROTIME" in the last 168 hours. Cardiac Enzymes: No results for input(s): "CKTOTAL", "CKMB", "CKMBINDEX", "TROPONINI" in the last 168 hours. BNP (last 3 results) No results for input(s): "PROBNP" in the last 8760 hours. HbA1C: No results for input(s): "HGBA1C" in the last 72 hours. CBG: No results for input(s): "GLUCAP" in the last 168 hours. Lipid Profile: No results for input(s): "CHOL", "HDL", "LDLCALC", "TRIG", "CHOLHDL", "LDLDIRECT" in the last 72 hours. Thyroid Function Tests: No results for input(s): "TSH", "T4TOTAL", "FREET4", "T3FREE", "THYROIDAB" in the last 72 hours. Anemia Panel: No results for input(s): "VITAMINB12", "FOLATE", "FERRITIN", "TIBC", "IRON", "RETICCTPCT" in the last 72 hours. Urine analysis:    Component Value Date/Time   COLORURINE YELLOW 07/11/2022 1446   APPEARANCEUR HAZY (A) 07/11/2022 1446   LABSPEC 1.011 07/11/2022 1446   PHURINE 6.0 07/11/2022 1446   GLUCOSEU NEGATIVE 07/11/2022 1446   HGBUR  MODERATE (A) 07/11/2022 1446   BILIRUBINUR NEGATIVE 07/11/2022 1446   BILIRUBINUR negative 10/25/2015 1253   BILIRUBINUR neg 06/18/2013 1550   KETONESUR 5 (A) 07/11/2022 1446   PROTEINUR NEGATIVE 07/11/2022 1446   UROBILINOGEN 0.2 10/25/2015 1253   UROBILINOGEN 0.2 06/18/2013 1811   NITRITE POSITIVE (A) 07/11/2022 1446   LEUKOCYTESUR MODERATE (A) 07/11/2022 1446    Radiological Exams on Admission: CT ABDOMEN PELVIS W CONTRAST  Result Date: 07/11/2022 CLINICAL DATA:  Left lower quadrant abdominal pain. EXAM: CT ABDOMEN AND PELVIS WITH CONTRAST TECHNIQUE: Multidetector CT imaging of the abdomen and pelvis was performed using the standard  protocol following bolus administration of intravenous contrast. RADIATION DOSE REDUCTION: This exam was performed according to the departmental dose-optimization program which includes automated exposure control, adjustment of the mA and/or kV according to patient size and/or use of iterative reconstruction technique. CONTRAST:  70m OMNIPAQUE IOHEXOL 300 MG/ML  SOLN COMPARISON:  01/10/2019. FINDINGS: Lower chest: Chronic scarring at the left lateral lung base. Small hiatal hernia. No acute findings. Hepatobiliary: Liver normal in overall size attenuation. Stable left lobe hemangioma. Subcentimeter low-attenuation lesion, segment 7 consistent with a cyst. No other liver abnormalities. Gallbladder is distended, otherwise unremarkable. No bile duct dilation. Pancreas: Unremarkable. No pancreatic ductal dilatation or surrounding inflammatory changes. Spleen: Normal in size without focal abnormality. Adrenals/Urinary Tract: Normal adrenal glands. Right mid to lower pole renal cortical scarring. 12 mm low-attenuation mass, lower pole the right kidney, stable consistent with a cyst. No follow-up recommended. No renal stones. No hydronephrosis. Ureters are normal in course and in caliber. Bladder is unremarkable. Stomach/Bowel: Mild wall thickening with adjacent inflammatory stranding and haziness along the proximal to mid sigmoid colon consistent with diverticulitis. Small fluid collection lies in the mesentery adjacent to the involved segment of sigmoid colon, 1.9 x 1.2 x 1.5 cm. No extraluminal or free air. Multiple diverticula are noted along the sigmoid colon, less prominently along the descending colon. There are no other areas of colonic inflammation. Stomach unremarkable other than the hiatal hernia. Normal small bowel. Normal appendix visualized. Vascular/Lymphatic: Minor aortic atherosclerosis. No aneurysm. No enlarged lymph nodes. Reproductive: Uterus and bilateral adnexa are unremarkable. Other: No abdominal  wall hernia or abnormality. No abdominopelvic ascites. Musculoskeletal: No fracture or acute finding.  No bone lesion. IMPRESSION: 1. Acute sigmoid colon diverticulitis. Small peridiverticular abscess/fluid collection, 1.9 cm in greatest dimension. No other complicating features. 2. No other acute abnormality within the abdomen or pelvis. Electronically Signed   By: DLajean ManesM.D.   On: 07/11/2022 17:09   \ RAntonieta PertMD [facility]  If 7PM-7AM, please contact night-coverage [email or web address]  07/11/2022, 5:57 PM

## 2022-07-11 NOTE — Consult Note (Signed)
Reason for Consult:ab pain Referring Physician: Dr Humberto Seals is an 71 y.o. female.  HPI: 71 yof with htn, pmr on prednisone who has had csc but cannot remember when presents with ab pain lower abdomen since Monday. Not getting better at home.  Nothing she was trying was helping, says she had a low grade fever. Has been having bms. Some nausea, no emesis. Presented to er and found to have elevated wbc with diverticulitis and small abscess in mesentery. We were asked to see her by medicine  Past Medical History:  Diagnosis Date   Carotid artery stenosis    Chronic back pain    Diverticulitis    Hyperlipidemia    Hypertension    Numbness and tingling    hands bilat and lower legs bilat comes and goes    UTI (lower urinary tract infection)     Past Surgical History:  Procedure Laterality Date   SHOULDER OPEN ROTATOR CUFF REPAIR Left 11/10/2015   Procedure: LEFT SHOULDER MINI OPEN ROTATOR CUFF REPAIR AND DISTAL CLAVICAL RESECTION;  Surgeon: Susa Day, MD;  Location: [facility];  Service: Orthopedics;  Laterality: Left;    Family History  Problem Relation Age of Onset   Cancer Mother    Cancer Father     Social History:  reports that she has never smoked. She has never used smokeless tobacco. She reports that she does not drink alcohol and does not use drugs.  Allergies: No Known Allergies  Medications: I have reviewed the patient's current medications. No current facility-administered medications on file prior to encounter.   Current Outpatient Medications on File Prior to Encounter  Medication Sig Dispense Refill   Methotrexate Sodium (METHOTREXATE, PF,) 50 MG/2ML injection Inject 20 mg into the muscle every Friday.     amLODipine (NORVASC) 5 MG tablet Take 5 mg by mouth every morning.     aspirin EC 81 MG tablet Take 1 tablet (81 mg total) by mouth daily. Swallow whole. 30 tablet 12   losartan (COZAAR) 50 MG tablet Take 50 mg by mouth daily at 10 pm.     NON FORMULARY  Infusion/simponi once every 6 weeks     predniSONE (DELTASONE) 1 MG tablet Take 2 mg by mouth daily.     rosuvastatin (CRESTOR) 10 MG tablet Take 10 mg by mouth daily.      Results for orders placed or performed during the hospital encounter of 07/11/22 (from the past 48 hour(s))  Lipase, blood     Status: None   Collection Time: 07/11/22  2:46 PM  Result Value Ref Range   Lipase 23 11 - 51 U/L    Comment: Performed at [facility], [place] [address], [place], [place] [address]  Comprehensive metabolic panel     Status: Abnormal   Collection Time: 07/11/22  2:46 PM  Result Value Ref Range   Sodium 139 135 - 145 mmol/L   Potassium 3.6 3.5 - 5.1 mmol/L   Chloride 107 98 - 111 mmol/L   CO2 23 22 - 32 mmol/L   Glucose, Bld 101 (H) 70 - 99 mg/dL    Comment: Glucose reference range applies only to samples taken after fasting for at least 8 hours.   BUN 10 8 - 23 mg/dL   Creatinine, Ser 1.08 (H) 0.44 - 1.00 mg/dL   Calcium 9.1 8.9 - 10.3 mg/dL   Total Protein 7.3 6.5 - 8.1 g/dL   Albumin 3.9 3.5 - 5.0 g/dL  AST 13 (L) 15 - 41 U/L   ALT 11 0 - 44 U/L   Alkaline Phosphatase 55 38 - 126 U/L   Total Bilirubin 0.9 0.3 - 1.2 mg/dL   GFR, Estimated 56 (L) >60 mL/min    Comment: (NOTE) Calculated using the CKD-EPI Creatinine Equation (2021)    Anion gap 9 5 - 15    Comment: Performed at [facility], [place] [address] [place], [place] [address]  CBC     Status: Abnormal   Collection Time: 07/11/22  2:46 PM  Result Value Ref Range   WBC 11.1 (H) 4.0 - 10.5 K/uL   RBC 4.17 3.87 - 5.11 MIL/uL   Hemoglobin 12.6 12.0 - 15.0 g/dL   HCT 39.0 36.0 - 46.0 %   MCV 93.5 80.0 - 100.0 fL   MCH 30.2 26.0 - 34.0 pg   MCHC 32.3 30.0 - 36.0 g/dL   RDW 15.1 11.5 - 15.5 %   Platelets 188 150 - 400 K/uL   nRBC 0.0 0.0 - 0.2 %    Comment: Performed at [facility], [place] [address] [place], [place] [address]  Urinalysis, Routine w reflex microscopic  Urine, Clean Catch     Status: Abnormal   Collection Time: 07/11/22  2:46 PM  Result Value Ref Range   Color, Urine YELLOW YELLOW   APPearance HAZY (A) CLEAR   Specific Gravity, Urine 1.011 1.005 - 1.030   pH 6.0 5.0 - 8.0   Glucose, UA NEGATIVE NEGATIVE mg/dL   Hgb urine dipstick MODERATE (A) NEGATIVE   Bilirubin Urine NEGATIVE NEGATIVE   Ketones, ur 5 (A) NEGATIVE mg/dL   Protein, ur NEGATIVE NEGATIVE mg/dL   Nitrite POSITIVE (A) NEGATIVE   Leukocytes,Ua MODERATE (A) NEGATIVE   RBC / HPF 0-5 0 - 5 RBC/hpf   WBC, UA 21-50 0 - 5 WBC/hpf   Bacteria, UA MANY (A) NONE SEEN   Squamous Epithelial / LPF 0-5 0 - 5   Mucus PRESENT     Comment: Performed at [facility], [place] [address] [place], [place] [address]  Lactic acid, plasma     Status: None   Collection Time: 07/11/22  7:29 PM  Result Value Ref Range   Lactic Acid, Venous 1.1 0.5 - 1.9 mmol/L    Comment: Performed at [facility], [place] [address], [place], [place] [address]    CT ABDOMEN PELVIS W CONTRAST  Result Date: 07/11/2022 CLINICAL DATA:  Left lower quadrant abdominal pain. EXAM: CT ABDOMEN AND PELVIS WITH CONTRAST TECHNIQUE: Multidetector CT imaging of the abdomen and pelvis was performed using the standard protocol following bolus administration of intravenous contrast. RADIATION DOSE REDUCTION: This exam was performed according to the departmental dose-optimization program which includes automated exposure control, adjustment of the mA and/or kV according to patient size and/or use of iterative reconstruction technique. CONTRAST:  52m OMNIPAQUE IOHEXOL 300 MG/ML  SOLN COMPARISON:  01/10/2019. FINDINGS: Lower chest: Chronic scarring at the left lateral lung base. Small hiatal hernia. No acute findings. Hepatobiliary: Liver normal in overall size attenuation. Stable left lobe hemangioma. Subcentimeter low-attenuation lesion, segment 7 consistent with a cyst. No other liver abnormalities.  Gallbladder is distended, otherwise unremarkable. No bile duct dilation. Pancreas: Unremarkable. No pancreatic ductal dilatation or surrounding inflammatory changes. Spleen: Normal in size without focal abnormality. Adrenals/Urinary Tract: Normal adrenal glands. Right mid to lower pole renal cortical scarring. 12 mm low-attenuation mass, lower pole the right kidney, stable consistent with a  cyst. No follow-up recommended. No renal stones. No hydronephrosis. Ureters are normal in course and in caliber. Bladder is unremarkable. Stomach/Bowel: Mild wall thickening with adjacent inflammatory stranding and haziness along the proximal to mid sigmoid colon consistent with diverticulitis. Small fluid collection lies in the mesentery adjacent to the involved segment of sigmoid colon, 1.9 x 1.2 x 1.5 cm. No extraluminal or free air. Multiple diverticula are noted along the sigmoid colon, less prominently along the descending colon. There are no other areas of colonic inflammation. Stomach unremarkable other than the hiatal hernia. Normal small bowel. Normal appendix visualized. Vascular/Lymphatic: Minor aortic atherosclerosis. No aneurysm. No enlarged lymph nodes. Reproductive: Uterus and bilateral adnexa are unremarkable. Other: No abdominal wall hernia or abnormality. No abdominopelvic ascites. Musculoskeletal: No fracture or acute finding.  No bone lesion. IMPRESSION: 1. Acute sigmoid colon diverticulitis. Small peridiverticular abscess/fluid collection, 1.9 cm in greatest dimension. No other complicating features. 2. No other acute abnormality within the abdomen or pelvis. Electronically Signed   By: Lajean Manes M.D.   On: 07/11/2022 17:09    Review of Systems  Constitutional:  Positive for fever.  Gastrointestinal:  Positive for abdominal pain and nausea. Negative for blood in stool and vomiting.  All other systems reviewed and are negative.  Blood pressure 134/64, pulse 79, temperature (!) 100.8 F (38.2 C),  temperature source Oral, resp. rate 20, SpO2 100 %. Physical Exam Vitals reviewed.  Constitutional:      Appearance: She is well-developed.  Eyes:     General: No scleral icterus. Cardiovascular:     Rate and Rhythm: Normal rate.  Pulmonary:     Effort: Pulmonary effort is normal.  Abdominal:     Palpations: Abdomen is soft.     Tenderness: There is abdominal tenderness in the suprapubic area and left lower quadrant.  Skin:    General: Skin is warm.     Capillary Refill: Capillary refill takes less than 2 seconds.  Neurological:     General: No focal deficit present.     Mental Status: She is alert.  Psychiatric:        Mood and Affect: Mood normal.        Behavior: Behavior normal.     Assessment/Plan: Diverticulitis with small abscess -recommend zosyn for complicated disease -can have fulls -should resolve only complicating factor is her immunosuppression -discussed abx only, if not improving may need repeat ct scan or less likely surgery -if resolves will need csc as last was some years ago (she cant remember) -dvt prophylaxis fine and would recommend  Rolm Bookbinder 07/11/2022, 8:47 PM   I have reviewed cbc, cmp, ua. I reviewed ct independently and concur. I discussed case with ER provider who was admitting to medical service.

## 2022-07-12 DIAGNOSIS — R1032 Left lower quadrant pain: Secondary | ICD-10-CM | POA: Diagnosis present

## 2022-07-12 DIAGNOSIS — M549 Dorsalgia, unspecified: Secondary | ICD-10-CM | POA: Diagnosis present

## 2022-07-12 DIAGNOSIS — Z7952 Long term (current) use of systemic steroids: Secondary | ICD-10-CM | POA: Diagnosis not present

## 2022-07-12 DIAGNOSIS — Z79899 Other long term (current) drug therapy: Secondary | ICD-10-CM | POA: Diagnosis not present

## 2022-07-12 DIAGNOSIS — D849 Immunodeficiency, unspecified: Secondary | ICD-10-CM | POA: Diagnosis present

## 2022-07-12 DIAGNOSIS — R059 Cough, unspecified: Secondary | ICD-10-CM | POA: Diagnosis not present

## 2022-07-12 DIAGNOSIS — K6389 Other specified diseases of intestine: Secondary | ICD-10-CM | POA: Diagnosis not present

## 2022-07-12 DIAGNOSIS — R0602 Shortness of breath: Secondary | ICD-10-CM | POA: Diagnosis not present

## 2022-07-12 DIAGNOSIS — K5720 Diverticulitis of large intestine with perforation and abscess without bleeding: Secondary | ICD-10-CM | POA: Diagnosis present

## 2022-07-12 DIAGNOSIS — N1330 Unspecified hydronephrosis: Secondary | ICD-10-CM | POA: Diagnosis not present

## 2022-07-12 DIAGNOSIS — K5780 Diverticulitis of intestine, part unspecified, with perforation and abscess without bleeding: Secondary | ICD-10-CM | POA: Diagnosis present

## 2022-07-12 DIAGNOSIS — N134 Hydroureter: Secondary | ICD-10-CM | POA: Diagnosis not present

## 2022-07-12 DIAGNOSIS — E876 Hypokalemia: Secondary | ICD-10-CM | POA: Diagnosis present

## 2022-07-12 DIAGNOSIS — Z7982 Long term (current) use of aspirin: Secondary | ICD-10-CM | POA: Diagnosis not present

## 2022-07-12 DIAGNOSIS — I2510 Atherosclerotic heart disease of native coronary artery without angina pectoris: Secondary | ICD-10-CM | POA: Diagnosis present

## 2022-07-12 DIAGNOSIS — Z221 Carrier of other intestinal infectious diseases: Secondary | ICD-10-CM | POA: Diagnosis not present

## 2022-07-12 DIAGNOSIS — I10 Essential (primary) hypertension: Secondary | ICD-10-CM | POA: Diagnosis present

## 2022-07-12 DIAGNOSIS — M353 Polymyalgia rheumatica: Secondary | ICD-10-CM | POA: Diagnosis present

## 2022-07-12 DIAGNOSIS — N390 Urinary tract infection, site not specified: Secondary | ICD-10-CM | POA: Diagnosis present

## 2022-07-12 DIAGNOSIS — Z809 Family history of malignant neoplasm, unspecified: Secondary | ICD-10-CM | POA: Diagnosis not present

## 2022-07-12 DIAGNOSIS — I6523 Occlusion and stenosis of bilateral carotid arteries: Secondary | ICD-10-CM | POA: Diagnosis present

## 2022-07-12 DIAGNOSIS — N321 Vesicointestinal fistula: Secondary | ICD-10-CM | POA: Diagnosis present

## 2022-07-12 DIAGNOSIS — E785 Hyperlipidemia, unspecified: Secondary | ICD-10-CM | POA: Diagnosis present

## 2022-07-12 DIAGNOSIS — I739 Peripheral vascular disease, unspecified: Secondary | ICD-10-CM | POA: Diagnosis present

## 2022-07-12 DIAGNOSIS — E440 Moderate protein-calorie malnutrition: Secondary | ICD-10-CM | POA: Diagnosis present

## 2022-07-12 DIAGNOSIS — I700 Atherosclerosis of aorta: Secondary | ICD-10-CM | POA: Diagnosis present

## 2022-07-12 DIAGNOSIS — B961 Klebsiella pneumoniae [K. pneumoniae] as the cause of diseases classified elsewhere: Secondary | ICD-10-CM | POA: Diagnosis present

## 2022-07-12 DIAGNOSIS — Z6828 Body mass index (BMI) 28.0-28.9, adult: Secondary | ICD-10-CM | POA: Diagnosis not present

## 2022-07-12 DIAGNOSIS — G8929 Other chronic pain: Secondary | ICD-10-CM | POA: Diagnosis present

## 2022-07-12 LAB — CBC
HCT: 33.6 % — ABNORMAL LOW (ref 36.0–46.0)
Hemoglobin: 10.5 g/dL — ABNORMAL LOW (ref 12.0–15.0)
MCH: 29.6 pg (ref 26.0–34.0)
MCHC: 31.3 g/dL (ref 30.0–36.0)
MCV: 94.6 fL (ref 80.0–100.0)
Platelets: 168 10*3/uL (ref 150–400)
RBC: 3.55 MIL/uL — ABNORMAL LOW (ref 3.87–5.11)
RDW: 15 % (ref 11.5–15.5)
WBC: 8.9 10*3/uL (ref 4.0–10.5)
nRBC: 0 % (ref 0.0–0.2)

## 2022-07-12 LAB — COMPREHENSIVE METABOLIC PANEL
ALT: 9 U/L (ref 0–44)
AST: 9 U/L — ABNORMAL LOW (ref 15–41)
Albumin: 3.1 g/dL — ABNORMAL LOW (ref 3.5–5.0)
Alkaline Phosphatase: 47 U/L (ref 38–126)
Anion gap: 7 (ref 5–15)
BUN: 8 mg/dL (ref 8–23)
CO2: 24 mmol/L (ref 22–32)
Calcium: 8 mg/dL — ABNORMAL LOW (ref 8.9–10.3)
Chloride: 110 mmol/L (ref 98–111)
Creatinine, Ser: 0.99 mg/dL (ref 0.44–1.00)
GFR, Estimated: >60 mL/min (ref >60)
Glucose, Bld: 93 mg/dL (ref 70–99)
Potassium: 3.3 mmol/L — ABNORMAL LOW (ref 3.5–5.1)
Sodium: 141 mmol/L (ref 135–145)
Total Bilirubin: 0.9 mg/dL (ref 0.3–1.2)
Total Protein: 6 g/dL — ABNORMAL LOW (ref 6.5–8.1)

## 2022-07-12 LAB — MAGNESIUM: Magnesium: 1.8 mg/dL (ref 1.7–2.4)

## 2022-07-12 MED ORDER — FOLIC ACID 1 MG PO TABS
500.0000 ug | ORAL_TABLET | Freq: Every day | ORAL | Status: DC
Start: 2022-07-12 — End: 2022-07-27
  Administered 2022-07-12 – 2022-07-27 (×16): 0.5 mg via ORAL
  Filled 2022-07-12 (×16): qty 1

## 2022-07-12 MED ORDER — PIPERACILLIN-TAZOBACTAM 3.375 G IVPB
3.3750 g | Freq: Three times a day (TID) | INTRAVENOUS | Status: DC
  Administered 2022-07-12 – 2022-07-14 (×7): 3.375 g via INTRAVENOUS
  Filled 2022-07-12 (×7): qty 50

## 2022-07-12 MED ORDER — POTASSIUM CHLORIDE 20 MEQ PO PACK
40.0000 meq | PACK | Freq: Two times a day (BID) | ORAL | Status: AC
Start: 2022-07-12 — End: 2022-07-12
  Administered 2022-07-12 (×2): 40 meq via ORAL
  Filled 2022-07-12 (×2): qty 2

## 2022-07-12 MED ORDER — PREDNISONE 1 MG PO TABS
2.0000 mg | ORAL_TABLET | Freq: Every day | ORAL | Status: DC
  Administered 2022-07-12 – 2022-07-27 (×16): 2 mg via ORAL
  Filled 2022-07-12 (×17): qty 2

## 2022-07-12 MED ORDER — ROSUVASTATIN CALCIUM 10 MG PO TABS
10.0000 mg | ORAL_TABLET | Freq: Every day | ORAL | Status: DC
  Administered 2022-07-12 – 2022-07-27 (×16): 10 mg via ORAL
  Filled 2022-07-12 (×16): qty 1

## 2022-07-12 MED ORDER — CALCIUM CARBONATE ANTACID 500 MG PO CHEW
1.0000 | CHEWABLE_TABLET | Freq: Every day | ORAL | Status: DC
Start: 2022-07-12 — End: 2022-07-27
  Administered 2022-07-12 – 2022-07-27 (×16): 200 mg via ORAL
  Filled 2022-07-12 (×16): qty 1

## 2022-07-12 MED ORDER — GUAIFENESIN-DM 100-10 MG/5ML PO SYRP
5.0000 mL | ORAL_SOLUTION | ORAL | Status: DC | PRN
  Administered 2022-07-13: 5 mL via ORAL
  Filled 2022-07-12 (×2): qty 10

## 2022-07-12 NOTE — Progress Notes (Signed)
[facility] Progress Note     Subjective: CC-  Feels about the same since admission, no better no worse. Continues to have some lower abdominal pain L>R. Ok at rest, worse with movement and palpation. Mild nausea at times, no emesis. No BM. WBC normalized 8.9  Objective: Vital signs in last 24 hours: Temp:  [98.1 F (36.7 C)-100.8 F (38.2 C)] 98.6 F (37 C) (09/07 0748) Pulse Rate:  [73-93] 73 (09/07 0748) Resp:  [16-20] 16 (09/07 0748) BP: (96-134)/(62-77) 102/67 (09/07 0748) SpO2:  [92 %-100 %] 93 % (09/07 0748) Last BM Date : 07/11/22  Intake/Output from previous day: 09/06 0701 - 09/07 0700 In: 900 [I.V.:700; IV Piggyback:200] Out: -  Intake/Output this shift: No intake/output data recorded.  PE: Gen:  Alert, NAD, pleasant Abd: soft, ND, TTP LLQ and suprapubic region without peritonitis   Lab Results:  Recent Labs    07/11/22 1446 07/12/22 0708  WBC 11.1* 8.9  HGB 12.6 10.5*  HCT 39.0 33.6*  PLT 188 168   BMET Recent Labs    07/11/22 1446 07/12/22 0708  NA 139 141  K 3.6 3.3*  CL 107 110  CO2 23 24  GLUCOSE 101* 93  BUN 10 8  CREATININE 1.08* 0.99  CALCIUM 9.1 8.0*   PT/INR No results for input(s): "LABPROT", "INR" in the last 72 hours. CMP     Component Value Date/Time   NA 141 07/12/2022 0708   K 3.3 (L) 07/12/2022 0708   CL 110 07/12/2022 0708   CO2 24 07/12/2022 0708   GLUCOSE 93 07/12/2022 0708   BUN 8 07/12/2022 0708   CREATININE 0.99 07/12/2022 0708   CREATININE 1.00 02/16/2012 1509   CALCIUM 8.0 (L) 07/12/2022 0708   PROT 6.0 (L) 07/12/2022 0708   ALBUMIN 3.1 (L) 07/12/2022 0708   AST 9 (L) 07/12/2022 0708   ALT 9 07/12/2022 0708   ALKPHOS 47 07/12/2022 0708   BILITOT 0.9 07/12/2022 0708   GFRNONAA >60 07/12/2022 0708   GFRAA 46 (L) 01/10/2019 2103   Lipase     Component Value Date/Time   LIPASE 23 07/11/2022 1446       Studies/Results: CT ABDOMEN PELVIS W CONTRAST  Result Date: 07/11/2022 CLINICAL  DATA:  Left lower quadrant abdominal pain. EXAM: CT ABDOMEN AND PELVIS WITH CONTRAST TECHNIQUE: Multidetector CT imaging of the abdomen and pelvis was performed using the standard protocol following bolus administration of intravenous contrast. RADIATION DOSE REDUCTION: This exam was performed according to the departmental dose-optimization program which includes automated exposure control, adjustment of the mA and/or kV according to patient size and/or use of iterative reconstruction technique. CONTRAST:  47m OMNIPAQUE IOHEXOL 300 MG/ML  SOLN COMPARISON:  01/10/2019. FINDINGS: Lower chest: Chronic scarring at the left lateral lung base. Small hiatal hernia. No acute findings. Hepatobiliary: Liver normal in overall size attenuation. Stable left lobe hemangioma. Subcentimeter low-attenuation lesion, segment 7 consistent with a cyst. No other liver abnormalities. Gallbladder is distended, otherwise unremarkable. No bile duct dilation. Pancreas: Unremarkable. No pancreatic ductal dilatation or surrounding inflammatory changes. Spleen: Normal in size without focal abnormality. Adrenals/Urinary Tract: Normal adrenal glands. Right mid to lower pole renal cortical scarring. 12 mm low-attenuation mass, lower pole the right kidney, stable consistent with a cyst. No follow-up recommended. No renal stones. No hydronephrosis. Ureters are normal in course and in caliber. Bladder is unremarkable. Stomach/Bowel: Mild wall thickening with adjacent inflammatory stranding and haziness along the proximal to mid sigmoid colon consistent with diverticulitis. Small fluid  collection lies in the mesentery adjacent to the involved segment of sigmoid colon, 1.9 x 1.2 x 1.5 cm. No extraluminal or free air. Multiple diverticula are noted along the sigmoid colon, less prominently along the descending colon. There are no other areas of colonic inflammation. Stomach unremarkable other than the hiatal hernia. Normal small bowel. Normal appendix  visualized. Vascular/Lymphatic: Minor aortic atherosclerosis. No aneurysm. No enlarged lymph nodes. Reproductive: Uterus and bilateral adnexa are unremarkable. Other: No abdominal wall hernia or abnormality. No abdominopelvic ascites. Musculoskeletal: No fracture or acute finding.  No bone lesion. IMPRESSION: 1. Acute sigmoid colon diverticulitis. Small peridiverticular abscess/fluid collection, 1.9 cm in greatest dimension. No other complicating features. 2. No other acute abnormality within the abdomen or pelvis. Electronically Signed   By: Lajean Manes M.D.   On: 07/11/2022 17:09    Anti-infectives: Anti-infectives (From admission, onward)    Start     Dose/Rate Route Frequency Ordered Stop   07/12/22 1800  cefTRIAXone (ROCEPHIN) 2 g in sodium chloride 0.9 % 100 mL IVPB  Status:  Discontinued        2 g 200 mL/hr over 30 Minutes Intravenous Every 24 hours 07/11/22 1759 07/12/22 0836   07/12/22 0930  piperacillin-tazobactam (ZOSYN) IVPB 3.375 g        3.375 g 12.5 mL/hr over 240 Minutes Intravenous Every 8 hours 07/12/22 0836     07/11/22 1800  cefTRIAXone (ROCEPHIN) 1 g in sodium chloride 0.9 % 100 mL IVPB        1 g 200 mL/hr over 30 Minutes Intravenous  Once 07/11/22 1759 07/11/22 2300   07/11/22 1730  metroNIDAZOLE (FLAGYL) IVPB 500 mg  Status:  Discontinued        500 mg 100 mL/hr over 60 Minutes Intravenous Every 12 hours 07/11/22 1726 07/12/22 0836   07/11/22 1545  cefTRIAXone (ROCEPHIN) 1 g in sodium chloride 0.9 % 100 mL IVPB        1 g 200 mL/hr over 30 Minutes Intravenous  Once 07/11/22 1540 07/11/22 1716        Assessment/Plan Diverticulitis with small abscess - 1st bout, last colonoscopy 2013 - CT scan 9/6 shows Acute sigmoid colon diverticulitis with small peridiverticular abscess/fluid collection, 1.9 cm in greatest dimension - No indication for acute surgical intervention. Continue full liquids and IV zosyn. Hopefully this will resolve with antibiotics alone and we  can plan for outpatient follow up with gastroenterology for colonoscopy in 6-8 weeks.   ID - rocephin/flagyl 9/6>>9/7, zosyn 9/7>> FEN - IVF, FLD VTE - SCDs, lovenox Foley - none  ?UTI - Ucx pending PMR on simponi HTN HLD CAD Chronic back pain  I reviewed hospitalist notes, last 24 h vitals and pain scores, last 48 h intake and output, last 24 h labs and trends, and last 24 h imaging results.    LOS: 0 days    Wellington Hampshire, [facility] 07/12/2022, 8:37 AM Please see Amion for pager number during day hours 7:00am-4:30pm

## 2022-07-12 NOTE — Progress Notes (Signed)
PROGRESS NOTE Jenny Glass  DXA:[id number] DOB: 1951-01-27 DOA: 07/11/2022 PCP: Merrilee Seashore, MD   Brief Narrative/Hospital Course: 71 year old female with history of polymyalgia rheumatica, carotid artery stenosis left greater than right, hepatic hemangioma, hyperlipidemia, aortic atherosclerosis, hypertension, chronic back pain presented to the ED with abdominal pain left lower quadrant and suprapubic with nausea x3 days with associated chills, possible fever at home but no vomiting or diarrhea.  Seen at PCP office and instructed to come to the ED. In the ED vitals stable, underwent lab work that showed mild leukocytosis abnormal UA, CT abdomen pelvis with acute sigmoid colon diverticulitis small peridiverticular abscess/fluid collection 1.9 cm.  Patient was given LR 1 L, Rocephin 1 g, Flagyl, lactic acid ordered and admission requested    Subjective: Seen and examined this morning feeling better still has tenderness on the left lower quadrant.  Tolerating diet Remains afebrile mild leukocytosis downtrending  Assessment and Plan: Principal Problem:   Abscess of sigmoid colon due to diverticulitis Active Problems:   Hyperlipidemia   Chronic back pain   Hypertension   Carotid artery stenosis   UTI (urinary tract infection)   Acute sigmoid diverticulitis with a small abscess : CT scan shows 1.9 cm abscess/fluid collection with acute sigmoid Diverticulitis.  Appreciate surgical input changed to Zosyn for complicated diverticulitis, tolerating diet, advance as tolerated.  Currently no indication for acute surgical intervention hopefully this will resolve with antibiotics alone.Continue pain control stool softener IV fluid hydration. Her last colonoscopy abt 8hr ago and was normal per her-discussed further need to have outpatient colonoscopy in 6 to 8 weeks follow-up.  Hypokalemia replace orally  UTI: Continue antibiotics as above follow-up urine culture   PMR on simponi inj q6-8 wk,  methotrexate inj q wkly, and prednisone:resume home meds.Patient is immunocompromised status given chronic steroid use   Hyperlipidemia Hypertension Carotid artery stenosis: No chest pain.  BP is controlled.  Continue home meds losartan 50 mg, crestor 10 mg,   Chronic back pain: New pain control.   DVT prophylaxis: enoxaparin (LOVENOX) injection 40 mg Start: 07/11/22 2200 Code Status:   Code Status: Full Code Family Communication: plan of care discussed with patient at bedside. Patient status is: Remains hospitalized for continued IV antibiotics for complicated diverticulitis Level of care: Med-Surg  Dispo: The patient is from: home            Anticipated disposition: home in 2 days  Mobility Assessment (last 72 hours)     Mobility Assessment     Row Name 07/11/22 2300           Does patient have an order for bedrest or is patient medically unstable No - Continue assessment       What is the highest level of mobility based on the progressive mobility assessment? Level 5 (Walks with assist in room/hall) - Balance while stepping forward/back and can walk in room with assist - Complete                 Objective: Vitals last 24 hrs: Vitals:   07/12/22 0006 07/12/22 0214 07/12/22 0351 07/12/22 0748  BP: 128/66  96/62 102/67  Pulse: 88  92 73  Resp: '20  18 16  '$ Temp: 99.8 F (37.7 C) 98.7 F (37.1 C) 98.7 F (37.1 C) 98.6 F (37 C)  TempSrc: Oral  Oral Oral  SpO2: 94%  92% 93%   Weight change:   Physical Examination: General exam: alert awake,older than stated age, weak appearing. HEENT:Oral mucosa moist,  Ear/Nose WNL grossly, dentition normal. Respiratory system: bilaterally CLEAR BS, no use of accessory muscle Cardiovascular system: S1 & S2 +, No JVD. Gastrointestinal system: Abdomen soft, tender  LLQ Nervous System:Alert, awake, moving extremities and grossly nonfocal Extremities: LE edema neg,distal peripheral pulses palpable.  Skin: No rashes,no  icterus. MSK: Normal muscle bulk,tone, power  Medications reviewed:  Scheduled Meds:  enoxaparin (LOVENOX) injection  40 mg Subcutaneous Q24H   potassium chloride  40 mEq Oral BID   predniSONE  2 mg Oral Q breakfast   Continuous Infusions:  sodium chloride 100 mL/hr at 07/12/22 0642   piperacillin-tazobactam (ZOSYN)  IV 3.375 g (07/12/22 0867)      Diet Order             Diet full liquid Room service appropriate? Yes; Fluid consistency: Thin  Diet effective now                            Intake/Output Summary (Last 24 hours) at 07/12/2022 1158 Last data filed at 07/12/2022 0343 Gross per 24 hour  Intake 900 ml  Output --  Net 900 ml   Net IO Since Admission: 900 mL [07/12/22 1158]  Wt Readings from Last 3 Encounters:  06/20/22 74.4 kg  05/01/22 75.4 kg  02/13/16 68 kg     Unresulted Labs (From admission, onward)     Start     Ordered   07/18/22 0500  Creatinine, serum  (enoxaparin (LOVENOX)    CrCl >/= 30 ml/min)  Weekly,   R     Comments: while on enoxaparin therapy    07/11/22 1740   07/13/22 0500  CBC  Tomorrow morning,   R        07/12/22 0836   07/13/22 6195  Basic metabolic panel  Tomorrow morning,   R        07/12/22 0836   07/11/22 1542  Urine Culture  Once,   URGENT       Question:  Indication  Answer:  Dysuria   07/11/22 1541          Data Reviewed: I have personally reviewed following labs and imaging studies CBC: Recent Labs  Lab 07/11/22 1446 07/12/22 0708  WBC 11.1* 8.9  HGB 12.6 10.5*  HCT 39.0 33.6*  MCV 93.5 94.6  PLT 188 093   Basic Metabolic Panel: Recent Labs  Lab 07/11/22 1446 07/12/22 0708  NA 139 141  K 3.6 3.3*  CL 107 110  CO2 23 24  GLUCOSE 101* 93  BUN 10 8  CREATININE 1.08* 0.99  CALCIUM 9.1 8.0*  MG  --  1.8   GFR: CrCl cannot be calculated (Unknown ideal weight.). Liver Function Tests: Recent Labs  Lab 07/11/22 1446 07/12/22 0708  AST 13* 9*  ALT 11 9  ALKPHOS 55 47  BILITOT 0.9 0.9  PROT  7.3 6.0*  ALBUMIN 3.9 3.1*   Recent Labs  Lab 07/11/22 1446  LIPASE 23   No results for input(s): "AMMONIA" in the last 168 hours. Coagulation Profile: No results for input(s): "INR", "PROTIME" in the last 168 hours. BNP (last 3 results) No results for input(s): "PROBNP" in the last 8760 hours. HbA1C: No results for input(s): "HGBA1C" in the last 72 hours. CBG: No results for input(s): "GLUCAP" in the last 168 hours. Lipid Profile: No results for input(s): "CHOL", "HDL", "LDLCALC", "TRIG", "CHOLHDL", "LDLDIRECT" in the last 72 hours. Thyroid Function Tests: No results for input(s): "TSH", "  T4TOTAL", "FREET4", "T3FREE", "THYROIDAB" in the last 72 hours. Sepsis Labs: Recent Labs  Lab 07/11/22 1929  LATICACIDVEN 1.1    No results found for this or any previous visit (from the past 240 hour(s)).  Antimicrobials: Anti-infectives (From admission, onward)    Start     Dose/Rate Route Frequency Ordered Stop   07/12/22 1800  cefTRIAXone (ROCEPHIN) 2 g in sodium chloride 0.9 % 100 mL IVPB  Status:  Discontinued        2 g 200 mL/hr over 30 Minutes Intravenous Every 24 hours 07/11/22 1759 07/12/22 0836   07/12/22 0900  piperacillin-tazobactam (ZOSYN) IVPB 3.375 g        3.375 g 12.5 mL/hr over 240 Minutes Intravenous Every 8 hours 07/12/22 0836     07/11/22 1800  cefTRIAXone (ROCEPHIN) 1 g in sodium chloride 0.9 % 100 mL IVPB        1 g 200 mL/hr over 30 Minutes Intravenous  Once 07/11/22 1759 07/11/22 2300   07/11/22 1730  metroNIDAZOLE (FLAGYL) IVPB 500 mg  Status:  Discontinued        500 mg 100 mL/hr over 60 Minutes Intravenous Every 12 hours 07/11/22 1726 07/12/22 0836   07/11/22 1545  cefTRIAXone (ROCEPHIN) 1 g in sodium chloride 0.9 % 100 mL IVPB        1 g 200 mL/hr over 30 Minutes Intravenous  Once 07/11/22 1540 07/11/22 1716      Culture/Microbiology    Component Value Date/Time   SDES URINE, CLEAN CATCH 03/16/2013 2203   SPECREQUEST CX ADDED AT 2239 ON 315176  03/16/2013 2203   CULT ESCHERICHIA COLI 03/16/2013 2203   REPTSTATUS 03/18/2013 FINAL 03/16/2013 2203   Radiology Studies: CT ABDOMEN PELVIS W CONTRAST  Result Date: 07/11/2022 CLINICAL DATA:  Left lower quadrant abdominal pain. EXAM: CT ABDOMEN AND PELVIS WITH CONTRAST TECHNIQUE: Multidetector CT imaging of the abdomen and pelvis was performed using the standard protocol following bolus administration of intravenous contrast. RADIATION DOSE REDUCTION: This exam was performed according to the departmental dose-optimization program which includes automated exposure control, adjustment of the mA and/or kV according to patient size and/or use of iterative reconstruction technique. CONTRAST:  68m OMNIPAQUE IOHEXOL 300 MG/ML  SOLN COMPARISON:  01/10/2019. FINDINGS: Lower chest: Chronic scarring at the left lateral lung base. Small hiatal hernia. No acute findings. Hepatobiliary: Liver normal in overall size attenuation. Stable left lobe hemangioma. Subcentimeter low-attenuation lesion, segment 7 consistent with a cyst. No other liver abnormalities. Gallbladder is distended, otherwise unremarkable. No bile duct dilation. Pancreas: Unremarkable. No pancreatic ductal dilatation or surrounding inflammatory changes. Spleen: Normal in size without focal abnormality. Adrenals/Urinary Tract: Normal adrenal glands. Right mid to lower pole renal cortical scarring. 12 mm low-attenuation mass, lower pole the right kidney, stable consistent with a cyst. No follow-up recommended. No renal stones. No hydronephrosis. Ureters are normal in course and in caliber. Bladder is unremarkable. Stomach/Bowel: Mild wall thickening with adjacent inflammatory stranding and haziness along the proximal to mid sigmoid colon consistent with diverticulitis. Small fluid collection lies in the mesentery adjacent to the involved segment of sigmoid colon, 1.9 x 1.2 x 1.5 cm. No extraluminal or free air. Multiple diverticula are noted along the  sigmoid colon, less prominently along the descending colon. There are no other areas of colonic inflammation. Stomach unremarkable other than the hiatal hernia. Normal small bowel. Normal appendix visualized. Vascular/Lymphatic: Minor aortic atherosclerosis. No aneurysm. No enlarged lymph nodes. Reproductive: Uterus and bilateral adnexa are unremarkable. Other: No abdominal wall  hernia or abnormality. No abdominopelvic ascites. Musculoskeletal: No fracture or acute finding.  No bone lesion. IMPRESSION: 1. Acute sigmoid colon diverticulitis. Small peridiverticular abscess/fluid collection, 1.9 cm in greatest dimension. No other complicating features. 2. No other acute abnormality within the abdomen or pelvis. Electronically Signed   By: Lajean Manes M.D.   On: 07/11/2022 17:09     LOS: 0 days   Antonieta Pert, MD [facility]  07/12/2022, 11:58 AM

## 2022-07-12 NOTE — Plan of Care (Signed)
Pt reports pain 3/10 this morning.  No nausea at this time.  Appetite remains decreased.  Problem: Education: Goal: Knowledge of General Education information will improve Description: Including pain rating scale, medication(s)/side effects and non-pharmacologic comfort measures Outcome: Progressing   Problem: Health Behavior/Discharge Planning: Goal: Ability to manage health-related needs will improve Outcome: Progressing   Problem: Clinical Measurements: Goal: Ability to maintain clinical measurements within normal limits will improve Outcome: Progressing Goal: Will remain free from infection Outcome: Progressing Goal: Diagnostic test results will improve Outcome: Progressing Goal: Respiratory complications will improve Outcome: Progressing Goal: Cardiovascular complication will be avoided Outcome: Progressing   Problem: Activity: Goal: Risk for activity intolerance will decrease Outcome: Progressing   Problem: Nutrition: Goal: Adequate nutrition will be maintained Outcome: Progressing   Problem: Coping: Goal: Level of anxiety will decrease Outcome: Progressing   Problem: Elimination: Goal: Will not experience complications related to bowel motility Outcome: Progressing Goal: Will not experience complications related to urinary retention Outcome: Progressing   Problem: Pain Managment: Goal: General experience of comfort will improve Outcome: Progressing   Problem: Safety: Goal: Ability to remain free from injury will improve Outcome: Progressing   Problem: Skin Integrity: Goal: Risk for impaired skin integrity will decrease Outcome: Progressing   Problem: Education: Goal: Knowledge of General Education information will improve Description: Including pain rating scale, medication(s)/side effects and non-pharmacologic comfort measures Outcome: Progressing   Problem: Health Behavior/Discharge Planning: Goal: Ability to manage health-related needs will  improve Outcome: Progressing   Problem: Clinical Measurements: Goal: Ability to maintain clinical measurements within normal limits will improve Outcome: Progressing Goal: Will remain free from infection Outcome: Progressing Goal: Diagnostic test results will improve Outcome: Progressing Goal: Respiratory complications will improve Outcome: Progressing Goal: Cardiovascular complication will be avoided Outcome: Progressing   Problem: Activity: Goal: Risk for activity intolerance will decrease Outcome: Progressing   Problem: Nutrition: Goal: Adequate nutrition will be maintained Outcome: Progressing   Problem: Coping: Goal: Level of anxiety will decrease Outcome: Progressing   Problem: Elimination: Goal: Will not experience complications related to bowel motility Outcome: Progressing Goal: Will not experience complications related to urinary retention Outcome: Progressing   Problem: Pain Managment: Goal: General experience of comfort will improve Outcome: Progressing   Problem: Safety: Goal: Ability to remain free from injury will improve Outcome: Progressing   Problem: Skin Integrity: Goal: Risk for impaired skin integrity will decrease Outcome: Progressing

## 2022-07-12 NOTE — Progress Notes (Signed)
Mobility Specialist - Progress Note   07/12/22 1534  Mobility  HOB Elevated/Bed Position Self regulated  Activity Ambulated with assistance in hallway  Range of Motion/Exercises Active  Level of Assistance Modified independent, requires aide device or extra time  Assistive Device  (IV Pole)  Distance Ambulated (ft) 120 ft  Activity Response Tolerated well  Transport method Ambulatory  $Mobility charge 1 Mobility   Pt received in bed and agreeable to mobility. Pt shivering during ambulation & c/o feeling weak. Pt to bed after session with all needs met.    [facility]

## 2022-07-12 NOTE — TOC Initial Note (Signed)
Transition of Care [facility]) - Initial/Assessment Note    Patient Details  Name: Jenny Glass MRN: [id number] Date of Birth: 07-05-1951  Transition of Care [facility]) CM/SW Contact:    Leeroy Cha, RN Phone Number: 07/12/2022, 7:43 AM  Clinical Narrative:                   Transition of Care [facility]) Screening Note   Patient Details  Name: Jenny Glass Date of Birth: 1951/07/19   Transition of Care [facility]) CM/SW Contact:    Leeroy Cha, RN Phone Number: 07/12/2022, 7:43 AM    Transition of Care Department [facility]) has reviewed patient and no TOC needs have been identified at this time. We will continue to monitor patient advancement through interdisciplinary progression rounds. If new patient transition needs arise, please place a TOC consult.   Expected Discharge Plan: Home/Self Care Barriers to Discharge: Continued Medical Work up   Patient Goals and CMS Choice Patient states their goals for this hospitalization and ongoing recovery are:: to go home CMS Medicare.gov Compare Post Acute Care list provided to:: Patient    Expected Discharge Plan and Services Expected Discharge Plan: Home/Self Care   Discharge Planning Services: CM Consult   Living arrangements for the past 2 months: Single Family Home                                      Prior Living Arrangements/Services Living arrangements for the past 2 months: Single Family Home Lives with:: Spouse Patient language and need for interpreter reviewed:: Yes Do you feel safe going back to the place where you live?: Yes            Criminal Activity/Legal Involvement Pertinent to Current Situation/Hospitalization: No - Comment as needed  Activities of Daily Living Home Assistive Devices/Equipment: None ADL Screening (condition at time of admission) Patient's cognitive ability adequate to safely complete daily activities?: Yes Is the patient deaf or have difficulty hearing?: No Does the patient have  difficulty seeing, even when wearing glasses/contacts?: No Does the patient have difficulty concentrating, remembering, or making decisions?: No Patient able to express need for assistance with ADLs?: Yes Does the patient have difficulty dressing or bathing?: No Independently performs ADLs?: Yes (appropriate for developmental age) Does the patient have difficulty walking or climbing stairs?: No Weakness of Legs: None Weakness of Arms/Hands: None  Permission Sought/Granted                  Emotional Assessment Appearance:: Appears stated age Attitude/Demeanor/Rapport: Engaged Affect (typically observed): Calm Orientation: : Oriented to Place, Oriented to Self, Oriented to  Time, Oriented to Situation Alcohol / Substance Use: Never Used Psych Involvement: No (comment)  Admission diagnosis:  Diverticulitis of colon [K57.32] Urinary tract infection without hematuria, site unspecified [N39.0] Abscess of sigmoid colon due to diverticulitis [K57.20] Patient Active Problem List   Diagnosis Date Noted   Abscess of sigmoid colon due to diverticulitis 07/11/2022   Hyperlipidemia    Chronic back pain    Hypertension    Carotid artery stenosis    UTI (urinary tract infection)    Chronic low back pain 05/25/2016   Rotator cuff tear 11/10/2015   Rupture of rotator cuff of shoulder 11/10/2015   PCP:  Merrilee Seashore, MD Pharmacy:   [facility] [place], [place]  [place] [address] Phone: [phone] Fax: [phone]     Social Determinants of Health (SDOH) Interventions    Readmission Risk Interventions   No data to display

## 2022-07-13 ENCOUNTER — Inpatient Hospital Stay (HOSPITAL_COMMUNITY): Payer: Medicare Other

## 2022-07-13 DIAGNOSIS — K5720 Diverticulitis of large intestine with perforation and abscess without bleeding: Secondary | ICD-10-CM | POA: Diagnosis not present

## 2022-07-13 LAB — BASIC METABOLIC PANEL
Anion gap: 4 — ABNORMAL LOW (ref 5–15)
BUN: 6 mg/dL — ABNORMAL LOW (ref 8–23)
CO2: 24 mmol/L (ref 22–32)
Calcium: 7.7 mg/dL — ABNORMAL LOW (ref 8.9–10.3)
Chloride: 111 mmol/L (ref 98–111)
Creatinine, Ser: 0.94 mg/dL (ref 0.44–1.00)
GFR, Estimated: >60 mL/min (ref >60)
Glucose, Bld: 96 mg/dL (ref 70–99)
Potassium: 3.8 mmol/L (ref 3.5–5.1)
Sodium: 139 mmol/L (ref 135–145)

## 2022-07-13 LAB — CBC
HCT: 34 % — ABNORMAL LOW (ref 36.0–46.0)
Hemoglobin: 10.5 g/dL — ABNORMAL LOW (ref 12.0–15.0)
MCH: 29.8 pg (ref 26.0–34.0)
MCHC: 30.9 g/dL (ref 30.0–36.0)
MCV: 96.6 fL (ref 80.0–100.0)
Platelets: 176 10*3/uL (ref 150–400)
RBC: 3.52 MIL/uL — ABNORMAL LOW (ref 3.87–5.11)
RDW: 14.9 % (ref 11.5–15.5)
WBC: 8 10*3/uL (ref 4.0–10.5)
nRBC: 0 % (ref 0.0–0.2)

## 2022-07-13 LAB — URINE CULTURE: Culture: >=100000 — AB

## 2022-07-13 MED ORDER — GUAIFENESIN ER 600 MG PO TB12
600.0000 mg | ORAL_TABLET | Freq: Two times a day (BID) | ORAL | Status: DC
  Administered 2022-07-13 – 2022-07-27 (×29): 600 mg via ORAL
  Filled 2022-07-13 (×29): qty 1

## 2022-07-13 MED ORDER — ALBUTEROL SULFATE (2.5 MG/3ML) 0.083% IN NEBU
2.5000 mg | INHALATION_SOLUTION | Freq: Once | RESPIRATORY_TRACT | Status: DC
Start: 2022-07-13 — End: 2022-07-21
  Filled 2022-07-13: qty 3

## 2022-07-13 NOTE — Plan of Care (Signed)
  Problem: Education: Goal: Knowledge of General Education information will improve Description: Including pain rating scale, medication(s)/side effects and non-pharmacologic comfort measures Outcome: Progressing   Problem: Health Behavior/Discharge Planning: Goal: Ability to manage health-related needs will improve Outcome: Progressing   Problem: Clinical Measurements: Goal: Ability to maintain clinical measurements within normal limits will improve Outcome: Progressing Goal: Will remain free from infection Outcome: Progressing Goal: Diagnostic test results will improve Outcome: Progressing Goal: Respiratory complications will improve Outcome: Progressing Goal: Cardiovascular complication will be avoided Outcome: Progressing   Problem: Elimination: Goal: Will not experience complications related to bowel motility Outcome: Progressing Goal: Will not experience complications related to urinary retention Outcome: Progressing   Problem: Coping: Goal: Level of anxiety will decrease Outcome: Progressing   Problem: Pain Managment: Goal: General experience of comfort will improve Outcome: Progressing   Problem: Skin Integrity: Goal: Risk for impaired skin integrity will decrease Outcome: Progressing   Problem: Health Behavior/Discharge Planning: Goal: Ability to manage health-related needs will improve Outcome: Progressing

## 2022-07-13 NOTE — Progress Notes (Addendum)
[facility] Progress Note     Subjective: CC-  States that she developed worsening abdominal pain over night. Some nausea, no emesis. Passing flatus and had a couple loose stools over night. New onset pneumaturia as well. She also reports increased shortness of breath, possibly because of the abdominal pain but she also states that she has phlegm she's having difficulty coughing up. WBC 8, TMAX 100.2  Objective: Vital signs in last 24 hours: Temp:  [98.3 F (36.8 C)-100.2 F (37.9 C)] 99.2 F (37.3 C) (09/08 0703) Pulse Rate:  [66-82] 79 (09/08 0703) Resp:  [18] 18 (09/08 0606) BP: (91-132)/(57-82) 132/71 (09/08 0703) SpO2:  [93 %-97 %] 97 % (09/08 0703) Last BM Date : 07/11/22  Intake/Output from previous day: 09/07 0701 - 09/08 0700 In: 118 [P.O.:118] Out: -  Intake/Output this shift: No intake/output data recorded.  PE: Gen:  Alert, NAD, pleasant Cardio: RRR Pulm: CTAB, no wheezing, rate and effort normal on 1L [place] Abd: soft, ND, TTP LLQ and suprapubic region with voluntary guarding, no diffuse peritonitis  Lab Results:  Recent Labs    07/12/22 0708 07/13/22 0625  WBC 8.9 8.0  HGB 10.5* 10.5*  HCT 33.6* 34.0*  PLT 168 176   BMET Recent Labs    07/12/22 0708 07/13/22 0625  NA 141 139  K 3.3* 3.8  CL 110 111  CO2 24 24  GLUCOSE 93 96  BUN 8 6*  CREATININE 0.99 0.94  CALCIUM 8.0* 7.7*   PT/INR No results for input(s): "LABPROT", "INR" in the last 72 hours. CMP     Component Value Date/Time   NA 139 07/13/2022 0625   K 3.8 07/13/2022 0625   CL 111 07/13/2022 0625   CO2 24 07/13/2022 0625   GLUCOSE 96 07/13/2022 0625   BUN 6 (L) 07/13/2022 0625   CREATININE 0.94 07/13/2022 0625   CREATININE 1.00 02/16/2012 1509   CALCIUM 7.7 (L) 07/13/2022 0625   PROT 6.0 (L) 07/12/2022 0708   ALBUMIN 3.1 (L) 07/12/2022 0708   AST 9 (L) 07/12/2022 0708   ALT 9 07/12/2022 0708   ALKPHOS 47 07/12/2022 0708   BILITOT 0.9 07/12/2022 0708   GFRNONAA  >60 07/13/2022 0625   GFRAA 46 (L) 01/10/2019 2103   Lipase     Component Value Date/Time   LIPASE 23 07/11/2022 1446       Studies/Results: CT ABDOMEN PELVIS W CONTRAST  Result Date: 07/11/2022 CLINICAL DATA:  Left lower quadrant abdominal pain. EXAM: CT ABDOMEN AND PELVIS WITH CONTRAST TECHNIQUE: Multidetector CT imaging of the abdomen and pelvis was performed using the standard protocol following bolus administration of intravenous contrast. RADIATION DOSE REDUCTION: This exam was performed according to the departmental dose-optimization program which includes automated exposure control, adjustment of the mA and/or kV according to patient size and/or use of iterative reconstruction technique. CONTRAST:  6m OMNIPAQUE IOHEXOL 300 MG/ML  SOLN COMPARISON:  01/10/2019. FINDINGS: Lower chest: Chronic scarring at the left lateral lung base. Small hiatal hernia. No acute findings. Hepatobiliary: Liver normal in overall size attenuation. Stable left lobe hemangioma. Subcentimeter low-attenuation lesion, segment 7 consistent with a cyst. No other liver abnormalities. Gallbladder is distended, otherwise unremarkable. No bile duct dilation. Pancreas: Unremarkable. No pancreatic ductal dilatation or surrounding inflammatory changes. Spleen: Normal in size without focal abnormality. Adrenals/Urinary Tract: Normal adrenal glands. Right mid to lower pole renal cortical scarring. 12 mm low-attenuation mass, lower pole the right kidney, stable consistent with a cyst. No follow-up recommended. No renal stones.  No hydronephrosis. Ureters are normal in course and in caliber. Bladder is unremarkable. Stomach/Bowel: Mild wall thickening with adjacent inflammatory stranding and haziness along the proximal to mid sigmoid colon consistent with diverticulitis. Small fluid collection lies in the mesentery adjacent to the involved segment of sigmoid colon, 1.9 x 1.2 x 1.5 cm. No extraluminal or free air. Multiple diverticula  are noted along the sigmoid colon, less prominently along the descending colon. There are no other areas of colonic inflammation. Stomach unremarkable other than the hiatal hernia. Normal small bowel. Normal appendix visualized. Vascular/Lymphatic: Minor aortic atherosclerosis. No aneurysm. No enlarged lymph nodes. Reproductive: Uterus and bilateral adnexa are unremarkable. Other: No abdominal wall hernia or abnormality. No abdominopelvic ascites. Musculoskeletal: No fracture or acute finding.  No bone lesion. IMPRESSION: 1. Acute sigmoid colon diverticulitis. Small peridiverticular abscess/fluid collection, 1.9 cm in greatest dimension. No other complicating features. 2. No other acute abnormality within the abdomen or pelvis. Electronically Signed   By: Lajean Manes M.D.   On: 07/11/2022 17:09    Anti-infectives: Anti-infectives (From admission, onward)    Start     Dose/Rate Route Frequency Ordered Stop   07/12/22 1800  cefTRIAXone (ROCEPHIN) 2 g in sodium chloride 0.9 % 100 mL IVPB  Status:  Discontinued        2 g 200 mL/hr over 30 Minutes Intravenous Every 24 hours 07/11/22 1759 07/12/22 0836   07/12/22 0900  piperacillin-tazobactam (ZOSYN) IVPB 3.375 g        3.375 g 12.5 mL/hr over 240 Minutes Intravenous Every 8 hours 07/12/22 0836     07/11/22 1800  cefTRIAXone (ROCEPHIN) 1 g in sodium chloride 0.9 % 100 mL IVPB        1 g 200 mL/hr over 30 Minutes Intravenous  Once 07/11/22 1759 07/11/22 2300   07/11/22 1730  metroNIDAZOLE (FLAGYL) IVPB 500 mg  Status:  Discontinued        500 mg 100 mL/hr over 60 Minutes Intravenous Every 12 hours 07/11/22 1726 07/12/22 0836   07/11/22 1545  cefTRIAXone (ROCEPHIN) 1 g in sodium chloride 0.9 % 100 mL IVPB        1 g 200 mL/hr over 30 Minutes Intravenous  Once 07/11/22 1540 07/11/22 1716        Assessment/Plan Diverticulitis with small abscess - 1st bout, last colonoscopy 2013 - CT scan 9/6 shows Acute sigmoid colon diverticulitis with small  peridiverticular abscess/fluid collection, 1.9 cm in greatest dimension - WBC 8, TMAX 100.2 otherwise vital signs stable. Worsening abdominal pain and new onset pneumaturia today indicating colovesical fistula. Likely is going to need repeat imaging, will discuss timing with MD since it has only been 2 days since last CT. Back diet down to ice chips/sips. Continue IV zosyn. Monitor abdominal exam closely. Will check CXR, add mucinex, IS and flutter valve.   ID - rocephin/flagyl 9/6>>9/7, zosyn 9/7>> FEN - IVF, ice chips/sips VTE - SCDs, lovenox Foley - none   Klebsiella pneumoniae UTI - on zosyn PMR on simponi HTN HLD CAD Chronic back pain   I reviewed hospitalist notes, last 24 h vitals and pain scores, last 48 h intake and output, last 24 h labs and trends    LOS: 1 day    Wellington Hampshire, [facility] Surgery 07/13/2022, 8:52 AM Please see Amion for pager number during day hours 7:00am-4:30pm

## 2022-07-13 NOTE — Progress Notes (Signed)
Mobility Specialist Cancellation/Refusal Note:   Reason for Cancellation/Refusal: Pt declined mobility at this time. Pt not feeling well. Will check back possibly tomorrow.     [facility]

## 2022-07-13 NOTE — Progress Notes (Addendum)
PROGRESS NOTE Jenny Glass  WNI:[id number] DOB: 1950-11-09 DOA: 07/11/2022 PCP: Merrilee Seashore, MD   Brief Narrative/Hospital Course: 71 year old female with history of polymyalgia rheumatica, carotid artery stenosis left greater than right, hepatic hemangioma, hyperlipidemia, aortic atherosclerosis, hypertension, chronic back pain presented to the ED with abdominal pain left lower quadrant and suprapubic with nausea x3 days with associated chills, possible fever at home but no vomiting or diarrhea.  Seen at PCP office and instructed to come to the ED. In the ED vitals stable, underwent lab work that showed mild leukocytosis abnormal UA, CT abdomen pelvis with acute sigmoid colon diverticulitis small peridiverticular abscess/fluid collection 1.9 cm.  Patient was given LR 1 L, Rocephin 1 g, Flagyl, lactic acid ordered and admission requested.  Seen by surgery, antibiotic changed to Zosyn for complicated diverticulitis-being managed conservatively    Subjective: Seen and examined.  Husband at the bedside.  Patient reports she does not feel well comfortable discharge of breath unable to take a deep breath with left lower quadrant abdominal pain Overnight patient had some wheezing, Tmax 100.2  Labs stable  Assessment and Plan: Principal Problem:   Abscess of sigmoid colon due to diverticulitis Active Problems:   Hyperlipidemia   Chronic back pain   Hypertension   Carotid artery stenosis   UTI (urinary tract infection)   Diverticulitis of intestine with abscess   Acute sigmoid diverticulitis with a small abscess 1.9 cm in CT:  Being managed conservatively with surgery following.  Having worsening abdominal pain, new onset pneumaturia indicating colovesical fistula-await surgery plan - likely need repeat CT scan. Continue Zosyn for complicated diverticulitis, keeping NPO for now. Continue pain control stool softener IV fluid hydration. Her last colonoscopy abt 8hr ago and was normal per  her-discussed further need to have outpatient colonoscopy in 6 to 8 weeks follow-up.  Wheezing/shortness of breath checked chest x-ray-it's w/o any acute finding, decrease IV fluid rate , encourage incentive spirometry, bronchodilators.  Hypokalemia resolved GNR UTI: Continue antibiotics as above, ucx- GNR-final pending  PMR on simponi inj q6-8 wk, methotrexate inj q wkly, and prednisone- cont steroid. Patient is immunocompromised status given chronic steroid use  Hyperlipidemia Hypertension Carotid artery stenosis: No chest pain.  BP is soft. Hold home amlodipine and losartan. Cont crestor 10 mg,   Chronic back pain: New pain control.   DVT prophylaxis: enoxaparin (LOVENOX) injection 40 mg Start: 07/11/22 2200 Code Status:   Code Status: Full Code Family Communication: plan of care discussed with patient and her husband at bedside. Patient status is: Remains hospitalized for continued IV antibiotics for complicated diverticulitis Level of care: Med-Surg  Dispo: The patient is from: home            Anticipated disposition: home  Mobility Assessment (last 72 hours)     Mobility Assessment     Row Name 07/13/22 0844 07/12/22 2017 07/12/22 1015 07/11/22 2300     Does patient have an order for bedrest or is patient medically unstable No - Continue assessment No - Continue assessment No - Continue assessment No - Continue assessment    What is the highest level of mobility based on the progressive mobility assessment? Level 6 (Walks independently in room and hall) - Balance while walking in room without assist - Complete Level 6 (Walks independently in room and hall) - Balance while walking in room without assist - Complete Level 6 (Walks independently in room and hall) - Balance while walking in room without assist - Complete Level 5 (Walks with  assist in room/hall) - Balance while stepping forward/back and can walk in room with assist - Complete           Objective: Vitals last 24  hrs: Vitals:   07/12/22 1302 07/12/22 2214 07/13/22 0606 07/13/22 0703  BP: (!) 91/57 119/68 112/82 132/71  Pulse: 66 82 79 79  Resp: '18 18 18   '$ Temp: 98.3 F (36.8 C) 100.2 F (37.9 C) 98.4 F (36.9 C) 99.2 F (37.3 C)  TempSrc: Oral Oral Oral Oral  SpO2: 96% 93% 94% 97%   Weight change:   Physical Examination: General exam: AAox3, older than stated age, weak appearing. HEENT:Oral mucosa moist, Ear/Nose WNL grossly, dentition normal. Respiratory system: bilaterally diminished, no use of accessory muscle Cardiovascular system: S1 & S2 +, No JVD,. Gastrointestinal system: Abdomen soft, tender left lower quadrant more than yesterday,BS+ Nervous System:Alert, awake, moving extremities and grossly nonfocal Extremities: LE ankle edema neg, distal peripheral pulses palpable.  Skin: No rashes,no icterus. MSK: Normal muscle bulk,tone, power   Medications reviewed:  Scheduled Meds:  albuterol  2.5 mg Nebulization Once   calcium carbonate  1 tablet Oral Daily   enoxaparin (LOVENOX) injection  40 mg Subcutaneous Q59D   folic acid  638 mcg Oral Daily   guaiFENesin  600 mg Oral BID   predniSONE  2 mg Oral Q breakfast   rosuvastatin  10 mg Oral Daily  Continuous Infusions:  sodium chloride 50 mL/hr at 07/13/22 0814   piperacillin-tazobactam (ZOSYN)  IV 3.375 g (07/13/22 0817)    Diet Order             Diet NPO time specified Except for: Ice Chips, Sips with Meds  Diet effective now                  Intake/Output Summary (Last 24 hours) at 07/13/2022 1024 Last data filed at 07/12/2022 1400 Gross per 24 hour  Intake 118 ml  Output --  Net 118 ml   Net IO Since Admission: 1,018 mL [07/13/22 1024]  Wt Readings from Last 3 Encounters:  06/20/22 74.4 kg  05/01/22 75.4 kg  02/13/16 68 kg     Unresulted Labs (From admission, onward)     Start     Ordered   07/18/22 0500  Creatinine, serum  (enoxaparin (LOVENOX)    CrCl >/= 30 ml/min)  Weekly,   R     Comments: while on  enoxaparin therapy    07/11/22 1740   07/14/22 0500  CBC  Daily at 5am,   R     Question:  Specimen collection method  Answer:  Lab=Lab collect   07/13/22 0804   07/14/22 7564  Basic metabolic panel  Daily at 5am,   R     Question:  Specimen collection method  Answer:  Lab=Lab collect   07/13/22 0804          Data Reviewed: I have personally reviewed following labs and imaging studies CBC: Recent Labs  Lab 07/11/22 1446 07/12/22 0708 07/13/22 0625  WBC 11.1* 8.9 8.0  HGB 12.6 10.5* 10.5*  HCT 39.0 33.6* 34.0*  MCV 93.5 94.6 96.6  PLT 188 168 332   Basic Metabolic Panel: Recent Labs  Lab 07/11/22 1446 07/12/22 0708 07/13/22 0625  NA 139 141 139  K 3.6 3.3* 3.8  CL 107 110 111  CO2 '23 24 24  '$ GLUCOSE 101* 93 96  BUN 10 8 6*  CREATININE 1.08* 0.99 0.94  CALCIUM 9.1 8.0* 7.7*  MG  --  1.8  --    GFR: CrCl cannot be calculated (Unknown ideal weight.). Liver Function Tests: Recent Labs  Lab 07/11/22 1446 07/12/22 0708  AST 13* 9*  ALT 11 9  ALKPHOS 55 47  BILITOT 0.9 0.9  PROT 7.3 6.0*  ALBUMIN 3.9 3.1*   Recent Labs  Lab 07/11/22 1446  LIPASE 23   No results for input(s): "AMMONIA" in the last 168 hours. Coagulation Profile: No results for input(s): "INR", "PROTIME" in the last 168 hours. BNP (last 3 results) No results for input(s): "PROBNP" in the last 8760 hours. HbA1C: No results for input(s): "HGBA1C" in the last 72 hours. CBG: No results for input(s): "GLUCAP" in the last 168 hours. Lipid Profile: No results for input(s): "CHOL", "HDL", "LDLCALC", "TRIG", "CHOLHDL", "LDLDIRECT" in the last 72 hours. Thyroid Function Tests: No results for input(s): "TSH", "T4TOTAL", "FREET4", "T3FREE", "THYROIDAB" in the last 72 hours. Sepsis Labs: Recent Labs  Lab 07/11/22 1929  LATICACIDVEN 1.1    Recent Results (from the past 240 hour(s))  Urine Culture     Status: Abnormal   Collection Time: 07/11/22  2:46 PM   Specimen: Urine, Clean Catch   Result Value Ref Range Status   Specimen Description   Final    URINE, CLEAN CATCH Performed at [facility], [place] [address] [place], [place] [address]    Special Requests   Final    NONE Performed at [facility], [place] [address], [place], [place] [address]    Culture >=100,000 COLONIES/mL KLEBSIELLA PNEUMONIAE (A)  Final   Report Status 07/13/2022 FINAL  Final   Organism ID, Bacteria KLEBSIELLA PNEUMONIAE (A)  Final      Susceptibility   Klebsiella pneumoniae - MIC*    AMPICILLIN RESISTANT Resistant     CEFAZOLIN <=4 SENSITIVE Sensitive     CEFEPIME <=0.12 SENSITIVE Sensitive     CEFTRIAXONE <=0.25 SENSITIVE Sensitive     CIPROFLOXACIN <=0.25 SENSITIVE Sensitive     GENTAMICIN <=1 SENSITIVE Sensitive     IMIPENEM <=0.25 SENSITIVE Sensitive     NITROFURANTOIN 64 INTERMEDIATE Intermediate     TRIMETH/SULFA <=20 SENSITIVE Sensitive     AMPICILLIN/SULBACTAM 4 SENSITIVE Sensitive     PIP/TAZO <=4 SENSITIVE Sensitive     * >=100,000 COLONIES/mL KLEBSIELLA PNEUMONIAE    Antimicrobials: Anti-infectives (From admission, onward)    Start     Dose/Rate Route Frequency Ordered Stop   07/12/22 1800  cefTRIAXone (ROCEPHIN) 2 g in sodium chloride 0.9 % 100 mL IVPB  Status:  Discontinued        2 g 200 mL/hr over 30 Minutes Intravenous Every 24 hours 07/11/22 1759 07/12/22 0836   07/12/22 0900  piperacillin-tazobactam (ZOSYN) IVPB 3.375 g        3.375 g 12.5 mL/hr over 240 Minutes Intravenous Every 8 hours 07/12/22 0836     07/11/22 1800  cefTRIAXone (ROCEPHIN) 1 g in sodium chloride 0.9 % 100 mL IVPB        1 g 200 mL/hr over 30 Minutes Intravenous  Once 07/11/22 1759 07/11/22 2300   07/11/22 1730  metroNIDAZOLE (FLAGYL) IVPB 500 mg  Status:  Discontinued        500 mg 100 mL/hr over 60 Minutes Intravenous Every 12 hours 07/11/22 1726 07/12/22 0836   07/11/22 1545  cefTRIAXone (ROCEPHIN) 1 g in sodium chloride 0.9 % 100 mL IVPB        1 g 200  mL/hr over 30 Minutes Intravenous  Once 07/11/22 1540 07/11/22 1716      Culture/Microbiology    Component Value Date/Time   SDES  07/11/2022 1446    URINE, CLEAN CATCH Performed at [facility], [place] [address], [place], [place] [address]    SPECREQUEST  07/11/2022 1446    NONE Performed at [facility], [place] [address], [place], [place] [address]    CULT >=100,000 COLONIES/mL KLEBSIELLA PNEUMONIAE (A) 07/11/2022 1446   REPTSTATUS 07/13/2022 FINAL 07/11/2022 1446   Radiology Studies: DG CHEST PORT 1 VIEW  Result Date: 07/13/2022 CLINICAL DATA:  Shortness of breath, productive cough. EXAM: PORTABLE CHEST 1 VIEW COMPARISON:  CT scan of Mar 12, 2019.  Radiograph of June 05, 2013. FINDINGS: The heart size and mediastinal contours are within normal limits. Both lungs are clear. The visualized skeletal structures are unremarkable. IMPRESSION: No active disease. Electronically Signed   By: Marijo Conception M.D.   On: 07/13/2022 09:14   CT ABDOMEN PELVIS W CONTRAST  Result Date: 07/11/2022 CLINICAL DATA:  Left lower quadrant abdominal pain. EXAM: CT ABDOMEN AND PELVIS WITH CONTRAST TECHNIQUE: Multidetector CT imaging of the abdomen and pelvis was performed using the standard protocol following bolus administration of intravenous contrast. RADIATION DOSE REDUCTION: This exam was performed according to the departmental dose-optimization program which includes automated exposure control, adjustment of the mA and/or kV according to patient size and/or use of iterative reconstruction technique. CONTRAST:  45m OMNIPAQUE IOHEXOL 300 MG/ML  SOLN COMPARISON:  01/10/2019. FINDINGS: Lower chest: Chronic scarring at the left lateral lung base. Small hiatal hernia. No acute findings. Hepatobiliary: Liver normal in overall size attenuation. Stable left lobe hemangioma. Subcentimeter low-attenuation lesion, segment 7 consistent with a cyst. No other liver abnormalities.  Gallbladder is distended, otherwise unremarkable. No bile duct dilation. Pancreas: Unremarkable. No pancreatic ductal dilatation or surrounding inflammatory changes. Spleen: Normal in size without focal abnormality. Adrenals/Urinary Tract: Normal adrenal glands. Right mid to lower pole renal cortical scarring. 12 mm low-attenuation mass, lower pole the right kidney, stable consistent with a cyst. No follow-up recommended. No renal stones. No hydronephrosis. Ureters are normal in course and in caliber. Bladder is unremarkable. Stomach/Bowel: Mild wall thickening with adjacent inflammatory stranding and haziness along the proximal to mid sigmoid colon consistent with diverticulitis. Small fluid collection lies in the mesentery adjacent to the involved segment of sigmoid colon, 1.9 x 1.2 x 1.5 cm. No extraluminal or free air. Multiple diverticula are noted along the sigmoid colon, less prominently along the descending colon. There are no other areas of colonic inflammation. Stomach unremarkable other than the hiatal hernia. Normal small bowel. Normal appendix visualized. Vascular/Lymphatic: Minor aortic atherosclerosis. No aneurysm. No enlarged lymph nodes. Reproductive: Uterus and bilateral adnexa are unremarkable. Other: No abdominal wall hernia or abnormality. No abdominopelvic ascites. Musculoskeletal: No fracture or acute finding.  No bone lesion. IMPRESSION: 1. Acute sigmoid colon diverticulitis. Small peridiverticular abscess/fluid collection, 1.9 cm in greatest dimension. No other complicating features. 2. No other acute abnormality within the abdomen or pelvis. Electronically Signed   By: DLajean ManesM.D.   On: 07/11/2022 17:09     LOS: 1 day   RAntonieta Pert MD [facility]  07/13/2022, 10:24 AM

## 2022-07-13 NOTE — Progress Notes (Signed)
       CROSS COVER NOTE  NAME: Jenny Glass MRN: [id number] DOB : [birth year]    Date of Service   07/13/2022   HPI/Events of Note   Medication request received for patient report of wheezing. No hypoxia or tachypnea noted. On review of chart patient has a remote history of bronchitis documented in 2012-2014.  Interventions   Plan: Albuterol x1    This document was prepared using Dragon voice recognition software and may include unintentional dictation errors.  [name] DNP, MHA, FNP-BC Nurse Practitioner [facility] Pager [phone]

## 2022-07-14 DIAGNOSIS — K5720 Diverticulitis of large intestine with perforation and abscess without bleeding: Secondary | ICD-10-CM | POA: Diagnosis not present

## 2022-07-14 LAB — BASIC METABOLIC PANEL
Anion gap: 8 (ref 5–15)
BUN: 6 mg/dL — ABNORMAL LOW (ref 8–23)
CO2: 23 mmol/L (ref 22–32)
Calcium: 8.2 mg/dL — ABNORMAL LOW (ref 8.9–10.3)
Chloride: 108 mmol/L (ref 98–111)
Creatinine, Ser: 0.89 mg/dL (ref 0.44–1.00)
GFR, Estimated: >60 mL/min (ref >60)
Glucose, Bld: 92 mg/dL (ref 70–99)
Potassium: 3.6 mmol/L (ref 3.5–5.1)
Sodium: 139 mmol/L (ref 135–145)

## 2022-07-14 LAB — CBC
HCT: 33.1 % — ABNORMAL LOW (ref 36.0–46.0)
Hemoglobin: 10.4 g/dL — ABNORMAL LOW (ref 12.0–15.0)
MCH: 29.9 pg (ref 26.0–34.0)
MCHC: 31.4 g/dL (ref 30.0–36.0)
MCV: 95.1 fL (ref 80.0–100.0)
Platelets: 192 10*3/uL (ref 150–400)
RBC: 3.48 MIL/uL — ABNORMAL LOW (ref 3.87–5.11)
RDW: 14.9 % (ref 11.5–15.5)
WBC: 8.5 10*3/uL (ref 4.0–10.5)
nRBC: 0 % (ref 0.0–0.2)

## 2022-07-14 MED ORDER — WITCH HAZEL-GLYCERIN EX PADS
MEDICATED_PAD | CUTANEOUS | Status: DC | PRN
  Administered 2022-07-14: 1 via TOPICAL
  Filled 2022-07-14 (×2): qty 100

## 2022-07-14 MED ORDER — METRONIDAZOLE 500 MG/100ML IV SOLN: 500.0000 mg | Freq: Three times a day (TID) | INTRAVENOUS | Status: DC

## 2022-07-14 MED ORDER — CIPROFLOXACIN IN D5W 400 MG/200ML IV SOLN: 400.0000 mg | Freq: Two times a day (BID) | INTRAVENOUS | Status: DC

## 2022-07-14 MED ORDER — ERTAPENEM SODIUM 1 G IJ SOLR
1.0000 g | Freq: Every day | INTRAMUSCULAR | Status: DC
Start: 2022-07-14 — End: 2022-07-14
  Filled 2022-07-14: qty 1

## 2022-07-14 MED ORDER — SODIUM CHLORIDE 0.9 % IV SOLN
1.0000 g | Freq: Every day | INTRAVENOUS | Status: DC
  Administered 2022-07-14 – 2022-07-15 (×2): 1000 mg via INTRAVENOUS
  Administered 2022-07-16: 1 g via INTRAVENOUS
  Administered 2022-07-17 – 2022-07-25 (×9): 1000 mg via INTRAVENOUS
  Filled 2022-07-14 (×12): qty 1

## 2022-07-14 NOTE — Progress Notes (Signed)
PROGRESS NOTE Jenny Glass  QHU:[id number] DOB: 1951-09-09 DOA: 07/11/2022 PCP: Merrilee Seashore, MD   Brief Narrative/Hospital Course: 71 year old female with history of polymyalgia rheumatica, carotid artery stenosis left greater than right, hepatic hemangioma, hyperlipidemia, aortic atherosclerosis, hypertension, chronic back pain presented to the ED with abdominal pain left lower quadrant and suprapubic with nausea x3 days with associated chills, possible fever at home but no vomiting or diarrhea.  Seen at PCP office and instructed to come to the ED. In the ED vitals stable, underwent lab work that showed mild leukocytosis abnormal UA, CT abdomen pelvis with acute sigmoid colon diverticulitis small peridiverticular abscess/fluid collection 1.9 cm.  Patient was given LR 1 L, Rocephin 1 g, Flagyl, lactic acid ordered and admission requested.  Seen by surgery, antibiotic changed to Zosyn for complicated diverticulitis-being managed conservatively    Subjective: Seen and examined.  Complains of ongoing left lower quadrant abdominal pain but no fever chills WBC count remains stable Overnight afebrile Has been eating morphine and tramadol for pain  Assessment and Plan: Principal Problem:   Abscess of sigmoid colon due to diverticulitis Active Problems:   Hyperlipidemia   Chronic back pain   Hypertension   Carotid artery stenosis   UTI (urinary tract infection)   Diverticulitis of intestine with abscess   Acute complicated sigmoid diverticulitis with a small abscess 1.9 cm in CT:  Managing conservatively with Zosyn, IV fluids pain control and close monitoring surgery following.  Repeat CT abdomen-defer to surgery for timing> discussed surgery antibiotics being changed to Invanz.currently WBC count are stable and no fever.Last colonoscopy abt 8hr ago and was normal per her-discussed further need to have outpatient colonoscopy in 6 to 8 weeks follow-up.  Wheezing/shortness of breath  cxr 9/8-  any acute finding, on gentle IV fluid rate , encourage incentive spirometry, bronchodilators.  Hypokalemia resolved Klebsiella pneumoniae UTI : Antibiotic coverage as per #1  PMR on simponi inj q6-8 wk, methotrexate inj q wkly, and prednisone- cont steroid. Patient is immunocompromised status given chronic steroid use.  Patient is due for injection Simponi agents but will hold off due to patient's acute infection and will need to be reassessed by her rheumatologist if same to continue  Hyperlipidemia Hypertension Carotid artery stenosis: Stable, no chest pain, BP soft continue holding amlodipine and losartan. Cont crestor 10 mg,   Chronic back pain: New pain control.   DVT prophylaxis: enoxaparin (LOVENOX) injection 40 mg Start: 07/11/22 2200 Code Status:   Code Status: Full Code Family Communication: plan of care discussed with patient and her husband at bedside. Patient status is: Remains hospitalized for continued IV antibiotics for complicated diverticulitis Level of care: Med-Surg  Dispo: The patient is from: home            Anticipated disposition: home  Mobility Assessment (last 72 hours)     Mobility Assessment     Row Name 07/13/22 2006 07/13/22 0844 07/12/22 2017 07/12/22 1015 07/11/22 2300   Does patient have an order for bedrest or is patient medically unstable No - Continue assessment No - Continue assessment No - Continue assessment No - Continue assessment No - Continue assessment   What is the highest level of mobility based on the progressive mobility assessment? Level 6 (Walks independently in room and hall) - Balance while walking in room without assist - Complete Level 6 (Walks independently in room and hall) - Balance while walking in room without assist - Complete Level 6 (Walks independently in room and hall) - Balance  while walking in room without assist - Complete Level 6 (Walks independently in room and hall) - Balance while walking in room without assist -  Complete Level 5 (Walks with assist in room/hall) - Balance while stepping forward/back and can walk in room with assist - Complete          Objective: Vitals last 24 hrs: Vitals:   07/13/22 1348 07/13/22 1656 07/13/22 1941 07/14/22 0608  BP: 116/69  122/65 121/72  Pulse: 72 68 73 79  Resp: '16 18 18 18  '$ Temp: 97.6 F (36.4 C)  98.8 F (37.1 C)   TempSrc: Oral  Oral   SpO2: 95% 95% 100% 93%   Weight change:   Physical Examination: General exam: AAox3, older than stated age, weak appearing. HEENT:Oral mucosa moist, Ear/Nose WNL grossly, dentition normal. Respiratory system: bilaterally clear, no use of accessory muscle Cardiovascular system: S1 & S2 +, No JVD,. Gastrointestinal system: Abdomen soft LLQ is tender to touch Nervous System:Alert, awake, moving extremities and grossly nonfocal Extremities: LE ankle edema neg, distal peripheral pulses palpable.  Skin: No rashes,no icterus. MSK: Normal muscle bulk,tone, power   Medications reviewed:  Scheduled Meds:  albuterol  2.5 mg Nebulization Once   calcium carbonate  1 tablet Oral Daily   enoxaparin (LOVENOX) injection  40 mg Subcutaneous [id number]   folic acid  960 mcg Oral Daily   guaiFENesin  600 mg Oral BID   predniSONE  2 mg Oral Q breakfast   rosuvastatin  10 mg Oral Daily  Continuous Infusions:  sodium chloride 50 mL/hr at 07/13/22 0814   ertapenem 1,000 mg (07/14/22 1028)    Diet Order             Diet NPO time specified Except for: Ice Chips, Sips with Meds  Diet effective now                  Intake/Output Summary (Last 24 hours) at 07/14/2022 1108 Last data filed at 07/13/2022 1635 Gross per 24 hour  Intake --  Output 200 ml  Net -200 ml   Net IO Since Admission: 818 mL [07/14/22 1108]  Wt Readings from Last 3 Encounters:  06/20/22 74.4 kg  05/01/22 75.4 kg  02/13/16 68 kg     Unresulted Labs (From admission, onward)     Start     Ordered   07/18/22 0500  Creatinine, serum  (enoxaparin (LOVENOX)     CrCl >/= 30 ml/min)  Weekly,   R     Comments: while on enoxaparin therapy    07/11/22 1740   07/14/22 0500  CBC  Daily at 5am,   R     Question:  Specimen collection method  Answer:  Lab=Lab collect   07/13/22 0804   07/14/22 4540  Basic metabolic panel  Daily at 5am,   R     Question:  Specimen collection method  Answer:  Lab=Lab collect   07/13/22 0804          Data Reviewed: I have personally reviewed following labs and imaging studies CBC: Recent Labs  Lab 07/11/22 1446 07/12/22 0708 07/13/22 0625 07/14/22 0751  WBC 11.1* 8.9 8.0 8.5  HGB 12.6 10.5* 10.5* 10.4*  HCT 39.0 33.6* 34.0* 33.1*  MCV 93.5 94.6 96.6 95.1  PLT 188 168 176 981   Basic Metabolic Panel: Recent Labs  Lab 07/11/22 1446 07/12/22 0708 07/13/22 0625 07/14/22 0751  NA 139 141 139 139  K 3.6 3.3* 3.8 3.6  CL 107  110 111 108  CO2 '23 24 24 23  '$ GLUCOSE 101* 93 96 92  BUN 10 8 6* 6*  CREATININE 1.08* 0.99 0.94 0.89  CALCIUM 9.1 8.0* 7.7* 8.2*  MG  --  1.8  --   --    GFR: CrCl cannot be calculated (Unknown ideal weight.). Liver Function Tests: Recent Labs  Lab 07/11/22 1446 07/12/22 0708  AST 13* 9*  ALT 11 9  ALKPHOS 55 47  BILITOT 0.9 0.9  PROT 7.3 6.0*  ALBUMIN 3.9 3.1*   Recent Labs  Lab 07/11/22 1446  LIPASE 23   No results for input(s): "AMMONIA" in the last 168 hours. Coagulation Profile: No results for input(s): "INR", "PROTIME" in the last 168 hours. BNP (last 3 results) No results for input(s): "PROBNP" in the last 8760 hours. HbA1C: No results for input(s): "HGBA1C" in the last 72 hours. CBG: No results for input(s): "GLUCAP" in the last 168 hours. Lipid Profile: No results for input(s): "CHOL", "HDL", "LDLCALC", "TRIG", "CHOLHDL", "LDLDIRECT" in the last 72 hours. Thyroid Function Tests: No results for input(s): "TSH", "T4TOTAL", "FREET4", "T3FREE", "THYROIDAB" in the last 72 hours. Sepsis Labs: Recent Labs  Lab 07/11/22 1929  LATICACIDVEN 1.1     Recent Results (from the past 240 hour(s))  Urine Culture     Status: Abnormal   Collection Time: 07/11/22  2:46 PM   Specimen: Urine, Clean Catch  Result Value Ref Range Status   Specimen Description   Final    URINE, CLEAN CATCH Performed at [facility], [place] [address], [place], [place] [address]    Special Requests   Final    NONE Performed at [facility], [place] [address] [place], [place] [address]    Culture >=100,000 COLONIES/mL KLEBSIELLA PNEUMONIAE (A)  Final   Report Status 07/13/2022 FINAL  Final   Organism ID, Bacteria KLEBSIELLA PNEUMONIAE (A)  Final      Susceptibility   Klebsiella pneumoniae - MIC*    AMPICILLIN RESISTANT Resistant     CEFAZOLIN <=4 SENSITIVE Sensitive     CEFEPIME <=0.12 SENSITIVE Sensitive     CEFTRIAXONE <=0.25 SENSITIVE Sensitive     CIPROFLOXACIN <=0.25 SENSITIVE Sensitive     GENTAMICIN <=1 SENSITIVE Sensitive     IMIPENEM <=0.25 SENSITIVE Sensitive     NITROFURANTOIN 64 INTERMEDIATE Intermediate     TRIMETH/SULFA <=20 SENSITIVE Sensitive     AMPICILLIN/SULBACTAM 4 SENSITIVE Sensitive     PIP/TAZO <=4 SENSITIVE Sensitive     * >=100,000 COLONIES/mL KLEBSIELLA PNEUMONIAE    Antimicrobials: Anti-infectives (From admission, onward)    Start     Dose/Rate Route Frequency Ordered Stop   07/14/22 1100  ertapenem [facility]) injection 1,000 mg  Status:  Discontinued        1 g Intramuscular Daily 07/14/22 0935 07/14/22 1000   07/14/22 1100  ertapenem (INVANZ) 1,000 mg in sodium chloride 0.9 % 100 mL IVPB        1 g 200 mL/hr over 30 Minutes Intravenous Daily 07/14/22 1002     07/14/22 1030  ciprofloxacin (CIPRO) IVPB 400 mg  Status:  Discontinued        400 mg 200 mL/hr over 60 Minutes Intravenous Every 12 hours 07/14/22 0934 07/14/22 0935   07/14/22 1030  metroNIDAZOLE (FLAGYL) IVPB 500 mg  Status:  Discontinued        500 mg 100 mL/hr over 60 Minutes Intravenous Every 8 hours 07/14/22 0934 07/14/22  0935   07/12/22 1800  cefTRIAXone (  ROCEPHIN) 2 g in sodium chloride 0.9 % 100 mL IVPB  Status:  Discontinued        2 g 200 mL/hr over 30 Minutes Intravenous Every 24 hours 07/11/22 1759 07/12/22 0836   07/12/22 0900  piperacillin-tazobactam (ZOSYN) IVPB 3.375 g  Status:  Discontinued        3.375 g 12.5 mL/hr over 240 Minutes Intravenous Every 8 hours 07/12/22 0836 07/14/22 0934   07/11/22 1800  cefTRIAXone (ROCEPHIN) 1 g in sodium chloride 0.9 % 100 mL IVPB        1 g 200 mL/hr over 30 Minutes Intravenous  Once 07/11/22 1759 07/11/22 2300   07/11/22 1730  metroNIDAZOLE (FLAGYL) IVPB 500 mg  Status:  Discontinued        500 mg 100 mL/hr over 60 Minutes Intravenous Every 12 hours 07/11/22 1726 07/12/22 0836   07/11/22 1545  cefTRIAXone (ROCEPHIN) 1 g in sodium chloride 0.9 % 100 mL IVPB        1 g 200 mL/hr over 30 Minutes Intravenous  Once 07/11/22 1540 07/11/22 1716      Culture/Microbiology    Component Value Date/Time   SDES  07/11/2022 1446    URINE, CLEAN CATCH Performed at [facility], [place] [address], [place], [place] [address]    SPECREQUEST  07/11/2022 1446    NONE Performed at [facility], [place] [address] [place], [place] [address]    CULT >=100,000 COLONIES/mL KLEBSIELLA PNEUMONIAE (A) 07/11/2022 1446   REPTSTATUS 07/13/2022 FINAL 07/11/2022 1446   Radiology Studies: DG CHEST PORT 1 VIEW  Result Date: 07/13/2022 CLINICAL DATA:  Shortness of breath, productive cough. EXAM: PORTABLE CHEST 1 VIEW COMPARISON:  CT scan of Mar 12, 2019.  Radiograph of June 05, 2013. FINDINGS: The heart size and mediastinal contours are within normal limits. Both lungs are clear. The visualized skeletal structures are unremarkable. IMPRESSION: No active disease. Electronically Signed   By: Marijo Conception M.D.   On: 07/13/2022 09:14     LOS: 2 days   Antonieta Pert, MD [facility]  07/14/2022, 11:08 AM

## 2022-07-14 NOTE — Plan of Care (Signed)
  Problem: Education: Goal: Knowledge of General Education information will improve Description: Including pain rating scale, medication(s)/side effects and non-pharmacologic comfort measures Outcome: Progressing   Problem: Health Behavior/Discharge Planning: Goal: Ability to manage health-related needs will improve Outcome: Progressing   Problem: Clinical Measurements: Goal: Ability to maintain clinical measurements within normal limits will improve Outcome: Progressing Goal: Will remain free from infection Outcome: Progressing Goal: Diagnostic test results will improve Outcome: Progressing Goal: Respiratory complications will improve Outcome: Progressing Goal: Cardiovascular complication will be avoided Outcome: Progressing   Problem: Activity: Goal: Risk for activity intolerance will decrease Outcome: Progressing   Problem: Nutrition: Goal: Adequate nutrition will be maintained Outcome: Progressing   Problem: Coping: Goal: Level of anxiety will decrease Outcome: Progressing   Problem: Elimination: Goal: Will not experience complications related to bowel motility Outcome: Progressing Goal: Will not experience complications related to urinary retention Outcome: Progressing   Problem: Skin Integrity: Goal: Risk for impaired skin integrity will decrease Outcome: Progressing   Problem: Education: Goal: Knowledge of General Education information will improve Description: Including pain rating scale, medication(s)/side effects and non-pharmacologic comfort measures Outcome: Progressing   Problem: Clinical Measurements: Goal: Ability to maintain clinical measurements within normal limits will improve Outcome: Progressing Goal: Will remain free from infection Outcome: Progressing Goal: Diagnostic test results will improve Outcome: Progressing Goal: Respiratory complications will improve Outcome: Progressing Goal: Cardiovascular complication will be avoided Outcome:  Progressing   Problem: Activity: Goal: Risk for activity intolerance will decrease Outcome: Progressing

## 2022-07-14 NOTE — Progress Notes (Signed)
Mobility Specialist - Progress Note   07/14/22 1218  Mobility  HOB Elevated/Bed Position Self regulated  Activity Ambulated with assistance in hallway  Range of Motion/Exercises Active  Level of Assistance Independent after set-up  Assistive Device  (IV Pole)  Distance Ambulated (ft) 175 ft  Activity Response Tolerated well  Transport method Ambulatory  $Mobility charge 1 Mobility   Pt received in bed and agreeable to mobility. Assisted pt to bathroom prior to ambulation.  Pt to bed after session with all needs met.    [facility]

## 2022-07-14 NOTE — Progress Notes (Signed)
[facility] Progress Note     Subjective: CC-  Pt has continued pain with "maybe very slight improvement."  She still is having diarrhea and fecal urgency/incontinence.  She hasn't had any pain meds today but feels poorly this AM.    Objective: Vital signs in last 24 hours: Temp:  [97.6 F (36.4 C)-98.8 F (37.1 C)] 98.8 F (37.1 C) (09/08 1941) Pulse Rate:  [68-79] 79 (09/09 0608) Resp:  [16-18] 18 (09/09 0608) BP: (116-122)/(65-72) 121/72 (09/09 0608) SpO2:  [93 %-100 %] 93 % (09/09 0608) Last BM Date : 07/14/22  Intake/Output from previous day: 09/08 0701 - 09/09 0700 In: -  Out: 200 [Urine:200] Intake/Output this shift: No intake/output data recorded.  PE: Gen:  Alert, NAD, pleasant Cardio: RRR Pulm: CTAB, no wheezing, rate and effort normal  Abd: soft, ND, TTP mainly suprapubic location. No guarding or rebound.  Lab Results:  Recent Labs    07/13/22 0625 07/14/22 0751  WBC 8.0 8.5  HGB 10.5* 10.4*  HCT 34.0* 33.1*  PLT 176 192   BMET Recent Labs    07/13/22 0625 07/14/22 0751  NA 139 139  K 3.8 3.6  CL 111 108  CO2 24 23  GLUCOSE 96 92  BUN 6* 6*  CREATININE 0.94 0.89  CALCIUM 7.7* 8.2*   PT/INR No results for input(s): "LABPROT", "INR" in the last 72 hours. CMP     Component Value Date/Time   NA 139 07/14/2022 0751   K 3.6 07/14/2022 0751   CL 108 07/14/2022 0751   CO2 23 07/14/2022 0751   GLUCOSE 92 07/14/2022 0751   BUN 6 (L) 07/14/2022 0751   CREATININE 0.89 07/14/2022 0751   CREATININE 1.00 02/16/2012 1509   CALCIUM 8.2 (L) 07/14/2022 0751   PROT 6.0 (L) 07/12/2022 0708   ALBUMIN 3.1 (L) 07/12/2022 0708   AST 9 (L) 07/12/2022 0708   ALT 9 07/12/2022 0708   ALKPHOS 47 07/12/2022 0708   BILITOT 0.9 07/12/2022 0708   GFRNONAA >60 07/14/2022 0751   GFRAA 46 (L) 01/10/2019 2103   Lipase     Component Value Date/Time   LIPASE 23 07/11/2022 1446       Studies/Results: DG CHEST PORT 1 VIEW  Result Date:  07/13/2022 CLINICAL DATA:  Shortness of breath, productive cough. EXAM: PORTABLE CHEST 1 VIEW COMPARISON:  CT scan of Mar 12, 2019.  Radiograph of June 05, 2013. FINDINGS: The heart size and mediastinal contours are within normal limits. Both lungs are clear. The visualized skeletal structures are unremarkable. IMPRESSION: No active disease. Electronically Signed   By: Marijo Conception M.D.   On: 07/13/2022 09:14    Anti-infectives: Anti-infectives (From admission, onward)    Start     Dose/Rate Route Frequency Ordered Stop   07/14/22 1030  ciprofloxacin (CIPRO) IVPB 400 mg        400 mg 200 mL/hr over 60 Minutes Intravenous Every 12 hours 07/14/22 0934     07/14/22 1030  metroNIDAZOLE (FLAGYL) IVPB 500 mg        500 mg 100 mL/hr over 60 Minutes Intravenous Every 8 hours 07/14/22 0934     07/12/22 1800  cefTRIAXone (ROCEPHIN) 2 g in sodium chloride 0.9 % 100 mL IVPB  Status:  Discontinued        2 g 200 mL/hr over 30 Minutes Intravenous Every 24 hours 07/11/22 1759 07/12/22 0836   07/12/22 0900  piperacillin-tazobactam (ZOSYN) IVPB 3.375 g  Status:  Discontinued  3.375 g 12.5 mL/hr over 240 Minutes Intravenous Every 8 hours 07/12/22 0836 07/14/22 0934   07/11/22 1800  cefTRIAXone (ROCEPHIN) 1 g in sodium chloride 0.9 % 100 mL IVPB        1 g 200 mL/hr over 30 Minutes Intravenous  Once 07/11/22 1759 07/11/22 2300   07/11/22 1730  metroNIDAZOLE (FLAGYL) IVPB 500 mg  Status:  Discontinued        500 mg 100 mL/hr over 60 Minutes Intravenous Every 12 hours 07/11/22 1726 07/12/22 0836   07/11/22 1545  cefTRIAXone (ROCEPHIN) 1 g in sodium chloride 0.9 % 100 mL IVPB        1 g 200 mL/hr over 30 Minutes Intravenous  Once 07/11/22 1540 07/11/22 1716        Assessment/Plan Diverticulitis with small abscess - 1st bout, last colonoscopy 2013 - CT scan 9/6 shows Acute sigmoid colon diverticulitis with small peridiverticular abscess/fluid collection, 1.9 cm in greatest dimension - Worsening  abdominal pain and new onset pneumaturia, 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 Continue NPO.  Has already switched from rocephin/flagyl to zosyn.  Will switch to ertapenem.  If no change, likely CT tomorrow vs Monday.     ID - rocephin/flagyl 9/6>>9/7, zosyn 9/7>>ertapenem 9/9 FEN - IVF, ice chips/sips VTE - SCDs, lovenox Foley - none   Klebsiella pneumoniae UTI - should also be covered by ertapenem.  PMR on simponi HTN HLD CAD Chronic back pain   I reviewed last 24 h vitals and pain scores, last 48 h intake and output, last 24 h labs and trends. Discussed with hospitalist.      LOS: 2 days   Milus Height, MD FACS Surgical Oncology, General Surgery, Trauma and [facility], [place] for weekday/non holidays Check [email or web address] for coverage night/weekend/holidays  Do not use SecureChat as it is not reliable for timely patient care.

## 2022-07-15 ENCOUNTER — Inpatient Hospital Stay (HOSPITAL_COMMUNITY): Payer: Medicare Other

## 2022-07-15 DIAGNOSIS — K5720 Diverticulitis of large intestine with perforation and abscess without bleeding: Secondary | ICD-10-CM | POA: Diagnosis not present

## 2022-07-15 LAB — BASIC METABOLIC PANEL
Anion gap: 8 (ref 5–15)
BUN: 7 mg/dL — ABNORMAL LOW (ref 8–23)
CO2: 23 mmol/L (ref 22–32)
Calcium: 8.2 mg/dL — ABNORMAL LOW (ref 8.9–10.3)
Chloride: 111 mmol/L (ref 98–111)
Creatinine, Ser: 0.82 mg/dL (ref 0.44–1.00)
GFR, Estimated: >60 mL/min (ref >60)
Glucose, Bld: 71 mg/dL (ref 70–99)
Potassium: 3.7 mmol/L (ref 3.5–5.1)
Sodium: 142 mmol/L (ref 135–145)

## 2022-07-15 LAB — CBC
HCT: 32.6 % — ABNORMAL LOW (ref 36.0–46.0)
Hemoglobin: 10.1 g/dL — ABNORMAL LOW (ref 12.0–15.0)
MCH: 29.9 pg (ref 26.0–34.0)
MCHC: 31 g/dL (ref 30.0–36.0)
MCV: 96.4 fL (ref 80.0–100.0)
Platelets: 216 10*3/uL (ref 150–400)
RBC: 3.38 MIL/uL — ABNORMAL LOW (ref 3.87–5.11)
RDW: 14.5 % (ref 11.5–15.5)
WBC: 6.4 10*3/uL (ref 4.0–10.5)
nRBC: 0 % (ref 0.0–0.2)

## 2022-07-15 MED ORDER — IOHEXOL 300 MG/ML  SOLN
100.0000 mL | Freq: Once | INTRAMUSCULAR | Status: AC | PRN
  Administered 2022-07-15: 100 mL via INTRAVENOUS

## 2022-07-15 MED ORDER — IOHEXOL 9 MG/ML PO SOLN
ORAL | Status: AC
  Administered 2022-07-15: 500 mL
  Filled 2022-07-15: qty 1000

## 2022-07-15 MED ORDER — SODIUM CHLORIDE (PF) 0.9 % IJ SOLN
INTRAMUSCULAR | Status: AC
  Filled 2022-07-15: qty 50

## 2022-07-15 NOTE — Progress Notes (Signed)
Mobility Specialist - Progress Note   07/15/22 1435  Mobility  Activity Refused mobility   Mobility Specialist Cancellation/Refusal Note:   Reason for Cancellation/Refusal: Pt declined mobility at this time. Pt feels like she cannot control BM at this time. Will check back as schedule permits.     [facility]

## 2022-07-15 NOTE — Progress Notes (Signed)
PROGRESS NOTE Jenny Glass  XAJ:[id number] DOB: Feb 09, 1951 DOA: 07/11/2022 PCP: Merrilee Seashore, MD   Brief Narrative/Hospital Course: 71 year old female with history of polymyalgia rheumatica, carotid artery stenosis left greater than right, hepatic hemangioma, hyperlipidemia, aortic atherosclerosis, hypertension, chronic back pain presented to the ED with abdominal pain left lower quadrant and suprapubic with nausea x3 days with associated chills, possible fever at home but no vomiting or diarrhea.  Seen at PCP office and instructed to come to the ED. In the ED vitals stable, underwent lab work that showed mild leukocytosis abnormal UA, CT abdomen pelvis with acute sigmoid colon diverticulitis small peridiverticular abscess/fluid collection 1.9 cm.  Patient was given LR 1 L, Rocephin 1 g, Flagyl, lactic acid ordered and admission requested.  Seen by surgery, antibiotic changed to Zosyn for complicated diverticulitis-being managed conservatively    Subjective: Seen and examined this morning.  Complains of headache  No nausea vomiting no other new complaints  Reports she had a rough night  Has been afebrile and WBC count normal Assessment and Plan: Principal Problem:   Abscess of sigmoid colon due to diverticulitis Active Problems:   Hyperlipidemia   Chronic back pain   Hypertension   Carotid artery stenosis   UTI (urinary tract infection)   Diverticulitis of intestine with abscess   Acute complicated sigmoid diverticulitis with a small abscess 1.9 cm in CT:  Patient with ongoing left abdominal pain Zosyn> changed to Invanz 9/9 per surgery.  Continue plan of care as per surgery, remains afebrile wbc count stable and appears clinically stable although still has tenderness left lower quadrant-continue plan as per general surgery.  Continue pain control.Last colonoscopy abt 8hr ago and was normal per her-discussed further need to have outpatient colonoscopy in 6 to 8 weeks  follow-up.  Wheezing/shortness of breath  cxr 9/8- any acute finding, on gentle IV fluid rate , encourage incentive spirometry, bronchodilators.  Hypokalemia resolved Klebsiella pneumoniae UTI : Antibiotic coverage as per #1  PMR on simponi inj q6-8 wk, methotrexate inj q wkly, and prednisone- cont steroid. Patient is immunocompromised status given chronic steroid use.  Patient is due for injection Simponi agents but will hold off due to patient's acute infection and will need to be reassessed by her rheumatologist if same to continue  Headache:continue Tylenol prn Hyperlipidemia Hypertension Carotid artery stenosis: Stable no chest pain.BP stable continue to hold amlodipine and losartan. Cont crestor 10 mg,   Chronic back pain: cont pain control.   DVT prophylaxis: enoxaparin (LOVENOX) injection 40 mg Start: 07/11/22 2200 Code Status:   Code Status: Full Code Family Communication: plan of care discussed with patient and her husband at bedside. Patient status is: Remains hospitalized for continued IV antibiotics for complicated diverticulitis Level of care: Med-Surg  Dispo: The patient is from: home            Anticipated disposition: home  Mobility Assessment (last 72 hours)     Mobility Assessment     Row Name 07/14/22 2326 07/13/22 2006 07/13/22 0844 07/12/22 2017 07/12/22 1015   Does patient have an order for bedrest or is patient medically unstable No - Continue assessment No - Continue assessment No - Continue assessment No - Continue assessment No - Continue assessment   What is the highest level of mobility based on the progressive mobility assessment? Level 6 (Walks independently in room and hall) - Balance while walking in room without assist - Complete Level 6 (Walks independently in room and hall) - Balance while walking in  room without assist - Complete Level 6 (Walks independently in room and hall) - Balance while walking in room without assist - Complete Level 6 (Walks  independently in room and hall) - Balance while walking in room without assist - Complete Level 6 (Walks independently in room and hall) - Balance while walking in room without assist - Complete          Objective: Vitals last 24 hrs: Vitals:   07/13/22 1941 07/14/22 0608 07/14/22 2027 07/15/22 0508  BP: 122/65 121/72 (!) 144/80 125/74  Pulse: 73 79 75 71  Resp: '18 18 18 16  '$ Temp: 98.8 F (37.1 C)  99.3 F (37.4 C) 98 F (36.7 C)  TempSrc: Oral  Oral Oral  SpO2: 100% 93% 99% 100%   Weight change:   Physical Examination: General exam: AAox3, older than stated age, weak appearing. HEENT:Oral mucosa moist, Ear/Nose WNL grossly, dentition normal. Respiratory system: bilaterally diminished, no use of accessory muscle Cardiovascular system: S1 & S2 +, No JVD,. Gastrointestinal system: Abdomen soft LLQ is mildy tender but soft Nervous System:Alert, awake, moving extremities and grossly nonfocal Extremities: LE ankle edema neg, distal peripheral pulses palpable.  Skin: No rashes,no icterus. MSK: Normal muscle bulk,tone, power   Medications reviewed:  Scheduled Meds:  albuterol  2.5 mg Nebulization Once   calcium carbonate  1 tablet Oral Daily   enoxaparin (LOVENOX) injection  40 mg Subcutaneous K09F   folic acid  818 mcg Oral Daily   guaiFENesin  600 mg Oral BID   predniSONE  2 mg Oral Q breakfast   rosuvastatin  10 mg Oral Daily  Continuous Infusions:  sodium chloride 50 mL/hr at 07/14/22 2233   ertapenem 1,000 mg (07/14/22 1028)    Diet Order             Diet NPO time specified Except for: Ice Chips, Sips with Meds  Diet effective now                  Intake/Output Summary (Last 24 hours) at 07/15/2022 0832 Last data filed at 07/15/2022 0813 Gross per 24 hour  Intake 620 ml  Output 500 ml  Net 120 ml    Net IO Since Admission: 938 mL [07/15/22 0832]  Wt Readings from Last 3 Encounters:  06/20/22 74.4 kg  05/01/22 75.4 kg  02/13/16 68 kg     Unresulted  Labs (From admission, onward)     Start     Ordered   07/18/22 0500  Creatinine, serum  (enoxaparin (LOVENOX)    CrCl >/= 30 ml/min)  Weekly,   R     Comments: while on enoxaparin therapy    07/11/22 1740   07/14/22 0500  CBC  Daily at 5am,   R     Question:  Specimen collection method  Answer:  Lab=Lab collect   07/13/22 0804   07/14/22 2993  Basic metabolic panel  Daily at 5am,   R     Question:  Specimen collection method  Answer:  Lab=Lab collect   07/13/22 0804          Data Reviewed: I have personally reviewed following labs and imaging studies CBC: Recent Labs  Lab 07/11/22 1446 07/12/22 0708 07/13/22 0625 07/14/22 0751 07/15/22 0605  WBC 11.1* 8.9 8.0 8.5 6.4  HGB 12.6 10.5* 10.5* 10.4* 10.1*  HCT 39.0 33.6* 34.0* 33.1* 32.6*  MCV 93.5 94.6 96.6 95.1 96.4  PLT 188 168 176 192 716    Basic Metabolic Panel:  Recent Labs  Lab 07/11/22 1446 07/12/22 0708 07/13/22 0625 07/14/22 0751 07/15/22 0605  NA 139 141 139 139 142  K 3.6 3.3* 3.8 3.6 3.7  CL 107 110 111 108 111  CO2 '23 24 24 23 23  '$ GLUCOSE 101* 93 96 92 71  BUN 10 8 6* 6* 7*  CREATININE 1.08* 0.99 0.94 0.89 0.82  CALCIUM 9.1 8.0* 7.7* 8.2* 8.2*  MG  --  1.8  --   --   --     GFR: CrCl cannot be calculated (Unknown ideal weight.). Liver Function Tests: Recent Labs  Lab 07/11/22 1446 07/12/22 0708  AST 13* 9*  ALT 11 9  ALKPHOS 55 47  BILITOT 0.9 0.9  PROT 7.3 6.0*  ALBUMIN 3.9 3.1*     Recent Results (from the past 240 hour(s))  Urine Culture     Status: Abnormal   Collection Time: 07/11/22  2:46 PM   Specimen: Urine, Clean Catch  Result Value Ref Range Status   Specimen Description   Final    URINE, CLEAN CATCH Performed at [facility], [place] [address] [place], [place] [address]    Special Requests   Final    NONE Performed at [facility], [place] [address], [place], [place] [address]    Culture >=100,000 COLONIES/mL KLEBSIELLA PNEUMONIAE  (A)  Final   Report Status 07/13/2022 FINAL  Final   Organism ID, Bacteria KLEBSIELLA PNEUMONIAE (A)  Final      Susceptibility   Klebsiella pneumoniae - MIC*    AMPICILLIN RESISTANT Resistant     CEFAZOLIN <=4 SENSITIVE Sensitive     CEFEPIME <=0.12 SENSITIVE Sensitive     CEFTRIAXONE <=0.25 SENSITIVE Sensitive     CIPROFLOXACIN <=0.25 SENSITIVE Sensitive     GENTAMICIN <=1 SENSITIVE Sensitive     IMIPENEM <=0.25 SENSITIVE Sensitive     NITROFURANTOIN 64 INTERMEDIATE Intermediate     TRIMETH/SULFA <=20 SENSITIVE Sensitive     AMPICILLIN/SULBACTAM 4 SENSITIVE Sensitive     PIP/TAZO <=4 SENSITIVE Sensitive     * >=100,000 COLONIES/mL KLEBSIELLA PNEUMONIAE    Antimicrobials: Anti-infectives (From admission, onward)    Start     Dose/Rate Route Frequency Ordered Stop   07/14/22 1100  ertapenem [facility]) injection 1,000 mg  Status:  Discontinued        1 g Intramuscular Daily 07/14/22 0935 07/14/22 1000   07/14/22 1100  ertapenem (INVANZ) 1,000 mg in sodium chloride 0.9 % 100 mL IVPB        1 g 200 mL/hr over 30 Minutes Intravenous Daily 07/14/22 1002     07/14/22 1030  ciprofloxacin (CIPRO) IVPB 400 mg  Status:  Discontinued        400 mg 200 mL/hr over 60 Minutes Intravenous Every 12 hours 07/14/22 0934 07/14/22 0935   07/14/22 1030  metroNIDAZOLE (FLAGYL) IVPB 500 mg  Status:  Discontinued        500 mg 100 mL/hr over 60 Minutes Intravenous Every 8 hours 07/14/22 0934 07/14/22 0935   07/12/22 1800  cefTRIAXone (ROCEPHIN) 2 g in sodium chloride 0.9 % 100 mL IVPB  Status:  Discontinued        2 g 200 mL/hr over 30 Minutes Intravenous Every 24 hours 07/11/22 1759 07/12/22 0836   07/12/22 0900  piperacillin-tazobactam (ZOSYN) IVPB 3.375 g  Status:  Discontinued        3.375 g 12.5 mL/hr over 240 Minutes Intravenous Every 8 hours 07/12/22 0836 07/14/22 0934   07/11/22  1800  cefTRIAXone (ROCEPHIN) 1 g in sodium chloride 0.9 % 100 mL IVPB        1 g 200 mL/hr over 30 Minutes  Intravenous  Once 07/11/22 1759 07/11/22 2300   07/11/22 1730  metroNIDAZOLE (FLAGYL) IVPB 500 mg  Status:  Discontinued        500 mg 100 mL/hr over 60 Minutes Intravenous Every 12 hours 07/11/22 1726 07/12/22 0836   07/11/22 1545  cefTRIAXone (ROCEPHIN) 1 g in sodium chloride 0.9 % 100 mL IVPB        1 g 200 mL/hr over 30 Minutes Intravenous  Once 07/11/22 1540 07/11/22 1716      Culture/Microbiology    Component Value Date/Time   SDES  07/11/2022 1446    URINE, CLEAN CATCH Performed at [facility], [place] [address], [place], [place] [address]    SPECREQUEST  07/11/2022 1446    NONE Performed at [facility], [place] [address], [place], [place] [address]    CULT >=100,000 COLONIES/mL KLEBSIELLA PNEUMONIAE (A) 07/11/2022 1446   REPTSTATUS 07/13/2022 FINAL 07/11/2022 1446   Radiology Studies: DG CHEST PORT 1 VIEW  Result Date: 07/13/2022 CLINICAL DATA:  Shortness of breath, productive cough. EXAM: PORTABLE CHEST 1 VIEW COMPARISON:  CT scan of Mar 12, 2019.  Radiograph of June 05, 2013. FINDINGS: The heart size and mediastinal contours are within normal limits. Both lungs are clear. The visualized skeletal structures are unremarkable. IMPRESSION: No active disease. Electronically Signed   By: Marijo Conception M.D.   On: 07/13/2022 09:14     LOS: 3 days   Antonieta Pert, MD [facility]  07/15/2022, 8:32 AM

## 2022-07-15 NOTE — Progress Notes (Signed)
[facility] Progress Note     Subjective: CC-  Pt had another "rough night," however she got a bedside commode with helped significantly with managing her diarrhea.  She feels like she has "a pocket of blowing air" in her LLQ.  Denies pneumaturia.   Objective: Vital signs in last 24 hours: Temp:  [98 F (36.7 C)-99.3 F (37.4 C)] 98 F (36.7 C) (09/10 0508) Pulse Rate:  [71-75] 71 (09/10 0508) Resp:  [16-18] 16 (09/10 0508) BP: (125-144)/(74-80) 125/74 (09/10 0508) SpO2:  [99 %-100 %] 100 % (09/10 0508) Last BM Date : 07/15/22  Intake/Output from previous day: 09/09 0701 - 09/10 0700 In: 600 [I.V.:500; IV Piggyback:100] Out: 500 [Urine:500] Intake/Output this shift: Total I/O In: 20 [P.O.:20] Out: -   PE: Gen:  Alert, NAD, pleasant Looks overall less fatigued Cardio: RRR Pulm: CTAB, no wheezing, rate and effort normal  Abd: soft, ND, mildly TTP LLQ. No guarding or rebound. Less tender than yesterday  Lab Results:  Recent Labs    07/14/22 0751 07/15/22 0605  WBC 8.5 6.4  HGB 10.4* 10.1*  HCT 33.1* 32.6*  PLT 192 216   BMET Recent Labs    07/14/22 0751 07/15/22 0605  NA 139 142  K 3.6 3.7  CL 108 111  CO2 23 23  GLUCOSE 92 71  BUN 6* 7*  CREATININE 0.89 0.82  CALCIUM 8.2* 8.2*   PT/INR No results for input(s): "LABPROT", "INR" in the last 72 hours. CMP     Component Value Date/Time   NA 142 07/15/2022 0605   K 3.7 07/15/2022 0605   CL 111 07/15/2022 0605   CO2 23 07/15/2022 0605   GLUCOSE 71 07/15/2022 0605   BUN 7 (L) 07/15/2022 0605   CREATININE 0.82 07/15/2022 0605   CREATININE 1.00 02/16/2012 1509   CALCIUM 8.2 (L) 07/15/2022 0605   PROT 6.0 (L) 07/12/2022 0708   ALBUMIN 3.1 (L) 07/12/2022 0708   AST 9 (L) 07/12/2022 0708   ALT 9 07/12/2022 0708   ALKPHOS 47 07/12/2022 0708   BILITOT 0.9 07/12/2022 0708   GFRNONAA >60 07/15/2022 0605   GFRAA 46 (L) 01/10/2019 2103   Lipase     Component Value Date/Time   LIPASE 23  07/11/2022 1446       Studies/Results: DG CHEST PORT 1 VIEW  Result Date: 07/13/2022 CLINICAL DATA:  Shortness of breath, productive cough. EXAM: PORTABLE CHEST 1 VIEW COMPARISON:  CT scan of Mar 12, 2019.  Radiograph of June 05, 2013. FINDINGS: The heart size and mediastinal contours are within normal limits. Both lungs are clear. The visualized skeletal structures are unremarkable. IMPRESSION: No active disease. Electronically Signed   By: Marijo Conception M.D.   On: 07/13/2022 09:14    Anti-infectives: Anti-infectives (From admission, onward)    Start     Dose/Rate Route Frequency Ordered Stop   07/14/22 1100  ertapenem [facility]) injection 1,000 mg  Status:  Discontinued        1 g Intramuscular Daily 07/14/22 0935 07/14/22 1000   07/14/22 1100  ertapenem (INVANZ) 1,000 mg in sodium chloride 0.9 % 100 mL IVPB        1 g 200 mL/hr over 30 Minutes Intravenous Daily 07/14/22 1002     07/14/22 1030  ciprofloxacin (CIPRO) IVPB 400 mg  Status:  Discontinued        400 mg 200 mL/hr over 60 Minutes Intravenous Every 12 hours 07/14/22 0934 07/14/22 0935   07/14/22 1030  metroNIDAZOLE (  FLAGYL) IVPB 500 mg  Status:  Discontinued        500 mg 100 mL/hr over 60 Minutes Intravenous Every 8 hours 07/14/22 0934 07/14/22 0935   07/12/22 1800  cefTRIAXone (ROCEPHIN) 2 g in sodium chloride 0.9 % 100 mL IVPB  Status:  Discontinued        2 g 200 mL/hr over 30 Minutes Intravenous Every 24 hours 07/11/22 1759 07/12/22 0836   07/12/22 0900  piperacillin-tazobactam (ZOSYN) IVPB 3.375 g  Status:  Discontinued        3.375 g 12.5 mL/hr over 240 Minutes Intravenous Every 8 hours 07/12/22 0836 07/14/22 0934   07/11/22 1800  cefTRIAXone (ROCEPHIN) 1 g in sodium chloride 0.9 % 100 mL IVPB        1 g 200 mL/hr over 30 Minutes Intravenous  Once 07/11/22 1759 07/11/22 2300   07/11/22 1730  metroNIDAZOLE (FLAGYL) IVPB 500 mg  Status:  Discontinued        500 mg 100 mL/hr over 60 Minutes Intravenous Every 12  hours 07/11/22 1726 07/12/22 0836   07/11/22 1545  cefTRIAXone (ROCEPHIN) 1 g in sodium chloride 0.9 % 100 mL IVPB        1 g 200 mL/hr over 30 Minutes Intravenous  Once 07/11/22 1540 07/11/22 1716        Assessment/Plan Diverticulitis with small abscess - 1st bout, last colonoscopy 2013 - CT scan 9/6 shows Acute sigmoid colon diverticulitis with small peridiverticular abscess/fluid collection, 1.9 cm in greatest dimension Continue NPO other than ice chips and sips.   Switched antibiotics yesterday.  Seems some improved clinically.    ID - rocephin/flagyl 9/6>>9/7, zosyn 9/7>>ertapenem 9/9 FEN - IVF, ice chips/sips VTE - SCDs, lovenox Foley - none   Dispo: repeat Ct today.    Klebsiella pneumoniae UTI - should also be covered by ertapenem.  PMR on simponi/prednisone/MTX HTN HLD CAD Chronic back pain   I reviewed last 24 h vitals and pain scores, last 48 h intake and output, last 24 h labs and trends. Discussed with hospitalist.      LOS: 3 days   Milus Height, MD FACS Surgical Oncology, General Surgery, Trauma and [facility], [place] for weekday/non holidays Check [email or web address] for coverage night/weekend/holidays  Do not use SecureChat as it is not reliable for timely patient care with surgeons.

## 2022-07-15 NOTE — Progress Notes (Signed)
Mobility Specialist Cancellation/Refusal Note:   Reason for Cancellation/Refusal: Pt declined mobility at this time. Pt getting Xray. Will check back as schedule permits.       [facility]

## 2022-07-15 NOTE — Plan of Care (Signed)
  Problem: Education: Goal: Knowledge of General Education information will improve Description: Including pain rating scale, medication(s)/side effects and non-pharmacologic comfort measures Outcome: Progressing   Problem: Health Behavior/Discharge Planning: Goal: Ability to manage health-related needs will improve Outcome: Progressing   Problem: Clinical Measurements: Goal: Ability to maintain clinical measurements within normal limits will improve Outcome: Progressing Goal: Will remain free from infection Outcome: Progressing Goal: Diagnostic test results will improve Outcome: Progressing Goal: Respiratory complications will improve Outcome: Progressing Goal: Cardiovascular complication will be avoided Outcome: Progressing   Problem: Activity: Goal: Risk for activity intolerance will decrease Outcome: Progressing   Problem: Nutrition: Goal: Adequate nutrition will be maintained Outcome: Progressing   Problem: Coping: Goal: Level of anxiety will decrease Outcome: Progressing   Problem: Elimination: Goal: Will not experience complications related to bowel motility Outcome: Progressing Goal: Will not experience complications related to urinary retention Outcome: Progressing   Problem: Safety: Goal: Ability to remain free from injury will improve Outcome: Progressing   Problem: Skin Integrity: Goal: Risk for impaired skin integrity will decrease Outcome: Progressing   Problem: Education: Goal: Knowledge of General Education information will improve Description: Including pain rating scale, medication(s)/side effects and non-pharmacologic comfort measures Outcome: Progressing   Problem: Health Behavior/Discharge Planning: Goal: Ability to manage health-related needs will improve Outcome: Progressing   Problem: Clinical Measurements: Goal: Ability to maintain clinical measurements within normal limits will improve Outcome: Progressing Goal: Will remain free  from infection Outcome: Progressing Goal: Diagnostic test results will improve Outcome: Progressing Goal: Respiratory complications will improve Outcome: Progressing Goal: Cardiovascular complication will be avoided Outcome: Progressing   Problem: Activity: Goal: Risk for activity intolerance will decrease Outcome: Progressing    Problem: Skin Integrity: Goal: Risk for impaired skin integrity will decrease Outcome: Progressing   Problem: Safety: Goal: Ability to remain free from injury will improve Outcome: Progressing

## 2022-07-16 ENCOUNTER — Inpatient Hospital Stay: Payer: Self-pay

## 2022-07-16 DIAGNOSIS — K5720 Diverticulitis of large intestine with perforation and abscess without bleeding: Secondary | ICD-10-CM | POA: Diagnosis not present

## 2022-07-16 LAB — CBC
HCT: 32.9 % — ABNORMAL LOW (ref 36.0–46.0)
Hemoglobin: 10.5 g/dL — ABNORMAL LOW (ref 12.0–15.0)
MCH: 29.9 pg (ref 26.0–34.0)
MCHC: 31.9 g/dL (ref 30.0–36.0)
MCV: 93.7 fL (ref 80.0–100.0)
Platelets: 268 10*3/uL (ref 150–400)
RBC: 3.51 MIL/uL — ABNORMAL LOW (ref 3.87–5.11)
RDW: 14.6 % (ref 11.5–15.5)
WBC: 8.1 10*3/uL (ref 4.0–10.5)
nRBC: 0 % (ref 0.0–0.2)

## 2022-07-16 LAB — BASIC METABOLIC PANEL
Anion gap: 10 (ref 5–15)
BUN: 6 mg/dL — ABNORMAL LOW (ref 8–23)
CO2: 18 mmol/L — ABNORMAL LOW (ref 22–32)
Calcium: 8.5 mg/dL — ABNORMAL LOW (ref 8.9–10.3)
Chloride: 115 mmol/L — ABNORMAL HIGH (ref 98–111)
Creatinine, Ser: 0.93 mg/dL (ref 0.44–1.00)
GFR, Estimated: >60 mL/min (ref >60)
Glucose, Bld: 70 mg/dL (ref 70–99)
Potassium: 3.7 mmol/L (ref 3.5–5.1)
Sodium: 143 mmol/L (ref 135–145)

## 2022-07-16 MED ORDER — TRAVASOL 10 % IV SOLN
INTRAVENOUS | Status: AC
  Filled 2022-07-16: qty 480

## 2022-07-16 MED ORDER — SODIUM CHLORIDE 0.9 % IV SOLN: INTRAVENOUS | Status: DC

## 2022-07-16 MED ORDER — CHLORHEXIDINE GLUCONATE CLOTH 2 % EX PADS
6.0000 | MEDICATED_PAD | Freq: Every day | CUTANEOUS | Status: DC
  Administered 2022-07-18 – 2022-07-27 (×10): 6 via TOPICAL

## 2022-07-16 MED ORDER — SODIUM CHLORIDE 0.9% FLUSH
10.0000 mL | Freq: Two times a day (BID) | INTRAVENOUS | Status: DC
  Administered 2022-07-16 – 2022-07-26 (×21): 10 mL

## 2022-07-16 MED ORDER — INSULIN ASPART 100 UNIT/ML IJ SOLN
0.0000 [IU] | Freq: Four times a day (QID) | INTRAMUSCULAR | Status: DC
  Administered 2022-07-17: 2 [IU] via SUBCUTANEOUS
  Administered 2022-07-17: 3 [IU] via SUBCUTANEOUS
  Administered 2022-07-18: 2 [IU] via SUBCUTANEOUS

## 2022-07-16 MED ORDER — SODIUM CHLORIDE 0.9% FLUSH: 10.0000 mL | INTRAVENOUS | Status: DC | PRN

## 2022-07-16 NOTE — Progress Notes (Signed)
Initial Nutrition Assessment  DOCUMENTATION CODES:   Non-severe (moderate) malnutrition in context of acute illness/injury  INTERVENTION:   Monitor magnesium, potassium, and phosphorus BID for at least 3 days, MD to replete as needed, as pt is at risk for refeeding syndrome.  -TPN management per Pharmacy -Recommend 100 mg Thiamine x 5 days given refeeding risk and persistent diarrhea  -Recommend Banatrol fiber supplement BID to aid in diarrhea  NUTRITION DIAGNOSIS:   Moderate Malnutrition related to acute illness as evidenced by mild fat depletion, moderate muscle depletion, energy intake < 75% for > 7 days.  GOAL:   Patient will meet greater than or equal to 90% of their needs  MONITOR:   PO intake, Supplement acceptance, Labs, Weight trends, I & O's (TPN)  REASON FOR ASSESSMENT:   Consult New TPN/TNA  ASSESSMENT:   71 year old female with history of polymyalgia rheumatica, carotid artery stenosis left greater than right, hepatic hemangioma, hyperlipidemia, aortic atherosclerosis, hypertension, chronic back pain presented to the ED with abdominal pain left lower quadrant and suprapubic with nausea x3 days with associated chills, possible fever at home but no vomiting or diarrhea.  Patient in room, husband at bedside. Pt reports she has not been eating well d/t persistent diarrhea. Pt states everything causes diarrhea, even water. Pt has felt very fatigued and is very discouraged. Pt currently on clears. Would likely benefit from Banatrol fiber supplement to help with loose stools.  TPN to begin today at 40 ml/hr providing 969 kcals and 48g protein.   Per weight records, last weight recorded was 163 lbs on 8/16. Pt reports UBW of 170 lbs. Needs updated weight for admission.  Medications: Calcium carbonate, Folic acid  Labs reviewed.  NUTRITION - FOCUSED PHYSICAL EXAM:  Flowsheet Row Most Recent Value  Orbital Region Mild depletion  Upper Arm Region No depletion   Thoracic and Lumbar Region No depletion  Buccal Region Mild depletion  Temple Region No depletion  Clavicle Bone Region Moderate depletion  Clavicle and Acromion Bone Region Moderate depletion  Scapular Bone Region Moderate depletion  Dorsal Hand Moderate depletion  Patellar Region Mild depletion  Anterior Thigh Region Mild depletion  Posterior Calf Region Mild depletion  Edema (RD Assessment) None  Hair Reviewed  Eyes Reviewed  Mouth Reviewed  Skin Reviewed       Diet Order:   Diet Order             Diet clear liquid Room service appropriate? Yes; Fluid consistency: Thin  Diet effective now                   EDUCATION NEEDS:   Education needs have been addressed  Skin:  Skin Assessment: Reviewed RN Assessment  Last BM:  9/11 -type 7  Height:   Ht Readings from Last 1 Encounters:  06/20/22 '5\' 3"'$  (1.6 m)    Weight:   Wt Readings from Last 1 Encounters:  07/16/22 72.3 kg    BMI:  Body mass index is 28.22 kg/m.  Estimated Nutritional Needs:   Kcal:  1800-2000  Protein:  80-90g  Fluid:  2L/day   Clayton Bibles, MS, RD, LDN Inpatient Clinical Dietitian Contact information available via Amion

## 2022-07-16 NOTE — Progress Notes (Signed)
Peripherally Inserted Central Catheter Placement  The IV Nurse has discussed with the patient and/or persons authorized to consent for the patient, the purpose of this procedure and the potential benefits and risks involved with this procedure.  The benefits include less needle sticks, lab draws from the catheter, and the patient may be discharged home with the catheter. Risks include, but not limited to, infection, bleeding, blood clot (thrombus formation), and puncture of an artery; nerve damage and irregular heartbeat and possibility to perform a PICC exchange if needed/ordered by physician.  Alternatives to this procedure were also discussed.  Bard Power PICC patient education guide, fact sheet on infection prevention and patient information card has been provided to patient /or left at bedside.    PICC Placement Documentation  PICC Double Lumen 07/16/22 Right Brachial 34 cm 0 cm (Active)  Indication for Insertion or Continuance of Line Administration of hyperosmolar/irritating solutions (i.e. TPN, Vancomycin, etc.) 07/16/22 1725  Exposed Catheter (cm) 0 cm 07/16/22 1725  Site Assessment Clean;Dry;Intact 07/16/22 1725  Lumen #1 Status Flushed;Saline locked;Blood return noted 07/16/22 1725  Lumen #2 Status Flushed;Saline locked;Blood return noted 07/16/22 1725  Dressing Type Transparent;Securing device 07/16/22 1725  Dressing Status Antimicrobial disc in place;Clean, Dry, Intact 07/16/22 1725  Safety Lock Not Applicable 07/28/29 0762  Line Care Connections checked and tightened 07/16/22 1725  Line Adjustment (NICU/IV Team Only) No 07/16/22 1725  Dressing Intervention New dressing 07/16/22 1725  Dressing Change Due 07/23/22 07/16/22 [place], Nicolette Bang 07/16/2022, 5:26 PM

## 2022-07-16 NOTE — Progress Notes (Signed)
PROGRESS NOTE Jenny Glass  MWN:[id number] DOB: Dec 10, 1950 DOA: 07/11/2022 PCP: Merrilee Seashore, MD   Brief Narrative/Hospital Course: 71 year old female with history of polymyalgia rheumatica, carotid artery stenosis left greater than right, hepatic hemangioma, hyperlipidemia, aortic atherosclerosis, hypertension, chronic back pain presented to the ED with abdominal pain left lower quadrant and suprapubic with nausea x3 days with associated chills, possible fever at home but no vomiting or diarrhea.  Seen at PCP office and instructed to come to the ED. In the ED vitals stable, underwent lab work that showed mild leukocytosis abnormal UA, CT abdomen pelvis with acute sigmoid colon diverticulitis small peridiverticular abscess/fluid collection 1.9 cm.  Patient was given LR 1 L, Rocephin 1 g, Flagyl, lactic acid ordered and admission requested.  Seen by surgery, antibiotic changed to Zosyn for complicated diverticulitis-being managed conservatively.  Initial Flagyl Rocephin/and switched to Zosyn > then to [facility] 07/14/22.    Subjective: Complains of loose stool.  Still having left lower quadrant pain Afebrile overnight, WBC count stable  Assessment and Plan: Principal Problem:   Abscess of sigmoid colon due to diverticulitis Active Problems:   Hyperlipidemia   Chronic back pain   Hypertension   Carotid artery stenosis   UTI (urinary tract infection)   Diverticulitis of intestine with abscess   Acute complicated sigmoid diverticulitis with a small abscess 1.9 cm in CT:  Patient with ongoing left abdominal pain Zosyn> changed to Invanz 9/9 per surgery.  Continue plan of care as per surgery, remains afebrile wbc count stable and appears clinically stable-she had repeat CT scan yesterday showing similar diverticulitis with a small abscess 2.6 cm-slightly bigger.  Planning for PICC line with TPN given patient's decreased oral intake. Discussed further need to have outpatient colonoscopy in 6 to 8  weeks follow-up.  Loose stool checking C. Difficile.  Wheezing/shortness of breath  cxr 9/8- any acute finding, on gentle IV fluid rate , encourage incentive spirometry, bronchodilators.  Hypokalemia resolved Klebsiella pneumoniae UTI : Antibiotic coverage as per #1  PMR on simponi inj q6-8 wk,methotrexate inj q wkly, and prednisone. Patient is immunocompromised status Patient is due for injection Simponi agents but will hold off due to patient's acute infection and will need to be reassessed by her rheumatologist. Cont her prednisone  Headache:continue Tylenol prn  Hyperlipidemia Hypertension Carotid artery stenosis: Stable no chest pain.BP is well controlled . Cont to hold amlodipine and losartan. Cont crestor 10 mg,   Chronic back pain: cont pain control.   DVT prophylaxis: enoxaparin (LOVENOX) injection 40 mg Start: 07/11/22 2200 Code Status:   Code Status: Full Code Family Communication: plan of care discussed with patient and her husband at bedside. Patient status is: Remains hospitalized for continued IV antibiotics for complicated diverticulitis Level of care: Med-Surg  Dispo: The patient is from: home            Anticipated disposition: home  Mobility Assessment (last 72 hours)     Mobility Assessment     Row Name 07/15/22 2009 07/15/22 0812 07/14/22 2326 07/13/22 2006     Does patient have an order for bedrest or is patient medically unstable No - Continue assessment No - Continue assessment No - Continue assessment No - Continue assessment    What is the highest level of mobility based on the progressive mobility assessment? Level 6 (Walks independently in room and hall) - Balance while walking in room without assist - Complete Level 6 (Walks independently in room and hall) - Balance while walking in room without  assist - Complete Level 6 (Walks independently in room and hall) - Balance while walking in room without assist - Complete Level 6 (Walks independently in room  and hall) - Balance while walking in room without assist - Complete           Objective: Vitals last 24 hrs: Vitals:   07/15/22 1554 07/15/22 2111 07/16/22 0509 07/16/22 1239  BP: (!) 159/85 136/77 105/60   Pulse: 69 86 68   Resp: '17 18 17   '$ Temp: (!) 97.4 F (36.3 C) 99.5 F (37.5 C) 98.1 F (36.7 C)   TempSrc: Oral Oral Oral   SpO2: 97% 96% 100%   Weight:    72.3 kg   Weight change:   Physical Examination: General exam: AAox3, older than stated age, weak appearing. HEENT:Oral mucosa moist, Ear/Nose WNL grossly, dentition normal. Respiratory system: bilaterally diminished, no use of accessory muscle Cardiovascular system: S1 & S2 +, No JVD,. Gastrointestinal system: Abdomen soft,Tender LLQ,BS+ Nervous System:Alert, awake, moving extremities and grossly nonfocal Extremities: LE ankle edema neg, distal peripheral pulses palpable.  Skin: No rashes,no icterus. MSK: Normal muscle bulk,tone, power   Medications reviewed:  Scheduled Meds:  albuterol  2.5 mg Nebulization Once   calcium carbonate  1 tablet Oral Daily   enoxaparin (LOVENOX) injection  40 mg Subcutaneous K93G   folic acid  182 mcg Oral Daily   guaiFENesin  600 mg Oral BID   [START ON 07/17/2022] insulin aspart  0-15 Units Subcutaneous Q6H   predniSONE  2 mg Oral Q breakfast   rosuvastatin  10 mg Oral Daily  Continuous Infusions:  sodium chloride 1,000 mL (07/15/22 1905)   sodium chloride     ertapenem 1 g (07/16/22 1015)   TPN ADULT (ION)      Diet Order             Diet clear liquid Room service appropriate? Yes; Fluid consistency: Thin  Diet effective now                  Intake/Output Summary (Last 24 hours) at 07/16/2022 1409 Last data filed at 07/15/2022 1445 Gross per 24 hour  Intake 120.72 ml  Output --  Net 120.72 ml    Net IO Since Admission: 1,817.6 mL [07/16/22 1409]  Wt Readings from Last 3 Encounters:  07/16/22 72.3 kg  06/20/22 74.4 kg  05/01/22 75.4 kg     Unresulted Labs  (From admission, onward)     Start     Ordered   07/19/22 0500  Comprehensive metabolic panel  (TPN Lab Panel)  Every Mon,Thu (0500),   R     Question:  Specimen collection method  Answer:  Lab=Lab collect   07/16/22 1104   07/19/22 0500  Magnesium  (TPN Lab Panel)  Every Mon,Thu (0500),   R     Question:  Specimen collection method  Answer:  Lab=Lab collect   07/16/22 1104   07/19/22 0500  Phosphorus  (TPN Lab Panel)  Every Mon,Thu (0500),   R     Question:  Specimen collection method  Answer:  Lab=Lab collect   07/16/22 1104   07/19/22 0500  Triglycerides  (TPN Lab Panel)  Every Mon,Thu (0500),   R     Question:  Specimen collection method  Answer:  Lab=Lab collect   07/16/22 1104   07/18/22 0500  Creatinine, serum  (enoxaparin (LOVENOX)    CrCl >/= 30 ml/min)  Weekly,   R     Comments: while  on enoxaparin therapy    07/11/22 1740   07/17/22 0500  Comprehensive metabolic panel  (TPN Lab Panel)  Tomorrow morning,   R       Question:  Specimen collection method  Answer:  Lab=Lab collect   07/16/22 1104   07/17/22 0500  Magnesium  (TPN Lab Panel)  Tomorrow morning,   R       Question:  Specimen collection method  Answer:  Lab=Lab collect   07/16/22 1104   07/17/22 0500  Phosphorus  (TPN Lab Panel)  Tomorrow morning,   R       Question:  Specimen collection method  Answer:  Lab=Lab collect   07/16/22 1104   07/17/22 0500  Triglycerides  (TPN Lab Panel)  Tomorrow morning,   R       Question:  Specimen collection method  Answer:  Lab=Lab collect   07/16/22 1104   07/14/22 0500  CBC  Daily at 5am,   R     Question:  Specimen collection method  Answer:  Lab=Lab collect   07/13/22 0804   07/14/22 0347  Basic metabolic panel  Daily at 5am,   R     Question:  Specimen collection method  Answer:  Lab=Lab collect   07/13/22 0804   Pending  C Difficile Quick Screen (NO PCR Reflex)  (C Difficile Quick Screen (NO PCR Reflex) panel)  Once, for 24 hours,   R     References:    CDiff Information  Tool   Pending          Data Reviewed: I have personally reviewed following labs and imaging studies CBC: Recent Labs  Lab 07/12/22 0708 07/13/22 0625 07/14/22 0751 07/15/22 0605 07/16/22 0541  WBC 8.9 8.0 8.5 6.4 8.1  HGB 10.5* 10.5* 10.4* 10.1* 10.5*  HCT 33.6* 34.0* 33.1* 32.6* 32.9*  MCV 94.6 96.6 95.1 96.4 93.7  PLT 168 176 192 216 425    Basic Metabolic Panel: Recent Labs  Lab 07/12/22 0708 07/13/22 0625 07/14/22 0751 07/15/22 0605 07/16/22 0541  NA 141 139 139 142 143  K 3.3* 3.8 3.6 3.7 3.7  CL 110 111 108 111 115*  CO2 '24 24 23 23 '$ 18*  GLUCOSE 93 96 92 71 70  BUN 8 6* 6* 7* 6*  CREATININE 0.99 0.94 0.89 0.82 0.93  CALCIUM 8.0* 7.7* 8.2* 8.2* 8.5*  MG 1.8  --   --   --   --     GFR: Estimated Creatinine Clearance: 54.4 mL/min (by C-G formula based on SCr of 0.93 mg/dL). Liver Function Tests: Recent Labs  Lab 07/11/22 1446 07/12/22 0708  AST 13* 9*  ALT 11 9  ALKPHOS 55 47  BILITOT 0.9 0.9  PROT 7.3 6.0*  ALBUMIN 3.9 3.1*     Recent Results (from the past 240 hour(s))  Urine Culture     Status: Abnormal   Collection Time: 07/11/22  2:46 PM   Specimen: Urine, Clean Catch  Result Value Ref Range Status   Specimen Description   Final    URINE, CLEAN CATCH Performed at [facility], [place] [address] [place], [place] [address]    Special Requests   Final    NONE Performed at [facility], [place] [address] [place], [place] [address]    Culture >=100,000 COLONIES/mL KLEBSIELLA PNEUMONIAE (A)  Final   Report Status 07/13/2022 FINAL  Final   Organism ID, Bacteria KLEBSIELLA PNEUMONIAE (A)  Final  Susceptibility   Klebsiella pneumoniae - MIC*    AMPICILLIN RESISTANT Resistant     CEFAZOLIN <=4 SENSITIVE Sensitive     CEFEPIME <=0.12 SENSITIVE Sensitive     CEFTRIAXONE <=0.25 SENSITIVE Sensitive     CIPROFLOXACIN <=0.25 SENSITIVE Sensitive     GENTAMICIN <=1 SENSITIVE Sensitive     IMIPENEM <=0.25  SENSITIVE Sensitive     NITROFURANTOIN 64 INTERMEDIATE Intermediate     TRIMETH/SULFA <=20 SENSITIVE Sensitive     AMPICILLIN/SULBACTAM 4 SENSITIVE Sensitive     PIP/TAZO <=4 SENSITIVE Sensitive     * >=100,000 COLONIES/mL KLEBSIELLA PNEUMONIAE    Antimicrobials: Anti-infectives (From admission, onward)    Start     Dose/Rate Route Frequency Ordered Stop   07/14/22 1100  ertapenem [facility]) injection 1,000 mg  Status:  Discontinued        1 g Intramuscular Daily 07/14/22 0935 07/14/22 1000   07/14/22 1100  ertapenem (INVANZ) 1,000 mg in sodium chloride 0.9 % 100 mL IVPB        1 g 200 mL/hr over 30 Minutes Intravenous Daily 07/14/22 1002     07/14/22 1030  ciprofloxacin (CIPRO) IVPB 400 mg  Status:  Discontinued        400 mg 200 mL/hr over 60 Minutes Intravenous Every 12 hours 07/14/22 0934 07/14/22 0935   07/14/22 1030  metroNIDAZOLE (FLAGYL) IVPB 500 mg  Status:  Discontinued        500 mg 100 mL/hr over 60 Minutes Intravenous Every 8 hours 07/14/22 0934 07/14/22 0935   07/12/22 1800  cefTRIAXone (ROCEPHIN) 2 g in sodium chloride 0.9 % 100 mL IVPB  Status:  Discontinued        2 g 200 mL/hr over 30 Minutes Intravenous Every 24 hours 07/11/22 1759 07/12/22 0836   07/12/22 0900  piperacillin-tazobactam (ZOSYN) IVPB 3.375 g  Status:  Discontinued        3.375 g 12.5 mL/hr over 240 Minutes Intravenous Every 8 hours 07/12/22 0836 07/14/22 0934   07/11/22 1800  cefTRIAXone (ROCEPHIN) 1 g in sodium chloride 0.9 % 100 mL IVPB        1 g 200 mL/hr over 30 Minutes Intravenous  Once 07/11/22 1759 07/11/22 2300   07/11/22 1730  metroNIDAZOLE (FLAGYL) IVPB 500 mg  Status:  Discontinued        500 mg 100 mL/hr over 60 Minutes Intravenous Every 12 hours 07/11/22 1726 07/12/22 0836   07/11/22 1545  cefTRIAXone (ROCEPHIN) 1 g in sodium chloride 0.9 % 100 mL IVPB        1 g 200 mL/hr over 30 Minutes Intravenous  Once 07/11/22 1540 07/11/22 1716      Culture/Microbiology    Component Value  Date/Time   SDES  07/11/2022 1446    URINE, CLEAN CATCH Performed at [facility], [place] [address] [place], [place] [address]    SPECREQUEST  07/11/2022 1446    NONE Performed at [facility], [place] [address], [place], [place] [address]    CULT >=100,000 COLONIES/mL KLEBSIELLA PNEUMONIAE (A) 07/11/2022 1446   REPTSTATUS 07/13/2022 FINAL 07/11/2022 1446   Radiology Studies: [place] EKG SITE RITE  Result Date: 07/16/2022 If Site Rite image not attached, placement could not be confirmed due to current cardiac rhythm.  CT ABDOMEN PELVIS W CONTRAST  Result Date: 07/15/2022 CLINICAL DATA:  Follow-up diverticulitis and diverticular abscess. EXAM: CT ABDOMEN AND PELVIS WITH CONTRAST TECHNIQUE: Multidetector CT imaging of the abdomen and pelvis was performed using the standard  protocol following bolus administration of intravenous contrast. RADIATION DOSE REDUCTION: This exam was performed according to the departmental dose-optimization program which includes automated exposure control, adjustment of the mA and/or kV according to patient size and/or use of iterative reconstruction technique. CONTRAST:  114m OMNIPAQUE IOHEXOL 300 MG/ML  SOLN COMPARISON:  07/11/2022 FINDINGS: Lower Chest: No acute findings. Hepatobiliary: Stable 2 cm benign hemangioma in segment 2 of the left hepatic lobe. Stable 9 mm cyst in the inferior right hepatic lobe. No new or enlarging liver lesions are identified. Gallbladder is unremarkable. No evidence of biliary ductal dilatation. Pancreas:  No mass or inflammatory changes. Spleen: Within normal limits in size and appearance. Adrenals/Urinary Tract: No suspicious masses identified. Moderate chronic right renal parenchymal scarring is again noted. No evidence of ureteral calculi. Mild bilateral hydroureteronephrosis has increased since previous study due to involvement by diverticulitis described below. Stomach/Bowel: Stable moderate hiatal  hernia. Moderate sigmoid diverticulitis shows no significant change since prior study. A small rim enhancing gas and fluid collection in the central sigmoid mesentery currently measures 2.6 x 1.4 cm, compared to 2.3 x 1.1 cm previously. No other abscess or free fluid identified. Vascular/Lymphatic: No pathologically enlarged lymph nodes. No acute vascular findings. Reproductive:  No mass or other significant abnormality. Other:  None. Musculoskeletal:  No suspicious bone lesions identified. IMPRESSION: No significant change in moderate sigmoid diverticulitis. Slight increase in size of 2.6 cm diverticular abscess in central sigmoid mesentery. Increased mild bilateral hydroureteronephrosis due to involvement of both ureters by diverticulitis. Stable moderate hiatal hernia. Electronically Signed   By: JMarlaine HindM.D.   On: 07/15/2022 12:26     LOS: 4 days   RAntonieta Pert MD [facility]  07/16/2022, 2:09 PM

## 2022-07-16 NOTE — Progress Notes (Signed)
PHARMACY - TOTAL PARENTERAL NUTRITION CONSULT NOTE   Indication: sigmoid colon diverticulitis with abscess resulting in intolerance to enteral feeding  Patient Measurements: Height: _0  Usual Weight: 74.4 kg IBW: 52.4 kg AdjBW: 57.9 kg BMI: 29.1  Assessment:  Pt is a 63 yoF with a PMH significant for polymyalgia rheumatica, carotid artery stenosis, hepatic hemangioma, aortic atherosclerosis, HTN, and chronic back pain who presented to the ED 9/6 with LLQ abdominal pain, suprapubic pain, nausea, and chills x 3 days. CT abdomen pelvis revealed acute sigmoid colon diverticulitis and abscess. TPN indicated for prolonged inability to tolerate enteral feeds and associated malnutrition. TPN being initiated on day 5 of admission after failing to improve on antibiotics alone.  Glucose: no hx DM CBGs < 150 Electrolytes: Cl high at 115, CO2 low at 18, CoCa and all other lytes WNL  Renal: SCr stable at 0.93 (eCrCl = 55 mL/min); BUN low at 6 Hepatic: AST, ALT, Tbili, alk phos WNL; albumin low at 3.1 I/O: net +899.6 mL; no output charted but reported to have diarrhea in surgical PA's note - MIVF: NS @ 50 mL/hr  GI Imaging: - 9/6 CT showed acute sigmoid colon diverticulitis with small peridiverticular abscess/fluid collection - 1.9 cm - 9/10 CT showed similar amount of inflammatory changes around sigmoid colon and slight increase in fluid collection - 2.6 cm  GI Surgeries / Procedures: n/a  Central access: PICC TPN start date: 9/11  Nutritional Goals: - Initial RPh estimated needs (RD recommendations pending):  - Total energy:1,500 kcal/day  - Total protein: 60 g/day  - Total fluids: 2,260 mL/day  Current Nutrition:  Clear liquids  Plan:  - Start TPN at 40 mL/hr at 1800 (provides 48 g protein, 969.9 kcal)  - Electrolytes in TPN: Na 53mq/L, K 539m/L, Ca 78m41mL, Mg 78mE29m, and Phos 178mm58m. Cl:Ac 1:1 - Add standard MVI and trace elements to TPN - Initiate Sensitive q6h SSI and adjust as  needed  - Reduce MIVF to 10 mL/hr at 1800 - Monitor TPN labs on Mon/Thurs - Monitor daily BMP - Surgery anticipated later this week  HannaCannon KettlermD Candidate 07/16/2022 9:55 AM

## 2022-07-16 NOTE — Progress Notes (Addendum)
[facility] Progress Note     Subjective: CC-  Reports sharp LLQ pain and many episodes of diarrhea after CT scan w/ PO contrast. Overall she feels no better, no worse compared to admission. Reports poor appetite for a few days leading up to her hospital admission. Denies vomiting or blood in her stool. Her husband is at bedside.  Objective: Vital signs in last 24 hours: Temp:  [97.4 F (36.3 C)-99.5 F (37.5 C)] 98.1 F (36.7 C) (09/11 0509) Pulse Rate:  [68-86] 68 (09/11 0509) Resp:  [17-18] 17 (09/11 0509) BP: (105-159)/(60-85) 105/60 (09/11 0509) SpO2:  [96 %-100 %] 100 % (09/11 0509) Last BM Date : 07/16/22  Intake/Output from previous day: 09/10 0701 - 09/11 0700 In: 899.6 [P.O.:20; I.V.:779.6; IV Piggyback:100] Out: -  Intake/Output this shift: No intake/output data recorded.  PE: Gen:  Alert, NAD, pleasant Looks overall less fatigued Cardio: RRR Pulm: CTAB, no wheezing, rate and effort normal  Abd: soft, ND, mildly TTP in LLW without guarding. No rebound.  Lab Results:  Recent Labs    07/15/22 0605 07/16/22 0541  WBC 6.4 8.1  HGB 10.1* 10.5*  HCT 32.6* 32.9*  PLT 216 268   BMET Recent Labs    07/15/22 0605 07/16/22 0541  NA 142 143  K 3.7 3.7  CL 111 115*  CO2 23 18*  GLUCOSE 71 70  BUN 7* 6*  CREATININE 0.82 0.93  CALCIUM 8.2* 8.5*   PT/INR No results for input(s): "LABPROT", "INR" in the last 72 hours. CMP     Component Value Date/Time   NA 143 07/16/2022 0541   K 3.7 07/16/2022 0541   CL 115 (H) 07/16/2022 0541   CO2 18 (L) 07/16/2022 0541   GLUCOSE 70 07/16/2022 0541   BUN 6 (L) 07/16/2022 0541   CREATININE 0.93 07/16/2022 0541   CREATININE 1.00 02/16/2012 1509   CALCIUM 8.5 (L) 07/16/2022 0541   PROT 6.0 (L) 07/12/2022 0708   ALBUMIN 3.1 (L) 07/12/2022 0708   AST 9 (L) 07/12/2022 0708   ALT 9 07/12/2022 0708   ALKPHOS 47 07/12/2022 0708   BILITOT 0.9 07/12/2022 0708   GFRNONAA >60 07/16/2022 0541   GFRAA 46 (L)  01/10/2019 2103   Lipase     Component Value Date/Time   LIPASE 23 07/11/2022 1446       Studies/Results: CT ABDOMEN PELVIS W CONTRAST  Result Date: 07/15/2022 CLINICAL DATA:  Follow-up diverticulitis and diverticular abscess. EXAM: CT ABDOMEN AND PELVIS WITH CONTRAST TECHNIQUE: Multidetector CT imaging of the abdomen and pelvis was performed using the standard protocol following bolus administration of intravenous contrast. RADIATION DOSE REDUCTION: This exam was performed according to the departmental dose-optimization program which includes automated exposure control, adjustment of the mA and/or kV according to patient size and/or use of iterative reconstruction technique. CONTRAST:  124m OMNIPAQUE IOHEXOL 300 MG/ML  SOLN COMPARISON:  07/11/2022 FINDINGS: Lower Chest: No acute findings. Hepatobiliary: Stable 2 cm benign hemangioma in segment 2 of the left hepatic lobe. Stable 9 mm cyst in the inferior right hepatic lobe. No new or enlarging liver lesions are identified. Gallbladder is unremarkable. No evidence of biliary ductal dilatation. Pancreas:  No mass or inflammatory changes. Spleen: Within normal limits in size and appearance. Adrenals/Urinary Tract: No suspicious masses identified. Moderate chronic right renal parenchymal scarring is again noted. No evidence of ureteral calculi. Mild bilateral hydroureteronephrosis has increased since previous study due to involvement by diverticulitis described below. Stomach/Bowel: Stable moderate hiatal hernia. Moderate sigmoid  diverticulitis shows no significant change since prior study. A small rim enhancing gas and fluid collection in the central sigmoid mesentery currently measures 2.6 x 1.4 cm, compared to 2.3 x 1.1 cm previously. No other abscess or free fluid identified. Vascular/Lymphatic: No pathologically enlarged lymph nodes. No acute vascular findings. Reproductive:  No mass or other significant abnormality. Other:  None. Musculoskeletal:   No suspicious bone lesions identified. IMPRESSION: No significant change in moderate sigmoid diverticulitis. Slight increase in size of 2.6 cm diverticular abscess in central sigmoid mesentery. Increased mild bilateral hydroureteronephrosis due to involvement of both ureters by diverticulitis. Stable moderate hiatal hernia. Electronically Signed   By: Marlaine Hind M.D.   On: 07/15/2022 12:26    Anti-infectives: Anti-infectives (From admission, onward)    Start     Dose/Rate Route Frequency Ordered Stop   07/14/22 1100  ertapenem [facility]) injection 1,000 mg  Status:  Discontinued        1 g Intramuscular Daily 07/14/22 0935 07/14/22 1000   07/14/22 1100  ertapenem (INVANZ) 1,000 mg in sodium chloride 0.9 % 100 mL IVPB        1 g 200 mL/hr over 30 Minutes Intravenous Daily 07/14/22 1002     07/14/22 1030  ciprofloxacin (CIPRO) IVPB 400 mg  Status:  Discontinued        400 mg 200 mL/hr over 60 Minutes Intravenous Every 12 hours 07/14/22 0934 07/14/22 0935   07/14/22 1030  metroNIDAZOLE (FLAGYL) IVPB 500 mg  Status:  Discontinued        500 mg 100 mL/hr over 60 Minutes Intravenous Every 8 hours 07/14/22 0934 07/14/22 0935   07/12/22 1800  cefTRIAXone (ROCEPHIN) 2 g in sodium chloride 0.9 % 100 mL IVPB  Status:  Discontinued        2 g 200 mL/hr over 30 Minutes Intravenous Every 24 hours 07/11/22 1759 07/12/22 0836   07/12/22 0900  piperacillin-tazobactam (ZOSYN) IVPB 3.375 g  Status:  Discontinued        3.375 g 12.5 mL/hr over 240 Minutes Intravenous Every 8 hours 07/12/22 0836 07/14/22 0934   07/11/22 1800  cefTRIAXone (ROCEPHIN) 1 g in sodium chloride 0.9 % 100 mL IVPB        1 g 200 mL/hr over 30 Minutes Intravenous  Once 07/11/22 1759 07/11/22 2300   07/11/22 1730  metroNIDAZOLE (FLAGYL) IVPB 500 mg  Status:  Discontinued        500 mg 100 mL/hr over 60 Minutes Intravenous Every 12 hours 07/11/22 1726 07/12/22 0836   07/11/22 1545  cefTRIAXone (ROCEPHIN) 1 g in sodium chloride 0.9 % 100  mL IVPB        1 g 200 mL/hr over 30 Minutes Intravenous  Once 07/11/22 1540 07/11/22 1716        Assessment/Plan Diverticulitis with small abscess - afebrile, normal WBC, on steroids/MTX - 1st bout, last colonoscopy 2013 - CT scan 9/6 shows Acute sigmoid colon diverticulitis with small peridiverticular abscess/fluid collection, 1.9 cm in greatest dimension - CT 9/10 w/ similar amount of inflammatory changes around sigmoid colon and slight increase in fluid collection - 2.6cm  - allow CLD, start TPN, monitor abd exam. - no emergent surgical needs. Patients CT scan is stable and clinically she is not worse, but not significantly improved per her history. Hopefully she will show improvement on ertapenem. Failure to improve may warrant laparotomy, partial colectomy, end colostomy. She would be at increased risk for issues with wound healing and post-operative complications given her  use of immunosuppressive therapies/steroids.   ID - rocephin/flagyl 9/6>>9/7, zosyn 9/7-9/9, ertapenem 9/9 >> FEN - CLD, PICC/TPN today. VTE - SCDs, lovenox Foley - none   Dispo: repeat Ct today.    Klebsiella pneumoniae UTI - should also be covered by ertapenem.  PMR on simponi/prednisone/MTX HTN HLD CAD Chronic back pain   I reviewed last 24 h vitals and pain scores, last 48 h intake and output, last 24 h labs and trends. Discussed with hospitalist.      LOS: 4 days   Obie Dredge, [facility] Please see Amion for pager number during day hours 7:00am-4:30pm

## 2022-07-16 NOTE — Plan of Care (Signed)

## 2022-07-17 DIAGNOSIS — K5720 Diverticulitis of large intestine with perforation and abscess without bleeding: Secondary | ICD-10-CM | POA: Diagnosis not present

## 2022-07-17 DIAGNOSIS — E440 Moderate protein-calorie malnutrition: Secondary | ICD-10-CM | POA: Insufficient documentation

## 2022-07-17 LAB — COMPREHENSIVE METABOLIC PANEL
ALT: 15 U/L (ref 0–44)
AST: 17 U/L (ref 15–41)
Albumin: 3.1 g/dL — ABNORMAL LOW (ref 3.5–5.0)
Alkaline Phosphatase: 54 U/L (ref 38–126)
Anion gap: 8 (ref 5–15)
BUN: 6 mg/dL — ABNORMAL LOW (ref 8–23)
CO2: 22 mmol/L (ref 22–32)
Calcium: 8.5 mg/dL — ABNORMAL LOW (ref 8.9–10.3)
Chloride: 112 mmol/L — ABNORMAL HIGH (ref 98–111)
Creatinine, Ser: 0.67 mg/dL (ref 0.44–1.00)
GFR, Estimated: >60 mL/min (ref >60)
Glucose, Bld: 121 mg/dL — ABNORMAL HIGH (ref 70–99)
Potassium: 3.1 mmol/L — ABNORMAL LOW (ref 3.5–5.1)
Sodium: 142 mmol/L (ref 135–145)
Total Bilirubin: 0.6 mg/dL (ref 0.3–1.2)
Total Protein: 6.3 g/dL — ABNORMAL LOW (ref 6.5–8.1)

## 2022-07-17 LAB — GLUCOSE, CAPILLARY
Glucose-Capillary: 112 mg/dL — ABNORMAL HIGH (ref 70–99)
Glucose-Capillary: 113 mg/dL — ABNORMAL HIGH (ref 70–99)
Glucose-Capillary: 118 mg/dL — ABNORMAL HIGH (ref 70–99)
Glucose-Capillary: 140 mg/dL — ABNORMAL HIGH (ref 70–99)
Glucose-Capillary: 170 mg/dL — ABNORMAL HIGH (ref 70–99)

## 2022-07-17 LAB — CBC
HCT: 32.9 % — ABNORMAL LOW (ref 36.0–46.0)
Hemoglobin: 10.6 g/dL — ABNORMAL LOW (ref 12.0–15.0)
MCH: 29.6 pg (ref 26.0–34.0)
MCHC: 32.2 g/dL (ref 30.0–36.0)
MCV: 91.9 fL (ref 80.0–100.0)
Platelets: 287 10*3/uL (ref 150–400)
RBC: 3.58 MIL/uL — ABNORMAL LOW (ref 3.87–5.11)
RDW: 14.1 % (ref 11.5–15.5)
WBC: 6.8 10*3/uL (ref 4.0–10.5)
nRBC: 0 % (ref 0.0–0.2)

## 2022-07-17 LAB — C DIFFICILE (CDIFF) QUICK SCRN (NO PCR REFLEX)
C Diff antigen: POSITIVE — AB
C Diff toxin: NEGATIVE

## 2022-07-17 LAB — MAGNESIUM: Magnesium: 2 mg/dL (ref 1.7–2.4)

## 2022-07-17 LAB — PHOSPHORUS: Phosphorus: 2.3 mg/dL — ABNORMAL LOW (ref 2.5–4.6)

## 2022-07-17 LAB — TRIGLYCERIDES: Triglycerides: 107 mg/dL (ref <150)

## 2022-07-17 MED ORDER — FIDAXOMICIN 200 MG PO TABS
200.0000 mg | ORAL_TABLET | Freq: Two times a day (BID) | ORAL | Status: AC
  Administered 2022-07-17 – 2022-07-26 (×20): 200 mg via ORAL
  Filled 2022-07-17 (×21): qty 1

## 2022-07-17 MED ORDER — HYDROCORTISONE 1 % EX LOTN
TOPICAL_LOTION | CUTANEOUS | Status: DC | PRN
  Filled 2022-07-17: qty 118

## 2022-07-17 MED ORDER — TRAVASOL 10 % IV SOLN
INTRAVENOUS | Status: AC
  Filled 2022-07-17: qty 648

## 2022-07-17 MED ORDER — MELATONIN 3 MG PO TABS
3.0000 mg | ORAL_TABLET | Freq: Once | ORAL | Status: AC
  Administered 2022-07-17: 3 mg via ORAL
  Filled 2022-07-17: qty 1

## 2022-07-17 MED ORDER — ENSURE ENLIVE PO LIQD
237.0000 mL | Freq: Three times a day (TID) | ORAL | Status: DC
  Administered 2022-07-17 – 2022-07-27 (×14): 237 mL via ORAL

## 2022-07-17 MED ORDER — THIAMINE HCL 100 MG/ML IJ SOLN
100.0000 mg | Freq: Every day | INTRAMUSCULAR | Status: DC
  Administered 2022-07-18 – 2022-07-19 (×2): 100 mg via INTRAVENOUS
  Filled 2022-07-17 (×2): qty 2

## 2022-07-17 MED ORDER — DIPHENHYDRAMINE HCL 25 MG PO CAPS
25.0000 mg | ORAL_CAPSULE | Freq: Once | ORAL | Status: AC
  Administered 2022-07-17: 25 mg via ORAL
  Filled 2022-07-17: qty 1

## 2022-07-17 MED ORDER — POTASSIUM PHOSPHATES 15 MMOLE/5ML IV SOLN
30.0000 mmol | Freq: Once | INTRAVENOUS | Status: AC
  Administered 2022-07-17: 30 mmol via INTRAVENOUS
  Filled 2022-07-17: qty 10

## 2022-07-17 NOTE — Care Management Important Message (Signed)
Important Message  Patient Details IM Letter given to the Patient Name: Jenny Glass MRN: [id number] Date of Birth: [birth year]   Medicare Important Message Given:  Yes     Jenny Glass, [place] 07/17/2022, 10:22 AM

## 2022-07-17 NOTE — Progress Notes (Signed)
Nutrition Follow-up  DOCUMENTATION CODES:   Non-severe (moderate) malnutrition in context of acute illness/injury  INTERVENTION:   Monitor magnesium, potassium, and phosphorus BID for at least 3 days, MD to replete as needed, as pt is at risk for refeeding syndrome.  -TPN management per Pharmacy -Recommend 100 mg Thiamine x 5 days given refeeding risk and persistent diarrhea  -Ensure Plus High Protein po BID, each supplement provides 350 kcal and 20 grams of protein.   NUTRITION DIAGNOSIS:   Moderate Malnutrition related to acute illness as evidenced by mild fat depletion, moderate muscle depletion, energy intake < 75% for > 7 days.  Ongoing.  GOAL:   Patient will meet greater than or equal to 90% of their needs  Progressing with TPN  MONITOR:   PO intake, Supplement acceptance, Labs, Weight trends, I & O's (TPN)  ASSESSMENT:   71 year old female with history of polymyalgia rheumatica, carotid artery stenosis left greater than right, hepatic hemangioma, hyperlipidemia, aortic atherosclerosis, hypertension, chronic back pain presented to the ED with abdominal pain left lower quadrant and suprapubic with nausea x3 days with associated chills, possible fever at home but no vomiting or diarrhea.  Pt's diet was advanced to full liquids today. Ensure supplements have been ordered.  Possible surgery plans are still pending. Pt having c.diff checked.  TPN advancing to 60 ml/hr today, providing 1425 kcals and 64g protein.  Medications: Calcium carbonate, Folic acid, K-Phos  Weight was recorded 9/11:159 lbs. This is  -4 lbs since 8/16. Pt has lost 11 lbs  from UBW of 170 lbs.  Labs reviewed:  CBGs: 118-140 Low K Low Phos TG 107  Diet Order:   Diet Order             Diet full liquid Room service appropriate? Yes; Fluid consistency: Thin  Diet effective now                   EDUCATION NEEDS:   Education needs have been addressed  Skin:  Skin Assessment: Reviewed  RN Assessment  Last BM:  9/11 -type 7  Height:   Ht Readings from Last 1 Encounters:  06/20/22 '5\' 3"'$  (1.6 m)    Weight:   Wt Readings from Last 1 Encounters:  07/16/22 72.3 kg   BMI:  Body mass index is 28.22 kg/m.  Estimated Nutritional Needs:   Kcal:  1800-2000  Protein:  80-90g  Fluid:  2L/day  Clayton Bibles, MS, RD, LDN Inpatient Clinical Dietitian Contact information available via Amion

## 2022-07-17 NOTE — Progress Notes (Signed)
Mobility Specialist Cancellation/Refusal Note:   Reason for Cancellation/Refusal: Pt declined mobility at this time. Pt having issues w/ bowel Will check back tomorrow.    [facility]

## 2022-07-17 NOTE — Progress Notes (Signed)
PHARMACY - TOTAL PARENTERAL NUTRITION CONSULT NOTE   Indication: sigmoid colon diverticulitis with abscess resulting in intolerance to enteral feeding  Patient Measurements: Weight: 72.3 kg (159 lb 5.2 oz) Body mass index is 28.22 kg/m. Usual Weight: 170 lbs  Assessment:  Pt is a 70 yoF with a PMH significant for polymyalgia rheumatica, carotid artery stenosis, hepatic hemangioma, aortic atherosclerosis, HTN, and chronic back pain who presented to the ED 9/6 with LLQ abdominal pain, suprapubic pain, nausea, and chills x 3 days. CT abdomen pelvis revealed acute sigmoid colon diverticulitis and abscess. TPN indicated for prolonged inability to tolerate enteral feeds and associated malnutrition. TPN initiated on day 5 of admission after failing to improve on antibiotics alone.  Glucose / Insulin: no hx DM; CBG 118 this am, 2 units SSI given Electrolytes: K+ low at 3.1, Chloride elevated at 112, Phos low at 2.3; all other lytes WNL Renal: SCr decreased to 0.67 (CrCl 63.3 mL/min), BUN low Hepatic: AST, ALT, Tbili, alk phos WNL; albumin low at 3.1 Intake / Output: net +520 mL yesterday, no output charted MIVF: NS @ 10 mL/hr GI Imaging: - 9/6 CT showed acute sigmoid colon diverticulitis with small peridiverticular abscess/fluid collection - 1.9 cm - 9/10 CT showed similar amount of inflammatory changes around sigmoid colon and slight increase in fluid collection - 2.6 cm  GI Surgeries / Procedures: n/a  Central access: double lumen PICC TPN start date: 07/16/22  Nutritional Goals: Goal TPN rate is 80 mL/hr (provides 86.4 g of protein and 1900 kcals per day)  RD Assessment: Estimated Needs Total Energy Estimated Needs: 1800-2000 Total Protein Estimated Needs: 80-90g Total Fluid Estimated Needs: 2L/day  Current Nutrition:  Clear liquids  Plan:  - Advance TPN to 60 mL/hr at 1800 (provides 64.8 g protein, 1425 kcal) - Supplement lytes with potassium phosphate 30 mmol IV x 1 dose -  Electrolytes in TPN: Na 50mEq/L, K 50mEq/L, Ca 5mEq/L, Mg 5mEq/L, and Phos 15mmol/L. Cl:Ac 1:1 - Add standard MVI and trace elements to TPN Continue Sensitive q6h SSI and adjust as needed  - Continue MIVF at 10 mL/hr - Monitor TPN labs on Mon/Thurs - Monitor daily BMP, Mg, Phos for first 3 days of TPN - Surgery anticipated later this week   , PharmD Candidate 07/17/2022 8:39 AM  

## 2022-07-17 NOTE — Progress Notes (Signed)
PROGRESS NOTE Jenny Glass  VFI:[id number] DOB: December 10, 1950 DOA: 07/11/2022 PCP: Merrilee Seashore, MD   Brief Narrative/Hospital Course: 71 year old female with history of polymyalgia rheumatica, carotid artery stenosis left greater than right, hepatic hemangioma, hyperlipidemia, aortic atherosclerosis, hypertension, chronic back pain presented to the ED with abdominal pain left lower quadrant and suprapubic with nausea x3 days with associated chills, possible fever at home but no vomiting or diarrhea.  Seen at PCP office and instructed to come to the ED. In the ED vitals stable, underwent lab work that showed mild leukocytosis abnormal UA, CT abdomen pelvis with acute sigmoid colon diverticulitis small peridiverticular abscess/fluid collection 1.9 cm.  Patient was given LR 1 L, Rocephin 1 g, Flagyl, lactic acid ordered and admission requested.  Seen by surgery, antibiotic changed to Zosyn for complicated diverticulitis-being managed conservatively.  Initial Flagyl Rocephin/and switched to Zosyn > then to [facility] 07/14/22.    Subjective:  Seen and examined. Still complains of loose stool. Husband at the bedside Overall she feels better Overnight afebrile. Lab with K3.1   Assessment and Plan: Principal Problem:   Abscess of sigmoid colon due to diverticulitis Active Problems:   Hyperlipidemia   Chronic back pain   Hypertension   Carotid artery stenosis   UTI (urinary tract infection)   Diverticulitis of intestine with abscess   Malnutrition of moderate degree  Acute complicated sigmoid diverticulitis with a small abscess 1.9 cm in CT> increased to 2.6 on:  Patient with ongoing left abdominal pain Zosyn> changed to Invanz 9/9 per surgery.  Had repeat CT on 9/10.  Now started on TPN 9/11.  Overall afebrile, no leukocytosis.  Continue current plan with IV antibiotics checking a stool to rule out C Diff-appreciate surgery input. Discussed further need to have outpatient colonoscopy in 6 to 8  weeks follow-up.  Moderate malnutrition:In the setting of decreased oral intake due to #1, dietitian following, continue TPN  Hypokalemia:We will have pharmacy adjust TPN/supplement/replace.  Loose stool checking C. Difficile per [facility].  Wheezing/shortness of breath  cxr 9/8- any acute finding, on gentle IV fluid rate , encourage incentive spirometry, bronchodilators.  Klebsiella pneumoniae UTI : Has gotten adequate coverage   PMR on simponi inj q6-8 wk,methotrexate inj q wkly, and prednisone. Patient is immunocompromised status Patient is due for injection Simponi agents but will hold off due to patient's acute infection and will need to be reassessed by her rheumatologist. Cont her prednisone  Headache:continue Tylenol prn  Hyperlipidemia Hypertension Carotid artery stenosis: She has no chest pain. BP stable.Cont to hold amlodipine and losartan. Cont crestor 10 mg,   Chronic back pain: cont pain control.   DVT prophylaxis: enoxaparin (LOVENOX) injection 40 mg Start: 07/11/22 2200 Code Status:   Code Status: Full Code Family Communication: plan of care discussed with patient and her husband at bedside. Patient status is: Remains hospitalized for continued IV antibiotics for complicated diverticulitis Level of care: Med-Surg  Dispo: The patient is from: home            Anticipated disposition: home  Mobility Assessment (last 72 hours)     Mobility Assessment     Row Name 07/17/22 0752 07/16/22 2100 07/16/22 0837 07/15/22 2009 07/15/22 0812   Does patient have an order for bedrest or is patient medically unstable No - Continue assessment No - Continue assessment No - Continue assessment No - Continue assessment No - Continue assessment   What is the highest level of mobility based on the progressive mobility assessment? Level 6 (Walks  independently in room and hall) - Balance while walking in room without assist - Complete Level 6 (Walks independently in room and hall) - Balance  while walking in room without assist - Complete Level 6 (Walks independently in room and hall) - Balance while walking in room without assist - Complete Level 6 (Walks independently in room and hall) - Balance while walking in room without assist - Complete Level 6 (Walks independently in room and hall) - Balance while walking in room without assist - Complete    Row Name 07/14/22 2326           Does patient have an order for bedrest or is patient medically unstable No - Continue assessment       What is the highest level of mobility based on the progressive mobility assessment? Level 6 (Walks independently in room and hall) - Balance while walking in room without assist - Complete              Objective: Vitals last 24 hrs: Vitals:   07/16/22 1239 07/16/22 1528 07/16/22 2154 07/17/22 0558  BP:  133/81 134/82 118/83  Pulse:  78 71 65  Resp:  '16 17 17  '$ Temp:  98.4 F (36.9 C) 98.2 F (36.8 C) 98.1 F (36.7 C)  TempSrc:  Oral Oral Oral  SpO2:  99% 95% 95%  Weight: 72.3 kg      Weight change:   Physical Examination: General exam:AAx3,older than stated age, weak appearing. HEENT:Oral mucosa moist, Ear/Nose WNL grossly, dentition normal. Respiratory system: B/L clear,no use of accessory muscle Cardiovascular system: S1 & S2 +, No JVD. Gastrointestinal system: Abdomen soft, tender LLq,ND,BS+ Nervous System:Alert, awake,moving extremities and grossly nonfocal Extremities: LE ankle edema neg,distal peripheral pulses palpable.  Skin: No rashes,no icterus. MSK: Normal muscle bulk,tone, power.   Medications reviewed:  Scheduled Meds:  albuterol  2.5 mg Nebulization Once   calcium carbonate  1 tablet Oral Daily   Chlorhexidine Gluconate Cloth  6 each Topical Daily   enoxaparin (LOVENOX) injection  40 mg Subcutaneous J24Q   folic acid  683 mcg Oral Daily   guaiFENesin  600 mg Oral BID   insulin aspart  0-15 Units Subcutaneous Q6H   predniSONE  2 mg Oral Q breakfast    rosuvastatin  10 mg Oral Daily   sodium chloride flush  10-40 mL Intracatheter Q12H  Continuous Infusions:  sodium chloride 10 mL/hr at 07/16/22 1830   ertapenem 1 g (07/16/22 1015)   potassium PHOSPHATE IVPB (in mmol) 30 mmol (07/17/22 0850)   TPN ADULT (ION) 40 mL/hr at 07/16/22 1741   TPN ADULT (ION)      Diet Order             Diet full liquid Room service appropriate? Yes; Fluid consistency: Thin  Diet effective now                  Intake/Output Summary (Last 24 hours) at 07/17/2022 0913 Last data filed at 07/16/2022 1500 Gross per 24 hour  Intake 520 ml  Output --  Net 520 ml    Net IO Since Admission: 2,337.6 mL [07/17/22 0913]  Wt Readings from Last 3 Encounters:  07/16/22 72.3 kg  06/20/22 74.4 kg  05/01/22 75.4 kg     Unresulted Labs (From admission, onward)     Start     Ordered   07/19/22 0500  Comprehensive metabolic panel  (TPN Lab Panel)  Every Mon,Thu (0500),   R  Question:  Specimen collection method  Answer:  Lab=Lab collect   07/16/22 1104   07/19/22 0500  Magnesium  (TPN Lab Panel)  Every Mon,Thu (0500),   R     Question:  Specimen collection method  Answer:  Lab=Lab collect   07/16/22 1104   07/19/22 0500  Phosphorus  (TPN Lab Panel)  Every Mon,Thu (0500),   R     Question:  Specimen collection method  Answer:  Lab=Lab collect   07/16/22 1104   07/19/22 0500  Triglycerides  (TPN Lab Panel)  Every Mon,Thu (0500),   R     Question:  Specimen collection method  Answer:  Lab=Lab collect   07/16/22 1104   07/18/22 0500  Creatinine, serum  (enoxaparin (LOVENOX)    CrCl >/= 30 ml/min)  Weekly,   R     Comments: while on enoxaparin therapy    07/11/22 1740   07/17/22 0754  C Difficile Quick Screen (NO PCR Reflex)  (C Difficile Quick Screen (NO PCR Reflex) panel)  Once, for 24 hours,   TIMED       References:    CDiff Information Tool  Question Answer Comment  Is your patient experiencing watery stools (3 or more in 24 hours)? Yes   Does the  patient have signs/symptoms: Temperature >100.40F; abdominal pain, cramping, tenderness/distention or  leukocytosis. Yes   Approving Physician Name (See AMION: C-Diff Consultation) Verlin Grills      07/17/22 0754   07/14/22 0500  CBC  Daily at 5am,   R     Question:  Specimen collection method  Answer:  Lab=Lab collect   07/13/22 0804   07/14/22 6644  Basic metabolic panel  Daily at 5am,   R     Question:  Specimen collection method  Answer:  Lab=Lab collect   07/13/22 0804          Data Reviewed: I have personally reviewed following labs and imaging studies CBC: Recent Labs  Lab 07/13/22 0625 07/14/22 0751 07/15/22 0605 07/16/22 0541 07/17/22 0550  WBC 8.0 8.5 6.4 8.1 6.8  HGB 10.5* 10.4* 10.1* 10.5* 10.6*  HCT 34.0* 33.1* 32.6* 32.9* 32.9*  MCV 96.6 95.1 96.4 93.7 91.9  PLT 176 192 216 268 034    Basic Metabolic Panel: Recent Labs  Lab 07/12/22 0708 07/13/22 0625 07/14/22 0751 07/15/22 0605 07/16/22 0541 07/17/22 0550  NA 141 139 139 142 143 142  K 3.3* 3.8 3.6 3.7 3.7 3.1*  CL 110 111 108 111 115* 112*  CO2 '24 24 23 23 '$ 18* 22  GLUCOSE 93 96 92 71 70 121*  BUN 8 6* 6* 7* 6* 6*  CREATININE 0.99 0.94 0.89 0.82 0.93 0.67  CALCIUM 8.0* 7.7* 8.2* 8.2* 8.5* 8.5*  MG 1.8  --   --   --   --  2.0  PHOS  --   --   --   --   --  2.3*    GFR: Estimated Creatinine Clearance: 63.3 mL/min (by C-G formula based on SCr of 0.67 mg/dL). Liver Function Tests: Recent Labs  Lab 07/11/22 1446 07/12/22 0708 07/17/22 0550  AST 13* 9* 17  ALT '11 9 15  '$ ALKPHOS 55 47 54  BILITOT 0.9 0.9 0.6  PROT 7.3 6.0* 6.3*  ALBUMIN 3.9 3.1* 3.1*     Recent Results (from the past 240 hour(s))  Urine Culture     Status: Abnormal   Collection Time: 07/11/22  2:46 PM   Specimen: Urine, Clean Catch  Result Value Ref Range Status   Specimen Description   Final    URINE, CLEAN CATCH Performed at [facility], [place] [address] [place], [place] [address]    Special  Requests   Final    NONE Performed at [facility], [place] [address], [place], [place] [address]    Culture >=100,000 COLONIES/mL KLEBSIELLA PNEUMONIAE (A)  Final   Report Status 07/13/2022 FINAL  Final   Organism ID, Bacteria KLEBSIELLA PNEUMONIAE (A)  Final      Susceptibility   Klebsiella pneumoniae - MIC*    AMPICILLIN RESISTANT Resistant     CEFAZOLIN <=4 SENSITIVE Sensitive     CEFEPIME <=0.12 SENSITIVE Sensitive     CEFTRIAXONE <=0.25 SENSITIVE Sensitive     CIPROFLOXACIN <=0.25 SENSITIVE Sensitive     GENTAMICIN <=1 SENSITIVE Sensitive     IMIPENEM <=0.25 SENSITIVE Sensitive     NITROFURANTOIN 64 INTERMEDIATE Intermediate     TRIMETH/SULFA <=20 SENSITIVE Sensitive     AMPICILLIN/SULBACTAM 4 SENSITIVE Sensitive     PIP/TAZO <=4 SENSITIVE Sensitive     * >=100,000 COLONIES/mL KLEBSIELLA PNEUMONIAE    Antimicrobials: Anti-infectives (From admission, onward)    Start     Dose/Rate Route Frequency Ordered Stop   07/14/22 1100  ertapenem [facility]) injection 1,000 mg  Status:  Discontinued        1 g Intramuscular Daily 07/14/22 0935 07/14/22 1000   07/14/22 1100  ertapenem (INVANZ) 1,000 mg in sodium chloride 0.9 % 100 mL IVPB        1 g 200 mL/hr over 30 Minutes Intravenous Daily 07/14/22 1002     07/14/22 1030  ciprofloxacin (CIPRO) IVPB 400 mg  Status:  Discontinued        400 mg 200 mL/hr over 60 Minutes Intravenous Every 12 hours 07/14/22 0934 07/14/22 0935   07/14/22 1030  metroNIDAZOLE (FLAGYL) IVPB 500 mg  Status:  Discontinued        500 mg 100 mL/hr over 60 Minutes Intravenous Every 8 hours 07/14/22 0934 07/14/22 0935   07/12/22 1800  cefTRIAXone (ROCEPHIN) 2 g in sodium chloride 0.9 % 100 mL IVPB  Status:  Discontinued        2 g 200 mL/hr over 30 Minutes Intravenous Every 24 hours 07/11/22 1759 07/12/22 0836   07/12/22 0900  piperacillin-tazobactam (ZOSYN) IVPB 3.375 g  Status:  Discontinued        3.375 g 12.5 mL/hr over 240 Minutes Intravenous  Every 8 hours 07/12/22 0836 07/14/22 0934   07/11/22 1800  cefTRIAXone (ROCEPHIN) 1 g in sodium chloride 0.9 % 100 mL IVPB        1 g 200 mL/hr over 30 Minutes Intravenous  Once 07/11/22 1759 07/11/22 2300   07/11/22 1730  metroNIDAZOLE (FLAGYL) IVPB 500 mg  Status:  Discontinued        500 mg 100 mL/hr over 60 Minutes Intravenous Every 12 hours 07/11/22 1726 07/12/22 0836   07/11/22 1545  cefTRIAXone (ROCEPHIN) 1 g in sodium chloride 0.9 % 100 mL IVPB        1 g 200 mL/hr over 30 Minutes Intravenous  Once 07/11/22 1540 07/11/22 1716      Culture/Microbiology    Component Value Date/Time   SDES  07/11/2022 1446    URINE, CLEAN CATCH Performed at [facility], [place] [address], [place], [place] [address]    SPECREQUEST  07/11/2022 1446    NONE Performed at [facility], [place]  [address] [place],  [address]    CULT >=100,000 COLONIES/mL KLEBSIELLA PNEUMONIAE (A) 07/11/2022 1446   REPTSTATUS 07/13/2022 FINAL 07/11/2022 1446   Radiology Studies: [place] EKG SITE RITE  Result Date: 07/16/2022 If Site Rite image not attached, placement could not be confirmed due to current cardiac rhythm.  CT ABDOMEN PELVIS W CONTRAST  Result Date: 07/15/2022 CLINICAL DATA:  Follow-up diverticulitis and diverticular abscess. EXAM: CT ABDOMEN AND PELVIS WITH CONTRAST TECHNIQUE: Multidetector CT imaging of the abdomen and pelvis was performed using the standard protocol following bolus administration of intravenous contrast. RADIATION DOSE REDUCTION: This exam was performed according to the departmental dose-optimization program which includes automated exposure control, adjustment of the mA and/or kV according to patient size and/or use of iterative reconstruction technique. CONTRAST:  167m OMNIPAQUE IOHEXOL 300 MG/ML  SOLN COMPARISON:  07/11/2022 FINDINGS: Lower Chest: No acute findings. Hepatobiliary: Stable 2 cm benign hemangioma in segment 2 of the left hepatic  lobe. Stable 9 mm cyst in the inferior right hepatic lobe. No new or enlarging liver lesions are identified. Gallbladder is unremarkable. No evidence of biliary ductal dilatation. Pancreas:  No mass or inflammatory changes. Spleen: Within normal limits in size and appearance. Adrenals/Urinary Tract: No suspicious masses identified. Moderate chronic right renal parenchymal scarring is again noted. No evidence of ureteral calculi. Mild bilateral hydroureteronephrosis has increased since previous study due to involvement by diverticulitis described below. Stomach/Bowel: Stable moderate hiatal hernia. Moderate sigmoid diverticulitis shows no significant change since prior study. A small rim enhancing gas and fluid collection in the central sigmoid mesentery currently measures 2.6 x 1.4 cm, compared to 2.3 x 1.1 cm previously. No other abscess or free fluid identified. Vascular/Lymphatic: No pathologically enlarged lymph nodes. No acute vascular findings. Reproductive:  No mass or other significant abnormality. Other:  None. Musculoskeletal:  No suspicious bone lesions identified. IMPRESSION: No significant change in moderate sigmoid diverticulitis. Slight increase in size of 2.6 cm diverticular abscess in central sigmoid mesentery. Increased mild bilateral hydroureteronephrosis due to involvement of both ureters by diverticulitis. Stable moderate hiatal hernia. Electronically Signed   By: JMarlaine HindM.D.   On: 07/15/2022 12:26     LOS: 5 days   RAntonieta Pert MD [facility]  07/17/2022, 9:13 AM

## 2022-07-17 NOTE — Progress Notes (Signed)
C.dif antigen positive, toxin negative. Reviewed with ID physican on call. Given symptomatic disease (watery diarrhea/incontinence) and first episode, recommend 10 days of fidaxomicin. I ordered this to start today.    Obie Dredge, PA-C [facility] Please see Amion for pager number during day hours 7:00am-4:30pm

## 2022-07-17 NOTE — Progress Notes (Signed)
[facility] Progress Note     Subjective: CC-  Ongoing LLQ discomfort and loose stools. Tolerating sips of clears without N/V. Not drinking much because she doesn't want to have loose stools. Got OOB to chair yesterday.   Objective: Vital signs in last 24 hours: Temp:  [98.1 F (36.7 C)-98.4 F (36.9 C)] 98.1 F (36.7 C) (09/12 0558) Pulse Rate:  [65-78] 65 (09/12 0558) Resp:  [16-17] 17 (09/12 0558) BP: (118-134)/(81-83) 118/83 (09/12 0558) SpO2:  [95 %-99 %] 95 % (09/12 0558) Weight:  [72.3 kg] 72.3 kg (09/11 1239) Last BM Date : 07/16/22  Intake/Output from previous day: 09/11 0701 - 09/12 0700 In: 520 [P.O.:420; IV Piggyback:100] Out: -  Intake/Output this shift: No intake/output data recorded.  PE: Gen:  Alert, NAD, pleasant Looks overall less fatigued Cardio: RRR Pulm: CTAB, no wheezing, rate and effort normal  Abd: soft, ND, mildly TTP in LLW without guarding. No rebound.  Lab Results:  Recent Labs    07/16/22 0541 07/17/22 0550  WBC 8.1 6.8  HGB 10.5* 10.6*  HCT 32.9* 32.9*  PLT 268 287   BMET Recent Labs    07/16/22 0541 07/17/22 0550  NA 143 142  K 3.7 3.1*  CL 115* 112*  CO2 18* 22  GLUCOSE 70 121*  BUN 6* 6*  CREATININE 0.93 0.67  CALCIUM 8.5* 8.5*   PT/INR No results for input(s): "LABPROT", "INR" in the last 72 hours. CMP     Component Value Date/Time   NA 142 07/17/2022 0550   K 3.1 (L) 07/17/2022 0550   CL 112 (H) 07/17/2022 0550   CO2 22 07/17/2022 0550   GLUCOSE 121 (H) 07/17/2022 0550   BUN 6 (L) 07/17/2022 0550   CREATININE 0.67 07/17/2022 0550   CREATININE 1.00 02/16/2012 1509   CALCIUM 8.5 (L) 07/17/2022 0550   PROT 6.3 (L) 07/17/2022 0550   ALBUMIN 3.1 (L) 07/17/2022 0550   AST 17 07/17/2022 0550   ALT 15 07/17/2022 0550   ALKPHOS 54 07/17/2022 0550   BILITOT 0.6 07/17/2022 0550   GFRNONAA >60 07/17/2022 0550   GFRAA 46 (L) 01/10/2019 2103   Lipase     Component Value Date/Time   LIPASE 23  07/11/2022 1446       Studies/Results: [place] EKG SITE RITE  Result Date: 07/16/2022 If Site Rite image not attached, placement could not be confirmed due to current cardiac rhythm.  CT ABDOMEN PELVIS W CONTRAST  Result Date: 07/15/2022 CLINICAL DATA:  Follow-up diverticulitis and diverticular abscess. EXAM: CT ABDOMEN AND PELVIS WITH CONTRAST TECHNIQUE: Multidetector CT imaging of the abdomen and pelvis was performed using the standard protocol following bolus administration of intravenous contrast. RADIATION DOSE REDUCTION: This exam was performed according to the departmental dose-optimization program which includes automated exposure control, adjustment of the mA and/or kV according to patient size and/or use of iterative reconstruction technique. CONTRAST:  120m OMNIPAQUE IOHEXOL 300 MG/ML  SOLN COMPARISON:  07/11/2022 FINDINGS: Lower Chest: No acute findings. Hepatobiliary: Stable 2 cm benign hemangioma in segment 2 of the left hepatic lobe. Stable 9 mm cyst in the inferior right hepatic lobe. No new or enlarging liver lesions are identified. Gallbladder is unremarkable. No evidence of biliary ductal dilatation. Pancreas:  No mass or inflammatory changes. Spleen: Within normal limits in size and appearance. Adrenals/Urinary Tract: No suspicious masses identified. Moderate chronic right renal parenchymal scarring is again noted. No evidence of ureteral calculi. Mild bilateral hydroureteronephrosis has increased since previous study due to involvement  by diverticulitis described below. Stomach/Bowel: Stable moderate hiatal hernia. Moderate sigmoid diverticulitis shows no significant change since prior study. A small rim enhancing gas and fluid collection in the central sigmoid mesentery currently measures 2.6 x 1.4 cm, compared to 2.3 x 1.1 cm previously. No other abscess or free fluid identified. Vascular/Lymphatic: No pathologically enlarged lymph nodes. No acute vascular findings. Reproductive:  No  mass or other significant abnormality. Other:  None. Musculoskeletal:  No suspicious bone lesions identified. IMPRESSION: No significant change in moderate sigmoid diverticulitis. Slight increase in size of 2.6 cm diverticular abscess in central sigmoid mesentery. Increased mild bilateral hydroureteronephrosis due to involvement of both ureters by diverticulitis. Stable moderate hiatal hernia. Electronically Signed   By: Marlaine Hind M.D.   On: 07/15/2022 12:26    Anti-infectives: Anti-infectives (From admission, onward)    Start     Dose/Rate Route Frequency Ordered Stop   07/14/22 1100  ertapenem [facility]) injection 1,000 mg  Status:  Discontinued        1 g Intramuscular Daily 07/14/22 0935 07/14/22 1000   07/14/22 1100  ertapenem (INVANZ) 1,000 mg in sodium chloride 0.9 % 100 mL IVPB        1 g 200 mL/hr over 30 Minutes Intravenous Daily 07/14/22 1002     07/14/22 1030  ciprofloxacin (CIPRO) IVPB 400 mg  Status:  Discontinued        400 mg 200 mL/hr over 60 Minutes Intravenous Every 12 hours 07/14/22 0934 07/14/22 0935   07/14/22 1030  metroNIDAZOLE (FLAGYL) IVPB 500 mg  Status:  Discontinued        500 mg 100 mL/hr over 60 Minutes Intravenous Every 8 hours 07/14/22 0934 07/14/22 0935   07/12/22 1800  cefTRIAXone (ROCEPHIN) 2 g in sodium chloride 0.9 % 100 mL IVPB  Status:  Discontinued        2 g 200 mL/hr over 30 Minutes Intravenous Every 24 hours 07/11/22 1759 07/12/22 0836   07/12/22 0900  piperacillin-tazobactam (ZOSYN) IVPB 3.375 g  Status:  Discontinued        3.375 g 12.5 mL/hr over 240 Minutes Intravenous Every 8 hours 07/12/22 0836 07/14/22 0934   07/11/22 1800  cefTRIAXone (ROCEPHIN) 1 g in sodium chloride 0.9 % 100 mL IVPB        1 g 200 mL/hr over 30 Minutes Intravenous  Once 07/11/22 1759 07/11/22 2300   07/11/22 1730  metroNIDAZOLE (FLAGYL) IVPB 500 mg  Status:  Discontinued        500 mg 100 mL/hr over 60 Minutes Intravenous Every 12 hours 07/11/22 1726 07/12/22 0836    07/11/22 1545  cefTRIAXone (ROCEPHIN) 1 g in sodium chloride 0.9 % 100 mL IVPB        1 g 200 mL/hr over 30 Minutes Intravenous  Once 07/11/22 1540 07/11/22 1716        Assessment/Plan Diverticulitis with small abscess - afebrile, normal WBC, on steroids/MTX - 1st bout, last colonoscopy 2013 - CT scan 9/6 shows Acute sigmoid colon diverticulitis with small peridiverticular abscess/fluid collection, 1.9 cm in greatest dimension - CT 9/10 w/ similar amount of inflammatory changes around sigmoid colon and slight increase in fluid collection - 2.6cm  - allow FLD + Ensure (pt not taking in much), TPN, monitor abd exam. - no emergent surgical needs. Patients CT scan is stable and clinically she is not worse, but not significantly improved per her history. Hopefully she will show improvement on ertapenem. Check c.dif today. Failure to improve may warrant laparotomy,  partial colectomy, end colostomy. She would be at increased risk for issues with wound healing and post-operative complications given her use of immunosuppressive therapies/steroids.   ID - rocephin/flagyl 9/6>>9/7, zosyn 9/7-9/9, ertapenem 9/9 >> FEN - FLD, ensure PICC/TPN VTE - SCDs, lovenox Foley - none   Dispo: check c.dif, allow FLD.   Klebsiella pneumoniae UTI  PMR on simponi/prednisone/MTX HTN HLD CAD Chronic back pain   I reviewed last 24 h vitals and pain scores, last 48 h intake and output, last 24 h labs and trends. Discussed with hospitalist.      LOS: 5 days   Obie Dredge, [facility] Surgery Please see Amion for pager number during day hours 7:00am-4:30pm

## 2022-07-18 ENCOUNTER — Telehealth (HOSPITAL_COMMUNITY): Payer: Self-pay | Admitting: Pharmacy Technician

## 2022-07-18 ENCOUNTER — Other Ambulatory Visit (HOSPITAL_COMMUNITY): Payer: Self-pay

## 2022-07-18 DIAGNOSIS — K5720 Diverticulitis of large intestine with perforation and abscess without bleeding: Secondary | ICD-10-CM | POA: Diagnosis not present

## 2022-07-18 LAB — CBC
HCT: 32.9 % — ABNORMAL LOW (ref 36.0–46.0)
Hemoglobin: 10.6 g/dL — ABNORMAL LOW (ref 12.0–15.0)
MCH: 29.8 pg (ref 26.0–34.0)
MCHC: 32.2 g/dL (ref 30.0–36.0)
MCV: 92.4 fL (ref 80.0–100.0)
Platelets: 287 10*3/uL (ref 150–400)
RBC: 3.56 MIL/uL — ABNORMAL LOW (ref 3.87–5.11)
RDW: 14.6 % (ref 11.5–15.5)
WBC: 7.3 10*3/uL (ref 4.0–10.5)
nRBC: 0 % (ref 0.0–0.2)

## 2022-07-18 LAB — BASIC METABOLIC PANEL
Anion gap: 5 (ref 5–15)
BUN: 13 mg/dL (ref 8–23)
CO2: 30 mmol/L (ref 22–32)
Calcium: 8.4 mg/dL — ABNORMAL LOW (ref 8.9–10.3)
Chloride: 109 mmol/L (ref 98–111)
Creatinine, Ser: 0.75 mg/dL (ref 0.44–1.00)
GFR, Estimated: >60 mL/min (ref >60)
Glucose, Bld: 107 mg/dL — ABNORMAL HIGH (ref 70–99)
Potassium: 3.5 mmol/L (ref 3.5–5.1)
Sodium: 144 mmol/L (ref 135–145)

## 2022-07-18 LAB — MAGNESIUM: Magnesium: 2.1 mg/dL (ref 1.7–2.4)

## 2022-07-18 LAB — GLUCOSE, CAPILLARY
Glucose-Capillary: 111 mg/dL — ABNORMAL HIGH (ref 70–99)
Glucose-Capillary: 134 mg/dL — ABNORMAL HIGH (ref 70–99)
Glucose-Capillary: 115 mg/dL — ABNORMAL HIGH (ref 70–99)

## 2022-07-18 LAB — PHOSPHORUS: Phosphorus: 4.8 mg/dL — ABNORMAL HIGH (ref 2.5–4.6)

## 2022-07-18 MED ORDER — TRAVASOL 10 % IV SOLN
INTRAVENOUS | Status: AC
  Filled 2022-07-18: qty 648

## 2022-07-18 NOTE — Progress Notes (Signed)
PHARMACY - TOTAL PARENTERAL NUTRITION CONSULT NOTE   Indication: sigmoid colon diverticulitis with abscess resulting in intolerance to enteral feeding  Patient Measurements: Weight: 72.3 kg (159 lb 5.2 oz) Body mass index is 28.22 kg/m. Usual Weight: 170 lbs  Assessment:  Pt is a 66 yoF with a PMH significant for polymyalgia rheumatica, carotid artery stenosis, hepatic hemangioma, aortic atherosclerosis, HTN, and chronic back pain who presented to the ED 9/6 with LLQ abdominal pain, suprapubic pain, nausea, and chills x 3 days. CT abdomen pelvis revealed acute sigmoid colon diverticulitis and abscess. TPN indicated for prolonged inability to tolerate enteral feeds and associated malnutrition. TPN initiated on day 5 of admission after failing to improve on antibiotics alone.  Today, 07/18/22: - Diet was advanced to full liquids (with Ensure supplements) yesterday - Pt continues to have loose stool, but this has improved to 2-3 BM/day - C. Diff antigen positive, toxin negative - starting fidaxomicin   Glucose / Insulin: no hx DM; CBGs 111-170, 5 total units of insulin aspart given Electrolytes: K+ now WNL, Phos high at 4.8; all other lytes WNL Renal: Scr stable at 0.75; BUN now WNL Hepatic: AST, ALT, Tbili, alk phos WNL Intake / Output: net +1,788 mL; charting not accurate; noted as drinking 1-2 Ensure yesterday MIVF: NS @ 10 mL/hr GI Imaging: - 9/6 CT showed acute sigmoid colon diverticulitis with small peridiverticular abscess/fluid collection - 1.9 cm - 9/10 CT showed similar amount of inflammatory changes around sigmoid colon and slight increase in fluid collection - 2.6 cm  GI Surgeries / Procedures: n/a  Central access: double lumen PICC TPN start date: 07/16/22  Nutritional Goals: Goal TPN rate is 80 mL/hr (provides 86.4 g of protein and 1900 kcals per day)  RD Assessment: Estimated Needs Total Energy Estimated Needs: 1800-2000 Total Protein Estimated Needs: 80-90g Total  Fluid Estimated Needs: 2L/day  Current Nutrition:  Full liquids  Plan:  - Continue TPN at 60 mL/hr rate (provides 64.8 g protein, 1425 kcal) given advancing enteral feeding - Electrolytes in TPN: Na 21mq/L, K 590m/L, Ca 35m18mL, Mg 35mE38m - Lowering Phos to 35mmo71m - Add standard MVI and trace elements to TPN - Initiate thiamine 100 mg IV x 5 days per RD recs - Continue Sensitive q6h SSI and adjust as needed  - Continue MIVF at 10 mL/hr - Monitor TPN labs on Mon/Thurs - Monitor daily BMP, Mg, Phos for first 3 days of TPN - Per surgery note, if she tolerates her full liquid diet then will start weaning the TPN down in next 1-2 days  HannaCannon KettlermD Candidate 07/18/2022 10:40 AM

## 2022-07-18 NOTE — Telephone Encounter (Signed)
Pharmacy Patient Advocate Encounter  Insurance verification completed.    The patient is insured through [employer] Part D   The patient is currently admitted and ran test claims for the following: Dificid, Vancomcycin.  Copays and coinsurance results were relayed to Inpatient clinical team.

## 2022-07-18 NOTE — Progress Notes (Signed)
PROGRESS NOTE Jenny Glass  MAU:[id number] DOB: [birth year] DOA: 07/11/2022 PCP: Merrilee Seashore, MD   Brief Narrative/Hospital Course: 71 year old female with history of polymyalgia rheumatica, carotid artery stenosis left greater than right, hepatic hemangioma, hyperlipidemia, aortic atherosclerosis, hypertension, chronic back pain presented to the ED with abdominal pain left lower quadrant and suprapubic with nausea x3 days with associated chills, possible fever at home but no vomiting or diarrhea.  Seen at PCP office and instructed to come to the ED. In the ED vitals stable, underwent lab work that showed mild leukocytosis abnormal UA, CT abdomen pelvis with acute sigmoid colon diverticulitis with small peridiverticular abscess/fluid collection 1.9 cm.  Patient was given LR 1 L, Rocephin 1 g, Flagyl, lactic acid ordered and admission requested.  Seen by surgery, antibiotic changed to Zosyn for complicated diverticulitis-being managed conservatively.  Initial Flagyl Rocephin/and switched to Zosyn > then to [facility] 07/14/22.  Remains in the hospital due to persistent abdominal pain, unable to tolerate oral intake, TPN.   Subjective:  Patient seen and examined.  Complains of severe crampings at times.  2-3 loose bowel movements last 24 hours, husband tells me this is better than previous 24 hours.  Denies any nausea or vomiting but has no appetite.  Afebrile.  Tolerating TPN.   Assessment and Plan: Principal Problem:   Abscess of sigmoid colon due to diverticulitis Active Problems:   Hyperlipidemia   Chronic back pain   Hypertension   Carotid artery stenosis   UTI (urinary tract infection)   Diverticulitis of intestine with abscess   Malnutrition of moderate degree  Acute complicated sigmoid diverticulitis with a small abscess 1.9 cm in CT> increased to 2.6. Attempting conservative management to avoid surgery and colostomy.  Some clinical improvement today. Full liquid diet Adequate pain  medications TPN to continue until she tolerates full liquid diet On different antibiotics currently on Invanz will continue until clinical improvement. Loose stool prompted C. difficile testing, antigen positive for C. difficile but toxin is negative. Surgery recommended treatment with Dificid due to symptomatic disease though toxin is negative. Mobilize.  Moderate malnutrition:In the setting of decreased oral intake due to #1, dietitian following, continue TPN   Wheezing/shortness of breath  cxr 9/8- any acute finding, on gentle IV fluid rate , encourage incentive spirometry, bronchodilators.  Klebsiella pneumoniae UTI : Has gotten adequate coverage   PMR on simponi inj q6-8 wk,methotrexate inj q wkly, and prednisone. Patient is immunocompromised status Patient is due for injection Simponi agents but will hold off due to patient's acute infection and will need to be reassessed by her rheumatologist. Cont her prednisone  Headache:continue Tylenol prn  Hyperlipidemia Hypertension Carotid artery stenosis: She has no chest pain. BP stable.Cont to hold amlodipine and losartan. Cont crestor 10 mg,   Chronic back pain: cont pain control.   DVT prophylaxis: enoxaparin (LOVENOX) injection 40 mg Start: 07/11/22 2200 Code Status:   Code Status: Full Code Family Communication: plan of care discussed with patient and her husband at bedside. Patient status is: Remains hospitalized for continued IV antibiotics for complicated diverticulitis, TPN Level of care: Med-Surg  Dispo: The patient is from: home            Anticipated disposition: home  Mobility Assessment (last 72 hours)     Mobility Assessment     Row Name 07/18/22 0940 07/17/22 2229 07/17/22 1953 07/17/22 0752 07/16/22 2100   Does patient have an order for bedrest or is patient medically unstable No - Continue assessment No -  Continue assessment No - Continue assessment No - Continue assessment No - Continue assessment   What is the  highest level of mobility based on the progressive mobility assessment? Level 6 (Walks independently in room and hall) - Balance while walking in room without assist - Complete Level 6 (Walks independently in room and hall) - Balance while walking in room without assist - Complete Level 6 (Walks independently in room and hall) - Balance while walking in room without assist - Complete Level 6 (Walks independently in room and hall) - Balance while walking in room without assist - Complete Level 6 (Walks independently in room and hall) - Balance while walking in room without assist - Complete    Row Name 07/16/22 0837 07/15/22 2009         Does patient have an order for bedrest or is patient medically unstable No - Continue assessment No - Continue assessment      What is the highest level of mobility based on the progressive mobility assessment? Level 6 (Walks independently in room and hall) - Balance while walking in room without assist - Complete Level 6 (Walks independently in room and hall) - Balance while walking in room without assist - Complete             Objective: Vitals last 24 hrs: Vitals:   07/16/22 2154 07/17/22 0558 07/17/22 1400 07/17/22 2226  BP: 134/82 118/83 125/72 128/86  Pulse: 71 65 73 81  Resp: '17 17 16 20  '$ Temp: 98.2 F (36.8 C) 98.1 F (36.7 C) (!) 97.3 F (36.3 C) 98.2 F (36.8 C)  TempSrc: Oral Oral Oral Oral  SpO2: 95% 95% 98% 95%  Weight:       Weight change:   Physical Examination: General: Slightly anxious.  Looks in mild distress.  On room air. Cardiovascular: S1-S2 normal.  Regular rate rhythm. Respiratory: Bilateral clear. Gastrointestinal: Soft.  Mild tenderness along the left lateral quadrants and lower quadrants without rigidity or guarding.  Bowel sound present. Ext: No swelling or edema.  No cyanosis. Neuro: Intact.  Alert oriented x4. Musculoskeletal: No deformities.   Medications reviewed:  Scheduled Meds:  albuterol  2.5 mg  Nebulization Once   calcium carbonate  1 tablet Oral Daily   Chlorhexidine Gluconate Cloth  6 each Topical Daily   enoxaparin (LOVENOX) injection  40 mg Subcutaneous Q24H   feeding supplement  237 mL Oral TID WC   fidaxomicin  200 mg Oral BID   folic acid  557 mcg Oral Daily   guaiFENesin  600 mg Oral BID   insulin aspart  0-15 Units Subcutaneous Q6H   predniSONE  2 mg Oral Q breakfast   rosuvastatin  10 mg Oral Daily   sodium chloride flush  10-40 mL Intracatheter Q12H   thiamine (VITAMIN B1) injection  100 mg Intravenous Daily  Continuous Infusions:  sodium chloride Stopped (07/17/22 0844)   ertapenem 1,000 mg (07/18/22 0947)   TPN ADULT (ION) 60 mL/hr at 07/17/22 1747   TPN ADULT (ION)      Diet Order             Diet full liquid Room service appropriate? Yes; Fluid consistency: Thin  Diet effective now                  Intake/Output Summary (Last 24 hours) at 07/18/2022 1132 Last data filed at 07/17/2022 1811 Gross per 24 hour  Intake 1788.1 ml  Output --  Net 1788.1 ml  Net IO Since Admission: 4,125.7 mL [07/18/22 1132]  Wt Readings from Last 3 Encounters:  07/16/22 72.3 kg  06/20/22 74.4 kg  05/01/22 75.4 kg     Unresulted Labs (From admission, onward)     Start     Ordered   07/19/22 0500  Comprehensive metabolic panel  (TPN Lab Panel)  Every Mon,Thu (0500),   R     Question:  Specimen collection method  Answer:  Lab=Lab collect   07/16/22 1104   07/19/22 0500  Magnesium  (TPN Lab Panel)  Every Mon,Thu (0500),   R     Question:  Specimen collection method  Answer:  Lab=Lab collect   07/16/22 1104   07/19/22 0500  Phosphorus  (TPN Lab Panel)  Every Mon,Thu (0500),   R     Question:  Specimen collection method  Answer:  Lab=Lab collect   07/16/22 1104   07/19/22 0500  Triglycerides  (TPN Lab Panel)  Every Mon,Thu (0500),   R     Question:  Specimen collection method  Answer:  Lab=Lab collect   07/16/22 1104   07/18/22 0500  Creatinine, serum  (enoxaparin  (LOVENOX)    CrCl >/= 30 ml/min)  Weekly,   R     Comments: while on enoxaparin therapy    07/11/22 1740          Data Reviewed: I have personally reviewed following labs and imaging studies CBC: Recent Labs  Lab 07/14/22 0751 07/15/22 0605 07/16/22 0541 07/17/22 0550 07/18/22 0655  WBC 8.5 6.4 8.1 6.8 7.3  HGB 10.4* 10.1* 10.5* 10.6* 10.6*  HCT 33.1* 32.6* 32.9* 32.9* 32.9*  MCV 95.1 96.4 93.7 91.9 92.4  PLT 192 216 268 287 132   Basic Metabolic Panel: Recent Labs  Lab 07/12/22 0708 07/13/22 0625 07/14/22 0751 07/15/22 0605 07/16/22 0541 07/17/22 0550 07/18/22 0655 07/18/22 0940  NA 141   < > 139 142 143 142 144  --   K 3.3*   < > 3.6 3.7 3.7 3.1* 3.5  --   CL 110   < > 108 111 115* 112* 109  --   CO2 24   < > 23 23 18* 22 30  --   GLUCOSE 93   < > 92 71 70 121* 107*  --   BUN 8   < > 6* 7* 6* 6* 13  --   CREATININE 0.99   < > 0.89 0.82 0.93 0.67 0.75  --   CALCIUM 8.0*   < > 8.2* 8.2* 8.5* 8.5* 8.4*  --   MG 1.8  --   --   --   --  2.0  --  2.1  PHOS  --   --   --   --   --  2.3*  --  4.8*   < > = values in this interval not displayed.   GFR: Estimated Creatinine Clearance: 63.3 mL/min (by C-G formula based on SCr of 0.75 mg/dL). Liver Function Tests: Recent Labs  Lab 07/11/22 1446 07/12/22 0708 07/17/22 0550  AST 13* 9* 17  ALT '11 9 15  '$ ALKPHOS 55 47 54  BILITOT 0.9 0.9 0.6  PROT 7.3 6.0* 6.3*  ALBUMIN 3.9 3.1* 3.1*    Recent Results (from the past 240 hour(s))  Urine Culture     Status: Abnormal   Collection Time: 07/11/22  2:46 PM   Specimen: Urine, Clean Catch  Result Value Ref Range Status   Specimen Description   Final  URINE, CLEAN CATCH Performed at [facility], [place] [address], [place], [place] [address]    Special Requests   Final    NONE Performed at [facility], [place] [address], [place], [place] [address]    Culture >=100,000 COLONIES/mL KLEBSIELLA PNEUMONIAE (A)  Final   Report Status  07/13/2022 FINAL  Final   Organism ID, Bacteria KLEBSIELLA PNEUMONIAE (A)  Final      Susceptibility   Klebsiella pneumoniae - MIC*    AMPICILLIN RESISTANT Resistant     CEFAZOLIN <=4 SENSITIVE Sensitive     CEFEPIME <=0.12 SENSITIVE Sensitive     CEFTRIAXONE <=0.25 SENSITIVE Sensitive     CIPROFLOXACIN <=0.25 SENSITIVE Sensitive     GENTAMICIN <=1 SENSITIVE Sensitive     IMIPENEM <=0.25 SENSITIVE Sensitive     NITROFURANTOIN 64 INTERMEDIATE Intermediate     TRIMETH/SULFA <=20 SENSITIVE Sensitive     AMPICILLIN/SULBACTAM 4 SENSITIVE Sensitive     PIP/TAZO <=4 SENSITIVE Sensitive     * >=100,000 COLONIES/mL KLEBSIELLA PNEUMONIAE  C Difficile Quick Screen (NO PCR Reflex)     Status: Abnormal   Collection Time: 07/17/22 12:52 PM   Specimen: STOOL  Result Value Ref Range Status   C Diff antigen POSITIVE (A) NEGATIVE Final   C Diff toxin NEGATIVE NEGATIVE Final   C Diff interpretation   Final    Results are indeterminate. Please contact the provider listed for your campus for C diff questions in [place].    Comment: Performed at [facility], [place] [address], [place], [place] [address]    Antimicrobials: Anti-infectives (From admission, onward)    Start     Dose/Rate Route Frequency Ordered Stop   07/17/22 1530  fidaxomicin (DIFICID) tablet 200 mg        200 mg Oral 2 times daily 07/17/22 1431 07/27/22 0959   07/14/22 1100  ertapenem [facility]) injection 1,000 mg  Status:  Discontinued        1 g Intramuscular Daily 07/14/22 0935 07/14/22 1000   07/14/22 1100  ertapenem (INVANZ) 1,000 mg in sodium chloride 0.9 % 100 mL IVPB        1 g 200 mL/hr over 30 Minutes Intravenous Daily 07/14/22 1002     07/14/22 1030  ciprofloxacin (CIPRO) IVPB 400 mg  Status:  Discontinued        400 mg 200 mL/hr over 60 Minutes Intravenous Every 12 hours 07/14/22 0934 07/14/22 0935   07/14/22 1030  metroNIDAZOLE (FLAGYL) IVPB 500 mg  Status:  Discontinued        500 mg 100 mL/hr over 60  Minutes Intravenous Every 8 hours 07/14/22 0934 07/14/22 0935   07/12/22 1800  cefTRIAXone (ROCEPHIN) 2 g in sodium chloride 0.9 % 100 mL IVPB  Status:  Discontinued        2 g 200 mL/hr over 30 Minutes Intravenous Every 24 hours 07/11/22 1759 07/12/22 0836   07/12/22 0900  piperacillin-tazobactam (ZOSYN) IVPB 3.375 g  Status:  Discontinued        3.375 g 12.5 mL/hr over 240 Minutes Intravenous Every 8 hours 07/12/22 0836 07/14/22 0934   07/11/22 1800  cefTRIAXone (ROCEPHIN) 1 g in sodium chloride 0.9 % 100 mL IVPB        1 g 200 mL/hr over 30 Minutes Intravenous  Once 07/11/22 1759 07/11/22 2300   07/11/22 1730  metroNIDAZOLE (FLAGYL) IVPB 500 mg  Status:  Discontinued        500 mg 100 mL/hr  over 60 Minutes Intravenous Every 12 hours 07/11/22 1726 07/12/22 0836   07/11/22 1545  cefTRIAXone (ROCEPHIN) 1 g in sodium chloride 0.9 % 100 mL IVPB        1 g 200 mL/hr over 30 Minutes Intravenous  Once 07/11/22 1540 07/11/22 1716      Culture/Microbiology    Component Value Date/Time   SDES  07/11/2022 1446    URINE, CLEAN CATCH Performed at [facility], [place] [address], [place], [place] [address]    SPECREQUEST  07/11/2022 1446    NONE Performed at [facility], [place] [address], [place], [place] [address]    CULT >=100,000 COLONIES/mL KLEBSIELLA PNEUMONIAE (A) 07/11/2022 1446   REPTSTATUS 07/13/2022 FINAL 07/11/2022 1446   Radiology Studies: No results found.   LOS: 6 days   Barb Merino, MD [facility]  07/18/2022, 11:32 AM

## 2022-07-18 NOTE — Progress Notes (Signed)
[facility] Progress Note     Subjective: CC-  Overall feels her abd pain is better today compared to yesterday, still having some LLQ pain. Also reports back pain/spasms overnight. Her BMs are slowing down and she reports only 2-3 BMs last 24 hours. No nausea or vomiting. Drank 1-2 ensure yesterday.   Objective: Vital signs in last 24 hours: Temp:  [97.3 F (36.3 C)-98.2 F (36.8 C)] 98.2 F (36.8 C) (09/12 2226) Pulse Rate:  [73-81] 81 (09/12 2226) Resp:  [16-20] 20 (09/12 2226) BP: (125-128)/(72-86) 128/86 (09/12 2226) SpO2:  [95 %-98 %] 95 % (09/12 2226) Last BM Date : 07/17/22  Intake/Output from previous day: 09/12 0701 - 09/13 0700 In: 1788.1 [I.V.:1065.9; IV Piggyback:722.2] Out: -  Intake/Output this shift: No intake/output data recorded.  PE: Gen:  Alert, NAD, pleasant Looks overall less fatigued Cardio: RRR Pulm: CTAB, no wheezing, rate and effort normal  Abd: soft, ND, mildly TTP in LLW without guarding - pain improved compared to previous. No rebound.  Lab Results:  Recent Labs    07/17/22 0550 07/18/22 0655  WBC 6.8 7.3  HGB 10.6* 10.6*  HCT 32.9* 32.9*  PLT 287 287   BMET Recent Labs    07/17/22 0550 07/18/22 0655  NA 142 144  K 3.1* 3.5  CL 112* 109  CO2 22 30  GLUCOSE 121* 107*  BUN 6* 13  CREATININE 0.67 0.75  CALCIUM 8.5* 8.4*   PT/INR No results for input(s): "LABPROT", "INR" in the last 72 hours. CMP     Component Value Date/Time   NA 144 07/18/2022 0655   K 3.5 07/18/2022 0655   CL 109 07/18/2022 0655   CO2 30 07/18/2022 0655   GLUCOSE 107 (H) 07/18/2022 0655   BUN 13 07/18/2022 0655   CREATININE 0.75 07/18/2022 0655   CREATININE 1.00 02/16/2012 1509   CALCIUM 8.4 (L) 07/18/2022 0655   PROT 6.3 (L) 07/17/2022 0550   ALBUMIN 3.1 (L) 07/17/2022 0550   AST 17 07/17/2022 0550   ALT 15 07/17/2022 0550   ALKPHOS 54 07/17/2022 0550   BILITOT 0.6 07/17/2022 0550   GFRNONAA >60 07/18/2022 0655   GFRAA 46 (L)  01/10/2019 2103   Lipase     Component Value Date/Time   LIPASE 23 07/11/2022 1446       Studies/Results: No results found.  Anti-infectives: Anti-infectives (From admission, onward)    Start     Dose/Rate Route Frequency Ordered Stop   07/17/22 1530  fidaxomicin (DIFICID) tablet 200 mg        200 mg Oral 2 times daily 07/17/22 1431 07/27/22 0959   07/14/22 1100  ertapenem [facility]) injection 1,000 mg  Status:  Discontinued        1 g Intramuscular Daily 07/14/22 0935 07/14/22 1000   07/14/22 1100  ertapenem (INVANZ) 1,000 mg in sodium chloride 0.9 % 100 mL IVPB        1 g 200 mL/hr over 30 Minutes Intravenous Daily 07/14/22 1002     07/14/22 1030  ciprofloxacin (CIPRO) IVPB 400 mg  Status:  Discontinued        400 mg 200 mL/hr over 60 Minutes Intravenous Every 12 hours 07/14/22 0934 07/14/22 0935   07/14/22 1030  metroNIDAZOLE (FLAGYL) IVPB 500 mg  Status:  Discontinued        500 mg 100 mL/hr over 60 Minutes Intravenous Every 8 hours 07/14/22 0934 07/14/22 0935   07/12/22 1800  cefTRIAXone (ROCEPHIN) 2 g in sodium chloride  0.9 % 100 mL IVPB  Status:  Discontinued        2 g 200 mL/hr over 30 Minutes Intravenous Every 24 hours 07/11/22 1759 07/12/22 0836   07/12/22 0900  piperacillin-tazobactam (ZOSYN) IVPB 3.375 g  Status:  Discontinued        3.375 g 12.5 mL/hr over 240 Minutes Intravenous Every 8 hours 07/12/22 0836 07/14/22 0934   07/11/22 1800  cefTRIAXone (ROCEPHIN) 1 g in sodium chloride 0.9 % 100 mL IVPB        1 g 200 mL/hr over 30 Minutes Intravenous  Once 07/11/22 1759 07/11/22 2300   07/11/22 1730  metroNIDAZOLE (FLAGYL) IVPB 500 mg  Status:  Discontinued        500 mg 100 mL/hr over 60 Minutes Intravenous Every 12 hours 07/11/22 1726 07/12/22 0836   07/11/22 1545  cefTRIAXone (ROCEPHIN) 1 g in sodium chloride 0.9 % 100 mL IVPB        1 g 200 mL/hr over 30 Minutes Intravenous  Once 07/11/22 1540 07/11/22 1716        Assessment/Plan Diverticulitis with  small abscess - afebrile, normal WBC, on steroids/MTX - 1st bout, last colonoscopy 2013 - CT scan 9/6 shows Acute sigmoid colon diverticulitis with small peridiverticular abscess/fluid collection, 1.9 cm in greatest dimension - CT 9/10 w/ similar amount of inflammatory changes around sigmoid colon and slight increase in fluid collection - 2.6cm  - no emergent surgical needs. Patients CT scan is stable and clinically she is now slowly improving. Hopefully she will show improvement on c.dif tx.  Failure to improve may warrant laparotomy, partial colectomy, end colostomy. She would be at increased risk for issues with wound healing and post-operative complications given her use of immunosuppressive therapies/steroids.   ID - rocephin/flagyl 9/6>>9/7, zosyn 9/7-9/9, ertapenem 9/9 >>; c.dif antigen positive 9/12, reviewed with ID and started fidaxomicin (10 days) FEN - FLD, ensure, PICC/TPN - continue full support today, ecouraged patient to take in more PO since she is feeling a little better. If she continued to improve will start to wean TPN in 24-48 hours VTE - SCDs, lovenox Foley - none     Klebsiella pneumoniae UTI  PMR on simponi/prednisone/MTX HTN HLD CAD Chronic back pain   I reviewed last 24 h vitals and pain scores, last 48 h intake and output, last 24 h labs and trends. Discussed with hospitalist.      LOS: 6 days   Obie Dredge, [facility] Surgery Please see Amion for pager number during day hours 7:00am-4:30pm

## 2022-07-18 NOTE — Progress Notes (Signed)
Mobility Specialist - Progress Note   07/18/22 1146  Mobility  HOB Elevated/Bed Position Self regulated  Activity Ambulated with assistance in hallway  Range of Motion/Exercises Active  Level of Assistance Modified independent, requires aide device or extra time  Assistive Device  (IV Pole)  Distance Ambulated (ft) 125 ft  Activity Response Tolerated well  Transport method Ambulatory  $Mobility charge 1 Mobility   Pt received in bed and agreeable to mobility. Pt to bed after session with all needs met.    [facility]

## 2022-07-18 NOTE — TOC Benefit Eligibility Note (Signed)
Patient Teacher, English as a foreign language completed.    The patient is currently admitted and upon discharge could be taking Dificid 200 mg tablets.  The current 10 day co-pay is $1,378.38.   The patient is currently admitted and upon discharge could be taking Vancomycin 125 mg capsules.  The current 10 day co-pay is $54.55.   The patient is insured through [employer] Patient Advocate Specialist [place] Patient Advocate Team Direct Number: [phone]  Fax: [phone]

## 2022-07-19 DIAGNOSIS — K5720 Diverticulitis of large intestine with perforation and abscess without bleeding: Secondary | ICD-10-CM | POA: Diagnosis not present

## 2022-07-19 LAB — COMPREHENSIVE METABOLIC PANEL
ALT: 17 U/L (ref 0–44)
AST: 21 U/L (ref 15–41)
Albumin: 3.3 g/dL — ABNORMAL LOW (ref 3.5–5.0)
Alkaline Phosphatase: 53 U/L (ref 38–126)
Anion gap: 4 — ABNORMAL LOW (ref 5–15)
BUN: 17 mg/dL (ref 8–23)
CO2: 31 mmol/L (ref 22–32)
Calcium: 8.9 mg/dL (ref 8.9–10.3)
Chloride: 106 mmol/L (ref 98–111)
Creatinine, Ser: 0.69 mg/dL (ref 0.44–1.00)
GFR, Estimated: >60 mL/min (ref >60)
Glucose, Bld: 92 mg/dL (ref 70–99)
Potassium: 4 mmol/L (ref 3.5–5.1)
Sodium: 141 mmol/L (ref 135–145)
Total Bilirubin: 0.4 mg/dL (ref 0.3–1.2)
Total Protein: 6.3 g/dL — ABNORMAL LOW (ref 6.5–8.1)

## 2022-07-19 LAB — GLUCOSE, CAPILLARY
Glucose-Capillary: 104 mg/dL — ABNORMAL HIGH (ref 70–99)
Glucose-Capillary: 107 mg/dL — ABNORMAL HIGH (ref 70–99)
Glucose-Capillary: 114 mg/dL — ABNORMAL HIGH (ref 70–99)
Glucose-Capillary: 130 mg/dL — ABNORMAL HIGH (ref 70–99)

## 2022-07-19 LAB — MAGNESIUM: Magnesium: 2.2 mg/dL (ref 1.7–2.4)

## 2022-07-19 LAB — TRIGLYCERIDES: Triglycerides: 91 mg/dL (ref <150)

## 2022-07-19 LAB — PHOSPHORUS: Phosphorus: 3.6 mg/dL (ref 2.5–4.6)

## 2022-07-19 MED ORDER — INSULIN ASPART 100 UNIT/ML IJ SOLN
0.0000 [IU] | Freq: Three times a day (TID) | INTRAMUSCULAR | Status: DC
  Administered 2022-07-19: 2 [IU] via SUBCUTANEOUS

## 2022-07-19 MED ORDER — TRAVASOL 10 % IV SOLN
INTRAVENOUS | Status: DC
  Filled 2022-07-19: qty 432

## 2022-07-19 NOTE — Progress Notes (Signed)
PHARMACY - TOTAL PARENTERAL NUTRITION CONSULT NOTE   Indication: sigmoid colon diverticulitis with abscess resulting in intolerance to enteral feeding  Patient Measurements: Height: '5\' 3"'  (160 cm) Weight: 72.3 kg (159 lb 5.2 oz) IBW/kg (Calculated) : 52.4 Body mass index is 28.22 kg/m. Usual Weight: 170 lbs  Assessment:  Pt is a 30 yoF with a PMH significant for polymyalgia rheumatica, carotid artery stenosis, hepatic hemangioma, aortic atherosclerosis, HTN, and chronic back pain who presented to the ED 9/6 with LLQ abdominal pain, suprapubic pain, nausea, and chills x 3 days. CT abdomen pelvis revealed acute sigmoid colon diverticulitis and abscess. TPN indicated for prolonged inability to tolerate enteral feeds and associated malnutrition. TPN initiated on day 5 of admission after failing to improve on antibiotics alone.  Today, 07/19/22: - Pt tolerated 2 Ensures and a pudding cup yesterday - Pt continues to have abdominal pain and loose stools - Day 2 fidaxomicin for C. diff  Glucose / Insulin: no hx DM; CBGs 104-134, 2 units insulin aspart given Electrolytes: all lytes now WNL Renal: SCr stable at 0.69; BUN WNL Hepatic: AST, ALT, Tbili, alk phos WNL Intake / Output: net +1,788 mL; charting not accurate MIVF: NS @ 10 mL/hr GI Imaging: - 9/6 CT showed acute sigmoid colon diverticulitis with small peridiverticular abscess/fluid collection - 1.9 cm - 9/10 CT showed similar amount of inflammatory changes around sigmoid colon and slight increase in fluid collection - 2.6 cm  GI Surgeries / Procedures: n/a  Central access: double lumen PICC TPN start date: 07/16/22  Nutritional Goals: Goal TPN rate is 80 mL/hr (provides 86.4 g of protein and 1900 kcals per day)  RD Assessment: Estimated Needs Total Energy Estimated Needs: 1800-2000 Total Protein Estimated Needs: 80-90g Total Fluid Estimated Needs: 2L/day  Current Nutrition:  Soft diet  Plan:  - Reduce TPN rate to 56m/hr at  1800 (provides 43.2 g protein and 950 kcals per day) given advancing PO intake - Electrolytes in TPN: Na 521m/L, K 5028mL, Ca 5mE41m, Mg 5mEq80m and Phos 10 mmol/L. Cl:Ac 1:1 - Add standard MVI and trace elements to TPN - Reduce Sensitive SSI to q8h checks and adjust as needed  - Continue NS at 10 mL/hr - Repeat BMP, Mg, Phos 9/16 if TPN still running  HannaCannon KettlermD Candidate 07/19/2022 1:26 PM

## 2022-07-19 NOTE — Progress Notes (Signed)
[facility] Progress Note     Subjective: CC-  Reports she had a rough night due to pain - in her back and her lower extremities related to her history of polymyalgia. Also reports ongoing abd pain. Reports many watery, non-bloody BMs overnight. Tolerated 2 ensure and a pudding yesterday.   Objective: Vital signs in last 24 hours: Temp:  [98 F (36.7 C)-98.8 F (37.1 C)] 98.8 F (37.1 C) (09/14 0535) Pulse Rate:  [72-78] 72 (09/14 0535) Resp:  [16-18] 18 (09/14 0535) BP: (83-139)/(63-91) 111/74 (09/14 0535) SpO2:  [95 %-98 %] 95 % (09/14 0535) Last BM Date : 07/18/22  Intake/Output from previous day: 09/13 0701 - 09/14 0700 In: 1566.4 [I.V.:1466.4; IV Piggyback:100] Out: -  Intake/Output this shift: No intake/output data recorded.  PE: Gen:  Alert, NAD, pleasant Looks overall less fatigued Cardio: RRR Pulm: CTAB, no wheezing, rate and effort normal  Abd: soft, ND, mild mostly subjective TTP in LLQ without guarding No rebound.  Lab Results:  Recent Labs    07/17/22 0550 07/18/22 0655  WBC 6.8 7.3  HGB 10.6* 10.6*  HCT 32.9* 32.9*  PLT 287 287   BMET Recent Labs    07/18/22 0655 07/19/22 0600  NA 144 141  K 3.5 4.0  CL 109 106  CO2 30 31  GLUCOSE 107* 92  BUN 13 17  CREATININE 0.75 0.69  CALCIUM 8.4* 8.9   PT/INR No results for input(s): "LABPROT", "INR" in the last 72 hours. CMP     Component Value Date/Time   NA 141 07/19/2022 0600   K 4.0 07/19/2022 0600   CL 106 07/19/2022 0600   CO2 31 07/19/2022 0600   GLUCOSE 92 07/19/2022 0600   BUN 17 07/19/2022 0600   CREATININE 0.69 07/19/2022 0600   CREATININE 1.00 02/16/2012 1509   CALCIUM 8.9 07/19/2022 0600   PROT 6.3 (L) 07/19/2022 0600   ALBUMIN 3.3 (L) 07/19/2022 0600   AST 21 07/19/2022 0600   ALT 17 07/19/2022 0600   ALKPHOS 53 07/19/2022 0600   BILITOT 0.4 07/19/2022 0600   GFRNONAA >60 07/19/2022 0600   GFRAA 46 (L) 01/10/2019 2103   Lipase     Component Value  Date/Time   LIPASE 23 07/11/2022 1446       Studies/Results: No results found.  Anti-infectives: Anti-infectives (From admission, onward)    Start     Dose/Rate Route Frequency Ordered Stop   07/17/22 1530  fidaxomicin (DIFICID) tablet 200 mg        200 mg Oral 2 times daily 07/17/22 1431 07/27/22 0959   07/14/22 1100  ertapenem [facility]) injection 1,000 mg  Status:  Discontinued        1 g Intramuscular Daily 07/14/22 0935 07/14/22 1000   07/14/22 1100  ertapenem (INVANZ) 1,000 mg in sodium chloride 0.9 % 100 mL IVPB        1 g 200 mL/hr over 30 Minutes Intravenous Daily 07/14/22 1002     07/14/22 1030  ciprofloxacin (CIPRO) IVPB 400 mg  Status:  Discontinued        400 mg 200 mL/hr over 60 Minutes Intravenous Every 12 hours 07/14/22 0934 07/14/22 0935   07/14/22 1030  metroNIDAZOLE (FLAGYL) IVPB 500 mg  Status:  Discontinued        500 mg 100 mL/hr over 60 Minutes Intravenous Every 8 hours 07/14/22 0934 07/14/22 0935   07/12/22 1800  cefTRIAXone (ROCEPHIN) 2 g in sodium chloride 0.9 % 100 mL IVPB  Status:  Discontinued        2 g 200 mL/hr over 30 Minutes Intravenous Every 24 hours 07/11/22 1759 07/12/22 0836   07/12/22 0900  piperacillin-tazobactam (ZOSYN) IVPB 3.375 g  Status:  Discontinued        3.375 g 12.5 mL/hr over 240 Minutes Intravenous Every 8 hours 07/12/22 0836 07/14/22 0934   07/11/22 1800  cefTRIAXone (ROCEPHIN) 1 g in sodium chloride 0.9 % 100 mL IVPB        1 g 200 mL/hr over 30 Minutes Intravenous  Once 07/11/22 1759 07/11/22 2300   07/11/22 1730  metroNIDAZOLE (FLAGYL) IVPB 500 mg  Status:  Discontinued        500 mg 100 mL/hr over 60 Minutes Intravenous Every 12 hours 07/11/22 1726 07/12/22 0836   07/11/22 1545  cefTRIAXone (ROCEPHIN) 1 g in sodium chloride 0.9 % 100 mL IVPB        1 g 200 mL/hr over 30 Minutes Intravenous  Once 07/11/22 1540 07/11/22 1716        Assessment/Plan Diverticulitis with small abscess - afebrile, normal WBC, on  steroids/MTX - 1st bout, last colonoscopy 2013 - CT scan 9/6 shows Acute sigmoid colon diverticulitis with small peridiverticular abscess/fluid collection, 1.9 cm in greatest dimension - CT 9/10 w/ similar amount of inflammatory changes around sigmoid colon and slight increase in fluid collection - 2.6cm  - no emergent surgical needs. Patients CT scan is stable and clinically she is now slowly improving. Her chronic pain/myalgias make it somewhat difficult for her to rate/report her abdominal sxs. She expresses extreme discomfort, not necessarily improving, but her abdominal exam remains benign. Advance diet and decrease TPN today.  Failure to improve may warrant laparotomy, partial colectomy, end colostomy. She would be at increased risk for issues with wound healing and post-operative complications given her use of immunosuppressive therapies/steroids.   ID - rocephin/flagyl 9/6>>9/7, zosyn 9/7-9/9, ertapenem 9/9 >>; c.dif antigen positive 9/12, reviewed with ID and started fidaxomicin (10 days) FEN - SOFT, ensure, PICC/TPN - decrease support today VTE - SCDs, lovenox Foley - none     Klebsiella pneumoniae UTI  PMR on simponi/prednisone/MTX HTN HLD CAD Chronic back pain   I reviewed last 24 h vitals and pain scores, last 48 h intake and output, last 24 h labs and trends. Discussed with hospitalist.      LOS: 7 days   Obie Dredge, [facility] Surgery Please see Amion for pager number during day hours 7:00am-4:30pm

## 2022-07-19 NOTE — Progress Notes (Signed)
Jenny Glass  XTG:[id number] DOB: Aug 19, 1951 DOA: 07/11/2022 PCP: Merrilee Seashore, MD   Brief Narrative/Hospital Course: 71 year old female with history of polymyalgia rheumatica, carotid artery stenosis left greater than right, hepatic hemangioma, hyperlipidemia, aortic atherosclerosis, hypertension, chronic back pain presented to the ED with abdominal pain left lower quadrant and suprapubic with nausea x3 days with associated chills, possible fever at home but no vomiting or diarrhea.  Seen at PCP office and instructed to come to the ED. In the ED vitals stable, underwent lab work that showed mild leukocytosis abnormal UA, CT abdomen pelvis with acute sigmoid colon diverticulitis with small peridiverticular abscess/fluid collection 1.9 cm.  Patient was given LR 1 L, Rocephin 1 g, Flagyl, lactic acid ordered and admission requested.  Seen by surgery, antibiotic changed to Zosyn for complicated diverticulitis-being managed conservatively.  Initial Flagyl Rocephin/and switched to Zosyn > then to [facility] 07/14/22.  Remains in the hospital due to persistent abdominal pain, unable to tolerate oral intake, TPN.   Subjective:  Complains of ongoing abdominal pain, loose stool, she did try some Ensure and had diarrhea after each intake.  She is scared to eat.  Very anxious.   Assessment and Plan: Principal Problem:   Abscess of sigmoid colon due to diverticulitis Active Problems:   Hyperlipidemia   Chronic back pain   Hypertension   Carotid artery stenosis   UTI (urinary tract infection)   Diverticulitis of intestine with abscess   Malnutrition of moderate degree  Acute complicated sigmoid diverticulitis with a small abscess 1.9 cm in CT> increased to 2.6. Attempting conservative management to avoid surgery and colostomy.   Full liquid diet Adequate pain medications TPN to continue until she tolerates full liquid diet On different antibiotics currently on Invanz will continue  until clinical improvement. Loose stool prompted C. difficile testing, antigen positive for C. difficile but toxin is negative. Surgery recommended treatment with Dificid due to symptomatic disease though toxin is negative. Mobilize. Surgery closely following for possible need for laparotomy.  Moderate malnutrition:In the setting of decreased oral intake due to #1, dietitian following, continue TPN   Wheezing/shortness of breath  cxr 9/8- any acute finding, on gentle IV fluid rate , encourage incentive spirometry, bronchodilators.  Klebsiella pneumoniae UTI : Has gotten adequate coverage   PMR on simponi inj q6-8 wk,methotrexate inj q wkly, and prednisone. Patient is immunocompromised status Patient is due for injection Simponi agents but will hold off due to patient's acute infection and will need to be reassessed by her rheumatologist. Cont her prednisone  Headache:continue Tylenol prn  Hyperlipidemia Hypertension Carotid artery stenosis: She has no chest pain. BP stable.Cont to hold amlodipine and losartan. Cont crestor 10 mg,   Chronic back pain: cont pain control.   DVT prophylaxis: enoxaparin (LOVENOX) injection 40 mg Start: 07/11/22 2200 Code Status:   Code Status: Full Code Family Communication: None at the bedside. Patient status is: Remains hospitalized for continued IV antibiotics for complicated diverticulitis, TPN Level of care: Med-Surg  Dispo: The patient is from: home            Anticipated disposition: home  Mobility Assessment (last 72 hours)     Mobility Assessment     Row Name 07/18/22 2035 07/18/22 2030 07/18/22 0940 07/17/22 2229 07/17/22 1953   Does patient have an order for bedrest or is patient medically unstable No - Continue assessment No - Continue assessment No - Continue assessment No - Continue assessment No - Continue assessment   What is the  highest level of mobility based on the progressive mobility assessment? Level 6 (Walks independently in  room and hall) - Balance while walking in room without assist - Complete Level 6 (Walks independently in room and hall) - Balance while walking in room without assist - Complete Level 6 (Walks independently in room and hall) - Balance while walking in room without assist - Complete Level 6 (Walks independently in room and hall) - Balance while walking in room without assist - Complete Level 6 (Walks independently in room and hall) - Balance while walking in room without assist - Complete    Row Name 07/17/22 0752 07/16/22 2100         Does patient have an order for bedrest or is patient medically unstable No - Continue assessment No - Continue assessment      What is the highest level of mobility based on the progressive mobility assessment? Level 6 (Walks independently in room and hall) - Balance while walking in room without assist - Complete Level 6 (Walks independently in room and hall) - Balance while walking in room without assist - Complete             Objective: Vitals last 24 hrs: Vitals:   07/18/22 1719 07/18/22 1735 07/18/22 2150 07/19/22 0535  BP: (!) 139/91  135/73 111/74  Pulse: 72  73 72  Resp:   18 18  Temp:   98.4 F (36.9 C) 98.8 F (37.1 C)  TempSrc:   Oral Oral  SpO2:   95% 95%  Weight:      Height:  '5\' 3"'$  (1.6 m)     Weight change:   Physical Examination:  General: Anxious.  Mild distress due to pain and anxiety but not in any discomfort at the time of interview. Cardiovascular: S1-S2 normal.  Regular rate rhythm. Respiratory: Bilateral clear.  No added sounds. Gastrointestinal: Mild diffuse tenderness all over the abdomen.  No rigidity.  No guarding. Ext: No swelling or edema.  No cyanosis. Neuro: Alert oriented.  Anxious.  No focal deficits. Musculoskeletal: No deformities. Skin: Intact.    Medications reviewed:  Scheduled Meds:  albuterol  2.5 mg Nebulization Once   calcium carbonate  1 tablet Oral Daily   Chlorhexidine Gluconate Cloth  6 each  Topical Daily   enoxaparin (LOVENOX) injection  40 mg Subcutaneous Q24H   feeding supplement  237 mL Oral TID WC   fidaxomicin  200 mg Oral BID   folic acid  998 mcg Oral Daily   guaiFENesin  600 mg Oral BID   insulin aspart  0-15 Units Subcutaneous Q6H   predniSONE  2 mg Oral Q breakfast   rosuvastatin  10 mg Oral Daily   sodium chloride flush  10-40 mL Intracatheter Q12H   thiamine (VITAMIN B1) injection  100 mg Intravenous Daily  Continuous Infusions:  sodium chloride Stopped (07/17/22 0844)   ertapenem 1,000 mg (07/19/22 1035)   TPN ADULT (ION) 60 mL/hr at 07/18/22 1734    Diet Order             DIET SOFT Room service appropriate? Yes; Fluid consistency: Thin  Diet effective now                  Intake/Output Summary (Last 24 hours) at 07/19/2022 1052 Last data filed at 07/18/2022 1839 Gross per 24 hour  Intake 1566.42 ml  Output --  Net 1566.42 ml    Net IO Since Admission: 5,692.12 mL [07/19/22 1052]  Wt Readings  from Last 3 Encounters:  07/16/22 72.3 kg  06/20/22 74.4 kg  05/01/22 75.4 kg     Unresulted Labs (From admission, onward)     Start     Ordered   07/19/22 0500  Comprehensive metabolic panel  (TPN Lab Panel)  Every Mon,Thu (0500),   R     Question:  Specimen collection method  Answer:  Lab=Lab collect   07/16/22 1104   07/19/22 0500  Magnesium  (TPN Lab Panel)  Every Mon,Thu (0500),   R     Question:  Specimen collection method  Answer:  Lab=Lab collect   07/16/22 1104   07/19/22 0500  Phosphorus  (TPN Lab Panel)  Every Mon,Thu (0500),   R     Question:  Specimen collection method  Answer:  Lab=Lab collect   07/16/22 1104   07/19/22 0500  Triglycerides  (TPN Lab Panel)  Every Mon,Thu (0500),   R     Question:  Specimen collection method  Answer:  Lab=Lab collect   07/16/22 1104   07/18/22 0500  Creatinine, serum  (enoxaparin (LOVENOX)    CrCl >/= 30 ml/min)  Weekly,   R     Comments: while on enoxaparin therapy    07/11/22 1740           Data Reviewed: I have personally reviewed following labs and imaging studies CBC: Recent Labs  Lab 07/14/22 0751 07/15/22 0605 07/16/22 0541 07/17/22 0550 07/18/22 0655  WBC 8.5 6.4 8.1 6.8 7.3  HGB 10.4* 10.1* 10.5* 10.6* 10.6*  HCT 33.1* 32.6* 32.9* 32.9* 32.9*  MCV 95.1 96.4 93.7 91.9 92.4  PLT 192 216 268 287 831    Basic Metabolic Panel: Recent Labs  Lab 07/15/22 0605 07/16/22 0541 07/17/22 0550 07/18/22 0655 07/18/22 0940 07/19/22 0600  NA 142 143 142 144  --  141  K 3.7 3.7 3.1* 3.5  --  4.0  CL 111 115* 112* 109  --  106  CO2 23 18* 22 30  --  31  GLUCOSE 71 70 121* 107*  --  92  BUN 7* 6* 6* 13  --  17  CREATININE 0.82 0.93 0.67 0.75  --  0.69  CALCIUM 8.2* 8.5* 8.5* 8.4*  --  8.9  MG  --   --  2.0  --  2.1 2.2  PHOS  --   --  2.3*  --  4.8* 3.6    GFR: Estimated Creatinine Clearance: 63.3 mL/min (by C-G formula based on SCr of 0.69 mg/dL). Liver Function Tests: Recent Labs  Lab 07/17/22 0550 07/19/22 0600  AST 17 21  ALT 15 17  ALKPHOS 54 53  BILITOT 0.6 0.4  PROT 6.3* 6.3*  ALBUMIN 3.1* 3.3*     Recent Results (from the past 240 hour(s))  Urine Culture     Status: Abnormal   Collection Time: 07/11/22  2:46 PM   Specimen: Urine, Clean Catch  Result Value Ref Range Status   Specimen Description   Final    URINE, CLEAN CATCH Performed at [facility], [place] [address], [place], [place] [address]    Special Requests   Final    NONE Performed at [facility], [place] [address] [place], [place] [address]    Culture >=100,000 COLONIES/mL KLEBSIELLA PNEUMONIAE (A)  Final   Report Status 07/13/2022 FINAL  Final   Organism ID, Bacteria KLEBSIELLA PNEUMONIAE (A)  Final      Susceptibility   Klebsiella pneumoniae - MIC*  AMPICILLIN RESISTANT Resistant     CEFAZOLIN <=4 SENSITIVE Sensitive     CEFEPIME <=0.12 SENSITIVE Sensitive     CEFTRIAXONE <=0.25 SENSITIVE Sensitive     CIPROFLOXACIN <=0.25 SENSITIVE  Sensitive     GENTAMICIN <=1 SENSITIVE Sensitive     IMIPENEM <=0.25 SENSITIVE Sensitive     NITROFURANTOIN 64 INTERMEDIATE Intermediate     TRIMETH/SULFA <=20 SENSITIVE Sensitive     AMPICILLIN/SULBACTAM 4 SENSITIVE Sensitive     PIP/TAZO <=4 SENSITIVE Sensitive     * >=100,000 COLONIES/mL KLEBSIELLA PNEUMONIAE  C Difficile Quick Screen (NO PCR Reflex)     Status: Abnormal   Collection Time: 07/17/22 12:52 PM   Specimen: STOOL  Result Value Ref Range Status   C Diff antigen POSITIVE (A) NEGATIVE Final   C Diff toxin NEGATIVE NEGATIVE Final   C Diff interpretation   Final    Results are indeterminate. Please contact the provider listed for your campus for C diff questions in [place].    Comment: Performed at [facility], [place] [address], [place], [place] [address]    Antimicrobials: Anti-infectives (From admission, onward)    Start     Dose/Rate Route Frequency Ordered Stop   07/17/22 1530  fidaxomicin (DIFICID) tablet 200 mg        200 mg Oral 2 times daily 07/17/22 1431 07/27/22 0959   07/14/22 1100  ertapenem [facility]) injection 1,000 mg  Status:  Discontinued        1 g Intramuscular Daily 07/14/22 0935 07/14/22 1000   07/14/22 1100  ertapenem (INVANZ) 1,000 mg in sodium chloride 0.9 % 100 mL IVPB        1 g 200 mL/hr over 30 Minutes Intravenous Daily 07/14/22 1002     07/14/22 1030  ciprofloxacin (CIPRO) IVPB 400 mg  Status:  Discontinued        400 mg 200 mL/hr over 60 Minutes Intravenous Every 12 hours 07/14/22 0934 07/14/22 0935   07/14/22 1030  metroNIDAZOLE (FLAGYL) IVPB 500 mg  Status:  Discontinued        500 mg 100 mL/hr over 60 Minutes Intravenous Every 8 hours 07/14/22 0934 07/14/22 0935   07/12/22 1800  cefTRIAXone (ROCEPHIN) 2 g in sodium chloride 0.9 % 100 mL IVPB  Status:  Discontinued        2 g 200 mL/hr over 30 Minutes Intravenous Every 24 hours 07/11/22 1759 07/12/22 0836   07/12/22 0900  piperacillin-tazobactam (ZOSYN) IVPB 3.375 g   Status:  Discontinued        3.375 g 12.5 mL/hr over 240 Minutes Intravenous Every 8 hours 07/12/22 0836 07/14/22 0934   07/11/22 1800  cefTRIAXone (ROCEPHIN) 1 g in sodium chloride 0.9 % 100 mL IVPB        1 g 200 mL/hr over 30 Minutes Intravenous  Once 07/11/22 1759 07/11/22 2300   07/11/22 1730  metroNIDAZOLE (FLAGYL) IVPB 500 mg  Status:  Discontinued        500 mg 100 mL/hr over 60 Minutes Intravenous Every 12 hours 07/11/22 1726 07/12/22 0836   07/11/22 1545  cefTRIAXone (ROCEPHIN) 1 g in sodium chloride 0.9 % 100 mL IVPB        1 g 200 mL/hr over 30 Minutes Intravenous  Once 07/11/22 1540 07/11/22 1716      Culture/Microbiology    Component Value Date/Time   SDES  07/11/2022 1446    URINE, CLEAN CATCH Performed at [facility], [place] [address] [place], [place]  [place]  07/11/2022 1446    NONE Performed at [facility], [place] [address], [place], [place] [address]    CULT >=100,000 COLONIES/mL KLEBSIELLA PNEUMONIAE (A) 07/11/2022 1446   REPTSTATUS 07/13/2022 FINAL 07/11/2022 1446   Radiology Studies: No results found.   LOS: 7 days   Barb Merino, MD [facility]  07/19/2022, 10:52 AM

## 2022-07-20 ENCOUNTER — Telehealth (HOSPITAL_COMMUNITY): Payer: Self-pay

## 2022-07-20 ENCOUNTER — Other Ambulatory Visit (HOSPITAL_COMMUNITY): Payer: Self-pay

## 2022-07-20 DIAGNOSIS — K5720 Diverticulitis of large intestine with perforation and abscess without bleeding: Secondary | ICD-10-CM | POA: Diagnosis not present

## 2022-07-20 LAB — GLUCOSE, CAPILLARY
Glucose-Capillary: 97 mg/dL (ref 70–99)
Glucose-Capillary: 98 mg/dL (ref 70–99)

## 2022-07-20 MED ORDER — BANATROL TF EN LIQD
60.0000 mL | Freq: Two times a day (BID) | ENTERAL | Status: DC
  Administered 2022-07-20: 60 mL
  Filled 2022-07-20: qty 60

## 2022-07-20 MED ORDER — ENOXAPARIN SODIUM 40 MG/0.4ML IJ SOSY
40.0000 mg | PREFILLED_SYRINGE | INTRAMUSCULAR | Status: DC
  Administered 2022-07-20 – 2022-07-26 (×7): 40 mg via SUBCUTANEOUS
  Filled 2022-07-20 (×7): qty 0.4

## 2022-07-20 MED ORDER — BANATROL TF EN LIQD
60.0000 mL | Freq: Two times a day (BID) | ENTERAL | Status: DC
  Administered 2022-07-20 – 2022-07-27 (×14): 60 mL via ORAL
  Filled 2022-07-20 (×14): qty 60

## 2022-07-20 NOTE — Progress Notes (Signed)
[facility] Progress Note     Subjective: CC-  NAEO. Ongoing BMs with some incontinence. Ice has helped her back pain. Still endorses LLQ pain. No N/V. Drank 2-3 ensure yesterday. Eating a little breakfast now - pancakes   Objective: Vital signs in last 24 hours: Temp:  [97.6 F (36.4 C)-98 F (36.7 C)] 97.6 F (36.4 C) (09/15 0625) Pulse Rate:  [69-74] 69 (09/15 0625) Resp:  [15-18] 18 (09/15 0625) BP: (101-112)/(63-70) 112/68 (09/15 0625) SpO2:  [93 %-94 %] 93 % (09/15 0625) Last BM Date : 07/19/22  Intake/Output from previous day: 09/14 0701 - 09/15 0700 In: 1657.7 [P.O.:235; I.V.:1312.6; IV Piggyback:110.1] Out: -  Intake/Output this shift: No intake/output data recorded.  PE: Gen:  Alert, NAD, pleasant Looks overall less fatigued Cardio: RRR Pulm: CTAB, no wheezing, rate and effort normal  Abd: soft, ND, mild mostly subjective TTP in LLQ without guarding No rebound.  Lab Results:  Recent Labs    07/18/22 0655  WBC 7.3  HGB 10.6*  HCT 32.9*  PLT 287   BMET Recent Labs    07/18/22 0655 07/19/22 0600  NA 144 141  K 3.5 4.0  CL 109 106  CO2 30 31  GLUCOSE 107* 92  BUN 13 17  CREATININE 0.75 0.69  CALCIUM 8.4* 8.9   PT/INR No results for input(s): "LABPROT", "INR" in the last 72 hours. CMP     Component Value Date/Time   NA 141 07/19/2022 0600   K 4.0 07/19/2022 0600   CL 106 07/19/2022 0600   CO2 31 07/19/2022 0600   GLUCOSE 92 07/19/2022 0600   BUN 17 07/19/2022 0600   CREATININE 0.69 07/19/2022 0600   CREATININE 1.00 02/16/2012 1509   CALCIUM 8.9 07/19/2022 0600   PROT 6.3 (L) 07/19/2022 0600   ALBUMIN 3.3 (L) 07/19/2022 0600   AST 21 07/19/2022 0600   ALT 17 07/19/2022 0600   ALKPHOS 53 07/19/2022 0600   BILITOT 0.4 07/19/2022 0600   GFRNONAA >60 07/19/2022 0600   GFRAA 46 (L) 01/10/2019 2103   Lipase     Component Value Date/Time   LIPASE 23 07/11/2022 1446       Studies/Results: No results  found.  Anti-infectives: Anti-infectives (From admission, onward)    Start     Dose/Rate Route Frequency Ordered Stop   07/17/22 1530  fidaxomicin (DIFICID) tablet 200 mg        200 mg Oral 2 times daily 07/17/22 1431 07/27/22 0959   07/14/22 1100  ertapenem [facility]) injection 1,000 mg  Status:  Discontinued        1 g Intramuscular Daily 07/14/22 0935 07/14/22 1000   07/14/22 1100  ertapenem (INVANZ) 1,000 mg in sodium chloride 0.9 % 100 mL IVPB        1 g 200 mL/hr over 30 Minutes Intravenous Daily 07/14/22 1002     07/14/22 1030  ciprofloxacin (CIPRO) IVPB 400 mg  Status:  Discontinued        400 mg 200 mL/hr over 60 Minutes Intravenous Every 12 hours 07/14/22 0934 07/14/22 0935   07/14/22 1030  metroNIDAZOLE (FLAGYL) IVPB 500 mg  Status:  Discontinued        500 mg 100 mL/hr over 60 Minutes Intravenous Every 8 hours 07/14/22 0934 07/14/22 0935   07/12/22 1800  cefTRIAXone (ROCEPHIN) 2 g in sodium chloride 0.9 % 100 mL IVPB  Status:  Discontinued        2 g 200 mL/hr over 30 Minutes Intravenous Every  24 hours 07/11/22 1759 07/12/22 0836   07/12/22 0900  piperacillin-tazobactam (ZOSYN) IVPB 3.375 g  Status:  Discontinued        3.375 g 12.5 mL/hr over 240 Minutes Intravenous Every 8 hours 07/12/22 0836 07/14/22 0934   07/11/22 1800  cefTRIAXone (ROCEPHIN) 1 g in sodium chloride 0.9 % 100 mL IVPB        1 g 200 mL/hr over 30 Minutes Intravenous  Once 07/11/22 1759 07/11/22 2300   07/11/22 1730  metroNIDAZOLE (FLAGYL) IVPB 500 mg  Status:  Discontinued        500 mg 100 mL/hr over 60 Minutes Intravenous Every 12 hours 07/11/22 1726 07/12/22 0836   07/11/22 1545  cefTRIAXone (ROCEPHIN) 1 g in sodium chloride 0.9 % 100 mL IVPB        1 g 200 mL/hr over 30 Minutes Intravenous  Once 07/11/22 1540 07/11/22 1716        Assessment/Plan Diverticulitis with small abscess - afebrile, normal WBC, on steroids/MTX - 1st bout, last colonoscopy 2013 - CT scan 9/6 shows Acute sigmoid colon  diverticulitis with small peridiverticular abscess/fluid collection, 1.9 cm in greatest dimension - CT 9/10 w/ similar amount of inflammatory changes around sigmoid colon and slight increase in fluid collection - 2.6cm  - no emergent surgical needs. Patients CT scan is stable and clinically she is now slowly improving. Her chronic pain/myalgias make it somewhat difficult for her to rate/report her abdominal sxs. She expresses extreme discomfort of her back/abdomen, not necessarily improving, but her abdominal exam remains benign. Stop TPN, start calorie count. May need another CT scan over the weekend to aid in decision making/discharge planning since her sxs are still very vague and hard to interpret.   Failure to improve may warrant laparotomy, partial colectomy, end colostomy. She would be at increased risk for issues with wound healing and post-operative complications given her use of immunosuppressive therapies/steroids.   ID - rocephin/flagyl 9/6>>9/7, zosyn 9/7-9/9, ertapenem 9/9 >>; c.dif antigen positive 9/12, reviewed with ID and started fidaxomicin (10 days) FEN - SOFT, ensure, PICC/TPN - decrease support today VTE - SCDs, lovenox Foley - none    Klebsiella pneumoniae UTI  PMR on simponi/prednisone/MTX HTN HLD CAD Chronic back pain   I reviewed last 24 h vitals and pain scores, last 48 h intake and output, last 24 h labs and trends. Discussed with hospitalist.      LOS: 8 days   Obie Dredge, [facility] Please see Amion for pager number during day hours 7:00am-4:30pm

## 2022-07-20 NOTE — Telephone Encounter (Signed)
Submitted application for Dificid to [facility] for patient assistance. Patient has been approved until 11/04/2022. Medication will be shipped on 07/20/2022 for delivery on 07/21/2022.    Tracking Number: [id number]

## 2022-07-20 NOTE — Plan of Care (Signed)
Pt reports pain adequately controlled at this time.  Problem: Education: Goal: Knowledge of General Education information will improve Description: Including pain rating scale, medication(s)/side effects and non-pharmacologic comfort measures Outcome: Progressing   Problem: Health Behavior/Discharge Planning: Goal: Ability to manage health-related needs will improve Outcome: Progressing   Problem: Clinical Measurements: Goal: Ability to maintain clinical measurements within normal limits will improve Outcome: Progressing Goal: Will remain free from infection Outcome: Progressing Goal: Diagnostic test results will improve Outcome: Progressing Goal: Respiratory complications will improve Outcome: Progressing Goal: Cardiovascular complication will be avoided Outcome: Progressing   Problem: Activity: Goal: Risk for activity intolerance will decrease Outcome: Progressing   Problem: Nutrition: Goal: Adequate nutrition will be maintained Outcome: Progressing   Problem: Coping: Goal: Level of anxiety will decrease Outcome: Progressing   Problem: Elimination: Goal: Will not experience complications related to bowel motility Outcome: Progressing Goal: Will not experience complications related to urinary retention Outcome: Progressing   Problem: Pain Managment: Goal: General experience of comfort will improve Outcome: Progressing   Problem: Safety: Goal: Ability to remain free from injury will improve Outcome: Progressing   Problem: Skin Integrity: Goal: Risk for impaired skin integrity will decrease Outcome: Progressing   Problem: Education: Goal: Knowledge of General Education information will improve Description: Including pain rating scale, medication(s)/side effects and non-pharmacologic comfort measures Outcome: Progressing   Problem: Health Behavior/Discharge Planning: Goal: Ability to manage health-related needs will improve Outcome: Progressing   Problem:  Clinical Measurements: Goal: Ability to maintain clinical measurements within normal limits will improve Outcome: Progressing Goal: Will remain free from infection Outcome: Progressing Goal: Diagnostic test results will improve Outcome: Progressing Goal: Respiratory complications will improve Outcome: Progressing Goal: Cardiovascular complication will be avoided Outcome: Progressing   Problem: Activity: Goal: Risk for activity intolerance will decrease Outcome: Progressing   Problem: Nutrition: Goal: Adequate nutrition will be maintained Outcome: Progressing   Problem: Coping: Goal: Level of anxiety will decrease Outcome: Progressing   Problem: Elimination: Goal: Will not experience complications related to bowel motility Outcome: Progressing Goal: Will not experience complications related to urinary retention Outcome: Progressing   Problem: Pain Managment: Goal: General experience of comfort will improve Outcome: Progressing   Problem: Safety: Goal: Ability to remain free from injury will improve Outcome: Progressing   Problem: Skin Integrity: Goal: Risk for impaired skin integrity will decrease Outcome: Progressing

## 2022-07-20 NOTE — Progress Notes (Signed)
Mobility Specialist - Progress Note   07/20/22 0909  Mobility  Activity Ambulated with assistance in hallway  Level of Assistance Contact guard assist, steadying assist  Assistive Device None  Distance Ambulated (ft) 500 ft  Activity Response Tolerated well  $Mobility charge 1 Mobility   Pt received in bed, no c/o pain nor discomfort during ambulation. Pt returned to bed with all needs met and family in room with bed alarm on.   Roderick Pee Mobility Specialist

## 2022-07-20 NOTE — Progress Notes (Signed)
Nutrition Follow-up  DOCUMENTATION CODES:   Non-severe (moderate) malnutrition in context of acute illness/injury  INTERVENTION:   -48 hour Calorie Count per surgery -results 9/18  -Recommend continue 100 mg Thiamine daily until 9/18.  -Ensure Plus High Protein po BID, each supplement provides 350 kcal and 20 grams of protein.   -Banatrol fiber supplement BID, each provides 45 kcals, 2g protein and 5g soluble fiber to aid in diarrhea  NUTRITION DIAGNOSIS:   Moderate Malnutrition related to acute illness as evidenced by mild fat depletion, moderate muscle depletion, energy intake < 75% for > 7 days.  Ongoing.  GOAL:   Patient will meet greater than or equal to 90% of their needs  Progressing  MONITOR:   PO intake, Supplement acceptance, Labs, Weight trends, I & O's (TPN)  REASON FOR ASSESSMENT:   Consult Calorie Count  ASSESSMENT:   71 year old female with history of polymyalgia rheumatica, carotid artery stenosis left greater than right, hepatic hemangioma, hyperlipidemia, aortic atherosclerosis, hypertension, chronic back pain presented to the ED with abdominal pain left lower quadrant and suprapubic with nausea x3 days with associated chills, possible fever at home but no vomiting or diarrhea.  Patient's TPN d/c today. Calorie Count per surgery starting today. RD placed envelope on door. Results available 9/18. Pt trying to drink Ensure, still having BMs. Have added Banatrol to help with loose stools.   Admission weight: 159 lbs No other weights this admission.   Medications: calcium carbonate, Folic acid  Labs reviewed:  CBGs: 97-134  Diet Order:   Diet Order             DIET SOFT Room service appropriate? Yes; Fluid consistency: Thin  Diet effective now                   EDUCATION NEEDS:   Education needs have been addressed  Skin:  Skin Assessment: Reviewed RN Assessment  Last BM:  9/14  Height:   Ht Readings from Last 1 Encounters:   07/18/22 '5\' 3"'$  (1.6 m)    Weight:   Wt Readings from Last 1 Encounters:  07/16/22 72.3 kg    BMI:  Body mass index is 28.22 kg/m.  Estimated Nutritional Needs:   Kcal:  1800-2000  Protein:  80-90g  Fluid:  2L/day  Clayton Bibles, MS, RD, LDN Inpatient Clinical Dietitian Contact information available via Amion

## 2022-07-20 NOTE — Care Management Important Message (Signed)
Important Message  Patient Details IM Letter given to the Patient. Name: Jenny Glass MRN: [id number] Date of Birth: 1951-06-13   Medicare Important Message Given:  Yes     [name], [name] 07/20/2022, 10:22 AM

## 2022-07-20 NOTE — Progress Notes (Signed)
PROGRESS NOTE Jenny Glass  UXL:[id number] DOB: 03-16-1951 DOA: 07/11/2022 PCP: Merrilee Seashore, MD   Brief Narrative/Hospital Course: 70 year old female with history of polymyalgia rheumatica, carotid artery stenosis left greater than right, hepatic hemangioma, hyperlipidemia, aortic atherosclerosis, hypertension, chronic back pain presented to the ED with abdominal pain left lower quadrant and suprapubic with nausea x3 days with associated chills, possible fever at home but no vomiting or diarrhea.  Seen at PCP office and instructed to come to the ED. In the ED vitals stable, underwent lab work that showed mild leukocytosis abnormal UA, CT abdomen pelvis with acute sigmoid colon diverticulitis with small peridiverticular abscess/fluid collection 1.9 cm.  Patient was given LR 1 L, Rocephin 1 g, Flagyl, lactic acid ordered and admission requested.  Seen by surgery, antibiotic changed to Zosyn for complicated diverticulitis-being managed conservatively.  Initial Flagyl Rocephin/and switched to Zosyn > then to [facility] 07/14/22.  Remains in the hospital due to persistent abdominal pain, unable to tolerate oral intake, TPN.   Subjective:  Patient seen and examined.  Looks comfortable and was able to tolerate soft diet today and was very excited.  Still has intermittent abdominal pain but much better than earlier.  2-3 loose bowel movement overnight.   Assessment and Plan: Principal Problem:   Abscess of sigmoid colon due to diverticulitis Active Problems:   Hyperlipidemia   Chronic back pain   Hypertension   Carotid artery stenosis   UTI (urinary tract infection)   Diverticulitis of intestine with abscess   Malnutrition of moderate degree  Acute complicated sigmoid diverticulitis with a small abscess 1.9 cm in CT> increased to 2.6. Attempting conservative management to avoid surgery and colostomy.   Today on soft diet, adequate pain medications.  Taper off TPN. On different antibiotics  currently on Invanz will continue until clinical improvement. Loose stool prompted C. difficile testing, antigen positive for C. difficile but toxin is negative. Surgery recommended treatment with Dificid due to symptomatic disease though toxin is negative. Mobilize. Surgery closely following for possible need for laparotomy.  Moderate malnutrition:In the setting of decreased oral intake due to #1, dietitian following, advancing diet.  Wheezing/shortness of breath  cxr 9/8- any acute finding, on gentle IV fluid rate , encourage incentive spirometry, bronchodilators.  Klebsiella pneumoniae UTI : Has gotten adequate coverage   PMR on simponi inj q6-8 wk,methotrexate inj q wkly, and prednisone. Patient is immunocompromised status Patient is due for injection Simponi agents but will hold off due to patient's acute infection and will need to be reassessed by her rheumatologist. Cont her prednisone  Headache:continue Tylenol prn  Hyperlipidemia Hypertension Carotid artery stenosis: She has no chest pain. BP stable.Cont to hold amlodipine and losartan. Cont crestor 10 mg,   Chronic back pain: cont pain control.   DVT prophylaxis: enoxaparin (LOVENOX) injection 40 mg Start: 07/20/22 2200 Code Status:   Code Status: Full Code Family Communication: Husband at the bedside Patient status is: Remains hospitalized for continued IV antibiotics for complicated diverticulitis, TPN Level of care: Med-Surg  Dispo: The patient is from: home            Anticipated disposition: home  Mobility Assessment (last 72 hours)     Mobility Assessment     Row Name 07/19/22 1020 07/18/22 2035 07/18/22 2030 07/18/22 0940 07/17/22 2229   Does patient have an order for bedrest or is patient medically unstable No - Continue assessment No - Continue assessment No - Continue assessment No - Continue assessment No - Continue assessment  What is the highest level of mobility based on the progressive mobility  assessment? Level 6 (Walks independently in room and hall) - Balance while walking in room without assist - Complete Level 6 (Walks independently in room and hall) - Balance while walking in room without assist - Complete Level 6 (Walks independently in room and hall) - Balance while walking in room without assist - Complete Level 6 (Walks independently in room and hall) - Balance while walking in room without assist - Complete Level 6 (Walks independently in room and hall) - Balance while walking in room without assist - Complete    Row Name 07/17/22 1953           Does patient have an order for bedrest or is patient medically unstable No - Continue assessment       What is the highest level of mobility based on the progressive mobility assessment? Level 6 (Walks independently in room and hall) - Balance while walking in room without assist - Complete              Objective: Vitals last 24 hrs: Vitals:   07/19/22 0535 07/19/22 1427 07/19/22 2024 07/20/22 0625  BP: 111/74 104/63 101/70 112/68  Pulse: 72 74 72 69  Resp: '18 15 18 18  '$ Temp: 98.8 F (37.1 C) 98 F (36.7 C) 97.9 F (36.6 C) 97.6 F (36.4 C)  TempSrc: Oral Oral Oral Oral  SpO2: 95% 94% 93% 93%  Weight:      Height:       Weight change:   Physical Examination:  General: Looks comfortable.  Not in any distress. Cardiovascular: S1-S2 normal.  Regular rate rhythm. Respiratory: Bilateral clear.  No added sounds. Gastrointestinal: Soft.  Mild tenderness along the left flank and left lower quadrant.  No rigidity or guarding.  Bowel sounds present. Ext: No edema or swelling.  No cyanosis. Neuro: Alert oriented x4.  No focal neurological deficits.    Medications reviewed:  Scheduled Meds:  albuterol  2.5 mg Nebulization Once   calcium carbonate  1 tablet Oral Daily   Chlorhexidine Gluconate Cloth  6 each Topical Daily   enoxaparin (LOVENOX) injection  40 mg Subcutaneous Q24H   feeding supplement  237 mL Oral TID WC    fiber supplement (BANATROL TF)  60 mL Per Tube BID   fidaxomicin  200 mg Oral BID   folic acid  253 mcg Oral Daily   guaiFENesin  600 mg Oral BID   predniSONE  2 mg Oral Q breakfast   rosuvastatin  10 mg Oral Daily   sodium chloride flush  10-40 mL Intracatheter Q12H  Continuous Infusions:  sodium chloride Stopped (07/17/22 0844)   ertapenem 1,000 mg (07/20/22 1140)    Diet Order             DIET SOFT Room service appropriate? Yes; Fluid consistency: Thin  Diet effective now                  Intake/Output Summary (Last 24 hours) at 07/20/2022 1143 Last data filed at 07/20/2022 1029 Gross per 24 hour  Intake 1427.65 ml  Output --  Net 1427.65 ml    Net IO Since Admission: 7,354.77 mL [07/20/22 1143]  Wt Readings from Last 3 Encounters:  07/16/22 72.3 kg  06/20/22 74.4 kg  05/01/22 75.4 kg     Unresulted Labs (From admission, onward)    None     Data Reviewed: I have personally reviewed following labs and  imaging studies CBC: Recent Labs  Lab 07/14/22 0751 07/15/22 0605 07/16/22 0541 07/17/22 0550 07/18/22 0655  WBC 8.5 6.4 8.1 6.8 7.3  HGB 10.4* 10.1* 10.5* 10.6* 10.6*  HCT 33.1* 32.6* 32.9* 32.9* 32.9*  MCV 95.1 96.4 93.7 91.9 92.4  PLT 192 216 268 287 989    Basic Metabolic Panel: Recent Labs  Lab 07/15/22 0605 07/16/22 0541 07/17/22 0550 07/18/22 0655 07/18/22 0940 07/19/22 0600  NA 142 143 142 144  --  141  K 3.7 3.7 3.1* 3.5  --  4.0  CL 111 115* 112* 109  --  106  CO2 23 18* 22 30  --  31  GLUCOSE 71 70 121* 107*  --  92  BUN 7* 6* 6* 13  --  17  CREATININE 0.82 0.93 0.67 0.75  --  0.69  CALCIUM 8.2* 8.5* 8.5* 8.4*  --  8.9  MG  --   --  2.0  --  2.1 2.2  PHOS  --   --  2.3*  --  4.8* 3.6    GFR: Estimated Creatinine Clearance: 63.3 mL/min (by C-G formula based on SCr of 0.69 mg/dL). Liver Function Tests: Recent Labs  Lab 07/17/22 0550 07/19/22 0600  AST 17 21  ALT 15 17  ALKPHOS 54 53  BILITOT 0.6 0.4  PROT 6.3* 6.3*   ALBUMIN 3.1* 3.3*     Recent Results (from the past 240 hour(s))  Urine Culture     Status: Abnormal   Collection Time: 07/11/22  2:46 PM   Specimen: Urine, Clean Catch  Result Value Ref Range Status   Specimen Description   Final    URINE, CLEAN CATCH Performed at [facility] [place] [address] [place], [place] [address]    Special Requests   Final    NONE Performed at [facility], [place] [address] [place], [place] [address]    Culture >=100,000 COLONIES/mL KLEBSIELLA PNEUMONIAE (A)  Final   Report Status 07/13/2022 FINAL  Final   Organism ID, Bacteria KLEBSIELLA PNEUMONIAE (A)  Final      Susceptibility   Klebsiella pneumoniae - MIC*    AMPICILLIN RESISTANT Resistant     CEFAZOLIN <=4 SENSITIVE Sensitive     CEFEPIME <=0.12 SENSITIVE Sensitive     CEFTRIAXONE <=0.25 SENSITIVE Sensitive     CIPROFLOXACIN <=0.25 SENSITIVE Sensitive     GENTAMICIN <=1 SENSITIVE Sensitive     IMIPENEM <=0.25 SENSITIVE Sensitive     NITROFURANTOIN 64 INTERMEDIATE Intermediate     TRIMETH/SULFA <=20 SENSITIVE Sensitive     AMPICILLIN/SULBACTAM 4 SENSITIVE Sensitive     PIP/TAZO <=4 SENSITIVE Sensitive     * >=100,000 COLONIES/mL KLEBSIELLA PNEUMONIAE  C Difficile Quick Screen (NO PCR Reflex)     Status: Abnormal   Collection Time: 07/17/22 12:52 PM   Specimen: STOOL  Result Value Ref Range Status   C Diff antigen POSITIVE (A) NEGATIVE Final   C Diff toxin NEGATIVE NEGATIVE Final   C Diff interpretation   Final    Results are indeterminate. Please contact the provider listed for your campus for C diff questions in [place].    Comment: Performed at [facility], [place] [address] [place], [place] [address]    Antimicrobials: Anti-infectives (From admission, onward)    Start     Dose/Rate Route Frequency Ordered Stop   07/17/22 1530  fidaxomicin (DIFICID) tablet 200 mg        200 mg Oral 2 times daily  07/17/22 1431 07/27/22 0959    07/14/22 1100  ertapenem [facility]) injection 1,000 mg  Status:  Discontinued        1 g Intramuscular Daily 07/14/22 0935 07/14/22 1000   07/14/22 1100  ertapenem (INVANZ) 1,000 mg in sodium chloride 0.9 % 100 mL IVPB        1 g 200 mL/hr over 30 Minutes Intravenous Daily 07/14/22 1002     07/14/22 1030  ciprofloxacin (CIPRO) IVPB 400 mg  Status:  Discontinued        400 mg 200 mL/hr over 60 Minutes Intravenous Every 12 hours 07/14/22 0934 07/14/22 0935   07/14/22 1030  metroNIDAZOLE (FLAGYL) IVPB 500 mg  Status:  Discontinued        500 mg 100 mL/hr over 60 Minutes Intravenous Every 8 hours 07/14/22 0934 07/14/22 0935   07/12/22 1800  cefTRIAXone (ROCEPHIN) 2 g in sodium chloride 0.9 % 100 mL IVPB  Status:  Discontinued        2 g 200 mL/hr over 30 Minutes Intravenous Every 24 hours 07/11/22 1759 07/12/22 0836   07/12/22 0900  piperacillin-tazobactam (ZOSYN) IVPB 3.375 g  Status:  Discontinued        3.375 g 12.5 mL/hr over 240 Minutes Intravenous Every 8 hours 07/12/22 0836 07/14/22 0934   07/11/22 1800  cefTRIAXone (ROCEPHIN) 1 g in sodium chloride 0.9 % 100 mL IVPB        1 g 200 mL/hr over 30 Minutes Intravenous  Once 07/11/22 1759 07/11/22 2300   07/11/22 1730  metroNIDAZOLE (FLAGYL) IVPB 500 mg  Status:  Discontinued        500 mg 100 mL/hr over 60 Minutes Intravenous Every 12 hours 07/11/22 1726 07/12/22 0836   07/11/22 1545  cefTRIAXone (ROCEPHIN) 1 g in sodium chloride 0.9 % 100 mL IVPB        1 g 200 mL/hr over 30 Minutes Intravenous  Once 07/11/22 1540 07/11/22 1716      Culture/Microbiology    Component Value Date/Time   SDES  07/11/2022 1446    URINE, CLEAN CATCH Performed at [facility], [place] [address], [place], [place] [address]    SPECREQUEST  07/11/2022 1446    NONE Performed at [facility], [place] [address], [place], [place] [address]    CULT >=100,000 COLONIES/mL KLEBSIELLA PNEUMONIAE (A) 07/11/2022 1446   REPTSTATUS  07/13/2022 FINAL 07/11/2022 1446   Radiology Studies: No results found.   LOS: 8 days   Barb Merino, MD [facility]  07/20/2022, 11:43 AM

## 2022-07-21 DIAGNOSIS — K5720 Diverticulitis of large intestine with perforation and abscess without bleeding: Secondary | ICD-10-CM | POA: Diagnosis not present

## 2022-07-21 NOTE — Progress Notes (Signed)
PROGRESS NOTE Jenny Glass  UDJ:[id number] DOB: 12/28/1950 DOA: 07/11/2022 PCP: Merrilee Seashore, MD   Brief Narrative/Hospital Course: 71 year old female with history of polymyalgia rheumatica, carotid artery stenosis left greater than right, hepatic hemangioma, hyperlipidemia, aortic atherosclerosis, hypertension, chronic back pain presented to the ED with abdominal pain left lower quadrant and suprapubic with nausea x3 days with associated chills, possible fever at home but no vomiting or diarrhea.  Seen at PCP office and instructed to come to the ED. In the ED vitals stable, underwent lab work that showed mild leukocytosis abnormal UA, CT abdomen pelvis with acute sigmoid colon diverticulitis with small peridiverticular abscess/fluid collection 1.9 cm.  Patient was given LR 1 L, Rocephin 1 g, Flagyl, lactic acid ordered and admission requested.  Seen by surgery, antibiotic changed to Zosyn for complicated diverticulitis-being managed conservatively.  Initial Flagyl Rocephin/and switched to Zosyn > then to [facility] 07/14/22.  Remains in the hospital due to persistent abdominal pain, unable to tolerate oral intake, TPN.   Subjective:  Patient seen and examined.  Husband at the bedside.  She just walked with mobility tech.  Still has some abdominal pain but better than before.  Still has loose stool but she can make it to the bathroom.   Assessment and Plan: Principal Problem:   Abscess of sigmoid colon due to diverticulitis Active Problems:   Hyperlipidemia   Chronic back pain   Hypertension   Carotid artery stenosis   UTI (urinary tract infection)   Diverticulitis of intestine with abscess   Malnutrition of moderate degree  Acute complicated sigmoid diverticulitis with a small abscess 1.9 cm in CT> increased to 2.6. Attempting conservative management to avoid surgery and colostomy.   Some clinical improvement today.  On soft diet.  Adequate pain medications.  TPN off. Loose stool  prompted C. difficile testing, antigen positive for C. difficile but toxin is negative. Surgery recommended treatment with Dificid due to symptomatic disease though toxin is negative. Mobilize. Surgery closely following for possible need for laparotomy.  Moderate malnutrition:In the setting of decreased oral intake due to #1, dietitian following, advancing diet.  Wheezing/shortness of breath  cxr 9/8- any acute finding, on gentle IV fluid rate , encourage incentive spirometry, bronchodilators.  Klebsiella pneumoniae UTI : Has gotten adequate coverage   PMR on simponi inj q6-8 wk,methotrexate inj q wkly, and prednisone. Patient is immunocompromised status Patient is due for injection Simponi agents but will hold off due to patient's acute infection and will need to be reassessed by her rheumatologist. Cont her prednisone  Headache:continue Tylenol prn  Hyperlipidemia Hypertension Carotid artery stenosis: She has no chest pain. BP stable.Cont to hold amlodipine and losartan. Cont crestor 10 mg,   Chronic back pain: cont pain control.   DVT prophylaxis: enoxaparin (LOVENOX) injection 40 mg Start: 07/20/22 2200 Code Status:   Code Status: Full Code Family Communication: Husband at the bedside Patient status is: Remains hospitalized for continued IV antibiotics for complicated diverticulitis, TPN Level of care: Med-Surg  Dispo: The patient is from: home            Anticipated disposition: home  Mobility Assessment (last 72 hours)     Mobility Assessment     Row Name 07/21/22 0930 07/20/22 1000 07/19/22 1020 07/18/22 2035 07/18/22 2030   Does patient have an order for bedrest or is patient medically unstable No - Continue assessment No - Continue assessment No - Continue assessment No - Continue assessment No - Continue assessment   What is the  highest level of mobility based on the progressive mobility assessment? Level 6 (Walks independently in room and hall) - Balance while walking  in room without assist - Complete Level 6 (Walks independently in room and hall) - Balance while walking in room without assist - Complete Level 6 (Walks independently in room and hall) - Balance while walking in room without assist - Complete Level 6 (Walks independently in room and hall) - Balance while walking in room without assist - Complete Level 6 (Walks independently in room and hall) - Balance while walking in room without assist - Complete          Objective: Vitals last 24 hrs: Vitals:   07/20/22 0625 07/20/22 1530 07/20/22 2151 07/21/22 0633  BP: 112/68 113/72 130/79 124/81  Pulse: 69 71 70 73  Resp: '18 18 14 14  '$ Temp: 97.6 F (36.4 C) 98.7 F (37.1 C) 98.7 F (37.1 C) 97.7 F (36.5 C)  TempSrc: Oral Oral Oral Oral  SpO2: 93% 97% 98% 98%  Weight:      Height:       Weight change:   Physical Examination:  General: Looks fairly comfortable walking with the nursing staff.  Appropriately anxious. Cardiovascular: S1-S2 normal.  Regular rate rhythm. Respiratory: Bilateral clear.  No added sounds. Gastrointestinal: Soft.  Minimally tender on the left lateral quadrant with no rigidity or guarding.  Bowel sound present. Ext: No swelling or edema. Neuro: Alert oriented x4.  No neurological deficits. Musculoskeletal: No deformities. Skin: Intact.     Medications reviewed:  Scheduled Meds:  calcium carbonate  1 tablet Oral Daily   Chlorhexidine Gluconate Cloth  6 each Topical Daily   enoxaparin (LOVENOX) injection  40 mg Subcutaneous Q24H   feeding supplement  237 mL Oral TID WC   fiber supplement (BANATROL TF)  60 mL Oral BID   fidaxomicin  200 mg Oral BID   folic acid  001 mcg Oral Daily   guaiFENesin  600 mg Oral BID   predniSONE  2 mg Oral Q breakfast   rosuvastatin  10 mg Oral Daily   sodium chloride flush  10-40 mL Intracatheter Q12H  Continuous Infusions:  sodium chloride Stopped (07/17/22 0844)   ertapenem 1,000 mg (07/21/22 1010)    Diet Order              DIET SOFT Room service appropriate? Yes; Fluid consistency: Thin  Diet effective now                  Intake/Output Summary (Last 24 hours) at 07/21/2022 1357 Last data filed at 07/21/2022 1100 Gross per 24 hour  Intake 227 ml  Output --  Net 227 ml    Net IO Since Admission: 7,816.77 mL [07/21/22 1357]  Wt Readings from Last 3 Encounters:  07/16/22 72.3 kg  06/20/22 74.4 kg  05/01/22 75.4 kg     Unresulted Labs (From admission, onward)    None     Data Reviewed: I have personally reviewed following labs and imaging studies CBC: Recent Labs  Lab 07/15/22 0605 07/16/22 0541 07/17/22 0550 07/18/22 0655  WBC 6.4 8.1 6.8 7.3  HGB 10.1* 10.5* 10.6* 10.6*  HCT 32.6* 32.9* 32.9* 32.9*  MCV 96.4 93.7 91.9 92.4  PLT 216 268 287 749    Basic Metabolic Panel: Recent Labs  Lab 07/15/22 0605 07/16/22 0541 07/17/22 0550 07/18/22 0655 07/18/22 0940 07/19/22 0600  NA 142 143 142 144  --  141  K 3.7 3.7 3.1* 3.5  --  4.0  CL 111 115* 112* 109  --  106  CO2 23 18* 22 30  --  31  GLUCOSE 71 70 121* 107*  --  92  BUN 7* 6* 6* 13  --  17  CREATININE 0.82 0.93 0.67 0.75  --  0.69  CALCIUM 8.2* 8.5* 8.5* 8.4*  --  8.9  MG  --   --  2.0  --  2.1 2.2  PHOS  --   --  2.3*  --  4.8* 3.6    GFR: Estimated Creatinine Clearance: 63.3 mL/min (by C-G formula based on SCr of 0.69 mg/dL). Liver Function Tests: Recent Labs  Lab 07/17/22 0550 07/19/22 0600  AST 17 21  ALT 15 17  ALKPHOS 54 53  BILITOT 0.6 0.4  PROT 6.3* 6.3*  ALBUMIN 3.1* 3.3*     Recent Results (from the past 240 hour(s))  Urine Culture     Status: Abnormal   Collection Time: 07/11/22  2:46 PM   Specimen: Urine, Clean Catch  Result Value Ref Range Status   Specimen Description   Final    URINE, CLEAN CATCH Performed at [facility], [place] [address], [place], [place] [address]    Special Requests   Final    NONE Performed at [facility], [place] [address], [place], [place] [address]    Culture >=100,000 COLONIES/mL KLEBSIELLA PNEUMONIAE (A)  Final   Report Status 07/13/2022 FINAL  Final   Organism ID, Bacteria KLEBSIELLA PNEUMONIAE (A)  Final      Susceptibility   Klebsiella pneumoniae - MIC*    AMPICILLIN RESISTANT Resistant     CEFAZOLIN <=4 SENSITIVE Sensitive     CEFEPIME <=0.12 SENSITIVE Sensitive     CEFTRIAXONE <=0.25 SENSITIVE Sensitive     CIPROFLOXACIN <=0.25 SENSITIVE Sensitive     GENTAMICIN <=1 SENSITIVE Sensitive     IMIPENEM <=0.25 SENSITIVE Sensitive     NITROFURANTOIN 64 INTERMEDIATE Intermediate     TRIMETH/SULFA <=20 SENSITIVE Sensitive     AMPICILLIN/SULBACTAM 4 SENSITIVE Sensitive     PIP/TAZO <=4 SENSITIVE Sensitive     * >=100,000 COLONIES/mL KLEBSIELLA PNEUMONIAE  C Difficile Quick Screen (NO PCR Reflex)     Status: Abnormal   Collection Time: 07/17/22 12:52 PM   Specimen: STOOL  Result Value Ref Range Status   C Diff antigen POSITIVE (A) NEGATIVE Final   C Diff toxin NEGATIVE NEGATIVE Final   C Diff interpretation   Final    Results are indeterminate. Please contact the provider listed for your campus for C diff questions in [place].    Comment: Performed at [facility], [place] [address], [place], [place] [address]    Antimicrobials: Anti-infectives (From admission, onward)    Start     Dose/Rate Route Frequency Ordered Stop   07/17/22 1530  fidaxomicin (DIFICID) tablet 200 mg        200 mg Oral 2 times daily 07/17/22 1431 07/27/22 0959   07/14/22 1100  ertapenem [facility]) injection 1,000 mg  Status:  Discontinued        1 g Intramuscular Daily 07/14/22 0935 07/14/22 1000   07/14/22 1100  ertapenem (INVANZ) 1,000 mg in sodium chloride 0.9 % 100 mL IVPB        1 g 200 mL/hr over 30 Minutes Intravenous Daily 07/14/22 1002     07/14/22 1030  ciprofloxacin (CIPRO) IVPB 400 mg  Status:  Discontinued        400 mg  200 mL/hr over 60 Minutes Intravenous Every 12 hours 07/14/22 0934 07/14/22 0935    07/14/22 1030  metroNIDAZOLE (FLAGYL) IVPB 500 mg  Status:  Discontinued        500 mg 100 mL/hr over 60 Minutes Intravenous Every 8 hours 07/14/22 0934 07/14/22 0935   07/12/22 1800  cefTRIAXone (ROCEPHIN) 2 g in sodium chloride 0.9 % 100 mL IVPB  Status:  Discontinued        2 g 200 mL/hr over 30 Minutes Intravenous Every 24 hours 07/11/22 1759 07/12/22 0836   07/12/22 0900  piperacillin-tazobactam (ZOSYN) IVPB 3.375 g  Status:  Discontinued        3.375 g 12.5 mL/hr over 240 Minutes Intravenous Every 8 hours 07/12/22 0836 07/14/22 0934   07/11/22 1800  cefTRIAXone (ROCEPHIN) 1 g in sodium chloride 0.9 % 100 mL IVPB        1 g 200 mL/hr over 30 Minutes Intravenous  Once 07/11/22 1759 07/11/22 2300   07/11/22 1730  metroNIDAZOLE (FLAGYL) IVPB 500 mg  Status:  Discontinued        500 mg 100 mL/hr over 60 Minutes Intravenous Every 12 hours 07/11/22 1726 07/12/22 0836   07/11/22 1545  cefTRIAXone (ROCEPHIN) 1 g in sodium chloride 0.9 % 100 mL IVPB        1 g 200 mL/hr over 30 Minutes Intravenous  Once 07/11/22 1540 07/11/22 1716      Culture/Microbiology    Component Value Date/Time   SDES  07/11/2022 1446    URINE, CLEAN CATCH Performed at [facility], [place] [address] [place], [place] [address]    SPECREQUEST  07/11/2022 1446    NONE Performed at [facility], [place] [address] [place], [place] [address]    CULT >=100,000 COLONIES/mL KLEBSIELLA PNEUMONIAE (A) 07/11/2022 1446   REPTSTATUS 07/13/2022 FINAL 07/11/2022 1446   Radiology Studies: No results found.   LOS: 9 days   Barb Merino, MD [facility]  07/21/2022, 1:57 PM

## 2022-07-21 NOTE — Progress Notes (Signed)
Subjective/Chief Complaint: Up in chair eating breakfast.  Looks comfortable.  Some left lower quadrant pain but not severe she states.  Bowel movements are less.  Occasional crampy pain.   Objective: Vital signs in last 24 hours: Temp:  [97.7 F (36.5 C)-98.7 F (37.1 C)] 97.7 F (36.5 C) (09/16 9476) Pulse Rate:  [70-73] 73 (09/16 0633) Resp:  [14-18] 14 (09/16 5465) BP: (113-130)/(72-81) 124/81 (09/16 0354) SpO2:  [97 %-98 %] 98 % (09/16 0633) Last BM Date : 07/20/20  Intake/Output from previous day: 09/15 0701 - 09/16 0700 In: 340 [P.O.:235; I.V.:5; IV Piggyback:100] Out: -  Intake/Output this shift: No intake/output data recorded.   Gen:  Alert, NAD, pleasant Looks overall less fatigued Cardio: RRR Pulm: CTAB, no wheezing, rate and effort normal  Abd: soft, ND, mild mostly subjective TTP in LLQ without guarding No rebound Lab Results:  No results for input(s): "WBC", "HGB", "HCT", "PLT" in the last 72 hours. BMET Recent Labs    07/19/22 0600  NA 141  K 4.0  CL 106  CO2 31  GLUCOSE 92  BUN 17  CREATININE 0.69  CALCIUM 8.9   PT/INR No results for input(s): "LABPROT", "INR" in the last 72 hours. ABG No results for input(s): "PHART", "HCO3" in the last 72 hours.  Invalid input(s): "PCO2", "PO2"  Studies/Results: No results found.  Anti-infectives: Anti-infectives (From admission, onward)    Start     Dose/Rate Route Frequency Ordered Stop   07/17/22 1530  fidaxomicin (DIFICID) tablet 200 mg        200 mg Oral 2 times daily 07/17/22 1431 07/27/22 0959   07/14/22 1100  ertapenem [facility]) injection 1,000 mg  Status:  Discontinued        1 g Intramuscular Daily 07/14/22 0935 07/14/22 1000   07/14/22 1100  ertapenem (INVANZ) 1,000 mg in sodium chloride 0.9 % 100 mL IVPB        1 g 200 mL/hr over 30 Minutes Intravenous Daily 07/14/22 1002     07/14/22 1030  ciprofloxacin (CIPRO) IVPB 400 mg  Status:  Discontinued        400 mg 200 mL/hr over 60  Minutes Intravenous Every 12 hours 07/14/22 0934 07/14/22 0935   07/14/22 1030  metroNIDAZOLE (FLAGYL) IVPB 500 mg  Status:  Discontinued        500 mg 100 mL/hr over 60 Minutes Intravenous Every 8 hours 07/14/22 0934 07/14/22 0935   07/12/22 1800  cefTRIAXone (ROCEPHIN) 2 g in sodium chloride 0.9 % 100 mL IVPB  Status:  Discontinued        2 g 200 mL/hr over 30 Minutes Intravenous Every 24 hours 07/11/22 1759 07/12/22 0836   07/12/22 0900  piperacillin-tazobactam (ZOSYN) IVPB 3.375 g  Status:  Discontinued        3.375 g 12.5 mL/hr over 240 Minutes Intravenous Every 8 hours 07/12/22 0836 07/14/22 0934   07/11/22 1800  cefTRIAXone (ROCEPHIN) 1 g in sodium chloride 0.9 % 100 mL IVPB        1 g 200 mL/hr over 30 Minutes Intravenous  Once 07/11/22 1759 07/11/22 2300   07/11/22 1730  metroNIDAZOLE (FLAGYL) IVPB 500 mg  Status:  Discontinued        500 mg 100 mL/hr over 60 Minutes Intravenous Every 12 hours 07/11/22 1726 07/12/22 0836   07/11/22 1545  cefTRIAXone (ROCEPHIN) 1 g in sodium chloride 0.9 % 100 mL IVPB        1 g 200 mL/hr over 30  Minutes Intravenous  Once 07/11/22 1540 07/11/22 1716       Assessment/Plan:     Diverticulitis with small abscess - afebrile, normal WBC, on steroids/MTX - 1st bout, last colonoscopy 2013 - CT scan 9/6 shows Acute sigmoid colon diverticulitis with small peridiverticular abscess/fluid collection, 1.9 cm in greatest dimension - CT 9/10 w/ similar amount of inflammatory changes around sigmoid colon and slight increase in fluid collection - 2.6cm  -Overall looks better.  C. difficile seems to be improving.  Continue antibiotics.  No acute surgical need  She has had issues in the past and may benefit from outpatient referral to one of her colorectal surgeons.  Dr. Dema Severin is seeing her last week.  Failure to improve may warrant laparotomy, partial colectomy, end colostomy. She would be at increased risk for issues with wound healing and  post-operative complications given her use of immunosuppressive therapies/steroids.  Overall she seems better and I doubt surgical intervention will be necessary but we will follow with you.   ID - rocephin/flagyl 9/6>>9/7, zosyn 9/7-9/9, ertapenem 9/9 >>; c.dif antigen positive 9/12, reviewed with ID and started fidaxomicin (10 days) FEN - SOFT, ensure, PICC/TPN - decrease support today VTE - SCDs, lovenox Foley - none Total time 20 minutes moderate complexity   Klebsiella pneumoniae UTI  PMR on simponi/prednisone/MTX HTN HLD CAD Chronic back pain Jenny Glass Spang  MD 07/21/2022

## 2022-07-21 NOTE — Plan of Care (Signed)
Pt reports no pain this am. Minimal food intake overnight and this morning.  Pt reports that food "doesn't taste right".  Problem: Nutrition: Goal: Adequate nutrition will be maintained Outcome: Not Progressing   Problem: Education: Goal: Knowledge of General Education information will improve Description: Including pain rating scale, medication(s)/side effects and non-pharmacologic comfort measures Outcome: Progressing   Problem: Health Behavior/Discharge Planning: Goal: Ability to manage health-related needs will improve Outcome: Progressing   Problem: Clinical Measurements: Goal: Ability to maintain clinical measurements within normal limits will improve Outcome: Progressing Goal: Will remain free from infection Outcome: Progressing Goal: Diagnostic test results will improve Outcome: Progressing Goal: Respiratory complications will improve Outcome: Progressing Goal: Cardiovascular complication will be avoided Outcome: Progressing   Problem: Activity: Goal: Risk for activity intolerance will decrease Outcome: Progressing   Problem: Coping: Goal: Level of anxiety will decrease Outcome: Progressing   Problem: Elimination: Goal: Will not experience complications related to bowel motility Outcome: Progressing Goal: Will not experience complications related to urinary retention Outcome: Progressing   Problem: Pain Managment: Goal: General experience of comfort will improve Outcome: Progressing   Problem: Safety: Goal: Ability to remain free from injury will improve Outcome: Progressing   Problem: Skin Integrity: Goal: Risk for impaired skin integrity will decrease Outcome: Progressing   Problem: Education: Goal: Knowledge of General Education information will improve Description: Including pain rating scale, medication(s)/side effects and non-pharmacologic comfort measures Outcome: Progressing   Problem: Health Behavior/Discharge Planning: Goal: Ability to  manage health-related needs will improve Outcome: Progressing   Problem: Clinical Measurements: Goal: Ability to maintain clinical measurements within normal limits will improve Outcome: Progressing Goal: Will remain free from infection Outcome: Progressing Goal: Diagnostic test results will improve Outcome: Progressing Goal: Respiratory complications will improve Outcome: Progressing Goal: Cardiovascular complication will be avoided Outcome: Progressing   Problem: Activity: Goal: Risk for activity intolerance will decrease Outcome: Progressing   Problem: Nutrition: Goal: Adequate nutrition will be maintained Outcome: Progressing   Problem: Coping: Goal: Level of anxiety will decrease Outcome: Progressing   Problem: Elimination: Goal: Will not experience complications related to bowel motility Outcome: Progressing Goal: Will not experience complications related to urinary retention Outcome: Progressing   Problem: Pain Managment: Goal: General experience of comfort will improve Outcome: Progressing   Problem: Safety: Goal: Ability to remain free from injury will improve Outcome: Progressing   Problem: Skin Integrity: Goal: Risk for impaired skin integrity will decrease Outcome: Progressing

## 2022-07-21 NOTE — Progress Notes (Signed)
Mobility Specialist - Progress Note   07/21/22 1047  Mobility  HOB Elevated/Bed Position Self regulated  Activity Ambulated with assistance in hallway  Range of Motion/Exercises Active  Level of Assistance Modified independent, requires aide device or extra time  Assistive Device  (IV Pole)  Distance Ambulated (ft) 145 ft  Activity Response Tolerated well  Transport method Ambulatory  $Mobility charge 1 Mobility   Pt received in bed and agreeable to mobility. No complaints during ambulation. Pt to bed after session with all needs met.     [facility]

## 2022-07-22 DIAGNOSIS — K5720 Diverticulitis of large intestine with perforation and abscess without bleeding: Secondary | ICD-10-CM | POA: Diagnosis not present

## 2022-07-22 LAB — CBC
HCT: 40.9 % (ref 36.0–46.0)
Hemoglobin: 12.9 g/dL (ref 12.0–15.0)
MCH: 29.5 pg (ref 26.0–34.0)
MCHC: 31.5 g/dL (ref 30.0–36.0)
MCV: 93.4 fL (ref 80.0–100.0)
Platelets: 319 10*3/uL (ref 150–400)
RBC: 4.38 MIL/uL (ref 3.87–5.11)
RDW: 15 % (ref 11.5–15.5)
WBC: 8.2 10*3/uL (ref 4.0–10.5)
nRBC: 0 % (ref 0.0–0.2)

## 2022-07-22 NOTE — Progress Notes (Signed)
PROGRESS NOTE Jenny Glass  RSW:[id number] DOB: Jan 30, 1951 DOA: 07/11/2022 PCP: Merrilee Seashore, MD   Brief Narrative/Hospital Course: 71 year old female with history of polymyalgia rheumatica, carotid artery stenosis left greater than right, hepatic hemangioma, hyperlipidemia, aortic atherosclerosis, hypertension, chronic back pain presented to the ED with abdominal pain left lower quadrant and suprapubic with nausea x3 days with associated chills, possible fever at home but no vomiting or diarrhea.  Seen at PCP office and instructed to come to the ED. In the ED vitals stable, underwent lab work that showed mild leukocytosis abnormal UA, CT abdomen pelvis with acute sigmoid colon diverticulitis with small peridiverticular abscess/fluid collection 1.9 cm.  Patient was given LR 1 L, Rocephin 1 g, Flagyl, lactic acid ordered and admission requested.  Seen by surgery, antibiotic changed to Zosyn for complicated diverticulitis-being managed conservatively.  Initial Flagyl Rocephin/and switched to Zosyn > then to [facility] 07/14/22.  Remains in the hospital due to persistent abdominal pain, unable to tolerate oral intake, TPN.   Subjective: Patient seen and examined.  Eating about half of the meal.  Difficult historian.  Looks comfortable.  She tells me that she is going to bathroom as often as she was going before.  Abdominal pain persist but better than before.  Looks pleasant.   Assessment and Plan: Principal Problem:   Abscess of sigmoid colon due to diverticulitis Active Problems:   Hyperlipidemia   Chronic back pain   Hypertension   Carotid artery stenosis   UTI (urinary tract infection)   Diverticulitis of intestine with abscess   Malnutrition of moderate degree  Acute complicated sigmoid diverticulitis with a small abscess 1.9 cm in CT> increased to 2.6. Attempting conservative management to avoid surgery and colostomy.   Some clinical improvement today.  On soft diet.  Adequate pain  medications.  TPN off. Loose stool prompted C. difficile testing, antigen positive for C. difficile but toxin is negative. Surgery recommended treatment with Dificid due to symptomatic disease though toxin is negative. Mobilize. Surgery closely following for possible need for laparotomy. Repeat CT scan planned for tomorrow.  Moderate malnutrition:In the setting of decreased oral intake due to #1, dietitian following, advancing diet.  Off TPN.  Klebsiella pneumoniae UTI : Has gotten adequate coverage   PMR on simponi inj q6-8 wk,methotrexate inj q wkly, and prednisone. Patient is immunocompromised status Patient is due for injection Simponi agents but will hold off due to patient's acute infection and will need to be reassessed by her rheumatologist. Cont her prednisone  Headache:continue Tylenol prn  Hyperlipidemia Hypertension Carotid artery stenosis: She has no chest pain. BP stable.Cont to hold amlodipine and losartan. Cont crestor 10 mg,   Chronic back pain: cont pain control.   DVT prophylaxis: enoxaparin (LOVENOX) injection 40 mg Start: 07/20/22 2200 Code Status:   Code Status: Full Code Family Communication: Husband at the bedside Patient status is: Remains hospitalized for continued IV antibiotics for complicated diverticulitis, T Level of care: Med-Surg  Dispo: The patient is from: home            Anticipated disposition: home  Mobility Assessment (last 72 hours)     Mobility Assessment     Row Name 07/22/22 0734 07/21/22 0930 07/20/22 1000       Does patient have an order for bedrest or is patient medically unstable No - Continue assessment No - Continue assessment No - Continue assessment     What is the highest level of mobility based on the progressive mobility assessment? Level 6 (  Walks independently in room and hall) - Balance while walking in room without assist - Complete Level 6 (Walks independently in room and hall) - Balance while walking in room without  assist - Complete Level 6 (Walks independently in room and hall) - Balance while walking in room without assist - Complete            Objective: Vitals last 24 hrs: Vitals:   07/21/22 1511 07/21/22 2135 07/22/22 0516 07/22/22 0517  BP: (!) 131/103 117/72 105/73 105/70  Pulse: 81 78 80 77  Resp: '19 14 14 14  '$ Temp: 98.8 F (37.1 C) 98.9 F (37.2 C) 98.7 F (37.1 C) 98.3 F (36.8 C)  TempSrc: Oral Oral Oral Oral  SpO2: 96% 96% 94% 92%  Weight:      Height:       Weight change:   Physical Examination:  General: Looks comfortable sitting in couch. Cardiovascular: S1-S2 normal regular rate rhythm. Respiratory: Bilateral clear.  No added sounds. Gastrointestinal: Soft.  Diffusely tender left lower quadrant without rigidity or guarding.  Bowel sound present. Ext: No edema or cyanosis.  No swelling. Neuro: Alert and oriented x4.  Stable.     Medications reviewed:  Scheduled Meds:  calcium carbonate  1 tablet Oral Daily   Chlorhexidine Gluconate Cloth  6 each Topical Daily   enoxaparin (LOVENOX) injection  40 mg Subcutaneous Q24H   feeding supplement  237 mL Oral TID WC   fiber supplement (BANATROL TF)  60 mL Oral BID   fidaxomicin  200 mg Oral BID   folic acid  829 mcg Oral Daily   guaiFENesin  600 mg Oral BID   predniSONE  2 mg Oral Q breakfast   rosuvastatin  10 mg Oral Daily   sodium chloride flush  10-40 mL Intracatheter Q12H  Continuous Infusions:  sodium chloride Stopped (07/17/22 0844)   ertapenem 1,000 mg (07/22/22 0852)    Diet Order             DIET SOFT Room service appropriate? Yes; Fluid consistency: Thin  Diet effective now                  Intake/Output Summary (Last 24 hours) at 07/22/2022 1048 Last data filed at 07/21/2022 1300 Gross per 24 hour  Intake 352 ml  Output --  Net 352 ml   Net IO Since Admission: 8,051.77 mL [07/22/22 1048]  Wt Readings from Last 3 Encounters:  07/16/22 72.3 kg  06/20/22 74.4 kg  05/01/22 75.4 kg      Unresulted Labs (From admission, onward)    None     Data Reviewed: I have personally reviewed following labs and imaging studies CBC: Recent Labs  Lab 07/16/22 0541 07/17/22 0550 07/18/22 0655 07/22/22 0900  WBC 8.1 6.8 7.3 8.2  HGB 10.5* 10.6* 10.6* 12.9  HCT 32.9* 32.9* 32.9* 40.9  MCV 93.7 91.9 92.4 93.4  PLT 268 287 287 562   Basic Metabolic Panel: Recent Labs  Lab 07/16/22 0541 07/17/22 0550 07/18/22 0655 07/18/22 0940 07/19/22 0600  NA 143 142 144  --  141  K 3.7 3.1* 3.5  --  4.0  CL 115* 112* 109  --  106  CO2 18* 22 30  --  31  GLUCOSE 70 121* 107*  --  92  BUN 6* 6* 13  --  17  CREATININE 0.93 0.67 0.75  --  0.69  CALCIUM 8.5* 8.5* 8.4*  --  8.9  MG  --  2.0  --  2.1 2.2  PHOS  --  2.3*  --  4.8* 3.6   GFR: Estimated Creatinine Clearance: 63.3 mL/min (by C-G formula based on SCr of 0.69 mg/dL). Liver Function Tests: Recent Labs  Lab 07/17/22 0550 07/19/22 0600  AST 17 21  ALT 15 17  ALKPHOS 54 53  BILITOT 0.6 0.4  PROT 6.3* 6.3*  ALBUMIN 3.1* 3.3*    Recent Results (from the past 240 hour(s))  C Difficile Quick Screen (NO PCR Reflex)     Status: Abnormal   Collection Time: 07/17/22 12:52 PM   Specimen: STOOL  Result Value Ref Range Status   C Diff antigen POSITIVE (A) NEGATIVE Final   C Diff toxin NEGATIVE NEGATIVE Final   C Diff interpretation   Final    Results are indeterminate. Please contact the provider listed for your campus for C diff questions in [place].    Comment: Performed at [facility], [place] [address], [place], [place] [address]    Antimicrobials: Anti-infectives (From admission, onward)    Start     Dose/Rate Route Frequency Ordered Stop   07/17/22 1530  fidaxomicin (DIFICID) tablet 200 mg        200 mg Oral 2 times daily 07/17/22 1431 07/27/22 0959   07/14/22 1100  ertapenem [facility]) injection 1,000 mg  Status:  Discontinued        1 g Intramuscular Daily 07/14/22 0935 07/14/22 1000   07/14/22  1100  ertapenem (INVANZ) 1,000 mg in sodium chloride 0.9 % 100 mL IVPB        1 g 200 mL/hr over 30 Minutes Intravenous Daily 07/14/22 1002     07/14/22 1030  ciprofloxacin (CIPRO) IVPB 400 mg  Status:  Discontinued        400 mg 200 mL/hr over 60 Minutes Intravenous Every 12 hours 07/14/22 0934 07/14/22 0935   07/14/22 1030  metroNIDAZOLE (FLAGYL) IVPB 500 mg  Status:  Discontinued        500 mg 100 mL/hr over 60 Minutes Intravenous Every 8 hours 07/14/22 0934 07/14/22 0935   07/12/22 1800  cefTRIAXone (ROCEPHIN) 2 g in sodium chloride 0.9 % 100 mL IVPB  Status:  Discontinued        2 g 200 mL/hr over 30 Minutes Intravenous Every 24 hours 07/11/22 1759 07/12/22 0836   07/12/22 0900  piperacillin-tazobactam (ZOSYN) IVPB 3.375 g  Status:  Discontinued        3.375 g 12.5 mL/hr over 240 Minutes Intravenous Every 8 hours 07/12/22 0836 07/14/22 0934   07/11/22 1800  cefTRIAXone (ROCEPHIN) 1 g in sodium chloride 0.9 % 100 mL IVPB        1 g 200 mL/hr over 30 Minutes Intravenous  Once 07/11/22 1759 07/11/22 2300   07/11/22 1730  metroNIDAZOLE (FLAGYL) IVPB 500 mg  Status:  Discontinued        500 mg 100 mL/hr over 60 Minutes Intravenous Every 12 hours 07/11/22 1726 07/12/22 0836   07/11/22 1545  cefTRIAXone (ROCEPHIN) 1 g in sodium chloride 0.9 % 100 mL IVPB        1 g 200 mL/hr over 30 Minutes Intravenous  Once 07/11/22 1540 07/11/22 1716      Culture/Microbiology    Component Value Date/Time   SDES  07/11/2022 1446    URINE, CLEAN CATCH Performed at [facility], [place] [address], [place], [place] [address]    SPECREQUEST  07/11/2022 1446    NONE Performed at [facility],  [place] [address], [place], [place] [address]    CULT >=100,000 COLONIES/mL KLEBSIELLA PNEUMONIAE (A) 07/11/2022 1446   REPTSTATUS 07/13/2022 FINAL 07/11/2022 1446   Radiology Studies: No results found.   LOS: 10 days   Barb Merino, MD [facility]  07/22/2022,  10:48 AM

## 2022-07-22 NOTE — Progress Notes (Signed)
Subjective/Chief Complaint: Patient feels about the same.  Still has lower crampy abdominal pain.  Diarrhea is still present but she thinks a little bit better.  She has a lot of pressure in her stomach after she eats.   Objective: Vital signs in last 24 hours: Temp:  [98.3 F (36.8 C)-98.9 F (37.2 C)] 98.3 F (36.8 C) (09/17 0517) Pulse Rate:  [77-81] 77 (09/17 0517) Resp:  [14-19] 14 (09/17 0517) BP: (105-131)/(70-103) 105/70 (09/17 0517) SpO2:  [92 %-96 %] 92 % (09/17 0517) Last BM Date : 07/21/22  Intake/Output from previous day: 09/16 0701 - 09/17 0700 In: 362 [P.O.:352; I.V.:10] Out: -  Intake/Output this shift: No intake/output data recorded.   Gen:  Alert, NAD, pleasant Looks overall less fatigued Cardio: RRR Pulm: CTAB, no wheezing, rate and effort normal  Abd: soft, ND, mild mostly subjective TTP in LLQ without guarding No rebound Lab Results:  No results for input(s): "WBC", "HGB", "HCT", "PLT" in the last 72 hours. BMET No results for input(s): "NA", "K", "CL", "CO2", "GLUCOSE", "BUN", "CREATININE", "CALCIUM" in the last 72 hours. PT/INR No results for input(s): "LABPROT", "INR" in the last 72 hours. ABG No results for input(s): "PHART", "HCO3" in the last 72 hours.  Invalid input(s): "PCO2", "PO2"  Studies/Results: No results found.  Anti-infectives: Anti-infectives (From admission, onward)    Start     Dose/Rate Route Frequency Ordered Stop   07/17/22 1530  fidaxomicin (DIFICID) tablet 200 mg        200 mg Oral 2 times daily 07/17/22 1431 07/27/22 0959   07/14/22 1100  ertapenem [facility]) injection 1,000 mg  Status:  Discontinued        1 g Intramuscular Daily 07/14/22 0935 07/14/22 1000   07/14/22 1100  ertapenem (INVANZ) 1,000 mg in sodium chloride 0.9 % 100 mL IVPB        1 g 200 mL/hr over 30 Minutes Intravenous Daily 07/14/22 1002     07/14/22 1030  ciprofloxacin (CIPRO) IVPB 400 mg  Status:  Discontinued        400 mg 200 mL/hr over 60  Minutes Intravenous Every 12 hours 07/14/22 0934 07/14/22 0935   07/14/22 1030  metroNIDAZOLE (FLAGYL) IVPB 500 mg  Status:  Discontinued        500 mg 100 mL/hr over 60 Minutes Intravenous Every 8 hours 07/14/22 0934 07/14/22 0935   07/12/22 1800  cefTRIAXone (ROCEPHIN) 2 g in sodium chloride 0.9 % 100 mL IVPB  Status:  Discontinued        2 g 200 mL/hr over 30 Minutes Intravenous Every 24 hours 07/11/22 1759 07/12/22 0836   07/12/22 0900  piperacillin-tazobactam (ZOSYN) IVPB 3.375 g  Status:  Discontinued        3.375 g 12.5 mL/hr over 240 Minutes Intravenous Every 8 hours 07/12/22 0836 07/14/22 0934   07/11/22 1800  cefTRIAXone (ROCEPHIN) 1 g in sodium chloride 0.9 % 100 mL IVPB        1 g 200 mL/hr over 30 Minutes Intravenous  Once 07/11/22 1759 07/11/22 2300   07/11/22 1730  metroNIDAZOLE (FLAGYL) IVPB 500 mg  Status:  Discontinued        500 mg 100 mL/hr over 60 Minutes Intravenous Every 12 hours 07/11/22 1726 07/12/22 0836   07/11/22 1545  cefTRIAXone (ROCEPHIN) 1 g in sodium chloride 0.9 % 100 mL IVPB        1 g 200 mL/hr over 30 Minutes Intravenous  Once 07/11/22 1540 07/11/22 1716  Assessment/Plan:   LOS: 10 days  Diverticulitis with small abscess - afebrile, normal WBC, on steroids/MTX - 1st bout, last colonoscopy 2013 - CT scan 9/6 shows Acute sigmoid colon diverticulitis with small peridiverticular abscess/fluid collection, 1.9 cm in greatest dimension - CT 9/10 w/ similar amount of inflammatory changes around sigmoid colon and slight increase in fluid collection - 2.6cm    Repeat CT scan tomorrow.  If improved or stable, she may be a candidate for discharge on outpatient antibiotics.   -Overall looks better.  C. difficile seems to be improving.  Continue antibiotics.   Difficult to assess due to her multiple chronic medical problems.  P.o. intake is marginal, continue supplemental nutritional support for now   She has had issues in the past and may  benefit from outpatient referral to one of her colorectal surgeons.  Dr. Dema Severin is seeing her last week.   Failure to improve may warrant laparotomy, partial colectomy, end colostomy. She would be at increased risk for issues with wound healing and post-operative complications given her use of immunosuppressive therapies/steroids.  Overall she seems better and I doubt surgical intervention will be necessary but we will follow with you.   ID - rocephin/flagyl 9/6>>9/7, zosyn 9/7-9/9, ertapenem 9/9 >>; c.dif antigen positive 9/12, reviewed with ID and started fidaxomicin (10 days) FEN - SOFT, ensure, PICC/TPN  VTE - SCDs, lovenox Foley - none Total time 20 minutes moderate complexity   Klebsiella pneumoniae UTI  PMR on simponi/prednisone/MTX HTN HLD CAD Chronic back pain  Joyice Faster Thoms Barthelemy MD  07/22/2022

## 2022-07-23 ENCOUNTER — Inpatient Hospital Stay (HOSPITAL_COMMUNITY): Payer: Medicare Other

## 2022-07-23 DIAGNOSIS — K5720 Diverticulitis of large intestine with perforation and abscess without bleeding: Secondary | ICD-10-CM | POA: Diagnosis not present

## 2022-07-23 MED ORDER — SODIUM CHLORIDE (PF) 0.9 % IJ SOLN
INTRAMUSCULAR | Status: AC
  Administered 2022-07-23: 10 mL
  Filled 2022-07-23: qty 50

## 2022-07-23 MED ORDER — IOHEXOL 300 MG/ML  SOLN
100.0000 mL | Freq: Once | INTRAMUSCULAR | Status: AC | PRN
  Administered 2022-07-23: 100 mL via INTRAVENOUS

## 2022-07-23 MED ORDER — IOHEXOL 9 MG/ML PO SOLN
ORAL | Status: AC
  Administered 2022-07-23: 500 mL via ORAL
  Filled 2022-07-23: qty 1000

## 2022-07-23 MED ORDER — IOHEXOL 9 MG/ML PO SOLN
500.0000 mL | ORAL | Status: AC
  Administered 2022-07-23: 500 mL via ORAL

## 2022-07-23 NOTE — Progress Notes (Signed)
PROGRESS NOTE Jenny Glass  AJO:[id number] DOB: 1951/07/07 DOA: 07/11/2022 PCP: Merrilee Seashore, MD   Brief Narrative/Hospital Course: 71 year old female with history of polymyalgia rheumatica, carotid artery stenosis left greater than right, hepatic hemangioma, hyperlipidemia, aortic atherosclerosis, hypertension, chronic back pain presented to the ED with abdominal pain left lower quadrant and suprapubic with nausea x3 days with associated chills, possible fever at home but no vomiting or diarrhea.  Seen at PCP office and instructed to come to the ED. In the ED vitals stable, underwent lab work that showed mild leukocytosis abnormal UA, CT abdomen pelvis with acute sigmoid colon diverticulitis with small peridiverticular abscess/fluid collection 1.9 cm.  Patient was given LR 1 L, Rocephin 1 g, Flagyl, lactic acid ordered and admission requested.  Seen by surgery, antibiotic changed to Zosyn for complicated diverticulitis-being managed conservatively.  Initial Flagyl Rocephin/and switched to Zosyn > then to [facility] 07/14/22.  Remains in the hospital due to persistent abdominal pain, prolonged hospitalization.  Surgery following.   Subjective: Patient seen and examined.  Yesterday she did fairly well but today morning she started having pain and spasms.  Looks anxious today.  Denies any vomiting.  She had 5 small volume loose bowel movements overnight.  Has had the bedside.   Assessment and Plan: Principal Problem:   Abscess of sigmoid colon due to diverticulitis Active Problems:   Hyperlipidemia   Chronic back pain   Hypertension   Carotid artery stenosis   UTI (urinary tract infection)   Diverticulitis of intestine with abscess   Malnutrition of moderate degree  Acute complicated sigmoid diverticulitis with a small abscess 1.9 cm in CT> increased to 2.6. Attempting conservative management.  Recurrent pain.  CT scan today.  On maintenance IV fluids.  On different antibiotics and currently  on Invanz.  Loose stool prompted C. difficile testing, antigen positive for C. difficile but toxin is negative. Surgery recommended treatment with Dificid due to symptomatic disease though toxin is negative. Mobilize. Surgery closely following for possible need for laparotomy. As her symptoms are prolonged and not improving, she may end up having colostomy.  Moderate malnutrition:In the setting of decreased oral intake due to #1, dietitian following, advancing diet.  Off TPN.  Nutritional supplements.  Klebsiella pneumoniae UTI : Has gotten adequate coverage   PMR on simponi inj q6-8 wk,methotrexate inj q wkly, and prednisone. Patient is immunocompromised status Patient is due for injection Simponi agents but will hold off due to patient's acute infection and will need to be reassessed by her rheumatologist. Cont her prednisone  Headache:continue Tylenol prn  Hyperlipidemia Hypertension Carotid artery stenosis: She has no chest pain. BP stable.Cont to hold amlodipine and losartan. Cont crestor 10 mg,   Chronic back pain: cont pain control.   DVT prophylaxis: enoxaparin (LOVENOX) injection 40 mg Start: 07/20/22 2200 Code Status:   Code Status: Full Code Family Communication: Husband at the bedside Patient status is: Remains hospitalized for continued IV antibiotics for complicated diverticulitis, Level of care: Med-Surg  Dispo: The patient is from: home            Anticipated disposition: home  Mobility Assessment (last 72 hours)     Mobility Assessment     Row Name 07/23/22 0757 07/22/22 0734 07/21/22 0930       Does patient have an order for bedrest or is patient medically unstable No - Continue assessment No - Continue assessment No - Continue assessment     What is the highest level of mobility based on the  progressive mobility assessment? Level 6 (Walks independently in room and hall) - Balance while walking in room without assist - Complete Level 6 (Walks independently in  room and hall) - Balance while walking in room without assist - Complete Level 6 (Walks independently in room and hall) - Balance while walking in room without assist - Complete            Objective: Vitals last 24 hrs: Vitals:   07/22/22 0517 07/22/22 1430 07/22/22 2127 07/23/22 0538  BP: 105/70 108/76 101/65 115/79  Pulse: 77 82 70 78  Resp: '14 16 14 14  '$ Temp: 98.3 F (36.8 C) 98.5 F (36.9 C) 98.5 F (36.9 C) 98.4 F (36.9 C)  TempSrc: Oral Oral Oral Oral  SpO2: 92% 95% 94% 95%  Weight:      Height:       Weight change:   Physical Examination:  General: Laying in bed.  In mild distress and anxious.  On room air. Cardiovascular: S1-S2 normal.  Regular rate rhythm. Respiratory: Bilateral clear.  No added sounds. Gastrointestinal: Soft.  Guarding and tenderness on the left lateral quadrant and lower quadrant. Ext: No deformities. Neuro: Alert oriented x4.  No neurological deficits.      Medications reviewed:  Scheduled Meds:  calcium carbonate  1 tablet Oral Daily   Chlorhexidine Gluconate Cloth  6 each Topical Daily   enoxaparin (LOVENOX) injection  40 mg Subcutaneous Q24H   feeding supplement  237 mL Oral TID WC   fiber supplement (BANATROL TF)  60 mL Oral BID   fidaxomicin  200 mg Oral BID   folic acid  277 mcg Oral Daily   guaiFENesin  600 mg Oral BID   predniSONE  2 mg Oral Q breakfast   rosuvastatin  10 mg Oral Daily   sodium chloride (PF)       sodium chloride flush  10-40 mL Intracatheter Q12H  Continuous Infusions:  sodium chloride Stopped (07/17/22 0844)   ertapenem 1,000 mg (07/23/22 0829)    Diet Order             DIET SOFT Room service appropriate? Yes; Fluid consistency: Thin  Diet effective now                 No intake or output data in the 24 hours ending 07/23/22 1132  Net IO Since Admission: 8,171.77 mL [07/23/22 1132]  Wt Readings from Last 3 Encounters:  07/16/22 72.3 kg  06/20/22 74.4 kg  05/01/22 75.4 kg     Unresulted  Labs (From admission, onward)    None     Data Reviewed: I have personally reviewed following labs and imaging studies CBC: Recent Labs  Lab 07/17/22 0550 07/18/22 0655 07/22/22 0900  WBC 6.8 7.3 8.2  HGB 10.6* 10.6* 12.9  HCT 32.9* 32.9* 40.9  MCV 91.9 92.4 93.4  PLT 287 287 412   Basic Metabolic Panel: Recent Labs  Lab 07/17/22 0550 07/18/22 0655 07/18/22 0940 07/19/22 0600  NA 142 144  --  141  K 3.1* 3.5  --  4.0  CL 112* 109  --  106  CO2 22 30  --  31  GLUCOSE 121* 107*  --  92  BUN 6* 13  --  17  CREATININE 0.67 0.75  --  0.69  CALCIUM 8.5* 8.4*  --  8.9  MG 2.0  --  2.1 2.2  PHOS 2.3*  --  4.8* 3.6   GFR: Estimated Creatinine Clearance: 63.3 mL/min (by  C-G formula based on SCr of 0.69 mg/dL). Liver Function Tests: Recent Labs  Lab 07/17/22 0550 07/19/22 0600  AST 17 21  ALT 15 17  ALKPHOS 54 53  BILITOT 0.6 0.4  PROT 6.3* 6.3*  ALBUMIN 3.1* 3.3*    Recent Results (from the past 240 hour(s))  C Difficile Quick Screen (NO PCR Reflex)     Status: Abnormal   Collection Time: 07/17/22 12:52 PM   Specimen: STOOL  Result Value Ref Range Status   C Diff antigen POSITIVE (A) NEGATIVE Final   C Diff toxin NEGATIVE NEGATIVE Final   C Diff interpretation   Final    Results are indeterminate. Please contact the provider listed for your campus for C diff questions in [place].    Comment: Performed at [facility], [place] [address] [place], [place] [address]    Antimicrobials: Anti-infectives (From admission, onward)    Start     Dose/Rate Route Frequency Ordered Stop   07/17/22 1530  fidaxomicin (DIFICID) tablet 200 mg        200 mg Oral 2 times daily 07/17/22 1431 07/27/22 0959   07/14/22 1100  ertapenem [facility]) injection 1,000 mg  Status:  Discontinued        1 g Intramuscular Daily 07/14/22 0935 07/14/22 1000   07/14/22 1100  ertapenem (INVANZ) 1,000 mg in sodium chloride 0.9 % 100 mL IVPB        1 g 200 mL/hr over 30 Minutes  Intravenous Daily 07/14/22 1002     07/14/22 1030  ciprofloxacin (CIPRO) IVPB 400 mg  Status:  Discontinued        400 mg 200 mL/hr over 60 Minutes Intravenous Every 12 hours 07/14/22 0934 07/14/22 0935   07/14/22 1030  metroNIDAZOLE (FLAGYL) IVPB 500 mg  Status:  Discontinued        500 mg 100 mL/hr over 60 Minutes Intravenous Every 8 hours 07/14/22 0934 07/14/22 0935   07/12/22 1800  cefTRIAXone (ROCEPHIN) 2 g in sodium chloride 0.9 % 100 mL IVPB  Status:  Discontinued        2 g 200 mL/hr over 30 Minutes Intravenous Every 24 hours 07/11/22 1759 07/12/22 0836   07/12/22 0900  piperacillin-tazobactam (ZOSYN) IVPB 3.375 g  Status:  Discontinued        3.375 g 12.5 mL/hr over 240 Minutes Intravenous Every 8 hours 07/12/22 0836 07/14/22 0934   07/11/22 1800  cefTRIAXone (ROCEPHIN) 1 g in sodium chloride 0.9 % 100 mL IVPB        1 g 200 mL/hr over 30 Minutes Intravenous  Once 07/11/22 1759 07/11/22 2300   07/11/22 1730  metroNIDAZOLE (FLAGYL) IVPB 500 mg  Status:  Discontinued        500 mg 100 mL/hr over 60 Minutes Intravenous Every 12 hours 07/11/22 1726 07/12/22 0836   07/11/22 1545  cefTRIAXone (ROCEPHIN) 1 g in sodium chloride 0.9 % 100 mL IVPB        1 g 200 mL/hr over 30 Minutes Intravenous  Once 07/11/22 1540 07/11/22 1716      Culture/Microbiology    Component Value Date/Time   SDES  07/11/2022 1446    URINE, CLEAN CATCH Performed at [facility], [place] [address] [place], [place] [address]    SPECREQUEST  07/11/2022 1446    NONE Performed at [facility], [place] [address] [place], [place] [address]    CULT >=100,000 COLONIES/mL KLEBSIELLA PNEUMONIAE (A) 07/11/2022 1446   REPTSTATUS 07/13/2022  FINAL 07/11/2022 1446   Radiology Studies: CT ABDOMEN PELVIS W CONTRAST  Result Date: 07/23/2022 CLINICAL DATA:  Abdominal pain, postop. Diverticulitis with abscess. EXAM: CT ABDOMEN AND PELVIS WITH CONTRAST TECHNIQUE: Multidetector CT imaging  of the abdomen and pelvis was performed using the standard protocol following bolus administration of intravenous contrast. RADIATION DOSE REDUCTION: This exam was performed according to the departmental dose-optimization program which includes automated exposure control, adjustment of the mA and/or kV according to patient size and/or use of iterative reconstruction technique. CONTRAST:  171m OMNIPAQUE IOHEXOL 300 MG/ML  SOLN COMPARISON:  07/15/2022 FINDINGS: Lower chest: Lung bases are clear. Hepatobiliary: Benign hemangioma within the left lobe of the liver is unchanged. Cyst in the medial right lower lobe is unchanged. No calcified gallstones. Pancreas: Normal Spleen: Normal Adrenals/Urinary Tract: Adrenal glands are normal. No acute renal finding. Chronic atrophy of the right kidney. Mild fullness of the left renal collecting system and ureter. No evidence of stone disease. Bladder is normal. Stomach/Bowel: Stomach and small intestine are normal. Normal appendix. There is less thickening of the wall of the sigmoid colon. There is less edema in the adjacent fat. Small abscess in the sigmoid mesentery is improving, measuring only 1 x 1.5 cm today compared with 1.5 x 2.5 cm previously. No worsening or new finding. Vascular/Lymphatic: Aortic atherosclerosis. IVC is normal. No adenopathy. Reproductive: Negative Other: No free fluid or air. Musculoskeletal: Ordinary degenerative changes affect the lumbar spine. IMPRESSION: Imaging improvement. Less inflammatory change of the sigmoid colon itself. Less edema in the adjacent fat. Decreasing size of the small fluid collection in the sigmoid mesentery, 1 x 1.5 cm today compared with 1.5 x 2.5 cm previously. Electronically Signed   By: MNelson ChimesM.D.   On: 07/23/2022 11:09     LOS: 11 days   KBarb Merino MD [facility]  07/23/2022, 11:32 AM

## 2022-07-23 NOTE — Care Management Important Message (Signed)
Important Message  Patient Details IM Letter given to the Patient. Name: Jenny Glass MRN: [id number] Date of Birth: 04-08-1951   Medicare Important Message Given:  Yes     [name], [name] 07/23/2022, 9:24 AM

## 2022-07-23 NOTE — Progress Notes (Signed)
Calorie Count Note  48 hour calorie count ordered. Envelope not on door anymore. No documentation via meal tickets. Calculated from what is in flowsheets and MAR.  Diet: Soft Supplements: Ensure Plus High Protein po BID, each supplement provides 350 kcal and 20 grams of protein.  Banatrol fiber supplement BID, each provides 45 kcals, 2g protein and 5g soluble fiber   9/15 and 9/16: Per RN report: pt states food doesn't taste right Breakfast: minimal Lunch: minimal Dinner: minimal Supplements: Banatrol x 3 =135 kcals, 6g protein Refused Ensure both days.   Total intake x 48 hours: 135 kcal ,6g protein   Nutrition Dx: Moderate Malnutrition related to acute illness as evidenced by mild fat depletion, moderate muscle depletion, energy intake < 75% for > 7 days.  Goal: Pt to meet >/= 90% of their estimated nutrition needs   Intervention:  -Continue to encourage PO intakes and supplements -Calorie Count not providing any meaningful information -will d/c -Recommend TPN resumption if unable to consume more  Clayton Bibles, MS, RD, LDN Inpatient Clinical Dietitian Contact information available via Amion

## 2022-07-23 NOTE — TOC Progression Note (Signed)
Transition of Care [facility]) - Progression Note    Patient Details  Name: NAEEMA PATLAN MRN: [id number] Date of Birth: Nov 22, 1950  Transition of [facility] CM/SW Contact  Leeroy Cha, RN Phone Number: 07/23/2022, 7:25 AM  Clinical Narrative:    [phone] chart reviewed.  Following for toc needs.  Plan is to return home with self-care currently.   Expected Discharge Plan: Home/Self Care Barriers to Discharge: Continued Medical Work up  Expected Discharge Plan and Services Expected Discharge Plan: Home/Self Care   Discharge Planning Services: CM Consult   Living arrangements for the past 2 months: Single Family Home                                       Social Determinants of Health (SDOH) Interventions Utilities Interventions: Intervention Not Indicated  Readmission Risk Interventions   No data to display

## 2022-07-23 NOTE — Progress Notes (Signed)
Subjective/Chief Complaint: States she did a little better yesterday but had a rough night due to LLQ pain and small bouts of diarrhea. Denies nausea or vomiting. Feels like the LLQ pain "started all over". Her husband is at bedside.    Objective: Vital signs in last 24 hours: Temp:  [98.4 F (36.9 C)-98.5 F (36.9 C)] 98.4 F (36.9 C) (09/18 0538) Pulse Rate:  [70-82] 78 (09/18 0538) Resp:  [14-16] 14 (09/18 0538) BP: (101-115)/(65-79) 115/79 (09/18 0538) SpO2:  [94 %-95 %] 95 % (09/18 0538) Last BM Date : 07/21/22  Intake/Output from previous day: 09/17 0701 - 09/18 0700 In: 120 [P.O.:120] Out: -  Intake/Output this shift: No intake/output data recorded.   Gen:  Alert, NAD, pleasant Looks overall improved Cardio: RRR Pulm: CTAB, no wheezing, rate and effort normal  Abd: soft, ND, TTP in LLQ with voluntary guarding No rebound Lab Results:  Recent Labs    07/22/22 0900  WBC 8.2  HGB 12.9  HCT 40.9  PLT 319   BMET No results for input(s): "NA", "K", "CL", "CO2", "GLUCOSE", "BUN", "CREATININE", "CALCIUM" in the last 72 hours.  PT/INR No results for input(s): "LABPROT", "INR" in the last 72 hours. ABG No results for input(s): "PHART", "HCO3" in the last 72 hours.  Invalid input(s): "PCO2", "PO2"  Studies/Results: No results found.  Anti-infectives: Anti-infectives (From admission, onward)    Start     Dose/Rate Route Frequency Ordered Stop   07/17/22 1530  fidaxomicin (DIFICID) tablet 200 mg        200 mg Oral 2 times daily 07/17/22 1431 07/27/22 0959   07/14/22 1100  ertapenem [facility]) injection 1,000 mg  Status:  Discontinued        1 g Intramuscular Daily 07/14/22 0935 07/14/22 1000   07/14/22 1100  ertapenem (INVANZ) 1,000 mg in sodium chloride 0.9 % 100 mL IVPB        1 g 200 mL/hr over 30 Minutes Intravenous Daily 07/14/22 1002     07/14/22 1030  ciprofloxacin (CIPRO) IVPB 400 mg  Status:  Discontinued        400 mg 200 mL/hr over 60 Minutes  Intravenous Every 12 hours 07/14/22 0934 07/14/22 0935   07/14/22 1030  metroNIDAZOLE (FLAGYL) IVPB 500 mg  Status:  Discontinued        500 mg 100 mL/hr over 60 Minutes Intravenous Every 8 hours 07/14/22 0934 07/14/22 0935   07/12/22 1800  cefTRIAXone (ROCEPHIN) 2 g in sodium chloride 0.9 % 100 mL IVPB  Status:  Discontinued        2 g 200 mL/hr over 30 Minutes Intravenous Every 24 hours 07/11/22 1759 07/12/22 0836   07/12/22 0900  piperacillin-tazobactam (ZOSYN) IVPB 3.375 g  Status:  Discontinued        3.375 g 12.5 mL/hr over 240 Minutes Intravenous Every 8 hours 07/12/22 0836 07/14/22 0934   07/11/22 1800  cefTRIAXone (ROCEPHIN) 1 g in sodium chloride 0.9 % 100 mL IVPB        1 g 200 mL/hr over 30 Minutes Intravenous  Once 07/11/22 1759 07/11/22 2300   07/11/22 1730  metroNIDAZOLE (FLAGYL) IVPB 500 mg  Status:  Discontinued        500 mg 100 mL/hr over 60 Minutes Intravenous Every 12 hours 07/11/22 1726 07/12/22 0836   07/11/22 1545  cefTRIAXone (ROCEPHIN) 1 g in sodium chloride 0.9 % 100 mL IVPB        1 g 200 mL/hr over 30 Minutes Intravenous  Once 07/11/22 1540 07/11/22 1716       Assessment/Plan:     Diverticulitis with small abscess - afebrile, normal WBC, on steroids/MTX - 1st bout, last colonoscopy 2013 - CT scan 9/6 shows Acute sigmoid colon diverticulitis with small peridiverticular abscess/fluid collection, 1.9 cm in greatest dimension - CT 9/10 w/ similar amount of inflammatory changes around sigmoid colon and slight increase in fluid collection - 2.6cm  -Overall looks better - though reports increased pain and is guarding on exam today. C. difficile seems to be improving.  Continue antibiotics. CT abd pelvis today. PO intake still marginal - encourage ensure TID +recommended supplements.  She has had issues in the past and may benefit from outpatient referral to one of her colorectal surgeons.  Dr. Dema Severin is seeing her last week.  Failure to improve may warrant  laparotomy, partial colectomy, end colostomy. She would be at increased risk for issues with wound healing and post-operative complications given her use of immunosuppressive therapies/steroids.  Overall she seems better and I doubt surgical intervention will be necessary but we will follow with you.   ID - rocephin/flagyl 9/6>>9/7, zosyn 9/7-9/9, ertapenem 9/9 >>; c.dif antigen positive 9/12, reviewed with ID and started fidaxomicin (10 days) FEN - SOFT, ensure VTE - SCDs, lovenox Foley - none  Klebsiella pneumoniae UTI  PMR on simponi/prednisone/MTX HTN HLD CAD Chronic back pain Jill Alexanders  MD 07/23/2022

## 2022-07-24 DIAGNOSIS — K5720 Diverticulitis of large intestine with perforation and abscess without bleeding: Secondary | ICD-10-CM | POA: Diagnosis not present

## 2022-07-24 LAB — COMPREHENSIVE METABOLIC PANEL
ALT: 19 U/L (ref 0–44)
AST: 18 U/L (ref 15–41)
Albumin: 3.2 g/dL — ABNORMAL LOW (ref 3.5–5.0)
Alkaline Phosphatase: 57 U/L (ref 38–126)
Anion gap: 7 (ref 5–15)
BUN: 17 mg/dL (ref 8–23)
CO2: 27 mmol/L (ref 22–32)
Calcium: 8.9 mg/dL (ref 8.9–10.3)
Chloride: 107 mmol/L (ref 98–111)
Creatinine, Ser: 0.96 mg/dL (ref 0.44–1.00)
GFR, Estimated: >60 mL/min (ref >60)
Glucose, Bld: 89 mg/dL (ref 70–99)
Potassium: 4 mmol/L (ref 3.5–5.1)
Sodium: 141 mmol/L (ref 135–145)
Total Bilirubin: 0.7 mg/dL (ref 0.3–1.2)
Total Protein: 6.4 g/dL — ABNORMAL LOW (ref 6.5–8.1)

## 2022-07-24 LAB — CBC WITH DIFFERENTIAL/PLATELET
Abs Immature Granulocytes: 0.04 10*3/uL (ref 0.00–0.07)
Basophils Absolute: 0 10*3/uL (ref 0.0–0.1)
Basophils Relative: 1 %
Eosinophils Absolute: 0.3 10*3/uL (ref 0.0–0.5)
Eosinophils Relative: 3 %
HCT: 36.6 % (ref 36.0–46.0)
Hemoglobin: 11.7 g/dL — ABNORMAL LOW (ref 12.0–15.0)
Immature Granulocytes: 1 %
Lymphocytes Relative: 19 %
Lymphs Abs: 1.6 10*3/uL (ref 0.7–4.0)
MCH: 29.7 pg (ref 26.0–34.0)
MCHC: 32 g/dL (ref 30.0–36.0)
MCV: 92.9 fL (ref 80.0–100.0)
Monocytes Absolute: 1.2 10*3/uL — ABNORMAL HIGH (ref 0.1–1.0)
Monocytes Relative: 13 %
Neutro Abs: 5.6 10*3/uL (ref 1.7–7.7)
Neutrophils Relative %: 63 %
Platelets: 275 10*3/uL (ref 150–400)
RBC: 3.94 MIL/uL (ref 3.87–5.11)
RDW: 14.9 % (ref 11.5–15.5)
WBC: 8.8 10*3/uL (ref 4.0–10.5)
nRBC: 0 % (ref 0.0–0.2)

## 2022-07-24 LAB — MAGNESIUM: Magnesium: 2.3 mg/dL (ref 1.7–2.4)

## 2022-07-24 LAB — PHOSPHORUS: Phosphorus: 3.6 mg/dL (ref 2.5–4.6)

## 2022-07-24 NOTE — Progress Notes (Signed)
Mobility Specialist - Progress Note   07/24/22 1616  Mobility  HOB Elevated/Bed Position Self regulated  Activity Ambulated independently in hallway  Range of Motion/Exercises Active  Level of Assistance Independent  Assistive Device None  Distance Ambulated (ft) 500 ft  Activity Response Tolerated well  Transport method Ambulatory  $Mobility charge 1 Mobility   Pt received in bed and agreeable to mobility.No complaints during ambulation. Pt to bed at EOS with all necessities in reach.   [facility]

## 2022-07-24 NOTE — Progress Notes (Signed)
PROGRESS NOTE Jenny Glass  DVV:[id number] DOB: 13-Nov-1950 DOA: 07/11/2022 PCP: Merrilee Seashore, MD   Brief Narrative/Hospital Course: 71 year old female with history of polymyalgia rheumatica, carotid artery stenosis left greater than right, hepatic hemangioma, hyperlipidemia, aortic atherosclerosis, hypertension, chronic back pain presented to the ED with abdominal pain left lower quadrant and suprapubic with nausea x3 days with associated chills, possible fever at home but no vomiting or diarrhea.  Seen at PCP office and instructed to come to the ED. In the ED vitals stable, underwent lab work that showed mild leukocytosis abnormal UA, CT abdomen pelvis with acute sigmoid colon diverticulitis with small peridiverticular abscess/fluid collection 1.9 cm.  Patient was given LR 1 L, Rocephin 1 g, Flagyl, lactic acid ordered and admission requested.  Seen by surgery, antibiotic changed to Zosyn for complicated diverticulitis-being managed conservatively.  Initial Flagyl Rocephin/and switched to Zosyn > then to [facility] 07/14/22.  Remains in the hospital due to persistent abdominal pain, prolonged hospitalization.  Surgery following.   Subjective:  Patient seen and examined.  Mild to moderate left lower quadrant pain persist.  She had 3 loose bowel movement overnight.  She is soiled her bed early morning today and was upset. Wants to see if we can liberalize her diet and she can better eat.  Assessment and Plan: Principal Problem:   Abscess of sigmoid colon due to diverticulitis Active Problems:   Hyperlipidemia   Chronic back pain   Hypertension   Carotid artery stenosis   UTI (urinary tract infection)   Diverticulitis of intestine with abscess   Malnutrition of moderate degree  Acute complicated sigmoid diverticulitis with a small abscess 1.9 cm in CT> increased to 2.6. Attempting conservative management.  Repeat CT scans with radiological improvement.  On maintenance IV fluids.  On  different antibiotics and currently on Invanz.  Loose stool prompted C. difficile testing, antigen positive for C. difficile but toxin is negative. Surgery recommended treatment with Dificid due to symptomatic disease though toxin is negative.  Day 8/10. Mobility. Anticipate home on oral antibiotics and outpatient follow-up with interval colectomy if able to eat.  Moderate malnutrition:In the setting of decreased oral intake due to #1, dietitian following, advancing diet.  Off TPN.  Nutritional supplements.  Klebsiella pneumoniae UTI : Has gotten adequate coverage   PMR on simponi inj q6-8 wk,methotrexate inj q wkly, and prednisone. Patient is immunocompromised status Patient is due for injection Simponi agents but will hold off due to patient's acute infection and will need to be reassessed by her rheumatologist. Cont her prednisone  Headache:continue Tylenol prn  Hyperlipidemia Hypertension Carotid artery stenosis: She has no chest pain. BP stable.Cont to hold amlodipine and losartan. Cont crestor 10 mg,   Chronic back pain: cont pain control.   DVT prophylaxis: enoxaparin (LOVENOX) injection 40 mg Start: 07/20/22 2200 Code Status:   Code Status: Full Code Family Communication: Husband at the bedside Patient status is: Remains hospitalized for continued IV antibiotics for complicated diverticulitis, Level of care: Med-Surg  Dispo: The patient is from: home            Anticipated disposition: home  Mobility Assessment (last 72 hours)     Mobility Assessment     Row Name 07/24/22 0736 07/23/22 0757 07/22/22 0734       Does patient have an order for bedrest or is patient medically unstable No - Continue assessment No - Continue assessment No - Continue assessment     What is the highest level of mobility based on  the progressive mobility assessment? Level 6 (Walks independently in room and hall) - Balance while walking in room without assist - Complete Level 6 (Walks independently  in room and hall) - Balance while walking in room without assist - Complete Level 6 (Walks independently in room and hall) - Balance while walking in room without assist - Complete            Objective: Vitals last 24 hrs: Vitals:   07/23/22 0538 07/23/22 1305 07/23/22 2026 07/24/22 0629  BP: 115/79 118/72 111/66 130/74  Pulse: 78 81 73 72  Resp: '14 16 18 18  '$ Temp: 98.4 F (36.9 C) 98.1 F (36.7 C) 98 F (36.7 C) 98.3 F (36.8 C)  TempSrc: Oral Oral Oral Oral  SpO2: 95% 92% 93% 92%  Weight:      Height:       Weight change:   Physical Examination:  General: Slightly anxious.  But looks comfortable.  Sitting in chair.  Not in any distress.  On room air. Cardiovascular: S1 and S2 normal.  Regular rate rhythm. Respiratory: Bilateral clear.  No added sounds. Gastrointestinal:.  Mild tenderness left lateral quadrant.  Soft.  Bowel sound present. Ext: No edema.  No cyanosis.  No deformities. Neuro: Alert oriented x4.  No focal deficits.    Medications reviewed:  Scheduled Meds:  calcium carbonate  1 tablet Oral Daily   Chlorhexidine Gluconate Cloth  6 each Topical Daily   enoxaparin (LOVENOX) injection  40 mg Subcutaneous Q24H   feeding supplement  237 mL Oral TID WC   fiber supplement (BANATROL TF)  60 mL Oral BID   fidaxomicin  200 mg Oral BID   folic acid  938 mcg Oral Daily   guaiFENesin  600 mg Oral BID   predniSONE  2 mg Oral Q breakfast   rosuvastatin  10 mg Oral Daily   sodium chloride flush  10-40 mL Intracatheter Q12H  Continuous Infusions:  sodium chloride Stopped (07/17/22 0844)   ertapenem 1,000 mg (07/24/22 0925)    Diet Order             Diet regular Room service appropriate? Yes; Fluid consistency: Thin  Diet effective now                  Intake/Output Summary (Last 24 hours) at 07/24/2022 1120 Last data filed at 07/23/2022 1500 Gross per 24 hour  Intake 240 ml  Output --  Net 240 ml    Net IO Since Admission: 8,411.77 mL [07/24/22  1120]  Wt Readings from Last 3 Encounters:  07/16/22 72.3 kg  06/20/22 74.4 kg  05/01/22 75.4 kg     Unresulted Labs (From admission, onward)    None     Data Reviewed: I have personally reviewed following labs and imaging studies CBC: Recent Labs  Lab 07/18/22 0655 07/22/22 0900 07/24/22 0609  WBC 7.3 8.2 8.8  NEUTROABS  --   --  5.6  HGB 10.6* 12.9 11.7*  HCT 32.9* 40.9 36.6  MCV 92.4 93.4 92.9  PLT 287 319 101   Basic Metabolic Panel: Recent Labs  Lab 07/18/22 0655 07/18/22 0940 07/19/22 0600 07/24/22 0609  NA 144  --  141 141  K 3.5  --  4.0 4.0  CL 109  --  106 107  CO2 30  --  31 27  GLUCOSE 107*  --  92 89  BUN 13  --  17 17  CREATININE 0.75  --  0.69 0.96  CALCIUM 8.4*  --  8.9 8.9  MG  --  2.1 2.2 2.3  PHOS  --  4.8* 3.6 3.6   GFR: Estimated Creatinine Clearance: 52.7 mL/min (by C-G formula based on SCr of 0.96 mg/dL). Liver Function Tests: Recent Labs  Lab 07/19/22 0600 07/24/22 0609  AST 21 18  ALT 17 19  ALKPHOS 53 57  BILITOT 0.4 0.7  PROT 6.3* 6.4*  ALBUMIN 3.3* 3.2*    Recent Results (from the past 240 hour(s))  C Difficile Quick Screen (NO PCR Reflex)     Status: Abnormal   Collection Time: 07/17/22 12:52 PM   Specimen: STOOL  Result Value Ref Range Status   C Diff antigen POSITIVE (A) NEGATIVE Final   C Diff toxin NEGATIVE NEGATIVE Final   C Diff interpretation   Final    Results are indeterminate. Please contact the provider listed for your campus for C diff questions in [place].    Comment: Performed at [facility], [place] [address], [place], [place] [address]    Antimicrobials: Anti-infectives (From admission, onward)    Start     Dose/Rate Route Frequency Ordered Stop   07/17/22 1530  fidaxomicin (DIFICID) tablet 200 mg        200 mg Oral 2 times daily 07/17/22 1431 07/27/22 0959   07/14/22 1100  ertapenem [facility]) injection 1,000 mg  Status:  Discontinued        1 g Intramuscular Daily 07/14/22 0935  07/14/22 1000   07/14/22 1100  ertapenem (INVANZ) 1,000 mg in sodium chloride 0.9 % 100 mL IVPB        1 g 200 mL/hr over 30 Minutes Intravenous Daily 07/14/22 1002     07/14/22 1030  ciprofloxacin (CIPRO) IVPB 400 mg  Status:  Discontinued        400 mg 200 mL/hr over 60 Minutes Intravenous Every 12 hours 07/14/22 0934 07/14/22 0935   07/14/22 1030  metroNIDAZOLE (FLAGYL) IVPB 500 mg  Status:  Discontinued        500 mg 100 mL/hr over 60 Minutes Intravenous Every 8 hours 07/14/22 0934 07/14/22 0935   07/12/22 1800  cefTRIAXone (ROCEPHIN) 2 g in sodium chloride 0.9 % 100 mL IVPB  Status:  Discontinued        2 g 200 mL/hr over 30 Minutes Intravenous Every 24 hours 07/11/22 1759 07/12/22 0836   07/12/22 0900  piperacillin-tazobactam (ZOSYN) IVPB 3.375 g  Status:  Discontinued        3.375 g 12.5 mL/hr over 240 Minutes Intravenous Every 8 hours 07/12/22 0836 07/14/22 0934   07/11/22 1800  cefTRIAXone (ROCEPHIN) 1 g in sodium chloride 0.9 % 100 mL IVPB        1 g 200 mL/hr over 30 Minutes Intravenous  Once 07/11/22 1759 07/11/22 2300   07/11/22 1730  metroNIDAZOLE (FLAGYL) IVPB 500 mg  Status:  Discontinued        500 mg 100 mL/hr over 60 Minutes Intravenous Every 12 hours 07/11/22 1726 07/12/22 0836   07/11/22 1545  cefTRIAXone (ROCEPHIN) 1 g in sodium chloride 0.9 % 100 mL IVPB        1 g 200 mL/hr over 30 Minutes Intravenous  Once 07/11/22 1540 07/11/22 1716      Culture/Microbiology    Component Value Date/Time   SDES  07/11/2022 1446    URINE, CLEAN CATCH Performed at [facility], [place] [address] [place], [place] [address]    SPECREQUEST  07/11/2022 1446  NONE Performed at [facility], [place] [address], [place], [place] [address]    CULT >=100,000 COLONIES/mL KLEBSIELLA PNEUMONIAE (A) 07/11/2022 1446   REPTSTATUS 07/13/2022 FINAL 07/11/2022 1446   Radiology Studies: CT ABDOMEN PELVIS W CONTRAST  Result Date: 07/23/2022 CLINICAL  DATA:  Abdominal pain, postop. Diverticulitis with abscess. EXAM: CT ABDOMEN AND PELVIS WITH CONTRAST TECHNIQUE: Multidetector CT imaging of the abdomen and pelvis was performed using the standard protocol following bolus administration of intravenous contrast. RADIATION DOSE REDUCTION: This exam was performed according to the departmental dose-optimization program which includes automated exposure control, adjustment of the mA and/or kV according to patient size and/or use of iterative reconstruction technique. CONTRAST:  123m OMNIPAQUE IOHEXOL 300 MG/ML  SOLN COMPARISON:  07/15/2022 FINDINGS: Lower chest: Lung bases are clear. Hepatobiliary: Benign hemangioma within the left lobe of the liver is unchanged. Cyst in the medial right lower lobe is unchanged. No calcified gallstones. Pancreas: Normal Spleen: Normal Adrenals/Urinary Tract: Adrenal glands are normal. No acute renal finding. Chronic atrophy of the right kidney. Mild fullness of the left renal collecting system and ureter. No evidence of stone disease. Bladder is normal. Stomach/Bowel: Stomach and small intestine are normal. Normal appendix. There is less thickening of the wall of the sigmoid colon. There is less edema in the adjacent fat. Small abscess in the sigmoid mesentery is improving, measuring only 1 x 1.5 cm today compared with 1.5 x 2.5 cm previously. No worsening or new finding. Vascular/Lymphatic: Aortic atherosclerosis. IVC is normal. No adenopathy. Reproductive: Negative Other: No free fluid or air. Musculoskeletal: Ordinary degenerative changes affect the lumbar spine. IMPRESSION: Imaging improvement. Less inflammatory change of the sigmoid colon itself. Less edema in the adjacent fat. Decreasing size of the small fluid collection in the sigmoid mesentery, 1 x 1.5 cm today compared with 1.5 x 2.5 cm previously. Electronically Signed   By: MNelson ChimesM.D.   On: 07/23/2022 11:09     LOS: 12 days   KBarb Merino MD [facility]  07/24/2022, 11:20 AM

## 2022-07-24 NOTE — Progress Notes (Addendum)
Subjective/Chief Complaint: States she feels better today, was able to rest overnight without getting up to go to the bathroom. Reports 2 BMs yesterday and 3 so far this AM. Reports ongoing poor oral intake. Describes her abdominal pain as soreness in her left side.   Objective: Vital signs in last 24 hours: Temp:  [98 F (36.7 C)-98.3 F (36.8 C)] 98.3 F (36.8 C) (09/19 0629) Pulse Rate:  [72-81] 72 (09/19 0629) Resp:  [16-18] 18 (09/19 0629) BP: (111-130)/(66-74) 130/74 (09/19 0629) SpO2:  [92 %-93 %] 92 % (09/19 0629) Last BM Date : 07/21/22  Intake/Output from previous day: 09/18 0701 - 09/19 0700 In: 240 [P.O.:240] Out: -  Intake/Output this shift: No intake/output data recorded.   Gen:  Alert, NAD, pleasant Looks overall improved Cardio: RRR Pulm: CTAB, no wheezing, rate and effort normal  Abd: soft, ND, mild TTP in LLQ without peritonitis, no guarding   Lab Results:  Recent Labs    07/22/22 0900 07/24/22 0609  WBC 8.2 8.8  HGB 12.9 11.7*  HCT 40.9 36.6  PLT 319 275   BMET Recent Labs    07/24/22 0609  NA 141  K 4.0  CL 107  CO2 27  GLUCOSE 89  BUN 17  CREATININE 0.96  CALCIUM 8.9    PT/INR No results for input(s): "LABPROT", "INR" in the last 72 hours. ABG No results for input(s): "PHART", "HCO3" in the last 72 hours.  Invalid input(s): "PCO2", "PO2"  Studies/Results: CT ABDOMEN PELVIS W CONTRAST  Result Date: 07/23/2022 CLINICAL DATA:  Abdominal pain, postop. Diverticulitis with abscess. EXAM: CT ABDOMEN AND PELVIS WITH CONTRAST TECHNIQUE: Multidetector CT imaging of the abdomen and pelvis was performed using the standard protocol following bolus administration of intravenous contrast. RADIATION DOSE REDUCTION: This exam was performed according to the departmental dose-optimization program which includes automated exposure control, adjustment of the mA and/or kV according to patient size and/or use of iterative reconstruction technique.  CONTRAST:  178m OMNIPAQUE IOHEXOL 300 MG/ML  SOLN COMPARISON:  07/15/2022 FINDINGS: Lower chest: Lung bases are clear. Hepatobiliary: Benign hemangioma within the left lobe of the liver is unchanged. Cyst in the medial right lower lobe is unchanged. No calcified gallstones. Pancreas: Normal Spleen: Normal Adrenals/Urinary Tract: Adrenal glands are normal. No acute renal finding. Chronic atrophy of the right kidney. Mild fullness of the left renal collecting system and ureter. No evidence of stone disease. Bladder is normal. Stomach/Bowel: Stomach and small intestine are normal. Normal appendix. There is less thickening of the wall of the sigmoid colon. There is less edema in the adjacent fat. Small abscess in the sigmoid mesentery is improving, measuring only 1 x 1.5 cm today compared with 1.5 x 2.5 cm previously. No worsening or new finding. Vascular/Lymphatic: Aortic atherosclerosis. IVC is normal. No adenopathy. Reproductive: Negative Other: No free fluid or air. Musculoskeletal: Ordinary degenerative changes affect the lumbar spine. IMPRESSION: Imaging improvement. Less inflammatory change of the sigmoid colon itself. Less edema in the adjacent fat. Decreasing size of the small fluid collection in the sigmoid mesentery, 1 x 1.5 cm today compared with 1.5 x 2.5 cm previously. Electronically Signed   By: MNelson ChimesM.D.   On: 07/23/2022 11:09    Anti-infectives: Anti-infectives (From admission, onward)    Start     Dose/Rate Route Frequency Ordered Stop   07/17/22 1530  fidaxomicin (DIFICID) tablet 200 mg        200 mg Oral 2 times daily 07/17/22 1431 07/27/22 0959  07/14/22 1100  ertapenem [facility]) injection 1,000 mg  Status:  Discontinued        1 g Intramuscular Daily 07/14/22 0935 07/14/22 1000   07/14/22 1100  ertapenem (INVANZ) 1,000 mg in sodium chloride 0.9 % 100 mL IVPB        1 g 200 mL/hr over 30 Minutes Intravenous Daily 07/14/22 1002     07/14/22 1030  ciprofloxacin (CIPRO) IVPB 400  mg  Status:  Discontinued        400 mg 200 mL/hr over 60 Minutes Intravenous Every 12 hours 07/14/22 0934 07/14/22 0935   07/14/22 1030  metroNIDAZOLE (FLAGYL) IVPB 500 mg  Status:  Discontinued        500 mg 100 mL/hr over 60 Minutes Intravenous Every 8 hours 07/14/22 0934 07/14/22 0935   07/12/22 1800  cefTRIAXone (ROCEPHIN) 2 g in sodium chloride 0.9 % 100 mL IVPB  Status:  Discontinued        2 g 200 mL/hr over 30 Minutes Intravenous Every 24 hours 07/11/22 1759 07/12/22 0836   07/12/22 0900  piperacillin-tazobactam (ZOSYN) IVPB 3.375 g  Status:  Discontinued        3.375 g 12.5 mL/hr over 240 Minutes Intravenous Every 8 hours 07/12/22 0836 07/14/22 0934   07/11/22 1800  cefTRIAXone (ROCEPHIN) 1 g in sodium chloride 0.9 % 100 mL IVPB        1 g 200 mL/hr over 30 Minutes Intravenous  Once 07/11/22 1759 07/11/22 2300   07/11/22 1730  metroNIDAZOLE (FLAGYL) IVPB 500 mg  Status:  Discontinued        500 mg 100 mL/hr over 60 Minutes Intravenous Every 12 hours 07/11/22 1726 07/12/22 0836   07/11/22 1545  cefTRIAXone (ROCEPHIN) 1 g in sodium chloride 0.9 % 100 mL IVPB        1 g 200 mL/hr over 30 Minutes Intravenous  Once 07/11/22 1540 07/11/22 1716       Assessment/Plan:     Diverticulitis with small abscess - afebrile, normal WBC, on steroids/MTX - 1st bout, last colonoscopy 2013 - CT scan 9/6 shows Acute sigmoid colon diverticulitis with small peridiverticular abscess/fluid collection, 1.9 cm in greatest dimension - CT 9/10 w/ similar amount of inflammatory changes around sigmoid colon and slight increase in fluid collection - 2.6cm  -Overall looks better, showing slow clinical improvement. CT yesterday 9/18 with decreased pericolonic inflammation and interval decrease in size of abscess. Continue antibiotics. PO intake still marginal - encourage ensure TID +recommended supplements.  No emergent surgical needs. Will discuss with attending MD -- I still think that avoiding  surgery is in this patients best interest. While it has been slow, she has shown improvement both clinically and on imaging. Hopefully she can be discharged home later this week on PO abx and with outpatient follow up with one of our colorectal surgeons.   ID - rocephin/flagyl 9/6>>9/7, zosyn 9/7-9/9, ertapenem 9/9 >>; c.dif antigen positive 9/12, reviewed with ID and started fidaxomicin (10 days) FEN - SOFT, ensure VTE - SCDs, lovenox Foley - none  Klebsiella pneumoniae UTI  PMR on simponi/prednisone/MTX HTN HLD CAD Chronic back pain Jill Alexanders  MD 07/24/2022

## 2022-07-24 NOTE — TOC Progression Note (Signed)
Transition of Care [facility]) - Progression Note    Patient Details  Name: Jenny Glass MRN: [id number] Date of Birth: 1951/03/15  Transition of Care [facility]) CM/SW Contact  Leeroy Cha, RN Phone Number: 07/24/2022, 12:35 PM  Clinical Narrative:    [id number]/FVC iv tpn and reg diet started.  Iv abx ongoing.   Expected Discharge Plan: Home/Self Care Barriers to Discharge: Continued Medical Work up  Expected Discharge Plan and Services Expected Discharge Plan: Home/Self Care   Discharge Planning Services: CM Consult   Living arrangements for the past 2 months: Single Family Home                                       Social Determinants of Health (SDOH) Interventions Utilities Interventions: Intervention Not Indicated  Readmission Risk Interventions   No data to display

## 2022-07-25 DIAGNOSIS — Z221 Carrier of other intestinal infectious diseases: Secondary | ICD-10-CM

## 2022-07-25 DIAGNOSIS — M353 Polymyalgia rheumatica: Secondary | ICD-10-CM

## 2022-07-25 DIAGNOSIS — K5720 Diverticulitis of large intestine with perforation and abscess without bleeding: Secondary | ICD-10-CM | POA: Diagnosis not present

## 2022-07-25 MED ORDER — AMLODIPINE BESYLATE 5 MG PO TABS
5.0000 mg | ORAL_TABLET | Freq: Every day | ORAL | Status: DC
  Administered 2022-07-25 – 2022-07-27 (×3): 5 mg via ORAL
  Filled 2022-07-25 (×3): qty 1

## 2022-07-25 MED ORDER — SODIUM CHLORIDE 0.9 % IV SOLN
3.0000 g | Freq: Four times a day (QID) | INTRAVENOUS | Status: DC
  Administered 2022-07-26 – 2022-07-27 (×5): 3 g via INTRAVENOUS
  Filled 2022-07-25 (×6): qty 8

## 2022-07-25 NOTE — Progress Notes (Signed)
Subjective/Chief Complaint: States she feels better today, is up in the chair this morning. Reports eating better last 24h. States BMs have slowed down and had no BMs overnight. Mobilizing in her room.   Objective: Vital signs in last 24 hours: Temp:  [98.1 F (36.7 C)-99 F (37.2 C)] 98.1 F (36.7 C) (09/20 0450) Pulse Rate:  [73-76] 73 (09/20 0450) Resp:  [16-18] 18 (09/20 0450) BP: (109-118)/(71-75) 118/71 (09/20 0450) SpO2:  [95 %-96 %] 95 % (09/20 0450) Last BM Date : 07/21/22  Intake/Output from previous day: 09/19 0701 - 09/20 0700 In: 320 [P.O.:320] Out: -  Intake/Output this shift: No intake/output data recorded.   Gen:  Alert, NAD, pleasant Looks overall improved Cardio: RRR Pulm: CTAB, no wheezing, rate and effort normal  Abd: soft, ND, mild TTP in LLQ without peritonitis, no guarding   Lab Results:  Recent Labs    07/24/22 0609  WBC 8.8  HGB 11.7*  HCT 36.6  PLT 275   BMET Recent Labs    07/24/22 0609  NA 141  K 4.0  CL 107  CO2 27  GLUCOSE 89  BUN 17  CREATININE 0.96  CALCIUM 8.9    PT/INR No results for input(s): "LABPROT", "INR" in the last 72 hours. ABG No results for input(s): "PHART", "HCO3" in the last 72 hours.  Invalid input(s): "PCO2", "PO2"  Studies/Results: CT ABDOMEN PELVIS W CONTRAST  Result Date: 07/23/2022 CLINICAL DATA:  Abdominal pain, postop. Diverticulitis with abscess. EXAM: CT ABDOMEN AND PELVIS WITH CONTRAST TECHNIQUE: Multidetector CT imaging of the abdomen and pelvis was performed using the standard protocol following bolus administration of intravenous contrast. RADIATION DOSE REDUCTION: This exam was performed according to the departmental dose-optimization program which includes automated exposure control, adjustment of the mA and/or kV according to patient size and/or use of iterative reconstruction technique. CONTRAST:  111m OMNIPAQUE IOHEXOL 300 MG/ML  SOLN COMPARISON:  07/15/2022 FINDINGS: Lower chest:  Lung bases are clear. Hepatobiliary: Benign hemangioma within the left lobe of the liver is unchanged. Cyst in the medial right lower lobe is unchanged. No calcified gallstones. Pancreas: Normal Spleen: Normal Adrenals/Urinary Tract: Adrenal glands are normal. No acute renal finding. Chronic atrophy of the right kidney. Mild fullness of the left renal collecting system and ureter. No evidence of stone disease. Bladder is normal. Stomach/Bowel: Stomach and small intestine are normal. Normal appendix. There is less thickening of the wall of the sigmoid colon. There is less edema in the adjacent fat. Small abscess in the sigmoid mesentery is improving, measuring only 1 x 1.5 cm today compared with 1.5 x 2.5 cm previously. No worsening or new finding. Vascular/Lymphatic: Aortic atherosclerosis. IVC is normal. No adenopathy. Reproductive: Negative Other: No free fluid or air. Musculoskeletal: Ordinary degenerative changes affect the lumbar spine. IMPRESSION: Imaging improvement. Less inflammatory change of the sigmoid colon itself. Less edema in the adjacent fat. Decreasing size of the small fluid collection in the sigmoid mesentery, 1 x 1.5 cm today compared with 1.5 x 2.5 cm previously. Electronically Signed   By: MNelson ChimesM.D.   On: 07/23/2022 11:09    Anti-infectives: Anti-infectives (From admission, onward)    Start     Dose/Rate Route Frequency Ordered Stop   07/17/22 1530  fidaxomicin (DIFICID) tablet 200 mg        200 mg Oral 2 times daily 07/17/22 1431 07/27/22 0959   07/14/22 1100  ertapenem ([facility] injection 1,000 mg  Status:  Discontinued  1 g Intramuscular Daily 07/14/22 0935 07/14/22 1000   07/14/22 1100  ertapenem (INVANZ) 1,000 mg in sodium chloride 0.9 % 100 mL IVPB        1 g 200 mL/hr over 30 Minutes Intravenous Daily 07/14/22 1002     07/14/22 1030  ciprofloxacin (CIPRO) IVPB 400 mg  Status:  Discontinued        400 mg 200 mL/hr over 60 Minutes Intravenous Every 12 hours  07/14/22 0934 07/14/22 0935   07/14/22 1030  metroNIDAZOLE (FLAGYL) IVPB 500 mg  Status:  Discontinued        500 mg 100 mL/hr over 60 Minutes Intravenous Every 8 hours 07/14/22 0934 07/14/22 0935   07/12/22 1800  cefTRIAXone (ROCEPHIN) 2 g in sodium chloride 0.9 % 100 mL IVPB  Status:  Discontinued        2 g 200 mL/hr over 30 Minutes Intravenous Every 24 hours 07/11/22 1759 07/12/22 0836   07/12/22 0900  piperacillin-tazobactam (ZOSYN) IVPB 3.375 g  Status:  Discontinued        3.375 g 12.5 mL/hr over 240 Minutes Intravenous Every 8 hours 07/12/22 0836 07/14/22 0934   07/11/22 1800  cefTRIAXone (ROCEPHIN) 1 g in sodium chloride 0.9 % 100 mL IVPB        1 g 200 mL/hr over 30 Minutes Intravenous  Once 07/11/22 1759 07/11/22 2300   07/11/22 1730  metroNIDAZOLE (FLAGYL) IVPB 500 mg  Status:  Discontinued        500 mg 100 mL/hr over 60 Minutes Intravenous Every 12 hours 07/11/22 1726 07/12/22 0836   07/11/22 1545  cefTRIAXone (ROCEPHIN) 1 g in sodium chloride 0.9 % 100 mL IVPB        1 g 200 mL/hr over 30 Minutes Intravenous  Once 07/11/22 1540 07/11/22 1716       Assessment/Plan:     Diverticulitis with small abscess - afebrile, normal WBC, on steroids/MTX - 1st bout, last colonoscopy 2013 - CT scan 9/6 shows Acute sigmoid colon diverticulitis with small peridiverticular abscess/fluid collection, 1.9 cm in greatest dimension - CT 9/10 w/ similar amount of inflammatory changes around sigmoid colon and slight increase in fluid collection - 2.6cm  -Overall looks better, showing slow clinical improvement. CT  9/18 with decreased pericolonic inflammation and interval decrease in size of abscess. Pain and PO intake improving. I think patient could be discharged home today on appropriate PO abx - Dr. Loleta Books states he is planning to get ID input which I agree with. She will need outpatient follow up with one of our colorectal surgeons, and ideally she could also see GI and get a colonoscopy  in 6 weeks.   ID - rocephin/flagyl 9/6>>9/7, zosyn 9/7-9/9, ertapenem 9/9 >>; c.dif antigen positive 9/12, reviewed with ID and started fidaxomicin (10 days) FEN - SOFT, ensure VTE - SCDs, lovenox Foley - none  Klebsiella pneumoniae UTI  PMR on simponi/prednisone/MTX HTN HLD CAD Chronic back pain Jill Alexanders  MD 07/25/2022

## 2022-07-25 NOTE — Assessment & Plan Note (Signed)
No symptoms of active colitis - Continue fidaxomicin - ID recommended 10-day course

## 2022-07-25 NOTE — Assessment & Plan Note (Addendum)
Blood pressure normal - Continue amlodipine - Hold losartan

## 2022-07-25 NOTE — Progress Notes (Signed)
Mobility Specialist - Progress Note    07/25/22 1050  Mobility  HOB Elevated/Bed Position Self regulated  Activity Ambulated with assistance in hallway  Range of Motion/Exercises Active  Level of Assistance Modified independent, requires aide device or extra time  Assistive Device  (IV Pole)  Distance Ambulated (ft) 450 ft  Activity Response Tolerated well  Transport method Ambulatory  $Mobility charge 1 Mobility   Pt received in bed and agreeable to mobility. Pt complained of feeling dizzy during ambulation. Pt to room at [facility] with husband in room. All necessities in reach.    [facility]

## 2022-07-25 NOTE — Assessment & Plan Note (Addendum)
Continue Crestor 

## 2022-07-25 NOTE — Consult Note (Signed)
[facility]                                                                                       Patient Identification: Patient Name: Jenny Glass MRN: [id number] [place] Date: 07/11/2022  2:19 PM Today's Date: 07/25/2022 Reason for consult: Diverticular abscess, ED for antigen positive Requesting provider: Myrene Buddy  Principal Problem:   Abscess of sigmoid colon due to diverticulitis Active Problems:   Hyperlipidemia   Chronic back pain   Hypertension   Carotid artery stenosis   UTI (urinary tract infection)   Diverticulitis of intestine with abscess   Malnutrition of moderate degree   Antibiotics:  Ceftriaxone 9/6 Metronidazole 9/6 Zosyn 9/7-9/9 Ertapenem 9/9-c Fidaxomicin 9/12-  Lines/Hardware: PICC rt arm   Assessment 71 year old female with PMH of carotid artery stenosis, chronic back pain, PMR,  diverticulitis, hyperlipidemia, hypertension scented to the ED on 9/6 with nausea and and gradually worsening abdominal pain along with intermittent fevers/chills 3 days prior to admit. Admitted with   # Diverticular abscess in sigmoid mesentery in the setting of diverticulitis  # C diff ag positive but toxin negative with nosocomial diarrhea in the setting of abtx use- seems to be improving  # Urine cx with Kleb pneumo - no GU symptoms and likely colonization   # PMR - on simponi, MTX and prednisone   Recommendations  Will switch abtx from ertapenem to Unasyn and monitor how she does.  Complete 10 days of Fidaxomicin  Following  Rest of the management as per the primary team. Please call with questions or concerns.  Thank you for the consult  Rosiland Oz, MD Infectious Disease Physician [facility] [address] [place],  [address] Phone: [phone]  Fax:  [phone]  __________________________________________________________________________________________________________ HPI and Hospital Course: 71 year old female with PMH of carotid artery stenosis, chronic back pain, PMR,  diverticulitis, hyperlipidemia, hypertension scented to the ED on 9/6 with nausea and and gradually worsening abdominal pain along with intermittent fevers/chills 3 days prior to admit.  Abdominal pain is left lower quadrant and suprapubic, denies vomiting or diarrhea, chest pain, cough or shortness of breath or GU symptoms. Diarrhea seems to be improving on current treatment, 3 mushy stool yesterday and 2 so far today.   At ED febrile T max 100.8 Leukocytosis with WBC 11.1, CR 1.08 UA with positive nitrates, moderate leukocyte esterase, 21-50 WBC and many bacteria Labs remarkable for k 3.3  9/6 urinary cultures Kleb pneumoniae  Imagings as below.  No indication for surgery per general surgery patient was started on IV ceftriaxone and metronidazole and admitted.  ROS: all systems reviewed with pertinent positives and negatives as listed above  Past Medical History:  Diagnosis Date   Carotid artery stenosis    Chronic back pain    Diverticulitis    Hyperlipidemia    Hypertension    Numbness and tingling    hands bilat and lower legs bilat comes and goes    UTI (lower urinary tract infection)    Past Surgical History:  Procedure Laterality Date   SHOULDER OPEN ROTATOR CUFF  REPAIR Left 11/10/2015   Procedure: LEFT SHOULDER MINI OPEN ROTATOR CUFF REPAIR AND DISTAL CLAVICAL RESECTION;  Surgeon: Susa Day, MD;  Location: [facility];  Service: Orthopedics;  Laterality: Left;     Scheduled Meds:  calcium carbonate  1 tablet Oral Daily   Chlorhexidine Gluconate Cloth  6 each Topical Daily   enoxaparin (LOVENOX) injection  40 mg Subcutaneous Q24H   feeding supplement  237 mL Oral TID WC   fiber supplement (BANATROL TF)  60 mL Oral BID   fidaxomicin  200 mg Oral BID    folic acid  496 mcg Oral Daily   guaiFENesin  600 mg Oral BID   predniSONE  2 mg Oral Q breakfast   rosuvastatin  10 mg Oral Daily   sodium chloride flush  10-40 mL Intracatheter Q12H   Continuous Infusions:  sodium chloride Stopped (07/17/22 0844)   ertapenem 1,000 mg (07/24/22 0925)   PRN Meds:.acetaminophen **OR** acetaminophen, hydrocortisone, morphine injection, ondansetron **OR** ondansetron (ZOFRAN) IV, senna-docusate, sodium chloride flush, traMADol, witch hazel-glycerin  No Known Allergies  Social History   Socioeconomic History   Marital status: Married    Spouse name: Not on file   Number of children: Not on file   Years of education: Not on file   Highest education level: Not on file  Occupational History   Not on file  Tobacco Use   Smoking status: Never   Smokeless tobacco: Never  Vaping Use   Vaping Use: Never used  Substance and Sexual Activity   Alcohol use: No   Drug use: No   Sexual activity: Never    Birth control/protection: None  Other Topics Concern   Not on file  Social History Narrative   Not on file   Social Determinants of Health   Financial Resource Strain: Not on file  Food Insecurity: No Food Insecurity (07/20/2022)   Hunger Vital Sign    Worried About Running Out of Food in the Last Year: Never true    Ran Out of Food in the Last Year: Never true  Transportation Needs: No Transportation Needs (07/20/2022)   PRAPARE - Hydrologist (Medical): No    Lack of Transportation (Non-Medical): No  Physical Activity: Not on file  Stress: Not on file  Social Connections: Not on file  Intimate Partner Violence: Not At Risk (07/20/2022)   Humiliation, Afraid, Rape, and Kick questionnaire    Fear of Current or Ex-Partner: No    Emotionally Abused: No    Physically Abused: No    Sexually Abused: No   Family History  Problem Relation Age of Onset   Cancer Mother    Cancer Father     Vitals BP 120/73 (BP  Location: Left Arm)   Pulse 73   Temp 98 F (36.7 C) (Oral)   Resp 16   Ht '5\' 3"'$  (1.6 m)   Wt 72.3 kg   SpO2 99%   BMI 28.22 kg/m   Physical Exam Constitutional:  lying in the bed, not in acute distress    Comments:   Cardiovascular:     Rate and Rhythm: Normal rate and regular rhythm.     Heart sounds:   Pulmonary:     Effort: Pulmonary effort is normal.     Comments: Normal breath sounds   Abdominal:     Palpations: Abdomen is soft.     Tenderness: mild tenderness at the LLQ, no RT and guarding   Musculoskeletal:  General: No swelling or tenderness in peripheral joints  Skin:    Comments: No lesions or rashes   Neurological:     General: No focal deficit present. Awake, alert and oriented   Psychiatric:        Mood and Affect: Mood normal.    Pertinent Microbiology Results for orders placed or performed during the hospital encounter of 07/11/22  Urine Culture     Status: Abnormal   Collection Time: 07/11/22  2:46 PM   Specimen: Urine, Clean Catch  Result Value Ref Range Status   Specimen Description   Final    URINE, CLEAN CATCH Performed at [facility], [place] [address], [place], [place] [address]    Special Requests   Final    NONE Performed at [facility], [place] [address], [place], [place] [address]    Culture >=100,000 COLONIES/mL KLEBSIELLA PNEUMONIAE (A)  Final   Report Status 07/13/2022 FINAL  Final   Organism ID, Bacteria KLEBSIELLA PNEUMONIAE (A)  Final      Susceptibility   Klebsiella pneumoniae - MIC*    AMPICILLIN RESISTANT Resistant     CEFAZOLIN <=4 SENSITIVE Sensitive     CEFEPIME <=0.12 SENSITIVE Sensitive     CEFTRIAXONE <=0.25 SENSITIVE Sensitive     CIPROFLOXACIN <=0.25 SENSITIVE Sensitive     GENTAMICIN <=1 SENSITIVE Sensitive     IMIPENEM <=0.25 SENSITIVE Sensitive     NITROFURANTOIN 64 INTERMEDIATE Intermediate     TRIMETH/SULFA <=20 SENSITIVE Sensitive     AMPICILLIN/SULBACTAM 4  SENSITIVE Sensitive     PIP/TAZO <=4 SENSITIVE Sensitive     * >=100,000 COLONIES/mL KLEBSIELLA PNEUMONIAE  C Difficile Quick Screen (NO PCR Reflex)     Status: Abnormal   Collection Time: 07/17/22 12:52 PM   Specimen: STOOL  Result Value Ref Range Status   C Diff antigen POSITIVE (A) NEGATIVE Final   C Diff toxin NEGATIVE NEGATIVE Final   C Diff interpretation   Final    Results are indeterminate. Please contact the provider listed for your campus for C diff questions in [place].    Comment: Performed at [facility], [place] [address] [place], [place] [address]    Pertinent Lab seen by me:    Latest Ref Rng & Units 07/24/2022    6:09 AM 07/22/2022    9:00 AM 07/18/2022    6:55 AM  CBC  WBC 4.0 - 10.5 K/uL 8.8  8.2  7.3   Hemoglobin 12.0 - 15.0 g/dL 11.7  12.9  10.6   Hematocrit 36.0 - 46.0 % 36.6  40.9  32.9   Platelets 150 - 400 K/uL 275  319  287       Latest Ref Rng & Units 07/24/2022    6:09 AM 07/19/2022    6:00 AM 07/18/2022    6:55 AM  CMP  Glucose 70 - 99 mg/dL 89  92  107   BUN 8 - 23 mg/dL '17  17  13   '$ Creatinine 0.44 - 1.00 mg/dL 0.96  0.69  0.75   Sodium 135 - 145 mmol/L 141  141  144   Potassium 3.5 - 5.1 mmol/L 4.0  4.0  3.5   Chloride 98 - 111 mmol/L 107  106  109   CO2 22 - 32 mmol/L '27  31  30   '$ Calcium 8.9 - 10.3 mg/dL 8.9  8.9  8.4   Total Protein 6.5 - 8.1 g/dL 6.4  6.3    Total  Bilirubin 0.3 - 1.2 mg/dL 0.7  0.4    Alkaline Phos 38 - 126 U/L 57  53    AST 15 - 41 U/L 18  21    ALT 0 - 44 U/L 19  17      Pertinent Imagings/Other Imagings Plain films and CT images have been personally visualized and interpreted; radiology reports have been reviewed. Decision making incorporated into the Impression / Recommendations.  CT ABDOMEN PELVIS W CONTRAST  Result Date: 07/23/2022 CLINICAL DATA:  Abdominal pain, postop. Diverticulitis with abscess. EXAM: CT ABDOMEN AND PELVIS WITH CONTRAST TECHNIQUE: Multidetector CT imaging of the abdomen and  pelvis was performed using the standard protocol following bolus administration of intravenous contrast. RADIATION DOSE REDUCTION: This exam was performed according to the departmental dose-optimization program which includes automated exposure control, adjustment of the mA and/or kV according to patient size and/or use of iterative reconstruction technique. CONTRAST:  175m OMNIPAQUE IOHEXOL 300 MG/ML  SOLN COMPARISON:  07/15/2022 FINDINGS: Lower chest: Lung bases are clear. Hepatobiliary: Benign hemangioma within the left lobe of the liver is unchanged. Cyst in the medial right lower lobe is unchanged. No calcified gallstones. Pancreas: Normal Spleen: Normal Adrenals/Urinary Tract: Adrenal glands are normal. No acute renal finding. Chronic atrophy of the right kidney. Mild fullness of the left renal collecting system and ureter. No evidence of stone disease. Bladder is normal. Stomach/Bowel: Stomach and small intestine are normal. Normal appendix. There is less thickening of the wall of the sigmoid colon. There is less edema in the adjacent fat. Small abscess in the sigmoid mesentery is improving, measuring only 1 x 1.5 cm today compared with 1.5 x 2.5 cm previously. No worsening or new finding. Vascular/Lymphatic: Aortic atherosclerosis. IVC is normal. No adenopathy. Reproductive: Negative Other: No free fluid or air. Musculoskeletal: Ordinary degenerative changes affect the lumbar spine. IMPRESSION: Imaging improvement. Less inflammatory change of the sigmoid colon itself. Less edema in the adjacent fat. Decreasing size of the small fluid collection in the sigmoid mesentery, 1 x 1.5 cm today compared with 1.5 x 2.5 cm previously. Electronically Signed   By: MNelson ChimesM.D.   On: 07/23/2022 11:09   [place]KG SITE RITE  Result Date: 07/16/2022 If Site Rite image not attached, placement could not be confirmed due to current cardiac rhythm.  CT ABDOMEN PELVIS W CONTRAST  Result Date: 07/15/2022 CLINICAL DATA:   Follow-up diverticulitis and diverticular abscess. EXAM: CT ABDOMEN AND PELVIS WITH CONTRAST TECHNIQUE: Multidetector CT imaging of the abdomen and pelvis was performed using the standard protocol following bolus administration of intravenous contrast. RADIATION DOSE REDUCTION: This exam was performed according to the departmental dose-optimization program which includes automated exposure control, adjustment of the mA and/or kV according to patient size and/or use of iterative reconstruction technique. CONTRAST:  1078mOMNIPAQUE IOHEXOL 300 MG/ML  SOLN COMPARISON:  07/11/2022 FINDINGS: Lower Chest: No acute findings. Hepatobiliary: Stable 2 cm benign hemangioma in segment 2 of the left hepatic lobe. Stable 9 mm cyst in the inferior right hepatic lobe. No new or enlarging liver lesions are identified. Gallbladder is unremarkable. No evidence of biliary ductal dilatation. Pancreas:  No mass or inflammatory changes. Spleen: Within normal limits in size and appearance. Adrenals/Urinary Tract: No suspicious masses identified. Moderate chronic right renal parenchymal scarring is again noted. No evidence of ureteral calculi. Mild bilateral hydroureteronephrosis has increased since previous study due to involvement by diverticulitis described below. Stomach/Bowel: Stable moderate hiatal hernia. Moderate sigmoid diverticulitis shows no significant change since prior  study. A small rim enhancing gas and fluid collection in the central sigmoid mesentery currently measures 2.6 x 1.4 cm, compared to 2.3 x 1.1 cm previously. No other abscess or free fluid identified. Vascular/Lymphatic: No pathologically enlarged lymph nodes. No acute vascular findings. Reproductive:  No mass or other significant abnormality. Other:  None. Musculoskeletal:  No suspicious bone lesions identified. IMPRESSION: No significant change in moderate sigmoid diverticulitis. Slight increase in size of 2.6 cm diverticular abscess in central sigmoid  mesentery. Increased mild bilateral hydroureteronephrosis due to involvement of both ureters by diverticulitis. Stable moderate hiatal hernia. Electronically Signed   By: Marlaine Hind M.D.   On: 07/15/2022 12:26   DG CHEST PORT 1 VIEW  Result Date: 07/13/2022 CLINICAL DATA:  Shortness of breath, productive cough. EXAM: PORTABLE CHEST 1 VIEW COMPARISON:  CT scan of Mar 12, 2019.  Radiograph of June 05, 2013. FINDINGS: The heart size and mediastinal contours are within normal limits. Both lungs are clear. The visualized skeletal structures are unremarkable. IMPRESSION: No active disease. Electronically Signed   By: Marijo Conception M.D.   On: 07/13/2022 09:14   CT ABDOMEN PELVIS W CONTRAST  Result Date: 07/11/2022 CLINICAL DATA:  Left lower quadrant abdominal pain. EXAM: CT ABDOMEN AND PELVIS WITH CONTRAST TECHNIQUE: Multidetector CT imaging of the abdomen and pelvis was performed using the standard protocol following bolus administration of intravenous contrast. RADIATION DOSE REDUCTION: This exam was performed according to the departmental dose-optimization program which includes automated exposure control, adjustment of the mA and/or kV according to patient size and/or use of iterative reconstruction technique. CONTRAST:  68m OMNIPAQUE IOHEXOL 300 MG/ML  SOLN COMPARISON:  01/10/2019. FINDINGS: Lower chest: Chronic scarring at the left lateral lung base. Small hiatal hernia. No acute findings. Hepatobiliary: Liver normal in overall size attenuation. Stable left lobe hemangioma. Subcentimeter low-attenuation lesion, segment 7 consistent with a cyst. No other liver abnormalities. Gallbladder is distended, otherwise unremarkable. No bile duct dilation. Pancreas: Unremarkable. No pancreatic ductal dilatation or surrounding inflammatory changes. Spleen: Normal in size without focal abnormality. Adrenals/Urinary Tract: Normal adrenal glands. Right mid to lower pole renal cortical scarring. 12 mm low-attenuation  mass, lower pole the right kidney, stable consistent with a cyst. No follow-up recommended. No renal stones. No hydronephrosis. Ureters are normal in course and in caliber. Bladder is unremarkable. Stomach/Bowel: Mild wall thickening with adjacent inflammatory stranding and haziness along the proximal to mid sigmoid colon consistent with diverticulitis. Small fluid collection lies in the mesentery adjacent to the involved segment of sigmoid colon, 1.9 x 1.2 x 1.5 cm. No extraluminal or free air. Multiple diverticula are noted along the sigmoid colon, less prominently along the descending colon. There are no other areas of colonic inflammation. Stomach unremarkable other than the hiatal hernia. Normal small bowel. Normal appendix visualized. Vascular/Lymphatic: Minor aortic atherosclerosis. No aneurysm. No enlarged lymph nodes. Reproductive: Uterus and bilateral adnexa are unremarkable. Other: No abdominal wall hernia or abnormality. No abdominopelvic ascites. Musculoskeletal: No fracture or acute finding.  No bone lesion. IMPRESSION: 1. Acute sigmoid colon diverticulitis. Small peridiverticular abscess/fluid collection, 1.9 cm in greatest dimension. No other complicating features. 2. No other acute abnormality within the abdomen or pelvis. Electronically Signed   By: DLajean ManesM.D.   On: 07/11/2022 17:09     I spent 85  minutes for this patient encounter including review of prior medical records/discussing diagnostics and treatment plan with the patient/family/coordinate care with primary/other specialits with greater than 50% of time in face to face  encounter.   Electronically signed by:   Rosiland Oz, MD Infectious Disease Physician [facility] Pager: [phone]

## 2022-07-25 NOTE — Assessment & Plan Note (Signed)
Continue Crestor 

## 2022-07-25 NOTE — Progress Notes (Signed)
  Progress Note   Patient: Jenny Glass VZD:[id number] DOB: 29-Aug-1951 DOA: 07/11/2022     13 DOS: the patient was seen and examined on 07/25/2022 at 8:48AM      Brief hospital course: Mrs. Neils is a 71 y.o. F with PMR on prednisone, HGN, HLD, and chronic back pain who presented with LLQ and nausea/chills.    In the ER, CT showed 2cm abscess and sigmoid diverticulitis.   Admitted on antibiotics.        Assessment and Plan: * Abscess of sigmoid colon due to diverticulitis Patient has had an extremely long and slow recovery, which appears to be nonorganic.  Her labs, vital signs are normal.  Repeat CT shows resolving absecess, as expected.  Her main complaint is lack of appetite and that food "doesn't taste good".  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 - Continue prednisone  Malnutrition of moderate degree     PVD (peripheral vascular disease) (HCC) - Continue Crestor  Hypertension BP slightly high - Resume amlodipine - Hold losartan  Chronic back pain     Hyperlipidemia - Continue Crestor          Subjective: No complaints.  Still no taste for food.  No fever, still mild abdominal pain.      Physical Exam: BP 120/73 (BP Location: Left Arm)   Pulse 73   Temp 98 F (36.7 C) (Oral)   Resp 16   Ht '5\' 3"'$  (1.6 m)   Wt 72.3 kg   SpO2 99%   BMI 28.22 kg/m   Thin adult female, sitting up in bed, no acute distress RRR, no murmurs, no peripheral edema Respiratory rate normal, lungs clear without rales or wheezes Abdomen soft, some mild tenderness in the left lower quadrant, no rigidity, no rebound, no distention Attention normal, affect normal, judgment insight appear normal    Data Reviewed: Discussed with general  surgery and infectious disease Basic metabolic panel and complete blood count normal yesterday CT of the abdomen pelvis from few days ago shows reduced size of her diverticular abscess C. difficile antigen positive, toxin negative  Family Communication: Husband at the bedside   Disposition: Status is: Inpatient        Author: Edwin Dada, MD 07/25/2022 2:54 PM  For on call review [email or web address].

## 2022-07-25 NOTE — Assessment & Plan Note (Signed)
On Simponi and MTX and prednisone as an outpatient. - Continue prednisone

## 2022-07-25 NOTE — Assessment & Plan Note (Addendum)
Patient has had an extremely long and slow recovery, which appears to be nonorganic.  Her labs, vital signs are normal.  Repeat CT shows resolving absecess, as expected.  Her main complaint is lack of appetite and that food "doesn't taste good".  - Continue ertapenem - Consult ID, appreciate cares

## 2022-07-26 DIAGNOSIS — E785 Hyperlipidemia, unspecified: Secondary | ICD-10-CM

## 2022-07-26 DIAGNOSIS — I10 Essential (primary) hypertension: Secondary | ICD-10-CM | POA: Diagnosis not present

## 2022-07-26 DIAGNOSIS — K5720 Diverticulitis of large intestine with perforation and abscess without bleeding: Secondary | ICD-10-CM | POA: Diagnosis not present

## 2022-07-26 DIAGNOSIS — M353 Polymyalgia rheumatica: Secondary | ICD-10-CM

## 2022-07-26 DIAGNOSIS — E440 Moderate protein-calorie malnutrition: Secondary | ICD-10-CM

## 2022-07-26 DIAGNOSIS — Z221 Carrier of other intestinal infectious diseases: Secondary | ICD-10-CM | POA: Diagnosis not present

## 2022-07-26 DIAGNOSIS — I739 Peripheral vascular disease, unspecified: Secondary | ICD-10-CM

## 2022-07-26 NOTE — Progress Notes (Signed)
  Progress Note   Patient: Jenny Glass IRS:[id number] DOB: 10-20-1951 DOA: 07/11/2022     14 DOS: the patient was seen and examined on 07/26/2022 at 11 AM      Brief hospital course: Jenny Glass is a 71 y.o. F with PMR on prednisone, HGN, HLD, and chronic back pain who presented with LLQ and nausea/chills.    In the ER, CT showed 2cm abscess and sigmoid diverticulitis.   Admitted on antibiotics.        Assessment and Plan: * Abscess of sigmoid colon due to diverticulitis Patient has had an extremely long and slow recovery, which appears to be nonorganic.  Her labs, vital signs are normal.  Repeat CT shows resolving absecess, as expected.     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 - Continue prednisone  Malnutrition of moderate degree As evidenced by moderate loss of subcutaneous muscle mass and fat, prolonged period of poor p.o. intake  PVD (peripheral vascular disease) (HCC) - Continue Crestor  Hypertension Blood pressure normal - Continue amlodipine - Hold losartan  Chronic back pain Due to polymyalgia rheumatica - Continue prednisone, tramadol    Hyperlipidemia - Continue Crestor          Subjective: Patient has no change, no fever, no confusion, no change in abdominal pain.     Physical Exam: BP 102/72 (BP Location: Right Arm)   Pulse 71   Temp 98.3 F (36.8 C) (Oral)   Resp 15   Ht '5\' 3"'$  (1.6 m)   Wt 72.3 kg   SpO2 95%   BMI 28.22 kg/m   Elderly adult female, lying in bed, no acute distress RRR, no murmurs, no peripheral edema Respiratory rate normal, lungs clear without rales or wheezes Abdomen soft without tenderness palpation or guarding, no ascites  or distention Attention normal, affect appropriate, judgment and insight appear normal  Data Reviewed: Discussed with infectious disease   Family Communication: Husband at the bedside    Disposition: Status is: Inpatient The patient has had a prolonged hospital stay due to unexpectedly long period of poor p.o. intake/anorexia requiring TPN  This has slowly improved, currently, discharge delayed due to need for ID to monitor patient to determine safe discharge antibiotic regimen          Author: Edwin Dada, MD 07/26/2022 1:36 PM  For on call review [email or web address].

## 2022-07-26 NOTE — Progress Notes (Signed)
Mobility Specialist - Progress Note   07/26/22 1337  Mobility  Activity Ambulated with assistance in hallway  Level of Assistance Independent after set-up  Assistive Device None  Distance Ambulated (ft) 500 ft  Activity Response Tolerated well  $Mobility charge 1 Mobility   Pt received in bed and agreed for mobility, no c/o pain nor discomfort during ambulation. Pt to bed with all needs met and family in room.   Roderick Pee Mobility Specialist

## 2022-07-26 NOTE — Progress Notes (Addendum)
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 Admitted with    #Diverticular abscess in the sigmoid mesentery in the setting of diverticulitis #C. difficile antigen positive but toxin negative with nosocomial onset of diarrhea setting of antibiotic use -had 2 mushy bowel movements yesterday and one today, improved  #PMR-on Simponi, methotrexate and prednisone  Recommendations Continue IV Unasyn as is.  Switch to p.o. Augmentin tomorrow if she continues to improve clinically and provide her with a 3 weeks course We will follow-up in the clinic in 2-3 weeks for repeat imaging Needs to follow-up with surgery Complete 10-day course of fidaxomicin. Although no definite reason to continue C diff  prophylaxis currently given  her C. difficile test was indeterminate.  However I am told by Pharm D that she was already given a course of p.o. fidaxomicin # 20 doses for ppx.  I think it is reasonable to continue PO fidaxomicin at  prophylactic dosing as she is at high risk for C diff due to her age, immunocompromise status and concomitant antibiotic use.  Discussed with Dr. Loleta Books ID available as needed, please call with questions.   Rest of the management as per the primary team. Thank you for the consult. Please page with pertinent questions or concerns.  ______________________________________________________________________ Subjective patient seen and examined at the bedside.  Husband at bedside Continues to have left lower quadrant pain and suprapubic pain but no fevers, nausea or vomiting She had 2 mushy bowel movements yesterday and 1 today  Vitals BP 102/72 (BP Location: Right Arm)   Pulse 71   Temp 98.3 F (36.8 C) (Oral)   Resp 15   Ht '5\' 3"'$  (1.6 m)   Wt 72.3 kg   SpO2 95%   BMI 28.22 kg/m     Physical Exam Constitutional: Lying in the bed and appears comfortable    Comments:   Cardiovascular:     Rate and Rhythm: Normal rate and regular rhythm.     Heart sounds:  Pulmonary:     Effort: Pulmonary effort is normal.     Comments:   Abdominal:     Palpations: Abdomen is soft.     Tenderness: Mild tenderness in the left lower quadrant and suprapubic region but no rebound tenderness and guarding or rigidity  Musculoskeletal:        General: No swelling or tenderness in peripheral joints.  No.  Pedal edema  Skin:    Comments: No obvious lesions or rashes  Neurological:     General: No focal deficit present.  Awake alert and oriented  Psychiatric:        Mood and Affect: Mood normal.   Pertinent  Microbiology Results for orders placed or performed during the hospital encounter of 07/11/22  Urine Culture     Status: Abnormal   Collection Time: 07/11/22  2:46 PM   Specimen: Urine, Clean Catch  Result Value Ref Range Status   Specimen Description   Final    URINE, CLEAN CATCH Performed at [facility], [place] [address] [place], [place] [address]    Special  Requests   Final    NONE Performed at [facility], [place] [address] [place], [place] [address]    Culture >=100,000 COLONIES/mL KLEBSIELLA PNEUMONIAE (A)  Final   Report Status 07/13/2022 FINAL  Final   Organism ID, Bacteria KLEBSIELLA PNEUMONIAE (A)  Final      Susceptibility   Klebsiella pneumoniae - MIC*    AMPICILLIN RESISTANT Resistant     CEFAZOLIN <=4 SENSITIVE Sensitive     CEFEPIME <=0.12 SENSITIVE Sensitive     CEFTRIAXONE <=0.25 SENSITIVE Sensitive     CIPROFLOXACIN <=0.25 SENSITIVE Sensitive     GENTAMICIN <=1 SENSITIVE Sensitive     IMIPENEM <=0.25 SENSITIVE Sensitive     NITROFURANTOIN 64 INTERMEDIATE Intermediate     TRIMETH/SULFA <=20 SENSITIVE Sensitive     AMPICILLIN/SULBACTAM 4 SENSITIVE Sensitive     PIP/TAZO <=4 SENSITIVE Sensitive     * >=100,000 COLONIES/mL KLEBSIELLA PNEUMONIAE  C Difficile Quick Screen (NO PCR Reflex)     Status: Abnormal   Collection Time: 07/17/22 12:52 PM   Specimen: STOOL  Result Value Ref Range Status   C Diff antigen POSITIVE (A) NEGATIVE Final   C Diff toxin NEGATIVE NEGATIVE Final   C Diff interpretation   Final    Results are indeterminate. Please contact the provider listed for your campus for C diff questions in [place].    Comment: Performed at [facility], [place] [address], [place], [place] [address]    Pertinent Lab.    Latest Ref Rng & Units 07/24/2022    6:09 AM 07/22/2022    9:00 AM 07/18/2022    6:55 AM  CBC  WBC 4.0 - 10.5 K/uL 8.8  8.2  7.3   Hemoglobin 12.0 - 15.0 g/dL 11.7  12.9  10.6   Hematocrit 36.0 - 46.0 % 36.6  40.9  32.9   Platelets 150 - 400 K/uL 275  319  287       Latest Ref Rng & Units 07/24/2022    6:09 AM 07/19/2022    6:00 AM 07/18/2022    6:55 AM  CMP  Glucose 70 - 99 mg/dL 89  92  107   BUN 8 - 23 mg/dL '17  17  13   '$ Creatinine 0.44 - 1.00 mg/dL 0.96  0.69  0.75   Sodium 135 - 145 mmol/L 141  141  144   Potassium 3.5 - 5.1 mmol/L 4.0  4.0  3.5   Chloride  98 - 111 mmol/L 107  106  109   CO2 22 - 32 mmol/L '27  31  30   '$ Calcium 8.9 - 10.3 mg/dL 8.9  8.9  8.4   Total Protein 6.5 - 8.1 g/dL 6.4  6.3    Total Bilirubin 0.3 - 1.2 mg/dL 0.7  0.4    Alkaline Phos 38 - 126 U/L 57  53    AST 15 - 41 U/L 18  21    ALT 0 - 44 U/L 19  17       Pertinent Imaging today Plain films and CT  images have been personally visualized and interpreted; radiology reports have been reviewed. Decision making incorporated into the Impression / Recommendations.  No results found.  I spent 50 minutes for this patient encounter including review of prior medical records, coordination of care with primary/other specialist with greater than 50% of time being face to face/counseling and discussing diagnostics/treatment plan with the patient/family.  Electronically signed by:   Rosiland Oz, MD Infectious Disease Physician [facility] Pager: [phone]

## 2022-07-27 DIAGNOSIS — K5720 Diverticulitis of large intestine with perforation and abscess without bleeding: Secondary | ICD-10-CM | POA: Diagnosis not present

## 2022-07-27 MED ORDER — FIDAXOMICIN 200 MG PO TABS: 200.0000 mg | ORAL_TABLET | ORAL | Status: DC

## 2022-07-27 MED ORDER — AMOXICILLIN-POT CLAVULANATE 875-125 MG PO TABS: 1.0000 | ORAL_TABLET | Freq: Two times a day (BID) | ORAL | 0 refills | Status: DC

## 2022-07-27 MED ORDER — AMOXICILLIN-POT CLAVULANATE 875-125 MG PO TABS
1.0000 | ORAL_TABLET | Freq: Two times a day (BID) | ORAL | Status: DC
  Administered 2022-07-27: 1 via ORAL
  Filled 2022-07-27: qty 1

## 2022-07-27 MED ORDER — ENSURE ENLIVE PO LIQD: 237.0000 mL | Freq: Three times a day (TID) | ORAL | 12 refills | Status: DC

## 2022-07-27 NOTE — Progress Notes (Signed)
Mobility Specialist - Progress Note   07/27/22 1030  Mobility  HOB Elevated/Bed Position Self regulated  Activity Ambulated with assistance in hallway  Range of Motion/Exercises Active  Level of Assistance Independent  Assistive Device None  Distance Ambulated (ft) 500 ft  Activity Response Tolerated well  Transport method Ambulatory  $Mobility charge 1 Mobility   Pt received in bench and agreeable to mobility. Pt to bench with husband in room after session with all needs met.     [facility]

## 2022-07-27 NOTE — Progress Notes (Signed)
Provided patient, and patient's husband with AVS discharge instructions, they had no further questions upon discharge.

## 2022-07-27 NOTE — TOC Progression Note (Signed)
Transition of Care [facility]) - Progression Note    Patient Details  Name: [name] MRN: [id number] Date of Birth: May 10, 1951  Transition of Care [facility]) CM/SW Contact  Servando Snare, [place] Phone Number: 07/27/2022, 9:55 AM  Clinical Narrative:     Transition of Care [facility]) Screening Note   Patient Details  Name: Jenny Glass Date of Birth: 03-29-1951   Transition of Care [facility]) CM/SW Contact:    Servando Snare, LCSW Phone Number: 07/27/2022, 9:55 AM    Transition of Care Department [facility]) has reviewed patient and no TOC needs have been identified at this time. We will continue to monitor patient advancement through interdisciplinary progression rounds. If new patient transition needs arise, please place a TOC consult.     Expected Discharge Plan: Home/Self Care Barriers to Discharge: Continued Medical Work up  Expected Discharge Plan and Services Expected Discharge Plan: Home/Self Care   Discharge Planning Services: CM Consult   Living arrangements for the past 2 months: Single Family Home Expected Discharge Date: 07/27/22                                     Social Determinants of Health (SDOH) Interventions Utilities Interventions: Intervention Not Indicated  Readmission Risk Interventions     No data to display

## 2022-07-27 NOTE — Discharge Summary (Signed)
Physician Discharge Summary   Patient: Jenny Glass MRN: [id number] DOB: 05-31-1951  Admit date:     07/11/2022  Discharge date: 07/27/22  Discharge Physician: Edwin Dada   PCP: Merrilee Seashore, MD     Recommendations at discharge:  Follow up with Dr. Dema Severin General Surgery Follow up with Infectious Disease Dr. West Bali, appt scheduled Dr. Dema Severin or Dr. 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 F with PMR on prednisone, HGN, HLD, and chronic back pain who presented with LLQ and nausea/chills.    In the ER, CT showed 2cm abscess and sigmoid diverticulitis.   Admitted on antibiotics.        * Abscess of sigmoid colon due to diverticulitis Patient has had an extremely long and slow recovery, which appears to be nonorganic.    ID were consulted and recommended Augmentin for 1 month and repeat imaging as an outpatient.     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  ID recommended every other day fidaxomicin for duration of Augmentin.  Prescription mailed to her house and confirmed by ID RPh.    Polymyalgia rheumatica (HCC) On Simponi and MTX and prednisone as an outpatient.  Hold Simponi and Methotrexate until infection resolved.  Continue low dose prednisone.              The [facility] Controlled Substances Registry was reviewed for this patient prior to discharge.  Consultants: [facility], infectious disease   Disposition: Home   DISCHARGE MEDICATION: Allergies as of 07/27/2022   No Known Allergies       Medication List     STOP taking these medications    losartan 50 MG tablet Commonly known as: COZAAR   methotrexate (PF) 50 MG/2ML injection   NON FORMULARY       TAKE these medications    amLODipine 5 MG tablet Commonly known as: NORVASC Take 5 mg by mouth every morning.   amoxicillin-clavulanate 875-125 MG tablet Commonly known as: AUGMENTIN Take 1 tablet by mouth every 12 (twelve) hours for 21 days.   Calcium Carbonate Antacid 400 MG Chew Chew 1 tablet by mouth daily.   feeding supplement Liqd Take 237 mLs by mouth 3 (three) times daily with meals.   fidaxomicin 200 MG Tabs tablet Commonly known as: DIFICID Take 1 tablet (200 mg total) by mouth every other day.   folic acid 147 MCG tablet Commonly known as: FOLVITE Take 400 mcg by mouth daily.   predniSONE 1 MG tablet Commonly known as: DELTASONE Take 2 mg by mouth daily.   rosuvastatin 10 MG tablet Commonly known as: CRESTOR Take 10 mg by mouth daily.        Follow-up Information     Ileana Roup, MD Follow up on 08/23/2022.   Specialties: General Surgery, Colon and Rectal Surgery Why: at 10:30 AM for follow up with a colorectal surgeon. please arrive by 10 AM and bring photo ID/insurance information. Contact information: [place] [address] [phone]         Merrilee Seashore, MD. Schedule  an appointment as soon as possible for a visit in 1 week(s).   Specialty: Internal Medicine Contact information: [place] [address] [phone]                 Discharge Instructions     Discharge instructions   Complete by: As directed    From Dr. Loleta Books: You were admitted with a small abscess from diverticulitis. You were treated with antibiotics You had a repeat CT scan that showed the abscess was shrinking You had a prolonged course because of poor oral intake, and even required "total parenteral nutrition" for a  time. Thankfully, your intake got better  I would recommend eating as much nutritious food as you can for the next few weeks while you are healing To promote recovery, drink an Ensure Plus twice daily  Resume your prednisone  Take the antibiotic Augmentin 875-125 mg twice daily for 3 more weeks While you are taking Augmentin, take the fidaxomicin once every other day   You have TWO IMPORTANT FOLLOW UP APPOINTMENTS: You have an appointment with Infectious Disease Dr. West Bali (see below in the "To Do" section)  You have to follow up with Dr. Dema Severin from General Surgery as well (also, see below in the "To Do" section)  One of them will need to obtain a CT scan of your abdomen, ask who will do it   Increase activity slowly   Complete by: As directed        Discharge Exam: Filed Weights   07/16/22 1239  Weight: 72.3 kg    General: Pt is alert, awake, not in acute distress Cardiovascular: RRR, nl S1-S2, no murmurs appreciated.   No LE edema.   Respiratory: Normal respiratory rate and rhythm.  CTAB without rales or wheezes. Abdominal: Abdomen soft and with mild tenderness diffusely, voluntary guarding, no ridigidy no rebound.  No distension or HSM.   Neuro/Psych: Strength symmetric in upper and lower extremities.  Judgment and insight appear normal.   Condition at discharge: good  The results of significant diagnostics from this hospitalization (including imaging, microbiology, ancillary and laboratory) are listed below for reference.   Imaging Studies: CT ABDOMEN PELVIS W CONTRAST  Result Date: 07/23/2022 CLINICAL DATA:  Abdominal pain, postop. Diverticulitis with abscess. EXAM: CT ABDOMEN AND PELVIS WITH CONTRAST TECHNIQUE: Multidetector CT imaging of the abdomen and pelvis was performed using the standard protocol following bolus administration of intravenous contrast. RADIATION DOSE REDUCTION: This exam was performed according to the departmental dose-optimization program  which includes automated exposure control, adjustment of the mA and/or kV according to patient size and/or use of iterative reconstruction technique. CONTRAST:  175m OMNIPAQUE IOHEXOL 300 MG/ML  SOLN COMPARISON:  07/15/2022 FINDINGS: Lower chest: Lung bases are clear. Hepatobiliary: Benign hemangioma within the left lobe of the liver is unchanged. Cyst in the medial right lower lobe is unchanged. No calcified gallstones. Pancreas: Normal Spleen: Normal Adrenals/Urinary Tract: Adrenal glands are normal. No acute renal finding. Chronic atrophy of the right kidney. Mild fullness of the left renal collecting system and ureter. No evidence of stone disease. Bladder is normal. Stomach/Bowel: Stomach and small intestine are normal. Normal appendix. There is less thickening of the wall of the sigmoid colon. There is less edema in the adjacent fat. Small abscess in the sigmoid mesentery is improving, measuring only 1 x 1.5 cm today compared with 1.5 x 2.5 cm previously. No worsening or new finding. Vascular/Lymphatic: Aortic atherosclerosis. IVC is normal. No adenopathy. Reproductive: Negative  Other: No free fluid or air. Musculoskeletal: Ordinary degenerative changes affect the lumbar spine. IMPRESSION: Imaging improvement. Less inflammatory change of the sigmoid colon itself. Less edema in the adjacent fat. Decreasing size of the small fluid collection in the sigmoid mesentery, 1 x 1.5 cm today compared with 1.5 x 2.5 cm previously. Electronically Signed   By: Nelson Chimes M.D.   On: 07/23/2022 11:09   [place] EKG SITE RITE  Result Date: 07/16/2022 If Site Rite image not attached, placement could not be confirmed due to current cardiac rhythm.  CT ABDOMEN PELVIS W CONTRAST  Result Date: 07/15/2022 CLINICAL DATA:  Follow-up diverticulitis and diverticular abscess. EXAM: CT ABDOMEN AND PELVIS WITH CONTRAST TECHNIQUE: Multidetector CT imaging of the abdomen and pelvis was performed using the standard protocol following  bolus administration of intravenous contrast. RADIATION DOSE REDUCTION: This exam was performed according to the departmental dose-optimization program which includes automated exposure control, adjustment of the mA and/or kV according to patient size and/or use of iterative reconstruction technique. CONTRAST:  162m OMNIPAQUE IOHEXOL 300 MG/ML  SOLN COMPARISON:  07/11/2022 FINDINGS: Lower Chest: No acute findings. Hepatobiliary: Stable 2 cm benign hemangioma in segment 2 of the left hepatic lobe. Stable 9 mm cyst in the inferior right hepatic lobe. No new or enlarging liver lesions are identified. Gallbladder is unremarkable. No evidence of biliary ductal dilatation. Pancreas:  No mass or inflammatory changes. Spleen: Within normal limits in size and appearance. Adrenals/Urinary Tract: No suspicious masses identified. Moderate chronic right renal parenchymal scarring is again noted. No evidence of ureteral calculi. Mild bilateral hydroureteronephrosis has increased since previous study due to involvement by diverticulitis described below. Stomach/Bowel: Stable moderate hiatal hernia. Moderate sigmoid diverticulitis shows no significant change since prior study. A small rim enhancing gas and fluid collection in the central sigmoid mesentery currently measures 2.6 x 1.4 cm, compared to 2.3 x 1.1 cm previously. No other abscess or free fluid identified. Vascular/Lymphatic: No pathologically enlarged lymph nodes. No acute vascular findings. Reproductive:  No mass or other significant abnormality. Other:  None. Musculoskeletal:  No suspicious bone lesions identified. IMPRESSION: No significant change in moderate sigmoid diverticulitis. Slight increase in size of 2.6 cm diverticular abscess in central sigmoid mesentery. Increased mild bilateral hydroureteronephrosis due to involvement of both ureters by diverticulitis. Stable moderate hiatal hernia. Electronically Signed   By: JMarlaine HindM.D.   On: 07/15/2022 12:26    DG CHEST PORT 1 VIEW  Result Date: 07/13/2022 CLINICAL DATA:  Shortness of breath, productive cough. EXAM: PORTABLE CHEST 1 VIEW COMPARISON:  CT scan of Mar 12, 2019.  Radiograph of June 05, 2013. FINDINGS: The heart size and mediastinal contours are within normal limits. Both lungs are clear. The visualized skeletal structures are unremarkable. IMPRESSION: No active disease. Electronically Signed   By: JMarijo ConceptionM.D.   On: 07/13/2022 09:14   CT ABDOMEN PELVIS W CONTRAST  Result Date: 07/11/2022 CLINICAL DATA:  Left lower quadrant abdominal pain. EXAM: CT ABDOMEN AND PELVIS WITH CONTRAST TECHNIQUE: Multidetector CT imaging of the abdomen and pelvis was performed using the standard protocol following bolus administration of intravenous contrast. RADIATION DOSE REDUCTION: This exam was performed according to the departmental dose-optimization program which includes automated exposure control, adjustment of the mA and/or kV according to patient size and/or use of iterative reconstruction technique. CONTRAST:  716mOMNIPAQUE IOHEXOL 300 MG/ML  SOLN COMPARISON:  01/10/2019. FINDINGS: Lower chest: Chronic scarring at the left lateral lung base. Small hiatal hernia. No acute findings.  Hepatobiliary: Liver normal in overall size attenuation. Stable left lobe hemangioma. Subcentimeter low-attenuation lesion, segment 7 consistent with a cyst. No other liver abnormalities. Gallbladder is distended, otherwise unremarkable. No bile duct dilation. Pancreas: Unremarkable. No pancreatic ductal dilatation or surrounding inflammatory changes. Spleen: Normal in size without focal abnormality. Adrenals/Urinary Tract: Normal adrenal glands. Right mid to lower pole renal cortical scarring. 12 mm low-attenuation mass, lower pole the right kidney, stable consistent with a cyst. No follow-up recommended. No renal stones. No hydronephrosis. Ureters are normal in course and in caliber. Bladder is unremarkable. Stomach/Bowel:  Mild wall thickening with adjacent inflammatory stranding and haziness along the proximal to mid sigmoid colon consistent with diverticulitis. Small fluid collection lies in the mesentery adjacent to the involved segment of sigmoid colon, 1.9 x 1.2 x 1.5 cm. No extraluminal or free air. Multiple diverticula are noted along the sigmoid colon, less prominently along the descending colon. There are no other areas of colonic inflammation. Stomach unremarkable other than the hiatal hernia. Normal small bowel. Normal appendix visualized. Vascular/Lymphatic: Minor aortic atherosclerosis. No aneurysm. No enlarged lymph nodes. Reproductive: Uterus and bilateral adnexa are unremarkable. Other: No abdominal wall hernia or abnormality. No abdominopelvic ascites. Musculoskeletal: No fracture or acute finding.  No bone lesion. IMPRESSION: 1. Acute sigmoid colon diverticulitis. Small peridiverticular abscess/fluid collection, 1.9 cm in greatest dimension. No other complicating features. 2. No other acute abnormality within the abdomen or pelvis. Electronically Signed   By: Lajean Manes M.D.   On: 07/11/2022 17:09    Microbiology: Results for orders placed or performed during the hospital encounter of 07/11/22  Urine Culture     Status: Abnormal   Collection Time: 07/11/22  2:46 PM   Specimen: Urine, Clean Catch  Result Value Ref Range Status   Specimen Description   Final    URINE, CLEAN CATCH Performed at [facility], [place] [address] [place], [place] [address]    Special Requests   Final    NONE Performed at [facility], [place] [address], [place], [place] [address]    Culture >=100,000 COLONIES/mL KLEBSIELLA PNEUMONIAE (A)  Final   Report Status 07/13/2022 FINAL  Final   Organism ID, Bacteria KLEBSIELLA PNEUMONIAE (A)  Final      Susceptibility   Klebsiella pneumoniae - MIC*    AMPICILLIN RESISTANT Resistant     CEFAZOLIN <=4 SENSITIVE Sensitive     CEFEPIME <=0.12  SENSITIVE Sensitive     CEFTRIAXONE <=0.25 SENSITIVE Sensitive     CIPROFLOXACIN <=0.25 SENSITIVE Sensitive     GENTAMICIN <=1 SENSITIVE Sensitive     IMIPENEM <=0.25 SENSITIVE Sensitive     NITROFURANTOIN 64 INTERMEDIATE Intermediate     TRIMETH/SULFA <=20 SENSITIVE Sensitive     AMPICILLIN/SULBACTAM 4 SENSITIVE Sensitive     PIP/TAZO <=4 SENSITIVE Sensitive     * >=100,000 COLONIES/mL KLEBSIELLA PNEUMONIAE  C Difficile Quick Screen (NO PCR Reflex)     Status: Abnormal   Collection Time: 07/17/22 12:52 PM   Specimen: STOOL  Result Value Ref Range Status   C Diff antigen POSITIVE (A) NEGATIVE Final   C Diff toxin NEGATIVE NEGATIVE Final   C Diff interpretation   Final    Results are indeterminate. Please contact the provider listed for your campus for C diff questions in [place].    Comment: Performed at [facility], [place] [address] [place], [place] [address]    Labs: CBC: Recent Labs  Lab 07/22/22 0900 07/24/22 0609  WBC 8.2  8.8  NEUTROABS  --  5.6  HGB 12.9 11.7*  HCT 40.9 36.6  MCV 93.4 92.9  PLT 319 718   Basic Metabolic Panel: Recent Labs  Lab 07/24/22 0609  NA 141  K 4.0  CL 107  CO2 27  GLUCOSE 89  BUN 17  CREATININE 0.96  CALCIUM 8.9  MG 2.3  PHOS 3.6   Liver Function Tests: Recent Labs  Lab 07/24/22 0609  AST 18  ALT 19  ALKPHOS 57  BILITOT 0.7  PROT 6.4*  ALBUMIN 3.2*   CBG: No results for input(s): "GLUCAP" in the last 168 hours.  Discharge time spent: approximately 35 minutes spent on discharge counseling, evaluation of patient on day of discharge, and coordination of discharge planning with nursing, social work, pharmacy and case management  Signed: Edwin Dada, MD [facility] 07/27/2022

## 2022-08-08 ENCOUNTER — Other Ambulatory Visit: Payer: Self-pay

## 2022-08-08 ENCOUNTER — Ambulatory Visit (INDEPENDENT_AMBULATORY_CARE_PROVIDER_SITE_OTHER): Payer: Medicare Other | Admitting: Infectious Diseases

## 2022-08-08 ENCOUNTER — Encounter: Payer: Self-pay | Admitting: Infectious Diseases

## 2022-08-08 VITALS — BP 129/85 | HR 76 | Temp 97.9°F | Wt 157.0 lb

## 2022-08-08 DIAGNOSIS — Z5181 Encounter for therapeutic drug level monitoring: Secondary | ICD-10-CM

## 2022-08-08 DIAGNOSIS — Z221 Carrier of other intestinal infectious diseases: Secondary | ICD-10-CM

## 2022-08-08 DIAGNOSIS — K5720 Diverticulitis of large intestine with perforation and abscess without bleeding: Secondary | ICD-10-CM

## 2022-08-08 MED ORDER — AMOXICILLIN-POT CLAVULANATE 875-125 MG PO TABS: 1.0000 | ORAL_TABLET | Freq: Two times a day (BID) | ORAL | 0 refills | Status: AC

## 2022-08-08 NOTE — Progress Notes (Unsigned)
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   AMOXICILLIN-CLAVULANATE (AUGMENTIN) 875-125 MG TABLET    Take 1 tablet by mouth every 12 (twelve) hours for 21 days.   CALCIUM CARBONATE ANTACID 400 MG CHEW    Chew 1 tablet by mouth daily.   FEEDING SUPPLEMENT (ENSURE ENLIVE / ENSURE PLUS) LIQD    Take 237 mLs by mouth 3 (three) times daily with meals.   FIDAXOMICIN (DIFICID) 200 MG TABS TABLET    Take 1 tablet (200 mg total) by mouth every other day.   FOLIC ACID (FOLVITE) 297 MCG TABLET    Take 400 mcg by mouth daily.   PREDNISONE (DELTASONE) 1 MG TABLET    Take 2 mg by mouth daily.   ROSUVASTATIN (CRESTOR) 10 MG TABLET    Take 10 mg by mouth daily.  Modified Medications   No medications on file  Discontinued Medications   No medications on file    Subjective: 71 year old female with PMH of carotid artery stenosis, chronic back pain, PMR,  diverticulitis, hyperlipidemia, hypertension who is here for HFU for diverticular abscess. She is taking augmentin twice a day and fidaxomicin every alternate day. She is accompanied by her husband. Left Lower quadrant pain is usually 6-7/10 but  10/10 today. Denies fevers, chills, sweats. Denies nausea, vomiting, diarrhea. She had diarrhea last Friday that has resolved. Appetite is poor, She has a fu with her surgeon 10/19.   Review of Systems: all systems reviewed with  pertinent positives and negatives as listed above  Past Medical History:  Diagnosis Date   Carotid artery stenosis    Chronic back pain    Diverticulitis    Hyperlipidemia    Hypertension    Numbness and tingling    hands bilat and lower legs bilat comes and goes    UTI (lower urinary tract infection)    Past Surgical History:  Procedure Laterality Date   SHOULDER OPEN ROTATOR CUFF REPAIR Left 11/10/2015   Procedure: LEFT SHOULDER MINI OPEN ROTATOR CUFF REPAIR AND DISTAL CLAVICAL RESECTION;  Surgeon: Susa Day, MD;  Location: [facility];  Service: Orthopedics;  Laterality: Left;    Social History   Tobacco Use   Smoking status: Never   Smokeless tobacco: Never  Vaping Use   Vaping Use: Never used  Substance Use Topics   Alcohol use: No   Drug use: No    Family History  Problem Relation Age of Onset   Cancer Mother    Cancer Father     No Known Allergies  Health Maintenance  Topic Date Due   Hepatitis C Screening  Never done   TETANUS/TDAP  Never done   Zoster Vaccines- Shingrix (1 of 2) Never done   Pneumonia Vaccine 14+ Years old (1 - PCV) Never done   COVID-19 Vaccine (2 - Janssen risk series) 03/09/2020   COLONOSCOPY (  Pts 45-38yr Insurance coverage will need to be confirmed)  04/07/2022   INFLUENZA VACCINE  06/05/2022   MAMMOGRAM  01/31/2024   DEXA SCAN  Completed   HPV VACCINES  Aged Out    Objective: BP 129/85   Pulse 76   Temp 97.9 F (36.6 C) (Oral)   Wt 157 lb (71.2 kg)   SpO2 95%   BMI 27.81 kg/m    Physical Exam Constitutional:      Appearance: Normal appearance.  HENT:     Head: Normocephalic and atraumatic.      Mouth: Mucous membranes are moist.  Eyes:    Conjunctiva/sclera: Conjunctivae normal.     Pupils:  Cardiovascular:     Rate and Rhythm: Normal rate and regular rhythm.     Heart sounds:  Pulmonary:     Effort: Pulmonary effort is normal.     Breath sounds: Normal breath sounds.   Abdominal:     General: Non  distended     Palpations: soft. Tender at the LLQ but no RT, guarding, BS+  Musculoskeletal:        General: Normal range of motion.   Skin:    General: Skin is warm and dry.     Comments:  Neurological:     General: grossly non focal     Mental Status: awake, alert and oriented to person, place, and time.   Psychiatric:        Mood and Affect: Mood normal.   Lab Results Lab Results  Component Value Date   WBC 8.8 07/24/2022   HGB 11.7 (L) 07/24/2022   HCT 36.6 07/24/2022   MCV 92.9 07/24/2022   PLT 275 07/24/2022    Lab Results  Component Value Date   CREATININE 0.96 07/24/2022   BUN 17 07/24/2022   NA 141 07/24/2022   K 4.0 07/24/2022   CL 107 07/24/2022   CO2 27 07/24/2022    Lab Results  Component Value Date   ALT 19 07/24/2022   AST 18 07/24/2022   ALKPHOS 57 07/24/2022   BILITOT 0.7 07/24/2022    Lab Results  Component Value Date   TRIG 91 07/19/2022   No results found for: "LABRPR", "RPRTITER" No results found for: "HIV1RNAQUANT", "HIV1RNAVL", "CD4TABS"   Assessment # Diverticular abscess in sigmoid mesentery in the setting of diverticulitis   # C diff ag positive but toxin negative with nosocomial diarrhea in the setting of abtx use- diarrhea resolved    # PMR - on simponi, MTX and prednisone previously, currently on prednisone 2 mg daily   Plan Continue PO augmentin po bid for 2 more weeks  CT abdomen/pelvis w contrast stat Labs today Fu in 2-3 weeks after CT to decide further need for  abtx Can continue PO fidaxomicin in alternate days for ppx Advised to go to ED in case of persistent/worsening abdominal pain, fevers, nausea, vomiting etc   I have personally spent 42  minutes involved in face-to-face and non-face-to-face activities for this patient on the day of the visit. Professional time spent includes the following activities: Preparing to see the patient (review of tests), Obtaining and/or reviewing separately obtained history  (admission/discharge record), Performing a medically appropriate examination and/or evaluation , Ordering medications/tests/procedures, referring and communicating with other health care professionals, Documenting clinical information in the EMR, Independently interpreting results (not separately reported), Communicating results to the patient/family/caregiver, Counseling and educating the patient/family/caregiver and Care coordination (not separately reported).   SWilber Oliphant [facility]  [facility] 08/08/2022, 8:42 AM

## 2022-08-09 DIAGNOSIS — R1032 Left lower quadrant pain: Secondary | ICD-10-CM | POA: Diagnosis not present

## 2022-08-09 DIAGNOSIS — K5792 Diverticulitis of intestine, part unspecified, without perforation or abscess without bleeding: Secondary | ICD-10-CM | POA: Diagnosis not present

## 2022-08-09 DIAGNOSIS — Z09 Encounter for follow-up examination after completed treatment for conditions other than malignant neoplasm: Secondary | ICD-10-CM | POA: Diagnosis not present

## 2022-08-09 DIAGNOSIS — K630 Abscess of intestine: Secondary | ICD-10-CM | POA: Diagnosis not present

## 2022-08-09 DIAGNOSIS — Z23 Encounter for immunization: Secondary | ICD-10-CM | POA: Diagnosis not present

## 2022-08-09 DIAGNOSIS — Z5181 Encounter for therapeutic drug level monitoring: Secondary | ICD-10-CM | POA: Insufficient documentation

## 2022-08-09 LAB — CBC
HCT: 38.5 % (ref 35.0–45.0)
Hemoglobin: 12.7 g/dL (ref 11.7–15.5)
MCH: 29.3 pg (ref 27.0–33.0)
MCHC: 33 g/dL (ref 32.0–36.0)
MCV: 88.9 fL (ref 80.0–100.0)
MPV: 11.6 fL (ref 7.5–12.5)
Platelets: 266 10*3/uL (ref 140–400)
RBC: 4.33 10*6/uL (ref 3.80–5.10)
RDW: 13.9 % (ref 11.0–15.0)
WBC: 9.9 10*3/uL (ref 3.8–10.8)

## 2022-08-09 LAB — COMPREHENSIVE METABOLIC PANEL
AG Ratio: 1.5 (calc) (ref 1.0–2.5)
ALT: 13 U/L (ref 6–29)
AST: 13 U/L (ref 10–35)
Albumin: 4 g/dL (ref 3.6–5.1)
Alkaline phosphatase (APISO): 66 U/L (ref 37–153)
BUN/Creatinine Ratio: 12 (calc) (ref 6–22)
BUN: 12 mg/dL (ref 7–25)
CO2: 26 mmol/L (ref 20–32)
Calcium: 9.2 mg/dL (ref 8.6–10.4)
Chloride: 105 mmol/L (ref 98–110)
Creat: 1.01 mg/dL — ABNORMAL HIGH (ref 0.60–1.00)
Globulin: 2.6 g/dL (calc) (ref 1.9–3.7)
Glucose, Bld: 86 mg/dL (ref 65–99)
Potassium: 4.4 mmol/L (ref 3.5–5.3)
Sodium: 141 mmol/L (ref 135–146)
Total Bilirubin: 0.3 mg/dL (ref 0.2–1.2)
Total Protein: 6.6 g/dL (ref 6.1–8.1)

## 2022-08-09 LAB — SEDIMENTATION RATE: Sed Rate: 38 mm/h — ABNORMAL HIGH (ref 0–30)

## 2022-08-09 LAB — C-REACTIVE PROTEIN: CRP: 53.7 mg/L — ABNORMAL HIGH (ref <8.0)

## 2022-08-15 ENCOUNTER — Ambulatory Visit (HOSPITAL_COMMUNITY)
Admission: RE | Admit: 2022-08-15 | Discharge: 2022-08-15 | Disposition: A | Discharge status: Home / Self Care | Payer: Medicare Other | Source: Ambulatory Visit | Attending: Infectious Diseases | Admitting: Infectious Diseases

## 2022-08-15 DIAGNOSIS — K5720 Diverticulitis of large intestine with perforation and abscess without bleeding: Secondary | ICD-10-CM | POA: Diagnosis not present

## 2022-08-15 DIAGNOSIS — K5732 Diverticulitis of large intestine without perforation or abscess without bleeding: Secondary | ICD-10-CM | POA: Diagnosis not present

## 2022-08-15 MED ORDER — IOHEXOL 350 MG/ML SOLN
100.0000 mL | Freq: Once | INTRAVENOUS | Status: AC | PRN
  Administered 2022-08-15: 75 mL via INTRAVENOUS

## 2022-08-23 DIAGNOSIS — K5720 Diverticulitis of large intestine with perforation and abscess without bleeding: Secondary | ICD-10-CM | POA: Diagnosis not present

## 2022-08-29 ENCOUNTER — Encounter: Payer: Self-pay | Admitting: Surgery

## 2022-08-29 ENCOUNTER — Encounter: Payer: Self-pay | Admitting: Internal Medicine

## 2022-08-30 ENCOUNTER — Telehealth: Payer: Self-pay | Admitting: Internal Medicine

## 2022-08-30 ENCOUNTER — Other Ambulatory Visit: Payer: Self-pay | Admitting: Urology

## 2022-08-30 NOTE — Telephone Encounter (Signed)
Inbound call from patient in regards to her upcoming ov on 10/11/22 with provider. Patient is also schedule with Dr Dema Severin on 12/8 for a procedure. Please advise.

## 2022-08-31 ENCOUNTER — Other Ambulatory Visit: Payer: Self-pay

## 2022-08-31 ENCOUNTER — Ambulatory Visit (INDEPENDENT_AMBULATORY_CARE_PROVIDER_SITE_OTHER): Payer: Medicare Other | Admitting: Infectious Diseases

## 2022-08-31 ENCOUNTER — Encounter: Payer: Self-pay | Admitting: Infectious Diseases

## 2022-08-31 VITALS — BP 141/82 | HR 73 | Temp 97.7°F | Ht 63.0 in | Wt 157.0 lb

## 2022-08-31 DIAGNOSIS — Z5181 Encounter for therapeutic drug level monitoring: Secondary | ICD-10-CM | POA: Diagnosis not present

## 2022-08-31 DIAGNOSIS — K5720 Diverticulitis of large intestine with perforation and abscess without bleeding: Secondary | ICD-10-CM

## 2022-08-31 DIAGNOSIS — M353 Polymyalgia rheumatica: Secondary | ICD-10-CM | POA: Diagnosis not present

## 2022-08-31 NOTE — Telephone Encounter (Signed)
Okay to move patient to an earlier appointment using a follow up time slot. Spoke with [name]. Agrees to bring the patient 09/13/22 at 9:30 arriving 10 to 15 minutes prior to the appointment.

## 2022-08-31 NOTE — Progress Notes (Addendum)
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   CALCIUM CARBONATE ANTACID 400 MG CHEW    Chew 1 tablet by mouth daily.   FEEDING SUPPLEMENT (ENSURE ENLIVE / ENSURE PLUS) LIQD    Take 237 mLs by mouth 3 (three) times daily with meals.   FIDAXOMICIN (DIFICID) 200 MG TABS TABLET    Take 1 tablet (200 mg total) by mouth every other day.   FOLIC ACID (FOLVITE) 440 MCG TABLET    Take 400 mcg by mouth daily.   PREDNISONE (DELTASONE) 1 MG TABLET    Take 2 mg by mouth daily.   ROSUVASTATIN (CRESTOR) 10 MG TABLET    Take 10 mg by mouth daily.  Modified Medications   No medications on file  Discontinued Medications   No medications on file    Subjective: 71 year old female with PMH of carotid artery stenosis, chronic back pain, PMR,  diverticulitis, hyperlipidemia, hypertension who is here for HFU for diverticular abscess. CT abdomen/pelvis 10/11 with findings as below. She completed her antibiotics 2 days ago and has been doing well. Abdominal pain has significantly improved from when admitted.  Denies abdominal pain in the last 2 days. She has mild LLQ abdominal pain when she eats otherwise, does not have pain. Denies any diarrhea. Stool is soft.  Denies fevers, chills, nausea, and vomiting.   Seen by surgery Dr Dema Severin 10/19 and she is scheduled to OR on 12/8 but she needs pre  operative colonoscopy. They are working to have it scheduled but having some trouble with scheduling. I did contact Dr Right's office and informed about their concerns per their request.   She is also having increasing stiffness in her bilateral thighs as he simponi has been on hold due to being on antibiotics. She feels like her PMR is starting to flare up and she needs simponi asap and would be visiting with her doctor soon.   Review of Systems: all systems reviewed with pertinent positives and negatives as listed above  Past Medical History:  Diagnosis Date   Carotid artery stenosis    Chronic back pain    Diverticulitis    Hyperlipidemia    Hypertension    Numbness and tingling    hands bilat and lower legs bilat comes and goes    UTI (lower urinary tract infection)    Past Surgical History:  Procedure Laterality Date   SHOULDER OPEN ROTATOR CUFF REPAIR Left 11/10/2015   Procedure: LEFT SHOULDER MINI OPEN ROTATOR CUFF REPAIR AND DISTAL CLAVICAL RESECTION;  Surgeon: Susa Day, MD;  Location: [facility];  Service: Orthopedics;  Laterality: Left;    Social History   Tobacco Use   Smoking status: Never   Smokeless tobacco: Never  Vaping Use   Vaping Use: Never used  Substance Use Topics   Alcohol use: No  Drug use: No    Family History  Problem Relation Age of Onset   Cancer Mother    Cancer Father     No Known Allergies  Health Maintenance  Topic Date Due   Hepatitis C Screening  Never done   TETANUS/TDAP  Never done   Zoster Vaccines- Shingrix (1 of 2) Never done   Pneumonia Vaccine 52+ Years old (1 - PCV) Never done   COVID-19 Vaccine (3 - Janssen risk series) 12/21/2021   COLONOSCOPY (Pts 45-44yr Insurance coverage will need to be confirmed)  04/07/2022   INFLUENZA VACCINE  06/05/2022   Medicare Annual Wellness (AWV)  06/29/2022   MAMMOGRAM  01/31/2024   DEXA SCAN  Completed   HPV VACCINES  Aged Out    Objective: BP (!) 141/82   Pulse 73   Temp 97.7 F  (36.5 C) (Temporal)   Ht '5\' 3"'$  (1.6 m)   Wt 157 lb (71.2 kg)   BMI 27.81 kg/m     Physical Exam Constitutional:      Appearance: Normal appearance. Tired  HENT:     Head: Normocephalic and atraumatic.      Mouth: Mucous membranes are moist.  Eyes:    Conjunctiva/sclera: Conjunctivae normal.     Pupils:  Cardiovascular:     Rate and Rhythm: Normal rate and regular rhythm.     Heart sounds:  Pulmonary:     Effort: Pulmonary effort is normal.     Breath sounds: Normal breath sounds.   Abdominal:     General: Non distended     Palpations: soft. Mild tenderness at the LLQ but no RT, guarding, BS+  Musculoskeletal:        General: Normal range of motion.   Skin:    General: Skin is warm and dry.     Comments:  Neurological:     General: grossly non focal     Mental Status: awake, alert and oriented to person, place, and time.   Psychiatric:        Mood and Affect: Mood normal.   Lab Results Lab Results  Component Value Date   WBC 9.9 08/08/2022   HGB 12.7 08/08/2022   HCT 38.5 08/08/2022   MCV 88.9 08/08/2022   PLT 266 08/08/2022    Lab Results  Component Value Date   CREATININE 1.01 (H) 08/08/2022   BUN 12 08/08/2022   NA 141 08/08/2022   K 4.4 08/08/2022   CL 105 08/08/2022   CO2 26 08/08/2022    Lab Results  Component Value Date   ALT 13 08/08/2022   AST 13 08/08/2022   ALKPHOS 57 07/24/2022   BILITOT 0.3 08/08/2022    Lab Results  Component Value Date   TRIG 91 07/19/2022   No results found for: "LABRPR", "RPRTITER" No results found for: "HIV1RNAQUANT", "HIV1RNAVL", "CD4TABS"   Imaging CT ABDOMEN PELVIS W CONTRAST  Result Date: 08/15/2022 CLINICAL DATA:  71year old female with small sigmoid diverticular abscess last month. Subsequent encounter. EXAM: CT ABDOMEN AND PELVIS WITH CONTRAST TECHNIQUE: Multidetector CT imaging of the abdomen and pelvis was performed using the standard protocol following bolus administration of intravenous  contrast. RADIATION DOSE REDUCTION: This exam was performed according to the departmental dose-optimization program which includes automated exposure control, adjustment of the mA and/or kV according to patient size and/or use of iterative reconstruction technique. CONTRAST:  770mOMNIPAQUE IOHEXOL 350 MG/ML SOLN COMPARISON:  CT Abdomen and Pelvis 07/23/2022 and earlier. FINDINGS: Lower chest: Small to moderate gastric  hiatal hernia is more apparent than on prior exams. No cardiomegaly. No pericardial or pleural effusion. Negative lung bases. Hepatobiliary: Stable liver with benign appearing left lobe M angioma. Small right inferior hepatic lobe cyst suspected on series 3, image 24 and stable (no follow-up imaging recommended). Negative gallbladder. Pancreas: Negative. Spleen: Negative. Adrenals/Urinary Tract: Negative adrenal glands. Chronic right renal cortical scarring and atrophy. Nonobstructed kidneys with symmetric enhancement and contrast excretion. Left ureter appears mostly unaffected by the abnormal sigmoid colon and mesentery. But there does appear to be mild secondary distortion on the bladder fundus related to the abnormal mesentery now on series 7, image 111. No gas within the bladder or other bladder wall thickening. Stomach/Bowel: Oral contrast has reached the rectum without obstruction. Continued abnormal sigmoid colon with abnormal bowel wall thickening superimposed on extensive diverticulosis beginning at the junction of the descending and sigmoid (coronal image 58 up to 12 mm thick) and continuing through the mid sigmoid colon. The distal sigmoid has a more normal appearance. This bowel inflammation is only mildly improved since 07/23/2022. Associated distortion in the central sigmoid mesentery with a spiculated appearance, and involvement of the dorsal uterus (see below), but no drainable fluid or extraluminal gas. Upstream descending colon with extensive diverticula is nondilated. No other  active inflammation. Negative transverse and right colon. Negative retrocecal appendix. No dilated or inflamed small bowel. Negative stomach except for hiatal hernia. Negative duodenum. No free air. No free fluid in the abdomen. Vascular/Lymphatic: Mildly tortuous aorta. Mild Calcified aortic atherosclerosis. Major arterial structures remain patent. No lymphadenopathy identified. Portal venous system is patent. Reproductive: Sigmoid mesentery inflammation is inseparable from the dorsal uterus which is distorted (sagittal image 107) although not significantly changed from 07/23/2022. There is no gas within the uterus or vagina to indicate a fistula. The left adnexa also remains inseparable from the abnormal sigmoid. Right ovary remains within normal limits. Other: Trace free fluid with simple fluid density on coronal image 99. Musculoskeletal: Lumbar spine degeneration. No acute osseous abnormality identified. IMPRESSION: 1. Complicated sigmoid Diverticulitis with unresolved sigmoid inflammation (perhaps mildly improved since 07/23/2022). Secondary involvement of and architectural distortion on both the bladder fundus and dorsal uterus, but no evidence of fistula. No abscess or drainable fluid now. Trace pelvic free fluid is likely reactive. 2. No bowel obstruction or new abnormality in the abdomen or pelvis. Small to moderate gastric hiatal hernia. Chronic right renal cortical scarring and atrophy. Mild calcified aortic atherosclerosis. Electronically Signed   By: Genevie Ann M.D.   On: 08/15/2022 09:36    Assessment # Diverticular abscess in sigmoid mesentery in the setting of diverticulitis CT 10.11 with overall improved findings, still has Complicated sigmoid Diverticulitis with unresolved sigmoid inflammation (perhaps mildly improved since 07/23/2022). Secondary involvement of and architectural distortion on both the bladder fundus and dorsal uterus, but no evidence of fistula. No abscess or drainable fluid  now.  Off augmentin for 2 days and doing well, will monitor off abtx  Labs today  Fu with surgery for definitive management (robotic assisted sigmoidectomy/low anterior resection, flexible sigmoidoscopy, cystoscopy/ureteral ICG by [facility] concurrently) as well as GI for pre op colonoscopy as planned Fu with me in a week to monitor her symptoms off abtx  Have discussed with patient to go to ED with fevers, chills, worsening abdominal pain, diarrhea etc to seek immediate attention   # PMR - on simponi, MTX and prednisone previously, currently on prednisone 2 mg daily. She will be following with her provider regarding resuming  simponi as she is starting to have flare of her PMR. I have discussed with patient to inform about her current treatment status for diverticulitis and assess the risks vs benefit of restaring simponi.   I have personally spent 42  minutes involved in face-to-face and non-face-to-face activities for this patient on the day of the visit including counseling of the patient and coordination of care.   Wilber Oliphant, [place] [facility] 08/31/2022, 10:43 AM

## 2022-08-31 NOTE — Telephone Encounter (Signed)
Patient is having a sigmoidectomy with low anterior resection with Dr Dema Severin on 10/12/22. Should she still establish with you the day before or see you after she is released from the surgeon?

## 2022-08-31 NOTE — Telephone Encounter (Signed)
See Care Everywhere note from Dr Dema Severin.

## 2022-09-01 LAB — CBC
HCT: 37.7 % (ref 35.0–45.0)
Hemoglobin: 12.1 g/dL (ref 11.7–15.5)
MCH: 28.9 pg (ref 27.0–33.0)
MCHC: 32.1 g/dL (ref 32.0–36.0)
MCV: 90 fL (ref 80.0–100.0)
MPV: 11.7 fL (ref 7.5–12.5)
Platelets: 272 10*3/uL (ref 140–400)
RBC: 4.19 10*6/uL (ref 3.80–5.10)
RDW: 13.9 % (ref 11.0–15.0)
WBC: 9.9 10*3/uL (ref 3.8–10.8)

## 2022-09-01 LAB — COMPREHENSIVE METABOLIC PANEL
AG Ratio: 1.6 (calc) (ref 1.0–2.5)
ALT: 10 U/L (ref 6–29)
AST: 9 U/L — ABNORMAL LOW (ref 10–35)
Albumin: 4.2 g/dL (ref 3.6–5.1)
Alkaline phosphatase (APISO): 62 U/L (ref 37–153)
BUN: 13 mg/dL (ref 7–25)
CO2: 28 mmol/L (ref 20–32)
Calcium: 9.6 mg/dL (ref 8.6–10.4)
Chloride: 104 mmol/L (ref 98–110)
Creat: 0.93 mg/dL (ref 0.60–1.00)
Globulin: 2.7 g/dL (calc) (ref 1.9–3.7)
Glucose, Bld: 89 mg/dL (ref 65–99)
Potassium: 4.4 mmol/L (ref 3.5–5.3)
Sodium: 142 mmol/L (ref 135–146)
Total Bilirubin: 0.5 mg/dL (ref 0.2–1.2)
Total Protein: 6.9 g/dL (ref 6.1–8.1)

## 2022-09-01 LAB — SEDIMENTATION RATE: Sed Rate: 34 mm/h — ABNORMAL HIGH (ref 0–30)

## 2022-09-01 LAB — C-REACTIVE PROTEIN: CRP: 48.2 mg/L — ABNORMAL HIGH (ref <8.0)

## 2022-09-04 DIAGNOSIS — M0609 Rheumatoid arthritis without rheumatoid factor, multiple sites: Secondary | ICD-10-CM | POA: Diagnosis not present

## 2022-09-04 DIAGNOSIS — R5383 Other fatigue: Secondary | ICD-10-CM | POA: Diagnosis not present

## 2022-09-04 DIAGNOSIS — Z111 Encounter for screening for respiratory tuberculosis: Secondary | ICD-10-CM | POA: Diagnosis not present

## 2022-09-04 DIAGNOSIS — Z79899 Other long term (current) drug therapy: Secondary | ICD-10-CM | POA: Diagnosis not present

## 2022-09-07 ENCOUNTER — Encounter: Payer: Self-pay | Admitting: Infectious Diseases

## 2022-09-07 ENCOUNTER — Ambulatory Visit (INDEPENDENT_AMBULATORY_CARE_PROVIDER_SITE_OTHER): Payer: Medicare Other | Admitting: Infectious Diseases

## 2022-09-07 ENCOUNTER — Other Ambulatory Visit: Payer: Self-pay

## 2022-09-07 VITALS — BP 120/78 | HR 60 | Temp 97.5°F | Ht 63.0 in | Wt 160.0 lb

## 2022-09-07 DIAGNOSIS — Z5181 Encounter for therapeutic drug level monitoring: Secondary | ICD-10-CM | POA: Diagnosis not present

## 2022-09-07 DIAGNOSIS — K5720 Diverticulitis of large intestine with perforation and abscess without bleeding: Secondary | ICD-10-CM

## 2022-09-07 DIAGNOSIS — Z221 Carrier of other intestinal infectious diseases: Secondary | ICD-10-CM | POA: Diagnosis not present

## 2022-09-07 NOTE — Progress Notes (Addendum)
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   CALCIUM CARBONATE ANTACID 400 MG CHEW    Chew 1 tablet by mouth daily.   FEEDING SUPPLEMENT (ENSURE ENLIVE / ENSURE PLUS) LIQD    Take 237 mLs by mouth 3 (three) times daily with meals.   FOLIC ACID (FOLVITE) 209 MCG TABLET    Take 400 mcg by mouth daily.   PREDNISONE (DELTASONE) 1 MG TABLET    Take 2 mg by mouth daily.   ROSUVASTATIN (CRESTOR) 10 MG TABLET    Take 10 mg by mouth daily.  Modified Medications   No medications on file  Discontinued Medications   No medications on file    Subjective: 71 year old female with PMH of carotid artery stenosis, chronic back pain, PMR,  diverticulitis, hyperlipidemia, hypertension who is here for HFU for diverticular abscess. CT abdomen/pelvis 10/11 with improved findings.   She is accompanied by her husband today. She continues to remain off abtx since last clinic visit.  Denies fevers, chills, sweats. Denies nausea, vomiting and diarrhea. Abdominal pain has resolved. Appetite is also improving. She is scheduled to see GI Nov 9 for scheduling colonoscopy. She has lot of questions about her up coming surgery on 12/8 regarding colostomy bag. I tried to answer the questions to the best of my ability but recommended to discuss with her surgeon regarding any concerns she might have  regarding her surgery and post op care. She is doing well otherwise with no other concerns.   Review of Systems: all systems reviewed with pertinent positives and negatives as listed above  Past Medical History:  Diagnosis Date   Carotid artery stenosis    Chronic back pain    Diverticulitis    Hyperlipidemia    Hypertension    Numbness and tingling    hands bilat and lower legs bilat comes and goes    UTI (lower urinary tract infection)    Past Surgical History:  Procedure Laterality Date   SHOULDER OPEN ROTATOR CUFF REPAIR Left 11/10/2015   Procedure: LEFT SHOULDER MINI OPEN ROTATOR CUFF REPAIR AND DISTAL CLAVICAL RESECTION;  Surgeon: Susa Day, MD;  Location: [facility];  Service: Orthopedics;  Laterality: Left;    Social History   Tobacco Use   Smoking status: Never   Smokeless tobacco: Never  Vaping Use   Vaping Use: Never used  Substance Use Topics   Alcohol use: No   Drug use: No    Family History  Problem Relation Age of Onset   Cancer Mother    Cancer Father     No Known Allergies  Health Maintenance  Topic Date Due   Hepatitis C Screening  Never done   TETANUS/TDAP  Never done   Zoster Vaccines- Shingrix (1 of 2) Never done   Pneumonia Vaccine 58+ Years old (39 -  PCV) 07/29/2017   COVID-19 Vaccine (2 - Janssen risk series) 03/09/2020   COLONOSCOPY (Pts 45-70yr Insurance coverage will need to be confirmed)  04/07/2022   INFLUENZA VACCINE  06/05/2022   Medicare Annual Wellness (AWV)  06/29/2022   MAMMOGRAM  01/31/2024   DEXA SCAN  Completed   HPV VACCINES  Aged Out    Objective: BP 120/78   Pulse 60   Temp (!) 97.5 F (36.4 C) (Oral)   Ht _0  (1.6 m)   Wt 160 lb (72.6 kg)   SpO2 99%   BMI 28.34 kg/m   Physical Exam Constitutional:      Appearance: Normal appearance. Appears energetic  HENT:     Head: Normocephalic and atraumatic.      Mouth: Mucous membranes are moist.  Eyes:    Conjunctiva/sclera: Conjunctivae normal.      Pupils:  Cardiovascular:     Rate and Rhythm: Normal rate and regular rhythm.     Heart sounds:  Pulmonary:     Effort: Pulmonary effort is normal.     Breath sounds: Normal breath sounds.   Abdominal:     General: Non distended     Palpations: soft. No  tenderness at all quadrants, BS+  Musculoskeletal:        General: Normal range of motion.   Skin:    General: Skin is warm and dry.     Comments:  Neurological:     General: grossly non focal     Mental Status: awake, alert and oriented to person, place, and time.   Psychiatric:        Mood and Affect: Mood normal.   Lab Results Lab Results  Component Value Date   WBC 9.9 08/31/2022   HGB 12.1 08/31/2022   HCT 37.7 08/31/2022   MCV 90.0 08/31/2022   PLT 272 08/31/2022    Lab Results  Component Value Date   CREATININE 0.93 08/31/2022   BUN 13 08/31/2022   NA 142 08/31/2022   K 4.4 08/31/2022   CL 104 08/31/2022   CO2 28 08/31/2022    Lab Results  Component Value Date   ALT 10 08/31/2022   AST 9 (L) 08/31/2022   ALKPHOS 57 07/24/2022   BILITOT 0.5 08/31/2022    Lab Results  Component Value Date   TRIG 91 07/19/2022   No results found for: "LABRPR", "RPRTITER" No results found for: "HIV1RNAQUANT", "HIV1RNAVL", "CD4TABS"   Imaging CT ABDOMEN PELVIS W CONTRAST  Result Date: 08/15/2022 CLINICAL DATA:  72year old female with small sigmoid diverticular abscess last month. Subsequent encounter. EXAM: CT ABDOMEN AND PELVIS WITH CONTRAST TECHNIQUE: Multidetector CT imaging of the abdomen and pelvis was performed using the standard protocol following bolus administration of intravenous contrast. RADIATION DOSE REDUCTION: This exam was performed according to the departmental dose-optimization program which includes automated exposure control, adjustment of the mA and/or kV according to patient size and/or use of iterative reconstruction technique. CONTRAST:  766mOMNIPAQUE IOHEXOL 350 MG/ML SOLN COMPARISON:  CT  Abdomen and Pelvis 07/23/2022 and earlier. FINDINGS: Lower chest: Small to moderate gastric hiatal hernia is more apparent than on prior exams. No cardiomegaly. No pericardial or pleural effusion. Negative lung bases. Hepatobiliary: Stable liver with benign appearing left lobe M angioma. Small right inferior hepatic lobe cyst suspected on series 3, image 24 and stable (no follow-up imaging recommended). Negative gallbladder. Pancreas: Negative. Spleen: Negative. Adrenals/Urinary Tract: Negative adrenal glands. Chronic right renal cortical scarring and atrophy. Nonobstructed kidneys with symmetric enhancement and  contrast excretion. Left ureter appears mostly unaffected by the abnormal sigmoid colon and mesentery. But there does appear to be mild secondary distortion on the bladder fundus related to the abnormal mesentery now on series 7, image 111. No gas within the bladder or other bladder wall thickening. Stomach/Bowel: Oral contrast has reached the rectum without obstruction. Continued abnormal sigmoid colon with abnormal bowel wall thickening superimposed on extensive diverticulosis beginning at the junction of the descending and sigmoid (coronal image 58 up to 12 mm thick) and continuing through the mid sigmoid colon. The distal sigmoid has a more normal appearance. This bowel inflammation is only mildly improved since 07/23/2022. Associated distortion in the central sigmoid mesentery with a spiculated appearance, and involvement of the dorsal uterus (see below), but no drainable fluid or extraluminal gas. Upstream descending colon with extensive diverticula is nondilated. No other active inflammation. Negative transverse and right colon. Negative retrocecal appendix. No dilated or inflamed small bowel. Negative stomach except for hiatal hernia. Negative duodenum. No free air. No free fluid in the abdomen. Vascular/Lymphatic: Mildly tortuous aorta. Mild Calcified aortic atherosclerosis. Major arterial  structures remain patent. No lymphadenopathy identified. Portal venous system is patent. Reproductive: Sigmoid mesentery inflammation is inseparable from the dorsal uterus which is distorted (sagittal image 107) although not significantly changed from 07/23/2022. There is no gas within the uterus or vagina to indicate a fistula. The left adnexa also remains inseparable from the abnormal sigmoid. Right ovary remains within normal limits. Other: Trace free fluid with simple fluid density on coronal image 99. Musculoskeletal: Lumbar spine degeneration. No acute osseous abnormality identified. IMPRESSION: 1. Complicated sigmoid Diverticulitis with unresolved sigmoid inflammation (perhaps mildly improved since 07/23/2022). Secondary involvement of and architectural distortion on both the bladder fundus and dorsal uterus, but no evidence of fistula. No abscess or drainable fluid now. Trace pelvic free fluid is likely reactive. 2. No bowel obstruction or new abnormality in the abdomen or pelvis. Small to moderate gastric hiatal hernia. Chronic right renal cortical scarring and atrophy. Mild calcified aortic atherosclerosis. Electronically Signed   By: Genevie Ann M.D.   On: 08/15/2022 09:36    Assessment # Diverticular abscess in sigmoid mesentery in the setting of diverticulitis CT 10.11 with overall improved findings, still has Complicated sigmoid Diverticulitis with unresolved sigmoid inflammation (perhaps mildly improved since 07/23/2022). Secondary involvement of and architectural distortion on both the bladder fundus and dorsal uterus, but no evidence of fistula. No abscess or drainable fluid now.  ESR and CRP although elevated, downtrending  Will monitor off abtx as she continues to do well including downtrending inflammatory markers  Will not do labs today as she is doing well clinically  Fu with surgery for definitive management (robotic assisted sigmoidectomy/low anterior resection, flexible  sigmoidoscopy, cystoscopy/ureteral ICG by [facility] concurrently) as well as GI for pre op colonoscopy as planned Fu with me in a month Have discussed with patient to go to ED with fevers, chills, worsening abdominal pain, diarrhea etc to seek immediate attention  # C diff carrier  - will need to be judicious about abtx use   # PMR  - on simponi, MTX and prednisone previously, currently on prednisone 2 mg daily, simponi seems to have been recently started after visit with Rheumatology 10/31. Methotrexate remains on hold.   I have personally spent 42  minutes involved in face-to-face and non-face-to-face activities for this patient on the day of the visit including counseling of the patient and coordination of care.   Wilber Oliphant,  [facility] 09/07/2022, 9:50 AM

## 2022-09-07 NOTE — Addendum Note (Signed)
Addended by: Rosiland Oz on: 09/07/2022 02:06 PM   Modules accepted: Level of Service

## 2022-09-13 ENCOUNTER — Encounter: Payer: Self-pay | Admitting: Internal Medicine

## 2022-09-13 ENCOUNTER — Ambulatory Visit (INDEPENDENT_AMBULATORY_CARE_PROVIDER_SITE_OTHER): Payer: Medicare Other | Admitting: Internal Medicine

## 2022-09-13 VITALS — BP 120/80 | HR 61 | Ht 63.0 in | Wt 158.0 lb

## 2022-09-13 DIAGNOSIS — Z8719 Personal history of other diseases of the digestive system: Secondary | ICD-10-CM | POA: Diagnosis not present

## 2022-09-13 NOTE — Patient Instructions (Signed)
_______________________________________________________  If you are age 70 or older, your body mass index should be between 23-30. Your Body mass index is 27.99 kg/m. If this is out of the aforementioned range listed, please consider follow up with your Primary Care Provider. ________________________________________________________  The [facility] providers would like to encourage you to use [facility] to communicate with providers for non-urgent requests or questions.  Due to long hold times on the telephone, sending your provider a message by [facility] may be a faster and more efficient way to get a response.  Please allow 48 business hours for a response.  Please remember that this is for non-urgent requests.  _______________________________________________________  Jenny Glass have been scheduled for a colonoscopy. Please follow written instructions given to you at your visit today.  Please pick up your prep supplies at the pharmacy within the next 1-3 days. If you use inhalers (even only as needed), please bring them with you on the day of your procedure.  Due to recent changes in healthcare laws, you may see the results of your imaging and laboratory studies on MyChart before your provider has had a chance to review them.  We understand that in some cases there may be results that are confusing or concerning to you. Not all laboratory results come back in the same time frame and the provider may be waiting for multiple results in order to interpret others.  Please give [place] 48 hours in order for your provider to thoroughly review all the results before contacting the office for clarification of your results.   Thank you for entrusting me with your care and choosing [facility].  Dr Lorenso Courier

## 2022-09-13 NOTE — Progress Notes (Signed)
Chief Complaint: Diverticulitis  HPI : 71 year old female with history of polymyalgia rheumatica, chronic back pain, diverticulitis presents for diverticulitis  She had an admission for complicated diverticulitis 9/6-9/22. She was treated with a prolonged course of antibiotics. She has been off antibiotics for about 2.5 weeks without any issues. In the mornings she will still feel tender a little bit in her abdomen. The tenderness goes away on its own. Denies fevers. She is eating and drinking fairly well. Denies N&V. Weight has been stable. Last colonoscopy was in 2013 that showed some diverticulosis and hemorrhoids. She has issues with constipation for years. She has one BM once every 1 day. Denies hematochezia or melena. Denies use of blood thinners. Denies family history of GI issues.   Wt Readings from Last 3 Encounters:  09/13/22 158 lb (71.7 kg)  09/07/22 160 lb (72.6 kg)  08/31/22 157 lb (71.2 kg)   Past Medical History:  Diagnosis Date   Carotid artery stenosis    Chronic back pain    Diverticulitis    Hyperlipidemia    Hypertension    Numbness and tingling    hands bilat and lower legs bilat comes and goes    UTI (lower urinary tract infection)      Past Surgical History:  Procedure Laterality Date   SHOULDER OPEN ROTATOR CUFF REPAIR Left 11/10/2015   Procedure: LEFT SHOULDER MINI OPEN ROTATOR CUFF REPAIR AND DISTAL CLAVICAL RESECTION;  Surgeon: Susa Day, MD;  Location: [facility];  Service: Orthopedics;  Laterality: Left;   Family History  Problem Relation Age of Onset   Cancer Mother    Cancer Father    Social History   Tobacco Use   Smoking status: Never   Smokeless tobacco: Never  Vaping Use   Vaping Use: Never used  Substance Use Topics   Alcohol use: No   Drug use: No   Current Outpatient Medications  Medication Sig Dispense Refill   Calcium Carbonate Antacid 400 MG CHEW Chew 1 tablet by mouth daily.     folic acid (FOLVITE) 545 MCG tablet Take 400  mcg by mouth daily.     predniSONE (DELTASONE) 1 MG tablet Take 2 mg by mouth daily.     No current facility-administered medications for this visit.   No Known Allergies   Review of Systems: All systems reviewed and negative except where noted in HPI.   Physical Exam: BP 120/80   Pulse 61   Ht _0  (1.6 m)   Wt 158 lb (71.7 kg)   BMI 27.99 kg/m  Constitutional: Pleasant,well-developed, female in no acute distress. HEENT: Normocephalic and atraumatic. Conjunctivae are normal. No scleral icterus. Cardiovascular: Normal rate, regular rhythm.  Pulmonary/chest: Effort normal and breath sounds normal. No wheezing, rales or rhonchi. Abdominal: Soft, nondistended, mildly tender in the RLQ Bowel sounds active throughout. There are no masses palpable. No hepatomegaly. Extremities: No edema Neurological: Alert and oriented to person place and time. Skin: Skin is warm and dry. No rashes noted. Psychiatric: Normal mood and affect. Behavior is normal.  Labs 08/2022: CBC and CMP nml. ESR elevated at 34. CRP elevated at 48.2.  CT A/P w/contrast 07/11/22: IMPRESSION: 1. Acute sigmoid colon diverticulitis. Small peridiverticular abscess/fluid collection, 1.9 cm in greatest dimension. No other complicating features. 2. No other acute abnormality within the abdomen or pelvis.  CT A/P w/contrast 07/15/22: IMPRESSION: No significant change in moderate sigmoid diverticulitis. Slight increase in size of 2.6 cm diverticular abscess in central sigmoid mesentery. Increased  mild bilateral hydroureteronephrosis due to involvement of both ureters by diverticulitis. Stable moderate hiatal hernia.  CT A/P w/contrast 07/23/22: IMPRESSION: Imaging improvement. Less inflammatory change of the sigmoid colon itself. Less edema in the adjacent fat. Decreasing size of the small fluid collection in the sigmoid mesentery, 1 x 1.5 cm today compared with 1.5 x 2.5 cm previously.  CT A/P w/contrast  08/15/22: IMPRESSION: 1. Complicated sigmoid Diverticulitis with unresolved sigmoid inflammation (perhaps mildly improved since 07/23/2022). Secondary involvement of and architectural distortion on both the bladder fundus and dorsal uterus, but no evidence of fistula. No abscess or drainable fluid now. Trace pelvic free fluid is likely reactive. 2. No bowel obstruction or new abnormality in the abdomen or pelvis. Small to moderate gastric hiatal hernia. Chronic right renal cortical scarring and atrophy. Mild calcified aortic atherosclerosis.  Colonoscopy 04/07/12: Good prep. Small internal hemorrhoids. Extensive sigmoid diverticulosis.  ASSESSMENT AND PLAN: History of diverticulitis Patient presents to discuss a colonoscopy after an episode of complicated diverticulitis. Patient was not tender in the LLQ on exam, which suggests that the inflammation due to her episode of diverticulitis has improved. Will plan to proceed with colonoscopy to rule out underlying malignancy. She is currently scheduled for sigmoidectomy on 10/12/22 with Dr. Dema Severin, though patient did tell me today that she may want to hold off on the surgery since she is feeling better. - Colonoscopy [facility]. Miralax prep.  Christia Reading, MD  I spent 48 minutes of time, including in depth chart review, independent review of results as outlined above, communicating results with the patient directly, face-to-face time with the patient, coordinating care, ordering studies and medications as appropriate, and documentation.

## 2022-09-17 ENCOUNTER — Encounter: Payer: Self-pay | Admitting: Internal Medicine

## 2022-09-17 ENCOUNTER — Ambulatory Visit: Payer: Medicare Other | Admitting: Internal Medicine

## 2022-09-17 ENCOUNTER — Ambulatory Visit (AMBULATORY_SURGERY_CENTER): Payer: Medicare Other | Admitting: Internal Medicine

## 2022-09-17 VITALS — BP 144/89 | HR 84 | Temp 97.8°F | Resp 20 | Ht 63.0 in | Wt 158.0 lb

## 2022-09-17 VITALS — BP 137/82 | Resp 36

## 2022-09-17 DIAGNOSIS — D123 Benign neoplasm of transverse colon: Secondary | ICD-10-CM

## 2022-09-17 DIAGNOSIS — R109 Unspecified abdominal pain: Secondary | ICD-10-CM | POA: Diagnosis not present

## 2022-09-17 DIAGNOSIS — Z8719 Personal history of other diseases of the digestive system: Secondary | ICD-10-CM

## 2022-09-17 DIAGNOSIS — D122 Benign neoplasm of ascending colon: Secondary | ICD-10-CM | POA: Diagnosis not present

## 2022-09-17 DIAGNOSIS — D120 Benign neoplasm of cecum: Secondary | ICD-10-CM | POA: Diagnosis not present

## 2022-09-17 DIAGNOSIS — K5730 Diverticulosis of large intestine without perforation or abscess without bleeding: Secondary | ICD-10-CM | POA: Diagnosis not present

## 2022-09-17 DIAGNOSIS — K5792 Diverticulitis of intestine, part unspecified, without perforation or abscess without bleeding: Secondary | ICD-10-CM | POA: Diagnosis not present

## 2022-09-17 DIAGNOSIS — I10 Essential (primary) hypertension: Secondary | ICD-10-CM | POA: Diagnosis not present

## 2022-09-17 DIAGNOSIS — E785 Hyperlipidemia, unspecified: Secondary | ICD-10-CM | POA: Diagnosis not present

## 2022-09-17 MED ORDER — SODIUM CHLORIDE 0.9 % IV SOLN
4.0000 mg | Freq: Once | INTRAVENOUS | Status: AC
  Administered 2022-09-17: 4 mg via INTRAVENOUS

## 2022-09-17 MED ORDER — SODIUM CHLORIDE 0.9 % IV SOLN: 500.0000 mL | Freq: Once | INTRAVENOUS | Status: DC

## 2022-09-17 NOTE — Progress Notes (Signed)
Called to room to assist during endoscopic procedure.  Patient ID and intended procedure confirmed with present staff. Received instructions for my participation in the procedure from the performing physician.  

## 2022-09-17 NOTE — Progress Notes (Unsigned)
VS by CW  Pt's states no medical or surgical changes since previsit or office visit.  

## 2022-09-17 NOTE — Patient Instructions (Signed)
Please read handouts provided. Continue present medications. Await pathology results.   YOU HAD AN ENDOSCOPIC PROCEDURE TODAY AT THE [facility]:   Refer to the procedure report that was given to you for any specific questions about what was found during the examination.  If the procedure report does not answer your questions, please call your gastroenterologist to clarify.  If you requested that your care partner not be given the details of your procedure findings, then the procedure report has been included in a sealed envelope for you to review at your convenience later.  YOU SHOULD EXPECT: Some feelings of bloating in the abdomen. Passage of more gas than usual.  Walking can help get rid of the air that was put into your GI tract during the procedure and reduce the bloating. If you had a lower endoscopy (such as a colonoscopy or flexible sigmoidoscopy) you may notice spotting of blood in your stool or on the toilet paper. If you underwent a bowel prep for your procedure, you may not have a normal bowel movement for a few days.  Please Note:  You might notice some irritation and congestion in your nose or some drainage.  This is from the oxygen used during your procedure.  There is no need for concern and it should clear up in a day or so.  SYMPTOMS TO REPORT IMMEDIATELY:  Following lower endoscopy (colonoscopy or flexible sigmoidoscopy):  Excessive amounts of blood in the stool  Significant tenderness or worsening of abdominal pains  Swelling of the abdomen that is new, acute  Fever of 100F or higher  For urgent or emergent issues, a gastroenterologist can be reached at any hour by calling [phone]. Do not use MyChart messaging for urgent concerns.    DIET:  We do recommend a small meal at first, but then you may proceed to your regular diet.  Drink plenty of fluids but you should avoid alcoholic beverages for 24 hours.  ACTIVITY:  You should plan to take it easy for  the rest of today and you should NOT DRIVE or use heavy machinery until tomorrow (because of the sedation medicines used during the test).    FOLLOW UP: Our staff will call the number listed on your records the next business day following your procedure.  We will call around 7:15- 8:00 am to check on you and address any questions or concerns that you may have regarding the information given to you following your procedure. If we do not reach you, we will leave a message.     If any biopsies were taken you will be contacted by phone or by letter within the next 1-3 weeks.  Please call us at [phone] if you have not heard about the biopsies in 3 weeks.    SIGNATURES/CONFIDENTIALITY: You and/or your care partner have signed paperwork which will be entered into your electronic medical record.  These signatures attest to the fact that that the information above on your After Visit Summary has been reviewed and is understood.  Full responsibility of the confidentiality of this discharge information lies with you and/or your care-partner. 

## 2022-09-17 NOTE — Op Note (Addendum)
[facility] Patient Name: Jenny Glass Procedure Date: 09/17/2022 2:05 PM MRN: [id number] Endoscopist: Adline Mango [place] , , [id number] Age: 71 Referring MD:  Date of Birth: 05-21-1951 Gender: Female Account #: [id number] Procedure:                Colonoscopy Indications:              Follow-up of diverticulitis Medicines:                Monitored Anesthesia Care Procedure:                Pre-Anesthesia Assessment:                           - Prior to the procedure, a History and Physical                            was performed, and patient medications and                            allergies were reviewed. The patient's tolerance of                            previous anesthesia was also reviewed. The risks                            and benefits of the procedure and the sedation                            options and risks were discussed with the patient.                            All questions were answered, and informed consent                            was obtained. Prior Anticoagulants: The patient has                            taken no anticoagulant or antiplatelet agents. ASA                            Grade Assessment: II - A patient with mild systemic                            disease. After reviewing the risks and benefits,                            the patient was deemed in satisfactory condition to                            undergo the procedure.                           After obtaining informed consent, the colonoscope  was passed under direct vision. Throughout the                            procedure, the patient's blood pressure, pulse, and                            oxygen saturations were monitored continuously. The                            Olympus PCF-H190DL [id number]) Colonoscope was                            introduced through the anus and advanced to the the                            cecum, identified by  appendiceal orifice and                            ileocecal valve. The patient tolerated the                            procedure well. The colonoscopy was somewhat                            difficult due to narrowing from multiple                            diverticula in the colon and restricted mobility of                            the colon. The quality of the bowel preparation was                            good. The ileocecal valve, appendiceal orifice, and                            rectum were photographed. The 0441 PCF-H190TL [facility]                            SB Colonoscope was introduced through the anus and                            advanced to the the cecum, identified by                            appendiceal orifice and ileocecal valve. Scope In: 2:14:19 PM Scope Out: 2:45:24 PM Scope Withdrawal Time: 0 hours 21 minutes 13 seconds  Total Procedure Duration: 0 hours 31 minutes 5 seconds  Findings:                 A 2 mm polyp was found in the cecum. The polyp was                            sessile.  The polyp was removed with a cold biopsy                            forceps. Resection and retrieval were complete.                           Three sessile polyps were found in the transverse                            colon and ascending colon. The polyps were 3 to 4                            mm in size. These polyps were removed with a cold                            snare. Resection and retrieval were complete.                           Multiple large-mouthed diverticula with narrowing                            of the colon were found in the sigmoid colon and                            descending colon.                           Non-bleeding internal hemorrhoids were found during                            retroflexion. Complications:            No immediate complications. Estimated Blood Loss:     Estimated blood loss was minimal. Impression:               - One 2 mm polyp  in the cecum, removed with a cold                            biopsy forceps. Resected and retrieved.                           - Three 3 to 4 mm polyps in the transverse colon                            and in the ascending colon, removed with a cold                            snare. Resected and retrieved.                           - Diverticulosis with narrowing in the sigmoid                            colon and in the descending colon.                           -  Non-bleeding internal hemorrhoids.                           - ADDENDUM: Due to patient discomfort after the                            procedure, patient was brought back to the                            endoscopy suite and air was suctioned from the                            right side of the colon. Recommendation:           - Discharge patient to home (with escort).                           - Await pathology results.                           - Follow up with Dr. Dema Severin regarding surgical                            treatment of diverticulosis/prior diverticulitis.                           - The findings and recommendations were discussed                            with the patient. Dr Georgian Co "[name]" Lorenso Courier,  09/17/2022 2:53:35 PM

## 2022-09-17 NOTE — Progress Notes (Unsigned)
To pacu, VSS. Report to [email or web address].tb 

## 2022-09-17 NOTE — Progress Notes (Unsigned)
GASTROENTEROLOGY PROCEDURE H&P NOTE   Primary Care Physician: Merrilee Seashore, MD    Reason for Procedure:   History of diverticulitis  Plan:    Colonoscopy  Patient is appropriate for endoscopic procedure(s) in the ambulatory ([place] [facility]) setting.  The nature of the procedure, as well as the risks, benefits, and alternatives were carefully and thoroughly reviewed with the patient. Ample time for discussion and questions allowed. The patient understood, was satisfied, and agreed to proceed.     HPI: Jenny Glass is a 71 y.o. female who presents for colonoscopy for evaluation of history of diverticulitis .  Patient was most recently seen in the Gastroenterology Clinic on 09/13/22.  No interval change in medical history since that appointment. Please refer to that note for full details regarding GI history and clinical presentation.   Past Medical History:  Diagnosis Date   Carotid artery stenosis    Chronic back pain    Diverticulitis    Hyperlipidemia    Hypertension    Numbness and tingling    hands bilat and lower legs bilat comes and goes    UTI (lower urinary tract infection)     Past Surgical History:  Procedure Laterality Date   SHOULDER OPEN ROTATOR CUFF REPAIR Left 11/10/2015   Procedure: LEFT SHOULDER MINI OPEN ROTATOR CUFF REPAIR AND DISTAL CLAVICAL RESECTION;  Surgeon: Susa Day, MD;  Location: [facility];  Service: Orthopedics;  Laterality: Left;    Prior to Admission medications   Medication Sig Start Date End Date Taking? Authorizing Provider  Calcium Carbonate Antacid 400 MG CHEW Chew 1 tablet by mouth daily.    [provider]  folic acid (FOLVITE) 741 MCG tablet Take 400 mcg by mouth daily.    [provider]  predniSONE (DELTASONE) 1 MG tablet Take 2 mg by mouth daily. 04/10/22   [provider]    Current Outpatient Medications  Medication Sig Dispense Refill   Calcium Carbonate Antacid 400 MG CHEW Chew 1 tablet by mouth  daily.     folic acid (FOLVITE) 287 MCG tablet Take 400 mcg by mouth daily.     predniSONE (DELTASONE) 1 MG tablet Take 2 mg by mouth daily.     Current Facility-Administered Medications  Medication Dose Route Frequency Provider Last Rate Last Admin   0.9 %  sodium chloride infusion  500 mL Intravenous Once Sharyn Creamer, MD        Allergies as of 09/17/2022   (No Known Allergies)    Family History  Problem Relation Age of Onset   Cancer Mother    Cancer Father     Social History   Socioeconomic History   Marital status: Married    Spouse name: Not on file   Number of children: Not on file   Years of education: Not on file   Highest education level: Not on file  Occupational History   Not on file  Tobacco Use   Smoking status: Never   Smokeless tobacco: Never  Vaping Use   Vaping Use: Never used  Substance and Sexual Activity   Alcohol use: No   Drug use: No   Sexual activity: Never    Birth control/protection: None  Other Topics Concern   Not on file  Social History Narrative   Not on file   Social Determinants of Health   Financial Resource Strain: Not on file  Food Insecurity: No Food Insecurity (07/20/2022)   Hunger Vital Sign    Worried About Running  Out of Food in the Last Year: Never true    Ran Out of Food in the Last Year: Never true  Transportation Needs: No Transportation Needs (07/20/2022)   PRAPARE - Hydrologist (Medical): No    Lack of Transportation (Non-Medical): No  Physical Activity: Not on file  Stress: Not on file  Social Connections: Not on file  Intimate Partner Violence: Not At Risk (07/20/2022)   Humiliation, Afraid, Rape, and Kick questionnaire    Fear of Current or Ex-Partner: No    Emotionally Abused: No    Physically Abused: No    Sexually Abused: No    Physical Exam: Vital signs in last 24 hours: BP 126/77   Pulse 75   Temp 97.8 F (36.6 C) (Skin)   Ht '5\' 3"'$  (1.6 m)   Wt 158 lb (71.7 kg)    SpO2 97%   BMI 27.99 kg/m  GEN: NAD EYE: Sclerae anicteric ENT: MMM CV: Non-tachycardic Pulm: No increased WOB GI: Soft NEURO:  Alert & Oriented   Christia Reading, MD [facility]   09/17/2022 1:22 PM

## 2022-09-18 ENCOUNTER — Telehealth: Payer: Self-pay

## 2022-09-18 NOTE — Telephone Encounter (Signed)
  Follow up Call-     09/17/2022    1:15 PM  Call back number  Post procedure Call Back phone  # [phone]  Permission to leave phone message Yes     Patient questions:  Do you have a fever, pain , or abdominal swelling? Yes.   Pain Score   no pain score stated.  Patient complains of her stomach feeling like its going to explode.  Encouraged gas meds, ambulation, and drinking warm liquids.  Patient instructed to call the number provided on her d/c papers if no improvement  *  Have you tolerated food without any problems? No.  Have you been able to return to your normal activities? No.  Do you have any questions about your discharge instructions: Diet   No. Medications  No. Follow up visit  No.  Do you have questions or concerns about your Care? No.  Actions: * If pain score is 4 or above: Physician/ provider Notified : Y. Christia Reading, MD. Date 11/14 at Time [phone].

## 2022-09-19 NOTE — Telephone Encounter (Signed)
Called the patient and her husband today to check in. She is still having some abdominal discomfort, which appears to be slowly improving over time. Has passed a little bit of gas but not a substantial amount. She did try Gas-X, which seems to be helping. She has been able to eat and drink some. Not ambulating much. Denies fevers or N&V. I told him that it would be okay to try one dose of Miralax to see if this helps with passage of stool and flatus. Patient should call us back if she develops N&V, fevers, or worsened ab pain.   Beth, let's call to check in on her in 2 days to see how she is doing.

## 2022-09-20 DIAGNOSIS — M1991 Primary osteoarthritis, unspecified site: Secondary | ICD-10-CM | POA: Diagnosis not present

## 2022-09-20 DIAGNOSIS — E663 Overweight: Secondary | ICD-10-CM | POA: Diagnosis not present

## 2022-09-20 DIAGNOSIS — K5792 Diverticulitis of intestine, part unspecified, without perforation or abscess without bleeding: Secondary | ICD-10-CM | POA: Diagnosis not present

## 2022-09-20 DIAGNOSIS — M48061 Spinal stenosis, lumbar region without neurogenic claudication: Secondary | ICD-10-CM | POA: Diagnosis not present

## 2022-09-20 DIAGNOSIS — M0609 Rheumatoid arthritis without rheumatoid factor, multiple sites: Secondary | ICD-10-CM | POA: Diagnosis not present

## 2022-09-20 DIAGNOSIS — Z6827 Body mass index (BMI) 27.0-27.9, adult: Secondary | ICD-10-CM | POA: Diagnosis not present

## 2022-09-21 ENCOUNTER — Encounter: Payer: Self-pay | Admitting: Internal Medicine

## 2022-09-21 NOTE — Telephone Encounter (Signed)
Some improvement. Still very tired. Passing gas and stool. No nausea or vomiting. Able to eat. She will stay in touch with [place] and call if she acutely worsens.

## 2022-09-25 NOTE — Progress Notes (Signed)
Surgery orders requested via Epic inbox. °

## 2022-10-01 NOTE — Progress Notes (Addendum)
COVID Vaccine received:  []  No [x]  Yes Date of any COVID positive Test in last 90 days:  None  PCP - Georgianne Fick, MD Cardiologist - Jaclyn Prime, DO  Infectious Disease-  Odette Fraction, MD Rheumatology- [facility]     Chest x-ray - 1v on 07-13-2022  epic EKG -  05-01-2022 epic Stress Test - 05-19-2022 ECHO - 05-26-2022 epic Cardiac Cath -   PCR screen: []  Ordered & Completed                      [x]   No Order but Needs PROFEND                      []   N/A for this surgery  Surgery Plan:  []  Ambulatory                            []  Outpatient in bed                            [x]  Admit  Anesthesia:    [x]  General  []  Spinal                           []   Choice []   MAC  Bowel Prep - []  No  [x]   Yes __Dulcolax, Neomycin, Miralax etc prep__  Pacemaker / ICD device [x]  No []  Yes        Device order form faxed [x]  No    []   Yes      Faxed to:  History of Sleep Apnea? [x]  No []  Yes   CPAP used?- [x]  No []  Yes    Does the patient monitor blood sugar? []  No []  Yes  [x]  N/A  Blood Thinner / Instructions:none Aspirin Instructions:  ERAS Protocol Ordered: []  No  [x]  Yes PRE-SURGERY [x]  ENSURE X 3  Patient is to be NPO after: 0900  Comments: requested orders x 2 by IB msg to Dr. Cliffton Asters.  Orders came but they say NPO and ERAS, will send note to clarify. Also, there was no order for an [employer]; Since the patient is having an LAR, I sent Dr. Cliffton Asters an IB message on this to clarify. I did not get a reply or see an order for this consult as of PST time.  The patient said that she was told by the doctor that she would have a "bag" after her surgery.According to Mrs Junco, she was not told any other information about her surgery or bowel prep.  I called Melody Austin, ostomy nurse, and she said she would come and speak with the patient and do the ostomy markings while Mrs Hullum was here for her PST visit.   Activity level: Patient can climb a flight of stairs without  difficulty; [x]  No CP  [x]  No SOB  Patient can perform ADLs without assistance.   Anesthesia review: HTN, RA   Patient denies shortness of breath, fever, cough and chest pain at PAT appointment.  Patient verbalized understanding and agreement to the Pre-Surgical Instructions that were given to them at this PAT appointment. Patient was also educated of the need to review these PAT instructions again prior to his/her surgery.I reviewed the appropriate phone numbers to call if they have any and questions or concerns.

## 2022-10-02 ENCOUNTER — Ambulatory Visit: Payer: Self-pay | Admitting: Surgery

## 2022-10-02 DIAGNOSIS — Z01812 Encounter for preprocedural laboratory examination: Secondary | ICD-10-CM

## 2022-10-02 DIAGNOSIS — Z23 Encounter for immunization: Secondary | ICD-10-CM | POA: Diagnosis not present

## 2022-10-02 DIAGNOSIS — Z01818 Encounter for other preprocedural examination: Secondary | ICD-10-CM

## 2022-10-02 NOTE — Patient Instructions (Addendum)
SURGICAL WAITING ROOM VISITATION Patients having surgery or a procedure may have no more than 2 support people in the waiting area - these visitors may rotate in the visitor waiting room.   Children under the age of 73 must have an adult with them who is not the patient. If the patient needs to stay at the hospital during part of their recovery, the visitor guidelines for inpatient rooms apply.  PRE-OP VISITATION  Pre-op nurse will coordinate an appropriate time for 1 support person to accompany the patient in pre-op.  This support person may not rotate.  This visitor will be contacted when the time is appropriate for the visitor to come back in the pre-op area.  Please refer to the [facility] website for the visitor guidelines for Inpatients (after your surgery is over and you are in a regular room).  You are not required to quarantine at this time prior to your surgery. However, you must do this: Hand Hygiene often Do NOT share personal items Notify your provider if you are in close contact with someone who has COVID or you develop fever 100.4 or greater, new onset of sneezing, cough, sore throat, shortness of breath or body aches.  If you test positive for Covid or have been in contact with anyone that has tested positive in the last 10 days please notify you surgeon.    Your procedure is scheduled on:  Friday  October 12, 2022  Report to [facility] Main Entrance: [place] entrance where the [employer] is available.   Report to admitting at: 09:45    AM  +++++Call this number if you have any questions or problems the morning of surgery [phone]  FOLLOW BOWEL PREP AND ANY ADDITIONAL PRE OP INSTRUCTIONS YOU RECEIVED FROM YOUR SURGEON'S OFFICE!!! Dulcolax 20   mg - Take one (1) tablets with water at 07:00 am the day prior to surgery.  Miralax 255 g - Mix with 64 oz Gatorade/Powerade.  Starting at 10:00 am ,Drink this gradually over the next few hours (8 oz glass every  15-30 minutes) until gone the day prior to surgery You should finish in 4 hours-6 hours.    Neomycin 1000 mg - At 2 pm, 3 pm and 10 pm after Miralax  bowel prep the day prior to surgery.  Metronidazole 1000 mg - At 2 pm, 3 pm and 10 pm after Miralax bowel prep the day prior to surgery.   Drink plenty of clear liquids all evening to avoid getting dehydrated.   DRINK two (2) bottles of Pre-Surgery Clear Ensure starting at 6:00 pm the evening prior to your surgery to help prevent dehydration. Increase drinking clear fluids (see list Below)          Do not eat food after Midnight the night prior to your surgery/procedure.  After Midnight you may have the following liquids until 09:00   AM DAY OF SURGERY  Clear Liquid Diet Water Black Coffee (sugar ok, NO MILK/CREAM OR CREAMERS)  Tea (sugar ok, NO MILK/CREAM OR CREAMERS) regular and decaf                             Plain Jell-O  with no fruit (NO RED)  Fruit ices (not with fruit pulp, NO RED)                                     Popsicles (NO RED)                                                                  Juice: apple, WHITE grape, WHITE cranberry Sports drinks like Gatorade or Powerade (NO RED)                   The day of surgery:  Drink ONE (1) Pre-Surgery Clear Ensure at  09:00   AM the morning of surgery. Drink in one sitting. Do not sip.  This drink was given to you during your hospital pre-op appointment visit. Nothing else to drink after completing the Pre-Surgery Clear Ensure: No candy, chewing gum or throat lozenges.    Oral Hygiene is also important to reduce your risk of infection.        Remember - BRUSH YOUR TEETH THE MORNING OF SURGERY WITH YOUR REGULAR TOOTHPASTE  Take ONLY these medicines the morning of surgery with A SIP OF WATER: none   You may not have any metal on your body including hair pins, jewelry, and body piercing  Do not wear make-up, lotions, powders,  perfumes or deodorant  Do not wear nail polish including gel and S&S, artificial / acrylic nails, or any other type of covering on natural nails including finger and toenails. If you have artificial nails, gel coating, etc., that needs to be removed by a nail salon, Please have this removed prior to surgery. Not doing so may mean that your surgery could be cancelled or delayed if the Surgeon or anesthesia staff feels like they are unable to monitor you safely.   Do not shave 48 hours prior to surgery to avoid nicks in your skin which may contribute to postoperative infections.   Contacts, Hearing Aids, dentures or bridgework may not be worn into surgery.   You may bring a small overnight bag with you on the day of surgery, only pack items that are not valuable .[place] IS NOT RESPONSIBLE   FOR VALUABLES THAT ARE LOST OR STOLEN.   Do not bring your home medications to the hospital. The Pharmacy will dispense medications listed on your medication list to you during your admission in the Hospital.  Special Instructions: Bring a copy of your healthcare power of attorney and living will documents the day of surgery, if you wish to have them scanned into your [facility] Records- EPIC  Please read over the following fact sheets you were given: IF YOU HAVE QUESTIONS ABOUT YOUR PRE-OP INSTRUCTIONS, PLEASE CALL [phone]  ([place])   [place] - Preparing for Surgery Before surgery, you can play an important role.  Because skin is not sterile, your skin needs to be as free of germs as possible.  You can reduce the number of germs on your skin by washing with CHG (chlorahexidine gluconate) soap before surgery.  CHG is an antiseptic cleaner which kills germs and bonds with the skin to continue killing germs even after washing. Please DO NOT use if you have an  allergy to CHG or antibacterial soaps.  If your skin becomes reddened/irritated stop using the CHG and inform your nurse when you arrive at  Short Stay. Do not shave (including legs and underarms) for at least 48 hours prior to the first CHG shower.  You may shave your face/neck.  Please follow these instructions carefully:  1.  Shower with CHG Soap the night before surgery and the  morning of surgery.  2.  If you choose to wash your hair, wash your hair first as usual with your normal  shampoo.  3.  After you shampoo, rinse your hair and body thoroughly to remove the shampoo.                             4.  Use CHG as you would any other liquid soap.  You can apply chg directly to the skin and wash.  Gently with a scrungie or clean washcloth.  5.  Apply the CHG Soap to your body ONLY FROM THE NECK DOWN.   Do not use on face/ open                           Wound or open sores. Avoid contact with eyes, ears mouth and genitals (private parts).                       Wash face,  Genitals (private parts) with your normal soap.             6.  Wash thoroughly, paying special attention to the area where your  surgery  will be performed.  7.  Thoroughly rinse your body with warm water from the neck down.  8.  DO NOT shower/wash with your normal soap after using and rinsing off the CHG Soap.            9.  Pat yourself dry with a clean towel.            10.  Wear clean pajamas.            11.  Place clean sheets on your bed the night of your first shower and do not  sleep with pets.  ON THE DAY OF SURGERY : Do not apply any lotions/deodorants the morning of surgery.  Please wear clean clothes to the hospital/surgery center.    FAILURE TO FOLLOW THESE INSTRUCTIONS MAY RESULT IN THE CANCELLATION OF YOUR SURGERY  PATIENT SIGNATURE_________________________________  NURSE SIGNATURE__________________________________  ________________________________________________________________________        WHAT IS A BLOOD TRANSFUSION? Blood Transfusion Information  A transfusion is the replacement of blood or some of its parts. Blood is made  up of multiple cells which provide different functions. Red blood cells carry oxygen and are used for blood loss replacement. White blood cells fight against infection. Platelets control bleeding. Plasma helps clot blood. Other blood products are available for specialized needs, such as hemophilia or other clotting disorders. BEFORE THE TRANSFUSION  Who gives blood for transfusions?  Healthy volunteers who are fully evaluated to make sure their blood is safe. This is blood bank blood. Transfusion therapy is the safest it has ever been in the practice of medicine. Before blood is taken from a donor, a complete history is taken to make sure that person has no history of diseases nor engages in risky social behavior (examples are intravenous drug use or sexual  activity with multiple partners). The donor's travel history is screened to minimize risk of transmitting infections, such as malaria. The donated blood is tested for signs of infectious diseases, such as HIV and hepatitis. The blood is then tested to be sure it is compatible with you in order to minimize the chance of a transfusion reaction. If you or a relative donates blood, this is often done in anticipation of surgery and is not appropriate for emergency situations. It takes many days to process the donated blood. RISKS AND COMPLICATIONS Although transfusion therapy is very safe and saves many lives, the main dangers of transfusion include:  Getting an infectious disease. Developing a transfusion reaction. This is an allergic reaction to something in the blood you were given. Every precaution is taken to prevent this. The decision to have a blood transfusion has been considered carefully by your caregiver before blood is given. Blood is not given unless the benefits outweigh the risks. AFTER THE TRANSFUSION Right after receiving a blood transfusion, you will usually feel much better and more energetic. This is especially true if your red  blood cells have gotten low (anemic). The transfusion raises the level of the red blood cells which carry oxygen, and this usually causes an energy increase. The nurse administering the transfusion will monitor you carefully for complications. HOME CARE INSTRUCTIONS  No special instructions are needed after a transfusion. You may find your energy is better. Speak with your caregiver about any limitations on activity for underlying diseases you may have. SEEK MEDICAL CARE IF:  Your condition is not improving after your transfusion. You develop redness or irritation at the intravenous (IV) site. SEEK IMMEDIATE MEDICAL CARE IF:  Any of the following symptoms occur over the next 12 hours: Shaking chills. You have a temperature by mouth above 102 F (38.9 C), not controlled by medicine. Chest, back, or muscle pain. People around you feel you are not acting correctly or are confused. Shortness of breath or difficulty breathing. Dizziness and fainting. You get a rash or develop hives. You have a decrease in urine output. Your urine turns a dark color or changes to pink, red, or brown. Any of the following symptoms occur over the next 10 days: You have a temperature by mouth above 102 F (38.9 C), not controlled by medicine. Shortness of breath. Weakness after normal activity. The white part of the eye turns yellow (jaundice). You have a decrease in the amount of urine or are urinating less often. Your urine turns a dark color or changes to pink, red, or brown. Document Released: 10/19/2000 Document Revised: 01/14/2012 Document Reviewed: 06/07/2008 [facility] Patient Information 2014 [place], [place].  _______________________________________________________________________

## 2022-10-03 ENCOUNTER — Other Ambulatory Visit: Payer: Self-pay

## 2022-10-03 ENCOUNTER — Encounter (HOSPITAL_COMMUNITY)
Admission: RE | Admit: 2022-10-03 | Discharge: 2022-10-03 | Disposition: A | Discharge status: Home / Self Care | Payer: Medicare Other | Source: Ambulatory Visit | Attending: Surgery | Admitting: Surgery

## 2022-10-03 ENCOUNTER — Encounter (HOSPITAL_COMMUNITY): Payer: Self-pay

## 2022-10-03 VITALS — BP 135/90 | HR 73 | Temp 98.5°F | Resp 16 | Ht 63.0 in | Wt 152.0 lb

## 2022-10-03 DIAGNOSIS — Z01812 Encounter for preprocedural laboratory examination: Secondary | ICD-10-CM | POA: Insufficient documentation

## 2022-10-03 DIAGNOSIS — I10 Essential (primary) hypertension: Secondary | ICD-10-CM | POA: Diagnosis not present

## 2022-10-03 DIAGNOSIS — Z01818 Encounter for other preprocedural examination: Secondary | ICD-10-CM

## 2022-10-03 HISTORY — DX: Rheumatoid arthritis, unspecified: M06.9

## 2022-10-03 HISTORY — DX: Unspecified osteoarthritis, unspecified site: M19.90

## 2022-10-03 LAB — BASIC METABOLIC PANEL
Anion gap: 7 (ref 5–15)
BUN: 16 mg/dL (ref 8–23)
CO2: 27 mmol/L (ref 22–32)
Calcium: 9.6 mg/dL (ref 8.9–10.3)
Chloride: 107 mmol/L (ref 98–111)
Creatinine, Ser: 1.01 mg/dL — ABNORMAL HIGH (ref 0.44–1.00)
GFR, Estimated: 60 mL/min — ABNORMAL LOW (ref >60)
Glucose, Bld: 99 mg/dL (ref 70–99)
Potassium: 4.3 mmol/L (ref 3.5–5.1)
Sodium: 141 mmol/L (ref 135–145)

## 2022-10-03 LAB — CBC
HCT: 39 % (ref 36.0–46.0)
Hemoglobin: 11.9 g/dL — ABNORMAL LOW (ref 12.0–15.0)
MCH: 27.7 pg (ref 26.0–34.0)
MCHC: 30.5 g/dL (ref 30.0–36.0)
MCV: 90.9 fL (ref 80.0–100.0)
Platelets: 353 10*3/uL (ref 150–400)
RBC: 4.29 MIL/uL (ref 3.87–5.11)
RDW: 14.7 % (ref 11.5–15.5)
WBC: 10.3 10*3/uL (ref 4.0–10.5)
nRBC: 0 % (ref 0.0–0.2)

## 2022-10-03 NOTE — Consult Note (Signed)
[place] Nurse requested for preoperative stoma site marking  Discussed surgical procedure and stoma creation with patient and husband. Extensive visit with discussion of ostomy creation, need for marking. Explained role of the [place] nurse team.  Provided the patient with educational booklet and provided samples of pouching options.  Answered patient and family questions reguarding complications, clothing with ostomy, shower/bathing with ostomy, swimming, ADLS, other outdoors activities.  Patient is very anxious about surgery.  Examined patient sitting and standing in order to place the marking in the patient's visual field, away from any creases or abdominal contour issues and within the rectus muscle.  Attempted to mark below the patient's belt line.   Marked for colostomy in the LLQ  __4__ cm to the left of the umbilicus and __8__TL below the umbilicus.  Marked for ileostomy in the RLQ  _3___cm to the right of the umbilicus and  __5__ cm below the umbilicus.     Patient's abdomen cleansed with CHG wipes at site markings, allowed to air dry prior to marking.Covered mark with thin film transparent dressing to preserve mark until date of surgery.   [place] Nurse team will follow up with patient after surgery for continue ostomy care and teaching.  [place] MSN, [place], [place], [place]

## 2022-10-08 ENCOUNTER — Ambulatory Visit: Payer: Medicare Other | Admitting: Infectious Diseases

## 2022-10-11 ENCOUNTER — Ambulatory Visit: Payer: Medicare Other | Admitting: Internal Medicine

## 2022-10-12 ENCOUNTER — Encounter (HOSPITAL_COMMUNITY)
Admission: RE | Disposition: A | Discharge status: Home Health | Payer: Self-pay | Source: Ambulatory Visit | Attending: Surgery

## 2022-10-12 ENCOUNTER — Inpatient Hospital Stay (HOSPITAL_COMMUNITY): Payer: Medicare Other | Admitting: Anesthesiology

## 2022-10-12 ENCOUNTER — Inpatient Hospital Stay (HOSPITAL_COMMUNITY)
Admission: RE | Admit: 2022-10-12 | Discharge: 2022-10-17 | DRG: 329 | Disposition: A | Discharge status: Home Health | Payer: Medicare Other | Source: Ambulatory Visit | Attending: Surgery | Admitting: Surgery

## 2022-10-12 ENCOUNTER — Other Ambulatory Visit: Payer: Self-pay

## 2022-10-12 ENCOUNTER — Encounter (HOSPITAL_COMMUNITY): Payer: Self-pay | Admitting: Surgery

## 2022-10-12 DIAGNOSIS — M1990 Unspecified osteoarthritis, unspecified site: Secondary | ICD-10-CM

## 2022-10-12 DIAGNOSIS — E785 Hyperlipidemia, unspecified: Secondary | ICD-10-CM | POA: Diagnosis present

## 2022-10-12 DIAGNOSIS — D127 Benign neoplasm of rectosigmoid junction: Secondary | ICD-10-CM | POA: Diagnosis not present

## 2022-10-12 DIAGNOSIS — R6881 Early satiety: Secondary | ICD-10-CM | POA: Diagnosis present

## 2022-10-12 DIAGNOSIS — I700 Atherosclerosis of aorta: Secondary | ICD-10-CM | POA: Diagnosis present

## 2022-10-12 DIAGNOSIS — M353 Polymyalgia rheumatica: Secondary | ICD-10-CM | POA: Diagnosis present

## 2022-10-12 DIAGNOSIS — I10 Essential (primary) hypertension: Secondary | ICD-10-CM | POA: Diagnosis present

## 2022-10-12 DIAGNOSIS — Z809 Family history of malignant neoplasm, unspecified: Secondary | ICD-10-CM | POA: Diagnosis not present

## 2022-10-12 DIAGNOSIS — N838 Other noninflammatory disorders of ovary, fallopian tube and broad ligament: Secondary | ICD-10-CM | POA: Diagnosis not present

## 2022-10-12 DIAGNOSIS — D62 Acute posthemorrhagic anemia: Secondary | ICD-10-CM | POA: Diagnosis present

## 2022-10-12 DIAGNOSIS — K5720 Diverticulitis of large intestine with perforation and abscess without bleeding: Principal | ICD-10-CM | POA: Diagnosis present

## 2022-10-12 DIAGNOSIS — D509 Iron deficiency anemia, unspecified: Secondary | ICD-10-CM | POA: Diagnosis present

## 2022-10-12 DIAGNOSIS — K651 Peritoneal abscess: Secondary | ICD-10-CM | POA: Diagnosis present

## 2022-10-12 DIAGNOSIS — K449 Diaphragmatic hernia without obstruction or gangrene: Secondary | ICD-10-CM | POA: Diagnosis present

## 2022-10-12 DIAGNOSIS — Z01818 Encounter for other preprocedural examination: Secondary | ICD-10-CM

## 2022-10-12 DIAGNOSIS — N1330 Unspecified hydronephrosis: Secondary | ICD-10-CM | POA: Diagnosis present

## 2022-10-12 DIAGNOSIS — M069 Rheumatoid arthritis, unspecified: Secondary | ICD-10-CM | POA: Diagnosis present

## 2022-10-12 DIAGNOSIS — Z933 Colostomy status: Principal | ICD-10-CM

## 2022-10-12 DIAGNOSIS — K5732 Diverticulitis of large intestine without perforation or abscess without bleeding: Secondary | ICD-10-CM

## 2022-10-12 DIAGNOSIS — K6389 Other specified diseases of intestine: Secondary | ICD-10-CM | POA: Diagnosis not present

## 2022-10-12 DIAGNOSIS — Z9049 Acquired absence of other specified parts of digestive tract: Secondary | ICD-10-CM

## 2022-10-12 DIAGNOSIS — K5790 Diverticulosis of intestine, part unspecified, without perforation or abscess without bleeding: Secondary | ICD-10-CM | POA: Diagnosis not present

## 2022-10-12 HISTORY — PX: XI ROBOTIC ASSISTED LOWER ANTERIOR RESECTION: SHX6558

## 2022-10-12 LAB — CBC WITH DIFFERENTIAL/PLATELET
Abs Immature Granulocytes: 0.04 10*3/uL (ref 0.00–0.07)
Basophils Absolute: 0 10*3/uL (ref 0.0–0.1)
Basophils Relative: 0 %
Eosinophils Absolute: 0 10*3/uL (ref 0.0–0.5)
Eosinophils Relative: 0 %
HCT: 33.7 % — ABNORMAL LOW (ref 36.0–46.0)
Hemoglobin: 10.3 g/dL — ABNORMAL LOW (ref 12.0–15.0)
Immature Granulocytes: 0 %
Lymphocytes Relative: 5 %
Lymphs Abs: 0.6 10*3/uL — ABNORMAL LOW (ref 0.7–4.0)
MCH: 28.4 pg (ref 26.0–34.0)
MCHC: 30.6 g/dL (ref 30.0–36.0)
MCV: 92.8 fL (ref 80.0–100.0)
Monocytes Absolute: 0.3 10*3/uL (ref 0.1–1.0)
Monocytes Relative: 2 %
Neutro Abs: 10.6 10*3/uL — ABNORMAL HIGH (ref 1.7–7.7)
Neutrophils Relative %: 93 %
Platelets: 240 10*3/uL (ref 150–400)
RBC: 3.63 MIL/uL — ABNORMAL LOW (ref 3.87–5.11)
RDW: 14.6 % (ref 11.5–15.5)
WBC: 11.6 10*3/uL — ABNORMAL HIGH (ref 4.0–10.5)
nRBC: 0 % (ref 0.0–0.2)

## 2022-10-12 LAB — COMPREHENSIVE METABOLIC PANEL
ALT: 10 U/L (ref 0–44)
AST: 13 U/L — ABNORMAL LOW (ref 15–41)
Albumin: 3.9 g/dL (ref 3.5–5.0)
Alkaline Phosphatase: 46 U/L (ref 38–126)
Anion gap: 7 (ref 5–15)
BUN: 11 mg/dL (ref 8–23)
CO2: 25 mmol/L (ref 22–32)
Calcium: 8 mg/dL — ABNORMAL LOW (ref 8.9–10.3)
Chloride: 105 mmol/L (ref 98–111)
Creatinine, Ser: 1.16 mg/dL — ABNORMAL HIGH (ref 0.44–1.00)
GFR, Estimated: 51 mL/min — ABNORMAL LOW (ref >60)
Glucose, Bld: 178 mg/dL — ABNORMAL HIGH (ref 70–99)
Potassium: 3.4 mmol/L — ABNORMAL LOW (ref 3.5–5.1)
Sodium: 137 mmol/L (ref 135–145)
Total Bilirubin: 0.4 mg/dL (ref 0.3–1.2)
Total Protein: 6.3 g/dL — ABNORMAL LOW (ref 6.5–8.1)

## 2022-10-12 LAB — TYPE AND SCREEN
ABO/RH(D): O POS
Antibody Screen: NEGATIVE

## 2022-10-12 LAB — ABO/RH: ABO/RH(D): O POS

## 2022-10-12 SURGERY — RESECTION, RECTUM, LOW ANTERIOR, ROBOT-ASSISTED
Anesthesia: General

## 2022-10-12 MED ORDER — FENTANYL CITRATE PF 50 MCG/ML IJ SOSY: 25.0000 ug | PREFILLED_SYRINGE | INTRAMUSCULAR | Status: DC | PRN

## 2022-10-12 MED ORDER — LABETALOL HCL 5 MG/ML IV SOLN
INTRAVENOUS | Status: DC | PRN
  Administered 2022-10-12 (×2): 2.5 mg via INTRAVENOUS

## 2022-10-12 MED ORDER — DIPHENHYDRAMINE HCL 50 MG/ML IJ SOLN: 12.5000 mg | Freq: Four times a day (QID) | INTRAMUSCULAR | Status: DC | PRN

## 2022-10-12 MED ORDER — OXYCODONE HCL 5 MG/5ML PO SOLN: 5.0000 mg | Freq: Once | ORAL | Status: DC | PRN

## 2022-10-12 MED ORDER — ALVIMOPAN 12 MG PO CAPS
12.0000 mg | ORAL_CAPSULE | ORAL | Status: AC
  Administered 2022-10-12: 12 mg via ORAL
  Filled 2022-10-12: qty 1

## 2022-10-12 MED ORDER — BUPIVACAINE-EPINEPHRINE (PF) 0.5% -1:200000 IJ SOLN
INTRAMUSCULAR | Status: AC
  Filled 2022-10-12: qty 30

## 2022-10-12 MED ORDER — BISACODYL 5 MG PO TBEC: 20.0000 mg | DELAYED_RELEASE_TABLET | Freq: Once | ORAL | Status: DC

## 2022-10-12 MED ORDER — SIMETHICONE 80 MG PO CHEW: 40.0000 mg | CHEWABLE_TABLET | Freq: Four times a day (QID) | ORAL | Status: DC | PRN

## 2022-10-12 MED ORDER — LACTATED RINGERS IR SOLN
Status: DC | PRN
  Administered 2022-10-12: 1000 mL

## 2022-10-12 MED ORDER — PROPOFOL 10 MG/ML IV BOLUS
INTRAVENOUS | Status: DC | PRN
  Administered 2022-10-12: 100 mg via INTRAVENOUS

## 2022-10-12 MED ORDER — ORAL CARE MOUTH RINSE: 15.0000 mL | Freq: Once | OROMUCOSAL | Status: AC

## 2022-10-12 MED ORDER — HEPARIN SODIUM (PORCINE) 5000 UNIT/ML IJ SOLN
5000.0000 [IU] | Freq: Three times a day (TID) | INTRAMUSCULAR | Status: DC
  Administered 2022-10-12 – 2022-10-17 (×14): 5000 [IU] via SUBCUTANEOUS
  Filled 2022-10-12 (×14): qty 1

## 2022-10-12 MED ORDER — MIDAZOLAM HCL 2 MG/2ML IJ SOLN
INTRAMUSCULAR | Status: AC
  Filled 2022-10-12: qty 2

## 2022-10-12 MED ORDER — ALBUMIN HUMAN 5 % IV SOLN
INTRAVENOUS | Status: AC
  Filled 2022-10-12: qty 250

## 2022-10-12 MED ORDER — BUPIVACAINE LIPOSOME 1.3 % IJ SUSP: 20.0000 mL | Freq: Once | INTRAMUSCULAR | Status: DC

## 2022-10-12 MED ORDER — HYDROMORPHONE HCL 1 MG/ML IJ SOLN
0.5000 mg | INTRAMUSCULAR | Status: DC | PRN
  Administered 2022-10-12 – 2022-10-13 (×2): 0.5 mg via INTRAVENOUS
  Filled 2022-10-12 (×2): qty 0.5

## 2022-10-12 MED ORDER — ALUM & MAG HYDROXIDE-SIMETH 200-200-20 MG/5ML PO SUSP
30.0000 mL | Freq: Four times a day (QID) | ORAL | Status: DC | PRN
  Administered 2022-10-13: 30 mL via ORAL
  Filled 2022-10-12: qty 30

## 2022-10-12 MED ORDER — 0.9 % SODIUM CHLORIDE (POUR BTL) OPTIME
TOPICAL | Status: DC | PRN
  Administered 2022-10-12: 1000 mL

## 2022-10-12 MED ORDER — LACTATED RINGERS IV SOLN: INTRAVENOUS | Status: DC | PRN

## 2022-10-12 MED ORDER — IBUPROFEN 400 MG PO TABS
600.0000 mg | ORAL_TABLET | Freq: Four times a day (QID) | ORAL | Status: DC | PRN
  Administered 2022-10-12 – 2022-10-17 (×5): 600 mg via ORAL
  Filled 2022-10-12 (×5): qty 1

## 2022-10-12 MED ORDER — LIDOCAINE HCL 2 % IJ SOLN
INTRAMUSCULAR | Status: AC
  Filled 2022-10-12: qty 20

## 2022-10-12 MED ORDER — FENTANYL CITRATE (PF) 250 MCG/5ML IJ SOLN
INTRAMUSCULAR | Status: AC
  Filled 2022-10-12: qty 5

## 2022-10-12 MED ORDER — TRAMADOL HCL 50 MG PO TABS: 50.0000 mg | ORAL_TABLET | Freq: Four times a day (QID) | ORAL | 0 refills | Status: AC | PRN

## 2022-10-12 MED ORDER — LACTATED RINGERS IV SOLN: INTRAVENOUS | Status: DC

## 2022-10-12 MED ORDER — LIDOCAINE HCL (CARDIAC) PF 100 MG/5ML IV SOSY
PREFILLED_SYRINGE | INTRAVENOUS | Status: DC | PRN
  Administered 2022-10-12: 60 mg via INTRAVENOUS

## 2022-10-12 MED ORDER — HYDRALAZINE HCL 20 MG/ML IJ SOLN: 10.0000 mg | INTRAMUSCULAR | Status: DC | PRN

## 2022-10-12 MED ORDER — DEXAMETHASONE SODIUM PHOSPHATE 10 MG/ML IJ SOLN
INTRAMUSCULAR | Status: DC | PRN
  Administered 2022-10-12: 10 mg via INTRAVENOUS

## 2022-10-12 MED ORDER — VITAMIN D 25 MCG (1000 UNIT) PO TABS
2000.0000 [IU] | ORAL_TABLET | Freq: Every day | ORAL | Status: DC
  Administered 2022-10-13 – 2022-10-17 (×5): 2000 [IU] via ORAL
  Filled 2022-10-12 (×5): qty 2

## 2022-10-12 MED ORDER — SODIUM CHLORIDE 0.9 % IR SOLN
Status: DC | PRN
  Administered 2022-10-12: 1000 mL

## 2022-10-12 MED ORDER — OYSTER SHELL CALCIUM/D3 500-5 MG-MCG PO TABS
1.0000 | ORAL_TABLET | Freq: Every day | ORAL | Status: DC
  Administered 2022-10-13 – 2022-10-17 (×5): 1 via ORAL
  Filled 2022-10-12 (×5): qty 1

## 2022-10-12 MED ORDER — ONDANSETRON HCL 4 MG PO TABS: 4.0000 mg | ORAL_TABLET | Freq: Four times a day (QID) | ORAL | Status: DC | PRN

## 2022-10-12 MED ORDER — LABETALOL HCL 5 MG/ML IV SOLN
INTRAVENOUS | Status: AC
  Filled 2022-10-12: qty 4

## 2022-10-12 MED ORDER — METRONIDAZOLE 500 MG PO TABS: 1000.0000 mg | ORAL_TABLET | ORAL | Status: DC

## 2022-10-12 MED ORDER — HEPARIN SODIUM (PORCINE) 5000 UNIT/ML IJ SOLN
5000.0000 [IU] | Freq: Once | INTRAMUSCULAR | Status: AC
  Administered 2022-10-12: 5000 [IU] via SUBCUTANEOUS
  Filled 2022-10-12: qty 1

## 2022-10-12 MED ORDER — BUPIVACAINE LIPOSOME 1.3 % IJ SUSP
INTRAMUSCULAR | Status: DC | PRN
  Administered 2022-10-12: 20 mL

## 2022-10-12 MED ORDER — PREDNISONE 1 MG PO TABS
2.0000 mg | ORAL_TABLET | Freq: Every day | ORAL | Status: DC
  Administered 2022-10-13 – 2022-10-17 (×5): 2 mg via ORAL
  Filled 2022-10-12 (×5): qty 2

## 2022-10-12 MED ORDER — ENSURE SURGERY PO LIQD
237.0000 mL | Freq: Two times a day (BID) | ORAL | Status: DC
  Administered 2022-10-13 – 2022-10-17 (×8): 237 mL via ORAL

## 2022-10-12 MED ORDER — LIDOCAINE HCL (PF) 2 % IJ SOLN
INTRAMUSCULAR | Status: AC
  Filled 2022-10-12: qty 5

## 2022-10-12 MED ORDER — DEXAMETHASONE SODIUM PHOSPHATE 10 MG/ML IJ SOLN
INTRAMUSCULAR | Status: AC
  Filled 2022-10-12: qty 1

## 2022-10-12 MED ORDER — ONDANSETRON HCL 4 MG/2ML IJ SOLN
INTRAMUSCULAR | Status: DC | PRN
  Administered 2022-10-12: 4 mg via INTRAVENOUS

## 2022-10-12 MED ORDER — ONDANSETRON HCL 4 MG/2ML IJ SOLN: 4.0000 mg | Freq: Four times a day (QID) | INTRAMUSCULAR | Status: DC | PRN

## 2022-10-12 MED ORDER — CHLORHEXIDINE GLUCONATE CLOTH 2 % EX PADS: 6.0000 | MEDICATED_PAD | Freq: Once | CUTANEOUS | Status: DC

## 2022-10-12 MED ORDER — METHYLENE BLUE 1 % INJ SOLN
INTRAVENOUS | Status: DC | PRN
  Administered 2022-10-12: 2 mL

## 2022-10-12 MED ORDER — SODIUM CHLORIDE (PF) 0.9 % IJ SOLN
INTRAMUSCULAR | Status: DC | PRN
  Administered 2022-10-12: 15 mL

## 2022-10-12 MED ORDER — PROPOFOL 10 MG/ML IV BOLUS
INTRAVENOUS | Status: AC
  Filled 2022-10-12: qty 20

## 2022-10-12 MED ORDER — SODIUM CHLORIDE 0.9 % IV SOLN
2.0000 g | INTRAVENOUS | Status: AC
  Administered 2022-10-12: 2 g via INTRAVENOUS
  Filled 2022-10-12: qty 2

## 2022-10-12 MED ORDER — STERILE WATER FOR INJECTION IJ SOLN
INTRAMUSCULAR | Status: AC
  Filled 2022-10-12: qty 10

## 2022-10-12 MED ORDER — ALBUMIN HUMAN 5 % IV SOLN: INTRAVENOUS | Status: DC | PRN

## 2022-10-12 MED ORDER — ROCURONIUM BROMIDE 10 MG/ML (PF) SYRINGE
PREFILLED_SYRINGE | INTRAVENOUS | Status: AC
  Filled 2022-10-12: qty 10

## 2022-10-12 MED ORDER — OXYCODONE HCL 5 MG PO TABS: 5.0000 mg | ORAL_TABLET | Freq: Once | ORAL | Status: DC | PRN

## 2022-10-12 MED ORDER — ONDANSETRON HCL 4 MG/2ML IJ SOLN
INTRAMUSCULAR | Status: AC
  Filled 2022-10-12: qty 2

## 2022-10-12 MED ORDER — CHLORHEXIDINE GLUCONATE 0.12 % MT SOLN
15.0000 mL | Freq: Once | OROMUCOSAL | Status: AC
  Administered 2022-10-12: 15 mL via OROMUCOSAL

## 2022-10-12 MED ORDER — ACETAMINOPHEN 500 MG PO TABS
1000.0000 mg | ORAL_TABLET | Freq: Four times a day (QID) | ORAL | Status: DC
  Administered 2022-10-12 – 2022-10-17 (×18): 1000 mg via ORAL
  Filled 2022-10-12 (×19): qty 2

## 2022-10-12 MED ORDER — POLYETHYLENE GLYCOL 3350 17 GM/SCOOP PO POWD: 1.0000 | Freq: Once | ORAL | Status: DC

## 2022-10-12 MED ORDER — NEOMYCIN SULFATE 500 MG PO TABS: 1000.0000 mg | ORAL_TABLET | ORAL | Status: DC

## 2022-10-12 MED ORDER — TRAMADOL HCL 50 MG PO TABS
50.0000 mg | ORAL_TABLET | Freq: Four times a day (QID) | ORAL | Status: DC | PRN
  Administered 2022-10-12 – 2022-10-13 (×2): 50 mg via ORAL
  Filled 2022-10-12 (×2): qty 1

## 2022-10-12 MED ORDER — PHENYLEPHRINE HCL-NACL 20-0.9 MG/250ML-% IV SOLN
INTRAVENOUS | Status: DC | PRN
  Administered 2022-10-12: 30 ug/min via INTRAVENOUS

## 2022-10-12 MED ORDER — ACETAMINOPHEN 500 MG PO TABS
1000.0000 mg | ORAL_TABLET | ORAL | Status: AC
  Administered 2022-10-12: 1000 mg via ORAL
  Filled 2022-10-12: qty 2

## 2022-10-12 MED ORDER — ENSURE PRE-SURGERY PO LIQD
296.0000 mL | Freq: Once | ORAL | Status: DC
  Filled 2022-10-12: qty 296

## 2022-10-12 MED ORDER — CALCIUM CARB-CHOLECALCIFEROL 600-5 MG-MCG PO TABS: ORAL_TABLET | Freq: Every day | ORAL | Status: DC

## 2022-10-12 MED ORDER — SUGAMMADEX SODIUM 200 MG/2ML IV SOLN
INTRAVENOUS | Status: DC | PRN
  Administered 2022-10-12: 200 mg via INTRAVENOUS

## 2022-10-12 MED ORDER — HYDROMORPHONE HCL 1 MG/ML IJ SOLN
INTRAMUSCULAR | Status: DC | PRN
  Administered 2022-10-12: .25 mg via INTRAVENOUS
  Administered 2022-10-12: .5 mg via INTRAVENOUS
  Administered 2022-10-12 (×2): .25 mg via INTRAVENOUS

## 2022-10-12 MED ORDER — FENTANYL CITRATE (PF) 100 MCG/2ML IJ SOLN
INTRAMUSCULAR | Status: DC | PRN
  Administered 2022-10-12 (×3): 25 ug via INTRAVENOUS
  Administered 2022-10-12: 50 ug via INTRAVENOUS
  Administered 2022-10-12: 100 ug via INTRAVENOUS
  Administered 2022-10-12: 25 ug via INTRAVENOUS

## 2022-10-12 MED ORDER — STERILE WATER FOR INJECTION IJ SOLN
INTRAMUSCULAR | Status: DC | PRN
  Administered 2022-10-12: 15 mL

## 2022-10-12 MED ORDER — LIDOCAINE HCL (PF) 2 % IJ SOLN
INTRAMUSCULAR | Status: DC | PRN
  Administered 2022-10-12: 1.5 mg/kg/h via INTRADERMAL

## 2022-10-12 MED ORDER — METHYLENE BLUE 1 % INJ SOLN
INTRAVENOUS | Status: AC
  Filled 2022-10-12: qty 10

## 2022-10-12 MED ORDER — ALVIMOPAN 12 MG PO CAPS
12.0000 mg | ORAL_CAPSULE | Freq: Two times a day (BID) | ORAL | Status: DC
  Administered 2022-10-13 (×2): 12 mg via ORAL
  Filled 2022-10-12 (×2): qty 1

## 2022-10-12 MED ORDER — HYDROMORPHONE HCL 2 MG/ML IJ SOLN
INTRAMUSCULAR | Status: AC
  Filled 2022-10-12: qty 1

## 2022-10-12 MED ORDER — DEXMEDETOMIDINE HCL IN NACL 80 MCG/20ML IV SOLN
INTRAVENOUS | Status: DC | PRN
  Administered 2022-10-12 (×2): 8 ug via BUCCAL

## 2022-10-12 MED ORDER — BUPIVACAINE LIPOSOME 1.3 % IJ SUSP
INTRAMUSCULAR | Status: AC
  Filled 2022-10-12: qty 20

## 2022-10-12 MED ORDER — DIPHENHYDRAMINE HCL 12.5 MG/5ML PO ELIX
12.5000 mg | ORAL_SOLUTION | Freq: Four times a day (QID) | ORAL | Status: DC | PRN
  Administered 2022-10-13: 12.5 mg via ORAL
  Filled 2022-10-12: qty 5

## 2022-10-12 MED ORDER — STERILE WATER FOR IRRIGATION IR SOLN
Status: DC | PRN
  Administered 2022-10-12: 1000 mL

## 2022-10-12 MED ORDER — BUPIVACAINE-EPINEPHRINE 0.5% -1:200000 IJ SOLN
INTRAMUSCULAR | Status: DC | PRN
  Administered 2022-10-12: 15 mL

## 2022-10-12 MED ORDER — MIDAZOLAM HCL 5 MG/5ML IJ SOLN
INTRAMUSCULAR | Status: DC | PRN
  Administered 2022-10-12 (×2): .5 mg via INTRAVENOUS

## 2022-10-12 MED ORDER — ROCURONIUM BROMIDE 100 MG/10ML IV SOLN
INTRAVENOUS | Status: DC | PRN
  Administered 2022-10-12: 30 mg via INTRAVENOUS
  Administered 2022-10-12: 50 mg via INTRAVENOUS

## 2022-10-12 MED ORDER — ENSURE PRE-SURGERY PO LIQD
592.0000 mL | Freq: Once | ORAL | Status: DC
  Filled 2022-10-12: qty 592

## 2022-10-12 SURGICAL SUPPLY — 136 items
ADAPTER GOLDBERG URETERAL (ADAPTER) IMPLANT
ADH SKN CLS APL DERMABOND .7 (GAUZE/BANDAGES/DRESSINGS) ×1
ADPR CATH 15X14FR FL DRN BG (ADAPTER)
APL PRP STRL LF DISP 70% ISPRP (MISCELLANEOUS) ×1
APPLIER CLIP 5 13 M/L LIGAMAX5 (MISCELLANEOUS)
APPLIER CLIP ROT 10 11.4 M/L (STAPLE)
APR CLP MED LRG 11.4X10 (STAPLE)
APR CLP MED LRG 5 ANG JAW (MISCELLANEOUS)
BAG COUNTER SPONGE SURGICOUNT (BAG) IMPLANT
BAG SPNG CNTER NS LX DISP (BAG)
BAG URO CATCHER STRL LF (MISCELLANEOUS) ×2 IMPLANT
BLADE EXTENDED COATED 6.5IN (ELECTRODE) ×2 IMPLANT
CANNULA REDUC XI 12-8 STAPL (CANNULA) ×1
CANNULA REDUCER 12-8 DVNC XI (CANNULA) ×2 IMPLANT
CATH URETL OPEN END 6FR 70 (CATHETERS) IMPLANT
CELLS DAT CNTRL 66122 CELL SVR (MISCELLANEOUS) IMPLANT
CHLORAPREP W/TINT 26 (MISCELLANEOUS) ×2 IMPLANT
CLIP APPLIE 5 13 M/L LIGAMAX5 (MISCELLANEOUS) IMPLANT
CLIP APPLIE ROT 10 11.4 M/L (STAPLE) IMPLANT
CLIP LIGATING HEM O LOK PURPLE (MISCELLANEOUS) IMPLANT
CLIP LIGATING HEMO O LOK GREEN (MISCELLANEOUS) IMPLANT
CLOTH BEACON ORANGE TIMEOUT ST (SAFETY) ×2 IMPLANT
COVER SURGICAL LIGHT HANDLE (MISCELLANEOUS) ×4 IMPLANT
COVER TIP SHEARS 8 DVNC (MISCELLANEOUS) ×2 IMPLANT
COVER TIP SHEARS 8MM DA VINCI (MISCELLANEOUS) ×1
DEFOGGER SCOPE WARMER CLEARIFY (MISCELLANEOUS) ×2 IMPLANT
DERMABOND ADVANCED .7 DNX12 (GAUZE/BANDAGES/DRESSINGS) IMPLANT
DEVICE TROCAR PUNCTURE CLOSURE (ENDOMECHANICALS) IMPLANT
DRAIN CHANNEL 19F RND (DRAIN) ×2 IMPLANT
DRAPE ARM DVNC X/XI (DISPOSABLE) ×8 IMPLANT
DRAPE COLUMN DVNC XI (DISPOSABLE) ×2 IMPLANT
DRAPE DA VINCI XI ARM (DISPOSABLE) ×4
DRAPE DA VINCI XI COLUMN (DISPOSABLE) ×1
DRAPE SURG IRRIG POUCH 19X23 (DRAPES) ×2 IMPLANT
DRSG OPSITE POSTOP 4X10 (GAUZE/BANDAGES/DRESSINGS) IMPLANT
DRSG OPSITE POSTOP 4X6 (GAUZE/BANDAGES/DRESSINGS) IMPLANT
DRSG OPSITE POSTOP 4X8 (GAUZE/BANDAGES/DRESSINGS) IMPLANT
DRSG TEGADERM 2-3/8X2-3/4 SM (GAUZE/BANDAGES/DRESSINGS) ×10 IMPLANT
DRSG TEGADERM 4X4.75 (GAUZE/BANDAGES/DRESSINGS) ×2 IMPLANT
ELECT REM PT RETURN 15FT ADLT (MISCELLANEOUS) ×2 IMPLANT
ENDOLOOP SUT PDS II  0 18 (SUTURE)
ENDOLOOP SUT PDS II 0 18 (SUTURE) IMPLANT
EVACUATOR SILICONE 100CC (DRAIN) ×2 IMPLANT
GAUZE SPONGE 2X2 8PLY STRL LF (GAUZE/BANDAGES/DRESSINGS) ×2 IMPLANT
GAUZE SPONGE 4X4 12PLY STRL (GAUZE/BANDAGES/DRESSINGS) IMPLANT
GLOVE BIO SURGEON STRL SZ7.5 (GLOVE) ×6 IMPLANT
GLOVE INDICATOR 8.0 STRL GRN (GLOVE) ×6 IMPLANT
GLOVE SURG LX STRL 7.5 STRW (GLOVE) ×2 IMPLANT
GOWN SRG XL LVL 4 BRTHBL STRL (GOWNS) ×2 IMPLANT
GOWN STRL NON-REIN XL LVL4 (GOWNS) ×1
GOWN STRL REUS W/ TWL XL LVL3 (GOWN DISPOSABLE) ×10 IMPLANT
GOWN STRL REUS W/TWL XL LVL3 (GOWN DISPOSABLE) ×5
GRASPER SUT TROCAR 14GX15 (MISCELLANEOUS) IMPLANT
GUIDEWIRE ANG ZIPWIRE 038X150 (WIRE) IMPLANT
GUIDEWIRE STR DUAL SENSOR (WIRE) IMPLANT
HOLDER FOLEY CATH W/STRAP (MISCELLANEOUS) ×2 IMPLANT
IRRIG SUCT STRYKERFLOW 2 WTIP (MISCELLANEOUS) ×1
IRRIGATION SUCT STRKRFLW 2 WTP (MISCELLANEOUS) ×2 IMPLANT
KIT PROCEDURE DA VINCI SI (MISCELLANEOUS) ×2
KIT PROCEDURE DVNC SI (MISCELLANEOUS) ×2 IMPLANT
KIT TURNOVER KIT A (KITS) IMPLANT
MANIFOLD NEPTUNE II (INSTRUMENTS) ×2 IMPLANT
NDL INSUFFLATION 14GA 120MM (NEEDLE) ×2 IMPLANT
NEEDLE INSUFFLATION 14GA 120MM (NEEDLE) ×1 IMPLANT
PACK CARDIOVASCULAR III (CUSTOM PROCEDURE TRAY) ×2 IMPLANT
PACK COLON (CUSTOM PROCEDURE TRAY) ×2 IMPLANT
PACK CYSTO (CUSTOM PROCEDURE TRAY) ×2 IMPLANT
PAD POSITIONING PINK XL (MISCELLANEOUS) ×2 IMPLANT
PENCIL SMOKE EVACUATOR (MISCELLANEOUS) IMPLANT
PROTECTOR NERVE ULNAR (MISCELLANEOUS) ×4 IMPLANT
RELOAD STAPLE 45 3.5 BLU DVNC (STAPLE) IMPLANT
RELOAD STAPLE 45 4.3 GRN DVNC (STAPLE) IMPLANT
RELOAD STAPLE 60 3.5 BLU DVNC (STAPLE) IMPLANT
RELOAD STAPLE 60 4.3 GRN DVNC (STAPLE) IMPLANT
RELOAD STAPLER 3.5X45 BLU DVNC (STAPLE) IMPLANT
RELOAD STAPLER 3.5X60 BLU DVNC (STAPLE) IMPLANT
RELOAD STAPLER 4.3X45 GRN DVNC (STAPLE) ×1 IMPLANT
RELOAD STAPLER 4.3X60 GRN DVNC (STAPLE) IMPLANT
RETRACTOR WND ALEXIS 18 MED (MISCELLANEOUS) IMPLANT
RTRCTR WOUND ALEXIS 18CM MED (MISCELLANEOUS)
SCISSORS LAP 5X35 DISP (ENDOMECHANICALS) IMPLANT
SEAL CANN UNIV 5-8 DVNC XI (MISCELLANEOUS) ×8 IMPLANT
SEAL XI 5MM-8MM UNIVERSAL (MISCELLANEOUS) ×4
SEALER VESSEL DA VINCI XI (MISCELLANEOUS) ×1
SEALER VESSEL EXT DVNC XI (MISCELLANEOUS) ×2 IMPLANT
SET IRRIG Y TYPE TUR BLADDER L (SET/KITS/TRAYS/PACK) IMPLANT
SLEEVE ADV FIXATION 5X100MM (TROCAR) IMPLANT
SOLUTION ELECTROLUBE (MISCELLANEOUS) ×2 IMPLANT
SPIKE FLUID TRANSFER (MISCELLANEOUS) ×2 IMPLANT
STAPLER 60 DA VINCI SURE FORM (STAPLE) ×1
STAPLER 60 SUREFORM DVNC (STAPLE) IMPLANT
STAPLER CANNULA SEAL DVNC XI (STAPLE) ×2 IMPLANT
STAPLER CANNULA SEAL XI (STAPLE) ×1
STAPLER ECHELON POWER CIR 29 (STAPLE) IMPLANT
STAPLER ECHELON POWER CIR 31 (STAPLE) IMPLANT
STAPLER PROXIMATE 75MM BLUE (STAPLE) IMPLANT
STAPLER RELOAD 3.5X45 BLU DVNC (STAPLE)
STAPLER RELOAD 3.5X45 BLUE (STAPLE)
STAPLER RELOAD 3.5X60 BLU DVNC (STAPLE)
STAPLER RELOAD 3.5X60 BLUE (STAPLE)
STAPLER RELOAD 4.3X45 GREEN (STAPLE) ×1
STAPLER RELOAD 4.3X45 GRN DVNC (STAPLE) ×1
STAPLER RELOAD 4.3X60 GREEN (STAPLE)
STAPLER RELOAD 4.3X60 GRN DVNC (STAPLE)
STOPCOCK 4 WAY LG BORE MALE ST (IV SETS) ×4 IMPLANT
SURGILUBE 2OZ TUBE FLIPTOP (MISCELLANEOUS) ×2 IMPLANT
SUT MNCRL AB 4-0 PS2 18 (SUTURE) ×2 IMPLANT
SUT PDS AB 1 CT1 27 (SUTURE) IMPLANT
SUT PDS AB 1 TP1 96 (SUTURE) IMPLANT
SUT PROLENE 0 CT 2 (SUTURE) IMPLANT
SUT PROLENE 2 0 KS (SUTURE) ×2 IMPLANT
SUT PROLENE 2 0 SH DA (SUTURE) IMPLANT
SUT SILK 2 0 (SUTURE)
SUT SILK 2 0 SH CR/8 (SUTURE) IMPLANT
SUT SILK 2-0 18XBRD TIE 12 (SUTURE) IMPLANT
SUT SILK 3 0 (SUTURE) ×1
SUT SILK 3 0 SH CR/8 (SUTURE) ×2 IMPLANT
SUT SILK 3-0 18XBRD TIE 12 (SUTURE) ×2 IMPLANT
SUT V-LOC BARB 180 2/0GR6 GS22 (SUTURE)
SUT VIC AB 3-0 SH 18 (SUTURE) IMPLANT
SUT VIC AB 3-0 SH 27 (SUTURE)
SUT VIC AB 3-0 SH 27XBRD (SUTURE) IMPLANT
SUT VICRYL 0 UR6 27IN ABS (SUTURE) ×2 IMPLANT
SUTURE V-LC BRB 180 2/0GR6GS22 (SUTURE) IMPLANT
SYR 10ML LL (SYRINGE) ×2 IMPLANT
SYS LAPSCP GELPORT 120MM (MISCELLANEOUS)
SYS WOUND ALEXIS 18CM MED (MISCELLANEOUS) ×1
SYSTEM LAPSCP GELPORT 120MM (MISCELLANEOUS) IMPLANT
SYSTEM WOUND ALEXIS 18CM MED (MISCELLANEOUS) ×2 IMPLANT
TAPE UMBILICAL 1/8 X36 TWILL (MISCELLANEOUS) ×2 IMPLANT
TOWEL OR NON WOVEN STRL DISP B (DISPOSABLE) ×2 IMPLANT
TRAY FOLEY MTR SLVR 16FR STAT (SET/KITS/TRAYS/PACK) ×2 IMPLANT
TROCAR ADV FIXATION 5X100MM (TROCAR) ×2 IMPLANT
TUBING CONNECTING 10 (TUBING) ×6 IMPLANT
TUBING INSUFFLATION 10FT LAP (TUBING) ×2 IMPLANT
TUBING UROLOGY SET (TUBING) IMPLANT

## 2022-10-12 NOTE — Anesthesia Procedure Notes (Signed)
Procedure Name: Intubation Date/Time: 10/12/2022 11:36 AM  Performed by: Garrel Ridgel, CRNAPre-anesthesia Checklist: Patient identified, Emergency Drugs available, Suction available and Patient being monitored Patient Re-evaluated:Patient Re-evaluated prior to induction Oxygen Delivery Method: Circle system utilized Preoxygenation: Pre-oxygenation with 100% oxygen Induction Type: IV induction Ventilation: Mask ventilation without difficulty Laryngoscope Size: Miller and 2 Grade View: Grade I Tube type: Oral Tube size: 7.0 mm Number of attempts: 1 Airway Equipment and Method: Stylet and Oral airway Placement Confirmation: ETT inserted through vocal cords under direct vision, positive ETCO2 and breath sounds checked- equal and bilateral Secured at: 21 cm Tube secured with: Tape Dental Injury: Teeth and Oropharynx as per pre-operative assessment

## 2022-10-12 NOTE — Consult Note (Signed)
H&P Physician requesting consult: Nadeen Landau  Chief Complaint: Diverticulitis  History of Present Illness: 71 year old female with diverticulitis presents for robotic partial colectomy.  Cystoscopy with bilateral ureteral catheterization with instillation of firefly requested.  Past Medical History:  Diagnosis Date   Arthritis    Carotid artery stenosis    Chronic back pain    Diverticulitis    Hyperlipidemia    Hypertension    Numbness and tingling    hands bilat and lower legs bilat comes and goes    Rheumatoid arthritis (HCC)    polymyalgia rheumatica   UTI (lower urinary tract infection)    Past Surgical History:  Procedure Laterality Date   SHOULDER OPEN ROTATOR CUFF REPAIR Left 11/10/2015   Procedure: LEFT SHOULDER MINI OPEN ROTATOR CUFF REPAIR AND DISTAL CLAVICAL RESECTION;  Surgeon: Susa Day, MD;  Location: [facility];  Service: Orthopedics;  Laterality: Left;    Home Medications:  Medications Prior to Admission  Medication Sig Dispense Refill Last Dose   Calcium Carb-Cholecalciferol (CALCIUM 600 + D PO) Take 1 tablet by mouth daily.   10/10/2022   cholecalciferol (VITAMIN D3) 25 MCG (1000 UNIT) tablet Take 2,000 Units by mouth daily.   10/10/2022   predniSONE (DELTASONE) 1 MG tablet Take 2 mg by mouth daily.   10/10/2022   Allergies: No Known Allergies  Family History  Problem Relation Age of Onset   Cancer Mother    Cancer Father    Colon cancer Neg Hx    Esophageal cancer Neg Hx    Rectal cancer Neg Hx    Stomach cancer Neg Hx    Social History:  reports that she has never smoked. She has never used smokeless tobacco. She reports that she does not drink alcohol and does not use drugs.  ROS: A complete review of systems was performed.  All systems are negative except for pertinent findings as noted. ROS   Physical Exam:  Vital signs in last 24 hours: Temp:  [97.6 F (36.4 C)] 97.6 F (36.4 C) (12/08 0949) Pulse Rate:  [86] 86 (12/08 0949) Resp:   [15] 15 (12/08 0949) BP: (138)/(96) 138/96 (12/08 0949) SpO2:  [98 %] 98 % (12/08 0949) Weight:  [68 kg] 68 kg (12/08 1016) General:  Alert and oriented, No acute distress HEENT: Normocephalic, atraumatic Neck: No JVD or lymphadenopathy Cardiovascular: Regular rate and rhythm Lungs: Regular rate and effort Abdomen: Soft, nontender, nondistended, no abdominal masses Back: No CVA tenderness Extremities: No edema Neurologic: Grossly intact  Laboratory Data:  No results found for this or any previous visit (from the past 24 hour(s)). No results found for this or any previous visit (from the past 240 hour(s)). Creatinine: No results for input(s): "CREATININE" in the last 168 hours.  Impression/Assessment:  Diverticulitis  Plan:  Proceed with cystoscopy with bilateral ureteral catheterization with instillation of firefly.  Risk benefits discussed including but not limited to bleeding, infection, ureteral injury.  Marton Redwood, III 10/12/2022, 11:03 AM

## 2022-10-12 NOTE — Transfer of Care (Signed)
Immediate Anesthesia Transfer of Care Note  Patient: Jenny Glass  Procedure(s) Performed: ROBOTIC SIGMOIDECTOMY WITH LOW ANTERIOR RESECTION, OSTOMY CREATION CYSTOSCOPY with FIREFLY INJECTION  Patient Location: PACU  Anesthesia Type:General  Level of Consciousness: awake, drowsy, and responds to stimulation  Airway & Oxygen Therapy: Patient Spontanous Breathing and Patient connected to face mask oxygen  Post-op Assessment: Report given to RN and Post -op Vital signs reviewed and stable  Post vital signs: Reviewed and stable  Last Vitals:  Vitals Value Taken Time  BP 130/76 10/12/22 1521  Temp    Pulse 82 10/12/22 1524  Resp 16 10/12/22 1525  SpO2 100 % 10/12/22 1524  Vitals shown include unvalidated device data.  Last Pain:  Vitals:   10/12/22 1016  TempSrc:   PainSc: 0-No pain         Complications: No notable events documented.

## 2022-10-12 NOTE — Op Note (Addendum)
PATIENT: Jenny Glass  71 y.o. female  Patient Care Team: Jenny Seashore, MD as PCP - General (Internal Medicine)  PREOP DIAGNOSIS: DIVERTICULITIS  POSTOP DIAGNOSIS: DIVERTICULITIS  PROCEDURE:  Robotic assisted low anterior resection with end colostomy Robotic assisted left salpingo-oophorectomy Drainage of intra-abdominal abscess 4 x 4 cm Bilateral transversus abdominus plane (TAP) blocks  SURGEON: Jenny Mt. Taaj Hurlbut, MD  ASSISTANT: Jenny Ruff, MD  ANESTHESIA: General endotracheal  EBL: 250 mL Total I/O In: 2487.9 [I.V.:1887.9; IV Piggyback:600] Out: 600 [Urine:350; Blood:250]  DRAINS: None  SPECIMEN: Rectosigmoid colon stitch proximal with associated left fallopian tube and ovary  COUNTS: Sponge, needle and instrument counts were reported correct x2  FINDINGS: Significant inflammatory adhesions about her sigmoid colon related to her likely smoldering diverticulitis.  Intra-abdominal abscess was also encountered and drained containing approximately 15 cc of milky Jenny Glass pus.  This was in the vicinity of her pelvic sigmoid.  Densely involving her left fallopian tube and ovary which were unable to be separated and therefore were removed en bloc.  Inflammation went all way down to her rectum down to her pelvic floor/peritoneal reflection and posterior uterus.  Given all the inflammatory type changes as well as the rectal involvement all way down to her mid rectum, it was felt that Hartman's type resection would be necessary as opposed to any sort of primary anastomosis with/without diverting ileostomy.  Proximal sigmoid colostomy fashion in the left lower quadrant and her previously marked ostomy marking site.   NARRATIVE: Informed consent was verified. She was taken to the operating room, placed supine on the operating table and SCD's were applied. General endotracheal anesthesia was induced without difficulty. She was then positioned in the lithotomy position with Allen  stirrups.  Pressure points were evaluated and padded.  She was secured to the operating table with arms tucked and padded. Jenny Glass with [facility] scrubbed for his portion of the procedure.  Please refer to his note for details regarding this portion of the operation. The abdomen was then prepped and draped in the standard sterile fashion. Surgical timeout was called indicating the correct patient, procedure, positioning and need for preoperative antibiotics.   An OG tube was placed by anesthesia and confirmed to be to suction.  At Palmer's point, a stab incision was created and the Veress needle was introduced into the peritoneal cavity on the first attempt.  Intraperitoneal location was confirmed by the aspiration and saline drop test.  Pneumoperitoneum was established to a maximum pressure of 15 mmHg using CO2.  Following this, the abdomen was marked for planned trocar sites.  Just to the right and cephalad to the umbilicus, an 8 mm incision was created and an 8 mm blunt tipped robotic trocar was cautiously placed into the peritoneal cavity.  The laparoscope was inserted and demonstrated no evidence of trocar site nor Veress needle site complications.  The Veress needle was removed.  Bilateral transversus abdominis plane blocks were then created using a dilute mixture of Exparel with Marcaine.  3 additional 8 mm robotic trochars were placed under direct visualization roughly in a line extending from the right ASIS towards the left upper quadrant. The bladder was inspected and noted to be at/below the pubic symphysis.  Staying 3 fingerbreadths above the pubic symphysis, an incision was created and the 12 mm robotic trocar inserted directed cephalad into the peritoneal cavity under direct visualization.  An additional 5 mm assist port was placed in the right lateral abdomen under direct visualization.  The abdomen  was surveyed and there was a chronically inflamed appearing sigmoid within the pelvis with  associated omentum. She was positioned in Trendelenburg with the left side tilted slightly up.  Small bowel was carefully retracted out of the pelvis.  The robot was then docked and I went to the console.  The sigmoid was densely adherent to the pelvis including her posterior uterus.  We began by mobilizing the sigmoid from the back wall of the uterus.  This approach was continued approximately.  She had a hairpin turn of sigmoid in her pelvis.  We attempted to separate the leaves of the sigmoid and in doing so I encountered a large pelvic abscess that contained approximately 15 cc of purulent fluid.  There is also 1 loop of small bowel pulled into this process in the pelvis that were able to separate carefully without any injury.  CASE DATA:  Type of patient?: Elective [facility] Private Case  Status of Case? Elective Scheduled  Infection Present At Time Of Surgery (PATOS)?  PUS at the pelvic sigmoid* Purulence in this area. Phlegmon involving the mid sigmoid colon.   We worked to mobilize in the sigmoid colon from her left tube and ovary which were densely involved.  We therefore transition to a medial to lateral type approach.  We were able to visualize the right ureter through the mesenteric fat.  The IMA pedicle was identified.  This is carefully elevated.  A plane is developed after incising the peritoneum medial to the IMA such that were able to obtain the appropriate retroperitoneal plan dissection.  In doing this, we are able to identify the gonadal vessels and the left ureter well.  The left ureter was carefully swept "down."  The gonadal vessels were also dissected free.  There was a large phlegmon involving her pelvic sidewall just lateral to this.  We are able to mobilize this to the extent possible from this approach.  We then turned our attention to ligating and dividing the IMA vessel.  This is circumferentially dissected.  We again confirmed where the gonadal vessels and left ureter are and  after doing this and obtaining circumferential control of the IMA, it is ligated and divided using the vessel sealer device.  The stump is inspected and noted to be hemostatic.  We then turned our attention to a lateral to medial type approach.  Working more proximally at the level of the mid descending colon, Jenny Glass line of Toldt was incised in this plane of dissection is continued towards the pelvis mobilized the associated mesocolon medially.  We were able to ultimately dissect free much of her sigmoid colon.  The left tube and ovary are densely involved in this phlegmon.  They are unable to be preserved.  The left ureter is reidentified and confirmed to be well away from our plane of dissection.  The gonadal vessels were taken close to the associated tube and ovary in order to allow them to be incorporated with the specimen.  We carried this plan dissection into her pelvis.  We were able to carefully separate the sigmoid from the posterior wall of the lower portion of the uterus.  On the right side, it is also adherent to her right tube and ovary but were able to dissect this free.  Ultimately were able to deliver this hairpin loop of sigmoid out of her pelvis.  The pelvis was then inspected and we confirmed hemostasis.  The phlegmon involved much of the anterior wall of her proximal and mid  rectum.  These tissues are also thickened in appearance.  Judging by the tissue quality, we do not feel it would be safe to ultimately perform a colorectal anastomosis even down to the level of the peritoneal reflection.  Therefore, we planned to ultimately proceed with a end colostomy type configuration.    At this point, we ensured that there was adequate mobilization of the descending colon to facilitate at least the proximal sigmoid reaching the level of the planned colostomy which there was.  We turned our attention to ligating the mesentery out to the level of planned proximal transection of the sigmoid.  At this  location the sigmoid is healthy and supple.  The mesentery was cleared with vessel sealer.  The distal point of transection is identified in the pelvis at the location of the proximal rectum.  This location, the 10 A blade and the loss of appendices epiploica.  Anatomically, it clearly corresponds to the proximal rectum.  The associated mesorectum was cleared out to the point point of transection.  A 12 mm port is utilized at her Pfannenstiel location to past the green load of the robotic 60 mm stapler.  This is then applied at the point of distal transection closed, held, and fired.  Again, the tissue here is somewhat thickened but were able to achieve good division with appropriately seated staples.  The staple line is inspected noted to be well-perfused in appearance and hemostatic.  Given the degree of inflammation in her pelvis, we opted to backfill her bladder with dilute methylene blue to also ensure there is no evident involvement of her bladder.  The bladder was well-distended and there was no evident involvement or extravasation of any blue fluid.  This was placed back to gravity.  Attention was turned to the extracorporeal portion of the procedure.  The robot was undocked.  I scrubbed back in.  Using the 12 mm trocar site, a Pfannenstiel incision was created and incorporated the fascial opening through the 12 mm port site.  The rectus fascia was incised and then elevated.  The rectus muscle was mobilized free of the overlying fascia.  The peritoneum was incised in the midline well above the location of the bladder.  An Jackson wound protector was placed.  Towels were placed around the field.  The divided colon was passed through the wound protector.  The point of proximal division was identified and was again on a healthy segment of supple colon with a palpable pulse in the mesentery. This was pink in color.  A GIA 75 blue load stapler was used to divide this level.  The specimen is then passed off  including the associated tube and ovary on the left.  The specimen is oriented with a stitch on the proximal staple line.  The pelvis is irrigated and hemostasis is verified.  We are able to reinspect 1 loop of small bowel that was also involved in the pelvic phlegmon and confirmed that this is free of any apparent injury.  There is no remaining abscess or purulence.  Therefore, we opted not to leave the drain.   Attention is directed to creating the colostomy.  At her previously marked site and left lower quadrant, we will the skin is excised.  Subcutaneous tissue divided electrocautery.  The rectus fascia is incised in a partial cruciate type manner and the underlying muscle is then bluntly spread.  We are able to carefully incise the peritoneum with my hand in place to prevent any underlying  injury.  This is approximately 2 fingerbreadths in diameter.  The colon is then passed through this.  A clamp was left on the staple of the colon.  Orientation is confirmed such there is no twisting.   The ports were removed under direct visualization and hemostatic.  The [place] wound protector was then removed.    The Pfannenstiel peritoneum was closed with a running 0 Vicryl suture.  The rectus fascia was then closed using 2 running #1 PDS sutures.  The fascia was then palpated and noted to be completely closed.  Additional anesthetic was infiltrated at the Pfannenstiel site.  Sponge, needle, and instrument counts were reported correct x2. 4-0 Monocryl subcuticular suture was used to close the skin of all incision sites.  Dermabond was placed over all incisions.  Attention was directed at maturing the colostomy.  The stapling was excised.  The colostomy was then matured in a Brooke manner using 3-0 Vicryl sutures.  The colostomy is pink in appearance and widely patent.  All final counts were correct.   She was then taken out of lithotomy, awakened from anesthesia, extubated, and transferred to a stretcher for  transport to PACU in satisfactory condition having tolerated the procedure well.

## 2022-10-12 NOTE — Discharge Instructions (Signed)
POST OP INSTRUCTIONS AFTER COLON SURGERY  DIET: Be sure to include lots of fluids daily to stay hydrated - 64oz of water per day (8, 8 oz glasses).  Avoid fast food or heavy meals for the first couple of weeks as your are more likely to get nauseated. Avoid raw/uncooked fruits or vegetables for the first 4 weeks (its ok to have these if they are blended into smoothie form). If you have fruits/vegetables, make sure they are cooked until soft enough to mash on the roof of your mouth and chew your food well. Otherwise, diet as tolerated.  Take your usually prescribed home medications unless otherwise directed.  PAIN CONTROL: Pain is best controlled by a usual combination of three different methods TOGETHER: Ice/Heat Over the counter pain medication Prescription pain medication Most patients will experience some swelling and bruising around the surgical site.  Ice packs or heating pads (30-60 minutes up to 6 times a day) will help. Some people prefer to use ice alone, heat alone, alternating between ice & heat.  Experiment to what works for you.  Swelling and bruising can take several weeks to resolve.   It is helpful to take an over-the-counter pain medication regularly for the first few weeks: Ibuprofen (Motrin/Advil) - '200mg'$  tabs - take 3 tabs ('600mg'$ ) every 6 hours as needed for pain (unless you have been directed previously to avoid NSAIDs/ibuprofen) Acetaminophen (Tylenol) - you may take '650mg'$  every 6 hours as needed. You can take this with motrin as they act differently on the body. If you are taking a narcotic pain medication that has acetaminophen in it, do not take over the counter tylenol at the same time. NOTE: You may take both of these medications together - most patients  find it most helpful when alternating between the two (i.e. Ibuprofen at 6am, tylenol at 9am, ibuprofen at 12pm ..Marland Kitchen) A  prescription for pain medication should be given to you upon discharge.  Take your pain medication as  prescribed if your pain is not adequatly controlled with the over-the-counter pain reliefs mentioned above.  Avoid getting constipated.  Between the surgery and the pain medications, it is common to experience some constipation.  Increasing fluid intake and taking a fiber supplement (such as Metamucil, Citrucel, FiberCon, MiraLax, etc) 1-2 times a day regularly will usually help prevent this problem from occurring.  A mild laxative (prune juice, Milk of Magnesia, MiraLax, etc) should be taken according to package directions if there are no bowel movements after 48 hours.    Dressing: Your incisions are covered in Dermabond which is like sterile superglue for the skin. This will come off on it's own in a couple weeks. It is waterproof and you may bathe normally starting the day after your surgery in a shower. Avoid baths/pools/lakes/oceans until your wounds have fully healed. Colostomy care: Home health nursing has been arranged to  help bridge the transition to home. Empty/change as instructed with our wound ostomy team. Written instructions have been separately provided. Generally patients change the physical appliance 2-3 times per week and empty the bag a few times per day when it is ~2/3 full. If it is filling with gas, "burping" the appliance to allow this to pass can help avoid bag leaks.  ACTIVITIES as tolerated:   Avoid heavy lifting (>10lbs or 1 gallon of milk) for the next 6 weeks. You may resume regular daily activities as tolerated--such as daily self-care, walking, climbing stairs--gradually increasing activities as tolerated.  If you can walk  30 minutes without difficulty, it is safe to try more intense activity such as jogging, treadmill, bicycling, low-impact aerobics.  DO NOT PUSH THROUGH PAIN.  Let pain be your guide: If it hurts to do something, don't do it. You may drive when you are no longer taking prescription pain medication, you can comfortably wear a seatbelt, and you can safely  maneuver your car and apply brakes.  FOLLOW UP in our office Please call [facility] at [phone] to set up an appointment to see your surgeon in the office for a follow-up appointment approximately 2 weeks after your surgery. Make sure that you call for this appointment the day you arrive home to insure a convenient appointment time.  9. If you have disability or family leave forms that need to be completed, you may have them completed by your primary care physician's office; for return to work instructions, please ask our office staff and they will be happy to assist you in obtaining this documentation   When to call us [phone]: Poor pain control Reactions / problems with new medications (rash/itching, etc)  Fever over 101.5 F (38.5 C) Inability to urinate Nausea/vomiting Worsening swelling or bruising Continued bleeding from incision. Increased pain, redness, or drainage from the incision  The clinic staff is available to answer your questions during regular business hours (8:30am-5pm).  Please don't hesitate to call and ask to speak to one of our nurses for clinical concerns.   A surgeon from [facility] is always on call at the hospitals   If you have a medical emergency, go to the nearest emergency room or call 911.  [facility], [place] [address], [place], [place], [place]  [address] MAIN: [phone] FAX: [phone] [email or web address]

## 2022-10-12 NOTE — Op Note (Signed)
Operative Note  Preoperative diagnosis:  1.  Diverticulitis  Postoperative diagnosis: 1.  Diverticulitis  Procedure(s): 1.  Cystoscopy with bilateral ureteral catheterization with bilateral instillation of firefly  Surgeon: Link Snuffer, MD  Assistants: None  Anesthesia: General  Complications: None immediate  EBL: Minimal  Specimens: 1.  None  Drains/Catheters: 1.  Foley catheter  Intraoperative findings: Normal urethra and bladder mucosa  Indication: 71 year old female presents for partial colectomy for diverticulitis.  Bilateral ureteral instillation of firefly requested.  Description of procedure:  The patient was identified and consent was obtained.  The patient was taken to the operating room and placed in the supine position.  The patient was placed under general anesthesia.  Perioperative antibiotics were administered.  The patient was placed in dorsal lithotomy.  Patient was prepped and draped in a standard sterile fashion and a timeout was performed.  A 21 French rigid cystoscope was advanced into the urethra and into the bladder.  Complete cystoscopy was performed with no abnormal findings.  The left ureter was cannulated with a sensor wire which was advanced up to the kidney under fluoroscopic guidance.  An open ended ureteral catheter was advanced over the wire and the wire was withdrawn.  7.5 cc of firefly was instilled into the left ureter.  The same was performed on the right.  Scope was withdrawn.  Foley catheter was placed.  This concluded my portion of the operation.  Plan: As per general surgery

## 2022-10-12 NOTE — H&P (Signed)
CC: Here today for surgery  HPI: Jenny Glass is an 71 y.o. female with history of HTN, HLD, C diff, PMR (on 2 mg/day of prednisone) whom is seen in the office today as a referral by Dr. Ashby Glass for evaluation of complicated diverticulitis.   Colonoscopy 04/07/12 - Dr. Collene Glass -small internal hemorrhoids, extensive sigmoid diverticulosis. Otherwise normal.  CT A/P 07/11/22 1. Acute sigmoid colon diverticulitis. Small peridiverticular abscess/fluid collection, 1.9 cm in greatest dimension. No other complicating features. 2. No other acute abnormality within the abdomen or pelvis.   CT A/P 07/15/22 No significant change in moderate sigmoid diverticulitis.  Slight increase in size of 2.6 cm diverticular abscess in central sigmoid mesentery.  Increased mild bilateral hydroureteronephrosis due to involvement of both ureters by diverticulitis.  Stable moderate hiatal hernia.  CT A/P 07/23/22 Imaging improvement. Less inflammatory change of the sigmoid colon itself. Less edema in the adjacent fat. Decreasing size of the small fluid collection in the sigmoid mesentery, 1 x 1.5 cm today compared with 1.5 x 2.5 cm previously.  CT A/P 08/15/22 1. Complicated sigmoid Diverticulitis with unresolved sigmoid inflammation (perhaps mildly improved since 07/23/2022). Secondary involvement of and architectural distortion on both the bladder fundus and dorsal uterus, but no evidence of fistula. No abscess or drainable fluid now. Trace pelvic free fluid is likely reactive.  2. No bowel obstruction or new abnormality in the abdomen or pelvis. Small to moderate gastric hiatal hernia. Chronic right renal cortical scarring and atrophy. Mild calcified aortic atherosclerosis.  INTERVAL HX Referred to see me and saw my in the office 08/23/22. Since being discharged 07/27/2022, she reports she has been doing better than when she was in the hospital. Aside from her low-dose prednisone, she has been  off all other treatment for her PMR. She denies any abdominal pain at present. No nausea or vomiting. No fever or chills. She was prescribed a prolonged course of antibiotics that she is completed. Having regular bowel movements. The diarrhea that she experienced while admitted has improved/resolved. No blood in her stool. Tolerated her bowel prep with satisfactory result. States she is ready for surgery.  Cscope with Dr. Lorenso Glass 09/17/22 - One 2 mm polyp in the cecum, removed with a cold biopsy forceps. Resected and retrieved. - Three 3 to 4 mm polyps in the transverse colon and in the ascending colon, removed with a cold snare. Resected and retrieved. - Diverticulosis with narrowing in the sigmoid colon and in the descending colon. - Non-bleeding internal hemorrhoids. - ADDENDUM: Due to patient discomfort after the procedure, patient was brought back to the endoscopy suite and air was suctioned from the right side of the colon.   PMH: HTN, HLD, C diff, PMR (on 2 mg/day of prednisone)  PSH: BTL; denies any other abdominal or pelvic surgical history  FHx: Denies any known family history of colorectal, breast, endometrial or ovarian cancer  Social Hx: Denies use of tobacco/EtOH/illicit drug. She is here today with her husband   Past Medical History:  Diagnosis Date   Arthritis    Carotid artery stenosis    Chronic back pain    Diverticulitis    Hyperlipidemia    Hypertension    Numbness and tingling    hands bilat and lower legs bilat comes and goes    Rheumatoid arthritis (HCC)    polymyalgia rheumatica   UTI (lower urinary tract infection)     Past Surgical History:  Procedure Laterality Date   SHOULDER OPEN ROTATOR CUFF REPAIR  Left 11/10/2015   Procedure: LEFT SHOULDER MINI OPEN ROTATOR CUFF REPAIR AND DISTAL CLAVICAL RESECTION;  Surgeon: Susa Day, MD;  Location: [facility];  Service: Orthopedics;  Laterality: Left;    Family History  Problem Relation Age of Onset   Cancer  Mother    Cancer Father    Colon cancer Neg Hx    Esophageal cancer Neg Hx    Rectal cancer Neg Hx    Stomach cancer Neg Hx     Social:  reports that she has never smoked. She has never used smokeless tobacco. She reports that she does not drink alcohol and does not use drugs.  Allergies: No Known Allergies  Medications: I have reviewed the patient's current medications.  No results found for this or any previous visit (from the past 48 hour(s)).  No results found.  ROS - all of the below systems have been reviewed with the patient and positives are indicated with bold text General: chills, fever or night sweats Eyes: blurry vision or double vision ENT: epistaxis or sore throat Allergy/Immunology: itchy/watery eyes or nasal congestion Hematologic/Lymphatic: bleeding problems, blood clots or swollen lymph nodes Endocrine: temperature intolerance or unexpected weight changes Breast: new or changing breast lumps or nipple discharge Resp: cough, shortness of breath, or wheezing CV: chest pain or dyspnea on exertion GI: as per HPI GU: dysuria, trouble voiding, or hematuria MSK: joint pain or joint stiffness Neuro: TIA or stroke symptoms Derm: pruritus and skin lesion changes Psych: anxiety and depression  PE Blood pressure (!) 138/96, pulse 86, temperature 97.6 F (36.4 C), temperature source Oral, resp. rate 15, height '5\' 3"'$  (1.6 m), weight 68 kg, SpO2 98 %. Constitutional: NAD; conversant Eyes: Moist conjunctiva Lungs: Normal respiratory effort CV: RRR GI: Abd soft, NT/ND MSK: Normal range of motion of extremities Psychiatric: Appropriate affect  No results found for this or any previous visit (from the past 48 hour(s)).  No results found.  A/P: Jenny Glass is an 71 y.o. female with hx of HTN, HLD, C diff, PMR (on 2 mg/day of prednisone) here for evaluation of recent complicated diverticulitis  -Cscope completed 09/17/22, as above.  -The anatomy and physiology of  the GI tract was reviewed with the patient. The pathophysiology of complicated diverticulitis was discussed as well with associated pictures.  -We have discussed various different treatment options going forward including surgery (the most definitive) to address this -robotic assisted sigmoidectomy/low anterior resection, flexible sigmoidoscopy, cystoscopy/ureteral ICG by [facility] concurrently  -The planned procedure, material risks (including, but not limited to, pain, bleeding, infection, scarring, need for blood transfusion, damage to surrounding structures- blood vessels/nerves/viscus/organs, damage to ureter, urine leak, leak from anastomosis, need for additional procedures, scenarios where a stoma may be necessary and where it may be permanent, worsening of pre-existing medical conditions, hernia, recurrence, pneumonia, heart attack, stroke, death) benefits and alternatives to surgery were discussed at length. The patient's questions were answered to her satisfaction, she voiced understanding and elected to proceed with surgery. Additionally, we discussed typical postoperative expectations and the recovery process.  Nadeen Landau, [facility], [place]

## 2022-10-12 NOTE — Consult Note (Signed)
[place] Nurse ostomy consult note Patient consult received for new colostomy created intraoperatively on 10/12/22.  First [place] Nursing visit planned for MON, 10/15/22.  [place] nursing team will follow for ostomy management and patient education, and will remain available to this patient, the nursing and medical teams.   Thank you for inviting [place] to participate in this patient's Plan of Care.  Maudie Flakes, MSN, RN, CNS, [place], Serita Grammes, [employer], [employer] phone:  [phone]

## 2022-10-12 NOTE — Anesthesia Preprocedure Evaluation (Signed)
Anesthesia Evaluation  Patient identified by MRN, date of birth, ID band Patient awake    Reviewed: Allergy & Precautions, H&P , NPO status , Patient's Chart, lab work & pertinent test results  Airway Mallampati: II   Neck ROM: full    Dental   Pulmonary neg pulmonary ROS   breath sounds clear to auscultation       Cardiovascular hypertension,  Rhythm:regular Rate:Normal     Neuro/Psych    GI/Hepatic diverticulitis   Endo/Other    Renal/GU      Musculoskeletal  (+) Arthritis ,    Abdominal   Peds  Hematology   Anesthesia Other Findings   Reproductive/Obstetrics                             Anesthesia Physical Anesthesia Plan  ASA: 3  Anesthesia Plan: General   Post-op Pain Management:    Induction: Intravenous  PONV Risk Score and Plan: 3 and Ondansetron, Dexamethasone, Midazolam and Treatment may vary due to age or medical condition  Airway Management Planned: Oral ETT  Additional Equipment:   Intra-op Plan:   Post-operative Plan: Extubation in OR  Informed Consent: I have reviewed the patients History and Physical, chart, labs and discussed the procedure including the risks, benefits and alternatives for the proposed anesthesia with the patient or authorized representative who has indicated his/her understanding and acceptance.     Dental advisory given  Plan Discussed with: CRNA, Anesthesiologist and Surgeon  Anesthesia Plan Comments:        Anesthesia Quick Evaluation

## 2022-10-13 LAB — CBC
HCT: 32 % — ABNORMAL LOW (ref 36.0–46.0)
Hemoglobin: 10 g/dL — ABNORMAL LOW (ref 12.0–15.0)
MCH: 28.2 pg (ref 26.0–34.0)
MCHC: 31.3 g/dL (ref 30.0–36.0)
MCV: 90.4 fL (ref 80.0–100.0)
Platelets: 248 10*3/uL (ref 150–400)
RBC: 3.54 MIL/uL — ABNORMAL LOW (ref 3.87–5.11)
RDW: 14.5 % (ref 11.5–15.5)
WBC: 13.6 10*3/uL — ABNORMAL HIGH (ref 4.0–10.5)
nRBC: 0 % (ref 0.0–0.2)

## 2022-10-13 LAB — BASIC METABOLIC PANEL
Anion gap: 7 (ref 5–15)
BUN: 10 mg/dL (ref 8–23)
CO2: 24 mmol/L (ref 22–32)
Calcium: 8.5 mg/dL — ABNORMAL LOW (ref 8.9–10.3)
Chloride: 105 mmol/L (ref 98–111)
Creatinine, Ser: 0.96 mg/dL (ref 0.44–1.00)
GFR, Estimated: >60 mL/min (ref >60)
Glucose, Bld: 166 mg/dL — ABNORMAL HIGH (ref 70–99)
Potassium: 4.1 mmol/L (ref 3.5–5.1)
Sodium: 136 mmol/L (ref 135–145)

## 2022-10-13 MED ORDER — SODIUM CHLORIDE 0.9 % IV SOLN: 250.0000 mL | INTRAVENOUS | Status: DC | PRN

## 2022-10-13 MED ORDER — HYDROMORPHONE HCL 1 MG/ML IJ SOLN: 0.5000 mg | INTRAMUSCULAR | Status: DC | PRN

## 2022-10-13 MED ORDER — MAGIC MOUTHWASH: 15.0000 mL | Freq: Four times a day (QID) | ORAL | Status: DC | PRN

## 2022-10-13 MED ORDER — LIP MEDEX EX OINT
TOPICAL_OINTMENT | Freq: Two times a day (BID) | CUTANEOUS | Status: DC
  Administered 2022-10-14: 1 via TOPICAL
  Filled 2022-10-13: qty 7

## 2022-10-13 MED ORDER — LORATADINE 10 MG PO TABS
10.0000 mg | ORAL_TABLET | Freq: Every day | ORAL | Status: DC
  Administered 2022-10-13 – 2022-10-17 (×5): 10 mg via ORAL
  Filled 2022-10-13 (×5): qty 1

## 2022-10-13 MED ORDER — METHOCARBAMOL 1000 MG/10ML IJ SOLN: 1000.0000 mg | Freq: Four times a day (QID) | INTRAVENOUS | Status: DC | PRN

## 2022-10-13 MED ORDER — SODIUM CHLORIDE 0.9 % IV SOLN: 8.0000 mg | Freq: Four times a day (QID) | INTRAVENOUS | Status: DC | PRN

## 2022-10-13 MED ORDER — TRAMADOL HCL 50 MG PO TABS
50.0000 mg | ORAL_TABLET | Freq: Four times a day (QID) | ORAL | Status: DC | PRN
  Administered 2022-10-13 – 2022-10-16 (×6): 100 mg via ORAL
  Filled 2022-10-13 (×6): qty 2

## 2022-10-13 MED ORDER — METHOCARBAMOL 500 MG PO TABS
1000.0000 mg | ORAL_TABLET | Freq: Four times a day (QID) | ORAL | Status: DC | PRN
  Administered 2022-10-15 – 2022-10-17 (×2): 1000 mg via ORAL
  Filled 2022-10-13 (×2): qty 2

## 2022-10-13 MED ORDER — CALCIUM POLYCARBOPHIL 625 MG PO TABS
625.0000 mg | ORAL_TABLET | Freq: Two times a day (BID) | ORAL | Status: DC
  Administered 2022-10-13 – 2022-10-17 (×9): 625 mg via ORAL
  Filled 2022-10-13 (×9): qty 1

## 2022-10-13 MED ORDER — SODIUM CHLORIDE 0.9% FLUSH
3.0000 mL | Freq: Two times a day (BID) | INTRAVENOUS | Status: DC
  Administered 2022-10-13 – 2022-10-16 (×7): 3 mL via INTRAVENOUS

## 2022-10-13 MED ORDER — SODIUM CHLORIDE 0.9% FLUSH: 3.0000 mL | INTRAVENOUS | Status: DC | PRN

## 2022-10-13 MED ORDER — ONDANSETRON HCL 4 MG/2ML IJ SOLN: 4.0000 mg | Freq: Four times a day (QID) | INTRAMUSCULAR | Status: DC | PRN

## 2022-10-13 MED ORDER — SALINE SPRAY 0.65 % NA SOLN: 1.0000 | Freq: Four times a day (QID) | NASAL | Status: DC | PRN

## 2022-10-13 MED ORDER — LACTATED RINGERS IV BOLUS: 1000.0000 mL | Freq: Three times a day (TID) | INTRAVENOUS | Status: AC | PRN

## 2022-10-13 MED ORDER — NAPHAZOLINE-GLYCERIN 0.012-0.25 % OP SOLN: 1.0000 [drp] | Freq: Four times a day (QID) | OPHTHALMIC | Status: DC | PRN

## 2022-10-13 MED ORDER — PHENOL 1.4 % MT LIQD: 2.0000 | OROMUCOSAL | Status: DC | PRN

## 2022-10-13 MED ORDER — MENTHOL 3 MG MT LOZG: 1.0000 | LOZENGE | OROMUCOSAL | Status: DC | PRN

## 2022-10-13 NOTE — Progress Notes (Signed)
Mobility Specialist - Progress Note   10/13/22 1238  Mobility  Activity Ambulated with assistance in hallway  Level of Assistance Standby assist, set-up cues, supervision of patient - no hands on  Assistive Device Front wheel walker  Distance Ambulated (ft) 120 ft  Activity Response Tolerated well  Mobility Referral Yes  $Mobility charge 1 Mobility   Pt received in recliner and agreeable to mobility. No complaints during mobility. Pt to bed after session with all needs met w/ nurse & husband in room.    [facility]

## 2022-10-13 NOTE — Progress Notes (Signed)
Attempted to get patient up to walking but she was unable to get up due to the pain. PRN Dilaudid was given. Will try again later.

## 2022-10-13 NOTE — Progress Notes (Signed)
Jenny Glass [id number] Dec 07, 1950  CARE TEAM:  PCP: Merrilee Seashore, MD  Outpatient Care Team: Patient Care Team: Merrilee Seashore, MD as PCP - General (Internal Medicine)  Inpatient Treatment Team: Treatment Team: Attending Provider: Ileana Roup, MD; Why Nurse: Nadara Mode, RN; Technician: Constance Goltz, NT; Physical Therapist: Lucile Crater, PT; Utilization Review: Alanson Puls, RN; Registered Nurse: Tanda Rockers, RN   Problem List:   Principal Problem:   S/P laparoscopic-assisted sigmoidectomy   1 Day Post-Op  10/12/2022 POSTOP DIAGNOSIS: DIVERTICULITIS   PROCEDURE:  Robotic assisted low anterior resection with end colostomy Robotic assisted left salpingo-oophorectomy Drainage of intra-abdominal abscess 4 x 4 cm Bilateral transversus abdominus plane (TAP) blocks   SURGEON: Sharon Mt. White, MD  FINDINGS: Significant inflammatory adhesions about her sigmoid colon related to her likely smoldering diverticulitis.  Intra-abdominal abscess was also encountered and drained containing approximately 15 cc of milky white pus.  This was in the vicinity of her pelvic sigmoid.  Densely involving her left fallopian tube and ovary which were unable to be separated and therefore were removed en bloc.  Inflammation went all way down to her rectum down to her pelvic floor/peritoneal reflection and posterior uterus.  Given all the inflammatory type changes as well as the rectal involvement all way down to her mid rectum, it was felt that Hartman's type resection would be necessary as opposed to any sort of primary anastomosis with/without diverting ileostomy.  Proximal sigmoid colostomy fashion in the left lower quadrant and her previously marked ostomy marking site.   Assessment  Stable  [facility] Stay = 1 days)  Plan:  ERAS pathway Follow-up on pathology Try dysphagia 1/full liquids as tolerated.  Ginnie alvimopan Continue baseline  prednisone. Colostomy care and training and teaching. -VTE prophylaxis- SCDs, etc -mobilize as tolerated to help recovery  Disposition:  Disposition:  The patient is from: Home  Anticipate discharge to:  Home with Home Health  Anticipated Date of Discharge is:  December 12,2023    Barriers to discharge:  Therapy assessment & Recommendations pending and Pending Clinical improvement (more likely than not)  Patient currently is NOT MEDICALLY STABLE for discharge from the hospital from a surgery standpoint.      I reviewed nursing notes, last 24 h vitals and pain scores, last 48 h intake and output, last 24 h labs and trends, and last 24 h imaging results. I have reviewed this patient's available data, including medical history, events of note, test results, etc as part of my evaluation.  A significant portion of that time was spent in counseling.  Care during the described time interval was provided by me.  This care required moderate level of medical decision making.  10/13/2022    Subjective: (Chief complaint)  Patient with some soreness last night.  Feeling better.  Walked in hallways x 1 and to the bathroom x 1.  Tolerating some liquids.  No nausea or vomiting.  Objective:  Vital signs:  Vitals:   10/12/22 1946 10/12/22 2137 10/13/22 0049 10/13/22 0420  BP: 139/85 128/81 106/77 123/83  Pulse: 74 74 79 79  Resp: '16 16 16 18  '$ Temp: (!) 97.4 F (36.3 C) 97.8 F (36.6 C) 98.5 F (36.9 C) 98.5 F (36.9 C)  TempSrc: Oral Oral Oral Oral  SpO2: 100% 100% 95% 96%  Weight:      Height:        Last BM Date : 10/12/22  Intake/Output   Yesterday:  12/08  0701 - 12/09 0700 In: 3571.3 [P.O.:180; I.V.:2791.3; IV Piggyback:600] Out: 1800 [Urine:1550; Blood:250] This shift:  No intake/output data recorded.  Bowel function:  Flatus: No  BM:  No  Drain: (No drain)   Physical Exam:  General: Pt awake/alert in no acute distress Eyes: PERRL, normal EOM.  Sclera  clear.  No icterus Neuro: CN II-XII intact w/o focal sensory/motor deficits. Lymph: No head/neck/groin lymphadenopathy Psych:  No delerium/psychosis/paranoia.  Oriented x 4 HENT: Normocephalic, Mucus membranes moist.  No thrush Neck: Supple, No tracheal deviation.  No obvious thyromegaly Chest: No pain to chest wall compression.  Good respiratory excursion.  No audible wheezing CV:  Pulses intact.  Regular rhythm.  No major extremity edema MS: Normal AROM mjr joints.  No obvious deformity  Abdomen: Soft.  Nondistended.  Mildly tender at incisions only.  Colostomy pink with no gas no flatus no evidence of peritonitis.  No incarcerated hernias.  Ext:   No deformity.  No mjr edema.  No cyanosis Skin: No petechiae / purpurea.  No major sores.  Warm and dry    Results:   Cultures: No results found for this or any previous visit (from the past 720 hour(s)).  Labs: Results for orders placed or performed during the hospital encounter of 10/12/22 (from the past 48 hour(s))  ABO/Rh     Status: None   Collection Time: 10/12/22 10:26 AM  Result Value Ref Range   ABO/RH(D)      O POS Performed at [facility], [place] [address] [place], [place] [address]   CBC with Differential     Status: Abnormal   Collection Time: 10/12/22  4:30 PM  Result Value Ref Range   WBC 11.6 (H) 4.0 - 10.5 K/uL   RBC 3.63 (L) 3.87 - 5.11 MIL/uL   Hemoglobin 10.3 (L) 12.0 - 15.0 g/dL   HCT 33.7 (L) 36.0 - 46.0 %   MCV 92.8 80.0 - 100.0 fL   MCH 28.4 26.0 - 34.0 pg   MCHC 30.6 30.0 - 36.0 g/dL   RDW 14.6 11.5 - 15.5 %   Platelets 240 150 - 400 K/uL   nRBC 0.0 0.0 - 0.2 %   Neutrophils Relative % 93 %   Neutro Abs 10.6 (H) 1.7 - 7.7 K/uL   Lymphocytes Relative 5 %   Lymphs Abs 0.6 (L) 0.7 - 4.0 K/uL   Monocytes Relative 2 %   Monocytes Absolute 0.3 0.1 - 1.0 K/uL   Eosinophils Relative 0 %   Eosinophils Absolute 0.0 0.0 - 0.5 K/uL   Basophils Relative 0 %   Basophils Absolute 0.0 0.0 -  0.1 K/uL   Immature Granulocytes 0 %   Abs Immature Granulocytes 0.04 0.00 - 0.07 K/uL    Comment: Performed at [facility], [place] [address], [place], [place] [address]  Comprehensive metabolic panel     Status: Abnormal   Collection Time: 10/12/22  4:30 PM  Result Value Ref Range   Sodium 137 135 - 145 mmol/L   Potassium 3.4 (L) 3.5 - 5.1 mmol/L   Chloride 105 98 - 111 mmol/L   CO2 25 22 - 32 mmol/L   Glucose, Bld 178 (H) 70 - 99 mg/dL    Comment: Glucose reference range applies only to samples taken after fasting for at least 8 hours.   BUN 11 8 - 23 mg/dL   Creatinine, Ser 1.16 (H) 0.44 - 1.00 mg/dL   Calcium 8.0 (L) 8.9 - 10.3 mg/dL  Total Protein 6.3 (L) 6.5 - 8.1 g/dL   Albumin 3.9 3.5 - 5.0 g/dL   AST 13 (L) 15 - 41 U/L   ALT 10 0 - 44 U/L   Alkaline Phosphatase 46 38 - 126 U/L   Total Bilirubin 0.4 0.3 - 1.2 mg/dL   GFR, Estimated 51 (L) >60 mL/min    Comment: (NOTE) Calculated using the CKD-EPI Creatinine Equation (2021)    Anion gap 7 5 - 15    Comment: Performed at [facility], [place] [address] [place], [place] [address]  CBC     Status: Abnormal   Collection Time: 10/13/22  4:58 AM  Result Value Ref Range   WBC 13.6 (H) 4.0 - 10.5 K/uL   RBC 3.54 (L) 3.87 - 5.11 MIL/uL   Hemoglobin 10.0 (L) 12.0 - 15.0 g/dL   HCT 32.0 (L) 36.0 - 46.0 %   MCV 90.4 80.0 - 100.0 fL   MCH 28.2 26.0 - 34.0 pg   MCHC 31.3 30.0 - 36.0 g/dL   RDW 14.5 11.5 - 15.5 %   Platelets 248 150 - 400 K/uL   nRBC 0.0 0.0 - 0.2 %    Comment: Performed at [facility], [place] [address], [place], [place] [address]  Basic metabolic panel     Status: Abnormal   Collection Time: 10/13/22  4:58 AM  Result Value Ref Range   Sodium 136 135 - 145 mmol/L   Potassium 4.1 3.5 - 5.1 mmol/L   Chloride 105 98 - 111 mmol/L   CO2 24 22 - 32 mmol/L   Glucose, Bld 166 (H) 70 - 99 mg/dL    Comment: Glucose reference range applies only to samples taken  after fasting for at least 8 hours.   BUN 10 8 - 23 mg/dL   Creatinine, Ser 0.96 0.44 - 1.00 mg/dL   Calcium 8.5 (L) 8.9 - 10.3 mg/dL   GFR, Estimated >60 >60 mL/min    Comment: (NOTE) Calculated using the CKD-EPI Creatinine Equation (2021)    Anion gap 7 5 - 15    Comment: Performed at [facility], [place] [address] [place], [place] [address]    Imaging / Studies: No results found.  Medications / Allergies: per chart  Antibiotics: Anti-infectives (From admission, onward)    Start     Dose/Rate Route Frequency Ordered Stop   10/12/22 1400  neomycin (MYCIFRADIN) tablet 1,000 mg  Status:  Discontinued       See Hyperspace for full Linked Orders Report.   1,000 mg Oral 3 times per day 10/12/22 0939 10/12/22 0943   10/12/22 1400  metroNIDAZOLE (FLAGYL) tablet 1,000 mg  Status:  Discontinued       See Hyperspace for full Linked Orders Report.   1,000 mg Oral 3 times per day 10/12/22 0939 10/12/22 0943   10/12/22 0945  cefoTEtan (CEFOTAN) 2 g in sodium chloride 0.9 % 100 mL IVPB        2 g 200 mL/hr over 30 Minutes Intravenous On call to O.R. 10/12/22 0939 10/12/22 1202         Note: Portions of this report may have been transcribed using voice recognition software. Every effort was made to ensure accuracy; however, inadvertent computerized transcription errors may be present.   Any transcriptional errors that result from this process are unintentional.    Adin Hector, MD, FACS, MASCRS Esophageal, Gastrointestinal & Colorectal Surgery Robotic and Minimally Invasive Surgery  [facility]  [place] [address] [place], [place] [address] [phone] Fax [phone] Main  CONTACT INFORMATION:  Weekday (9AM-5PM): Call [facility] main office at [phone]  Weeknight (5PM-9AM) or Weekend/Holiday: Check [email or web address] (password " [email or web address]") for General Surgery [facility] coverage  (Please, do not use  SecureChat as it is not reliable communication to reach operating surgeons for immediate patient care given surgeries/outpatient duties/clinic/cross-coverage/off post-call which would lead to a delay in care.  Epic staff messaging available for outptient concerns, but may not be answered for 48 hours or more).     10/13/2022  8:00 AM

## 2022-10-13 NOTE — Evaluation (Signed)
Physical Therapy Evaluation Patient Details Name: Jenny Glass MRN: [id number] DOB: [birth year] Today's Date: 10/13/2022  History of Present Illness  71 y.o. female admitted 10/12/22 with diverticulitis,  s/p colectomy with end colostomy, drainage of abscess, salpingo-oophorectomy. PMH: OA, LBP, HTN, RA, L RCR 2017.  Clinical Impression  Pt admitted with above diagnosis. Instructed pt in log roll technique for getting out of bed. Pt ambulated 12' x 2 with RW, distance limited by pain and fatigue, pt was tremulous at end of second bout of ambulation, she reported 10/10 pain. Good progress expected.  Pt currently with functional limitations due to the deficits listed below (see PT Problem List). Pt will benefit from skilled PT to increase their independence and safety with mobility to allow discharge to the venue listed below.          Recommendations for follow up therapy are one component of a multi-disciplinary discharge planning process, led by the attending physician.  Recommendations may be updated based on patient status, additional functional criteria and insurance authorization.  Follow Up Recommendations No PT follow up      Assistance Recommended at Discharge Intermittent Supervision/Assistance  Patient can return home with the following  A little help with bathing/dressing/bathroom;Assist for transportation;Assistance with cooking/housework;Help with stairs or ramp for entrance    Equipment Recommendations Rolling walker (2 wheels)  Recommendations for Other Services       Functional Status Assessment Patient has had a recent decline in their functional status and demonstrates the ability to make significant improvements in function in a reasonable and predictable amount of time.     Precautions / Restrictions Precautions Precautions: Other (comment) Precaution Comments: abdominal surgery Restrictions Weight Bearing Restrictions: No      Mobility  Bed Mobility Overal  bed mobility: Needs Assistance Bed Mobility: Rolling, Sidelying to Sit Rolling: Min assist Sidelying to sit: Min assist       General bed mobility comments: VCs for technique for log roll    Transfers Overall transfer level: Needs assistance Equipment used: Rolling walker (2 wheels) Transfers: Sit to/from Stand Sit to Stand: Min guard           General transfer comment: VCs hand placement    Ambulation/Gait Ambulation/Gait assistance: Min guard Gait Distance (Feet): 24 Feet Assistive device: Rolling walker (2 wheels) Gait Pattern/deviations: Step-through pattern, Decreased stride length Gait velocity: decr     General Gait Details: pt ambulated bed to toilet to recliner, 12'x 2 with RW, distance limited by pain and fatigue (pt was tremulous)  Stairs            Wheelchair Mobility    Modified Rankin (Stroke Patients Only)       Balance Overall balance assessment: Modified Independent                                           Pertinent Vitals/Pain Pain Assessment Pain Assessment: 0-10 Pain Score: 10-Worst pain ever Pain Location: abdomen Pain Descriptors / Indicators: Sore Pain Intervention(s): Limited activity within patient's tolerance, Monitored during session, Patient requesting pain meds-RN notified, Premedicated before session    Home Living Family/patient expects to be discharged to:: Private residence Living Arrangements: Spouse/significant other Available Help at Discharge: Family;Available 24 hours/day Type of Home: House Home Access: Stairs to enter Entrance Stairs-Rails: None Entrance Stairs-Number of Steps: 2   Home Layout: One level Home Equipment: Grab bars -  tub/shower      Prior Function Prior Level of Function : Independent/Modified Independent             Mobility Comments: walked without AD, no falls in past 6 months       Hand Dominance        Extremity/Trunk Assessment   Upper Extremity  Assessment Upper Extremity Assessment: Overall WFL for tasks assessed    Lower Extremity Assessment Lower Extremity Assessment: Overall WFL for tasks assessed    Cervical / Trunk Assessment Cervical / Trunk Assessment: Normal  Communication   Communication: No difficulties  Cognition Arousal/Alertness: Awake/alert Behavior During Therapy: WFL for tasks assessed/performed Overall Cognitive Status: Within Functional Limits for tasks assessed                                          General Comments      Exercises     Assessment/Plan    PT Assessment Patient needs continued PT services  PT Problem List Decreased activity tolerance;Decreased mobility;Pain       PT Treatment Interventions Gait training;Therapeutic exercise;Patient/family education    PT Goals (Current goals can be found in the Care Plan section)  Acute Rehab PT Goals Patient Stated Goal: to walk farther PT Goal Formulation: With patient/family Time For Goal Achievement: 10/27/22 Potential to Achieve Goals: Good    Frequency Min 3X/week     Co-evaluation               AM-PAC PT "6 Clicks" Mobility  Outcome Measure Help needed turning from your back to your side while in a flat bed without using bedrails?: A Little Help needed moving from lying on your back to sitting on the side of a flat bed without using bedrails?: A Little Help needed moving to and from a bed to a chair (including a wheelchair)?: A Little Help needed standing up from a chair using your arms (e.g., wheelchair or bedside chair)?: A Little Help needed to walk in hospital room?: A Little Help needed climbing 3-5 steps with a railing? : A Lot 6 Click Score: 17    End of Session   Activity Tolerance: Patient limited by fatigue;Patient limited by pain Patient left: in chair;with call bell/phone within reach;with family/visitor present;with nursing/sitter in room Nurse Communication: Mobility status PT Visit  Diagnosis: Difficulty in walking, not elsewhere classified (R26.2);Pain    Time: 1044-1100 PT Time Calculation (min) (ACUTE ONLY): 16 min   Charges:   PT Evaluation $PT Eval Moderate Complexity: 1 Mod          Philomena Doheny PT 10/13/2022  Acute Rehabilitation Services  Office [phone]

## 2022-10-14 LAB — POTASSIUM: Potassium: 4 mmol/L (ref 3.5–5.1)

## 2022-10-14 LAB — CBC
HCT: 27.2 % — ABNORMAL LOW (ref 36.0–46.0)
Hemoglobin: 8.4 g/dL — ABNORMAL LOW (ref 12.0–15.0)
MCH: 28.3 pg (ref 26.0–34.0)
MCHC: 30.9 g/dL (ref 30.0–36.0)
MCV: 91.6 fL (ref 80.0–100.0)
Platelets: 209 10*3/uL (ref 150–400)
RBC: 2.97 MIL/uL — ABNORMAL LOW (ref 3.87–5.11)
RDW: 14.7 % (ref 11.5–15.5)
WBC: 12.7 10*3/uL — ABNORMAL HIGH (ref 4.0–10.5)
nRBC: 0 % (ref 0.0–0.2)

## 2022-10-14 LAB — CREATININE, SERUM
Creatinine, Ser: 0.84 mg/dL (ref 0.44–1.00)
GFR, Estimated: >60 mL/min (ref >60)

## 2022-10-14 MED ORDER — FERROUS SULFATE 325 (65 FE) MG PO TABS
325.0000 mg | ORAL_TABLET | Freq: Two times a day (BID) | ORAL | Status: DC
  Administered 2022-10-14 – 2022-10-17 (×7): 325 mg via ORAL
  Filled 2022-10-14 (×7): qty 1

## 2022-10-14 MED ORDER — VITAMIN C 500 MG PO TABS
500.0000 mg | ORAL_TABLET | Freq: Two times a day (BID) | ORAL | Status: DC
  Administered 2022-10-14 – 2022-10-17 (×7): 500 mg via ORAL
  Filled 2022-10-14 (×7): qty 1

## 2022-10-14 NOTE — Anesthesia Postprocedure Evaluation (Signed)
Anesthesia Post Note  Patient: Jenny Glass  Procedure(s) Performed: ROBOTIC SIGMOIDECTOMY WITH LOW ANTERIOR RESECTION, OSTOMY CREATION CYSTOSCOPY with FIREFLY INJECTION     Patient location during evaluation: PACU Anesthesia Type: General Level of consciousness: awake and alert Pain management: pain level controlled Vital Signs Assessment: post-procedure vital signs reviewed and stable Respiratory status: spontaneous breathing, nonlabored ventilation, respiratory function stable and patient connected to nasal cannula oxygen Cardiovascular status: blood pressure returned to baseline and stable Postop Assessment: no apparent nausea or vomiting Anesthetic complications: no   No notable events documented.  Last Vitals:  Vitals:   10/13/22 2105 10/14/22 0448  BP: 119/72 113/70  Pulse: 78 71  Resp: 16   Temp: 36.7 C 36.6 C  SpO2: 93% 93%    Last Pain:  Vitals:   10/14/22 0940  TempSrc:   PainSc: [place]

## 2022-10-14 NOTE — Progress Notes (Signed)
Mobility Specialist - Progress Note   10/14/22 1604  Mobility  Activity Ambulated with assistance in hallway  Level of Assistance Modified independent, requires aide device or extra time  Assistive Device Front wheel walker  Distance Ambulated (ft) 350 ft  Activity Response Tolerated well  Mobility Referral Yes  $Mobility charge 1 Mobility   Pt received in bed and agreeable to mobility. No complaints during mobility. Pt to bed after session with all needs met.    [facility]

## 2022-10-14 NOTE — Progress Notes (Addendum)
LAKE BREEDING [id number] 31-Mar-1951  CARE TEAM:  PCP: Merrilee Seashore, MD  Outpatient Care Team: Patient Care Team: Merrilee Seashore, MD as PCP - General (Internal Medicine)  Inpatient Treatment Team: Treatment Team: Attending Provider: Ileana Roup, MD; WOC Nurse: Nadara Mode, RN; Mobility Specialist: Trevor Mace; Registered Nurse: Haynes Bast, RN; Technician: Constance Goltz, NT; Occupational Therapist: Willa Rough, OT; Utilization Review: Alanson Puls, RN   Problem List:   Principal Problem:   S/P laparoscopic-assisted sigmoidectomy   2 Days Post-Op  10/12/2022 POSTOP DIAGNOSIS: DIVERTICULITIS   PROCEDURE:  Robotic assisted low anterior resection with end colostomy Robotic assisted left salpingo-oophorectomy Drainage of intra-abdominal abscess 4 x 4 cm Bilateral transversus abdominus plane (TAP) blocks   SURGEON: Sharon Mt. White, MD  FINDINGS: Significant inflammatory adhesions about her sigmoid colon related to her likely smoldering diverticulitis.  Intra-abdominal abscess was also encountered and drained containing approximately 15 cc of milky white pus.  This was in the vicinity of her pelvic sigmoid.  Densely involving her left fallopian tube and ovary which were unable to be separated and therefore were removed en bloc.  Inflammation went all way down to her rectum down to her pelvic floor/peritoneal reflection and posterior uterus.  Given all the inflammatory type changes as well as the rectal involvement all way down to her mid rectum, it was felt that Hartman's type resection would be necessary as opposed to any sort of primary anastomosis with/without diverting ileostomy.  Proximal sigmoid colostomy fashion in the left lower quadrant and her previously marked ostomy marking site.   Assessment  Stable  [facility] Stay = 2 days)  Plan:  ERAS pathway Follow-up on pathology Prior dysphagia 1/look diet.  Advance to solid  diet Continue baseline prednisone. Colostomy care and training and teaching.  Wound ostomy nursing to come tomorrow, Monday Acute blood loss anemia in the setting of anemia chronic disease/iron deficiency anemia.  Seems to be plateauing.  Follow.  Iron/vitamin C. -VTE prophylaxis- SCDs, etc -mobilize as tolerated to help recovery -Hopefully can proceed with second stage colostomy takedown in 3-6 months depending on recovery.  Dr. Dema Severin to discuss with patient.  Disposition:  Disposition:  The patient is from: Home  Anticipate discharge to:  Home with Home Health  Anticipated Date of Discharge is:  December 11,2023    Barriers to discharge:  Therapy assessment & Recommendations pending and Pending Clinical improvement (more likely than not)  Patient currently is NOT MEDICALLY STABLE for discharge from the hospital from a surgery standpoint.      I reviewed nursing notes, last 24 h vitals and pain scores, last 48 h intake and output, last 24 h labs and trends, and last 24 h imaging results. I have reviewed this patient's available data, including medical history, events of note, test results, etc as part of my evaluation.  A significant portion of that time was spent in counseling.  Care during the described time interval was provided by me.  This care required moderate level of medical decision making.  10/14/2022    Subjective: (Chief complaint)  Patient with some turns of itchy eyes and sore throat.  Feeling better after getting Benadryl and later Claritin.  Tolerating full liquids.  Walking around better.  Pain mostly controlled with Tylenol.  Objective:  Vital signs:  Vitals:   10/13/22 1319 10/13/22 2105 10/14/22 0448 10/14/22 0500  BP: 115/65 119/72 113/70   Pulse: 87 78 71   Resp: 16 16  Temp: 98.6 F (37 C) 98.1 F (36.7 C) 97.8 F (36.6 C)   TempSrc: Oral Oral Oral   SpO2: 94% 93% 93%   Weight:    72.3 kg  Height:        Last BM Date :  10/12/22  Intake/Output   Yesterday:  12/09 0701 - 12/10 0700 In: 480 [P.O.:480] Out: 2275 [Urine:2100; Stool:175] This shift:  No intake/output data recorded.  Bowel function:  Flatus: YES  BM:  No  Drain: (No drain)   Physical Exam:  General: Pt awake/alert in no acute distress.  Sitting up smiling.  In no acute distress. Eyes: PERRL, normal EOM.  Sclera clear.  No icterus Neuro: CN II-XII intact w/o focal sensory/motor deficits. Lymph: No head/neck/groin lymphadenopathy Psych:  No delerium/psychosis/paranoia.  Oriented x 4 HENT: Normocephalic, Mucus membranes moist.  No thrush Neck: Supple, No tracheal deviation.  No obvious thyromegaly Chest: No pain to chest wall compression.  Good respiratory excursion.  No audible wheezing CV:  Pulses intact.  Regular rhythm.  No major extremity edema MS: Normal AROM mjr joints.  No obvious deformity  Abdomen: Soft.  Nondistended.  Mildly tender at incisions only.  Colostomy pink with scant flatus / no evidence of peritonitis.  No incarcerated hernias.  Ext:   No deformity.  No mjr edema.  No cyanosis Skin: No petechiae / purpurea.  No major sores.  Warm and dry    Results:   Cultures: No results found for this or any previous visit (from the past 720 hour(s)).  Labs: Results for orders placed or performed during the hospital encounter of 10/12/22 (from the past 48 hour(s))  ABO/Rh     Status: None   Collection Time: 10/12/22 10:26 AM  Result Value Ref Range   ABO/RH(D)      O POS Performed at [facility], [place] [address], [place], [place] [address]   CBC with Differential     Status: Abnormal   Collection Time: 10/12/22  4:30 PM  Result Value Ref Range   WBC 11.6 (H) 4.0 - 10.5 K/uL   RBC 3.63 (L) 3.87 - 5.11 MIL/uL   Hemoglobin 10.3 (L) 12.0 - 15.0 g/dL   HCT 33.7 (L) 36.0 - 46.0 %   MCV 92.8 80.0 - 100.0 fL   MCH 28.4 26.0 - 34.0 pg   MCHC 30.6 30.0 - 36.0 g/dL   RDW 14.6 11.5 - 15.5 %    Platelets 240 150 - 400 K/uL   nRBC 0.0 0.0 - 0.2 %   Neutrophils Relative % 93 %   Neutro Abs 10.6 (H) 1.7 - 7.7 K/uL   Lymphocytes Relative 5 %   Lymphs Abs 0.6 (L) 0.7 - 4.0 K/uL   Monocytes Relative 2 %   Monocytes Absolute 0.3 0.1 - 1.0 K/uL   Eosinophils Relative 0 %   Eosinophils Absolute 0.0 0.0 - 0.5 K/uL   Basophils Relative 0 %   Basophils Absolute 0.0 0.0 - 0.1 K/uL   Immature Granulocytes 0 %   Abs Immature Granulocytes 0.04 0.00 - 0.07 K/uL    Comment: Performed at [facility], [place] [address] [place], [place] [address]  Comprehensive metabolic panel     Status: Abnormal   Collection Time: 10/12/22  4:30 PM  Result Value Ref Range   Sodium 137 135 - 145 mmol/L   Potassium 3.4 (L) 3.5 - 5.1 mmol/L   Chloride 105 98 - 111 mmol/L   CO2 25 22 -  32 mmol/L   Glucose, Bld 178 (H) 70 - 99 mg/dL    Comment: Glucose reference range applies only to samples taken after fasting for at least 8 hours.   BUN 11 8 - 23 mg/dL   Creatinine, Ser 1.16 (H) 0.44 - 1.00 mg/dL   Calcium 8.0 (L) 8.9 - 10.3 mg/dL   Total Protein 6.3 (L) 6.5 - 8.1 g/dL   Albumin 3.9 3.5 - 5.0 g/dL   AST 13 (L) 15 - 41 U/L   ALT 10 0 - 44 U/L   Alkaline Phosphatase 46 38 - 126 U/L   Total Bilirubin 0.4 0.3 - 1.2 mg/dL   GFR, Estimated 51 (L) >60 mL/min    Comment: (NOTE) Calculated using the CKD-EPI Creatinine Equation (2021)    Anion gap 7 5 - 15    Comment: Performed at [facility], [place] [address] [place], [place] [address]  CBC     Status: Abnormal   Collection Time: 10/13/22  4:58 AM  Result Value Ref Range   WBC 13.6 (H) 4.0 - 10.5 K/uL   RBC 3.54 (L) 3.87 - 5.11 MIL/uL   Hemoglobin 10.0 (L) 12.0 - 15.0 g/dL   HCT 32.0 (L) 36.0 - 46.0 %   MCV 90.4 80.0 - 100.0 fL   MCH 28.2 26.0 - 34.0 pg   MCHC 31.3 30.0 - 36.0 g/dL   RDW 14.5 11.5 - 15.5 %   Platelets 248 150 - 400 K/uL   nRBC 0.0 0.0 - 0.2 %    Comment: Performed at [facility],  [place] [address], [place], [place] [address]  Basic metabolic panel     Status: Abnormal   Collection Time: 10/13/22  4:58 AM  Result Value Ref Range   Sodium 136 135 - 145 mmol/L   Potassium 4.1 3.5 - 5.1 mmol/L   Chloride 105 98 - 111 mmol/L   CO2 24 22 - 32 mmol/L   Glucose, Bld 166 (H) 70 - 99 mg/dL    Comment: Glucose reference range applies only to samples taken after fasting for at least 8 hours.   BUN 10 8 - 23 mg/dL   Creatinine, Ser 0.96 0.44 - 1.00 mg/dL   Calcium 8.5 (L) 8.9 - 10.3 mg/dL   GFR, Estimated >60 >60 mL/min    Comment: (NOTE) Calculated using the CKD-EPI Creatinine Equation (2021)    Anion gap 7 5 - 15    Comment: Performed at [facility], [place] [address], [place], [place] [address]  CBC     Status: Abnormal   Collection Time: 10/14/22  4:29 AM  Result Value Ref Range   WBC 12.7 (H) 4.0 - 10.5 K/uL   RBC 2.97 (L) 3.87 - 5.11 MIL/uL   Hemoglobin 8.4 (L) 12.0 - 15.0 g/dL   HCT 27.2 (L) 36.0 - 46.0 %   MCV 91.6 80.0 - 100.0 fL   MCH 28.3 26.0 - 34.0 pg   MCHC 30.9 30.0 - 36.0 g/dL   RDW 14.7 11.5 - 15.5 %   Platelets 209 150 - 400 K/uL   nRBC 0.0 0.0 - 0.2 %    Comment: Performed at [facility], [place] [address] [place], [place] [address]  Potassium     Status: None   Collection Time: 10/14/22  4:29 AM  Result Value Ref Range   Potassium 4.0 3.5 - 5.1 mmol/L    Comment: Performed at [facility], [place] [address]  [place], [place] [address]  Creatinine, serum     Status: None   Collection Time: 10/14/22  4:29 AM  Result Value Ref Range   Creatinine, Ser 0.84 0.44 - 1.00 mg/dL   GFR, Estimated >60 >60 mL/min    Comment: (NOTE) Calculated using the CKD-EPI Creatinine Equation (2021) Performed at [facility], [place] [address], [place], [place] [address]     Imaging / Studies: No results found.  Medications / Allergies: per chart  Antibiotics: Anti-infectives (From  admission, onward)    Start     Dose/Rate Route Frequency Ordered Stop   10/12/22 1400  neomycin (MYCIFRADIN) tablet 1,000 mg  Status:  Discontinued       See Hyperspace for full Linked Orders Report.   1,000 mg Oral 3 times per day 10/12/22 0939 10/12/22 0943   10/12/22 1400  metroNIDAZOLE (FLAGYL) tablet 1,000 mg  Status:  Discontinued       See Hyperspace for full Linked Orders Report.   1,000 mg Oral 3 times per day 10/12/22 0939 10/12/22 0943   10/12/22 0945  cefoTEtan (CEFOTAN) 2 g in sodium chloride 0.9 % 100 mL IVPB        2 g 200 mL/hr over 30 Minutes Intravenous On call to O.R. 10/12/22 0939 10/12/22 1202         Note: Portions of this report may have been transcribed using voice recognition software. Every effort was made to ensure accuracy; however, inadvertent computerized transcription errors may be present.   Any transcriptional errors that result from this process are unintentional.    Adin Hector, MD, FACS, MASCRS Esophageal, Gastrointestinal & Colorectal Surgery Robotic and Minimally Invasive Surgery  [place]. [address], [place], [place] [address] [phone] Fax [phone] Main  CONTACT INFORMATION:  Weekday (9AM-5PM): Call [facility] main office at [phone]  Weeknight (5PM-9AM) or Weekend/Holiday: Check [email or web address] (password " [email or web address]") for General Surgery [facility] coverage  (Please, do not use SecureChat as it is not reliable communication to reach operating surgeons for immediate patient care given surgeries/outpatient duties/clinic/cross-coverage/off post-call which would lead to a delay in care.  Epic staff messaging available for outptient concerns, but may not be answered for 48 hours or more).     10/14/2022  7:16 AM

## 2022-10-14 NOTE — Evaluation (Signed)
Occupational Therapy Evaluation Patient Details Name: Jenny Glass MRN: [id number] DOB: [birth year] Today's Date: 10/14/2022   History of Present Illness Patient is a 71 year old female admitted 10/12/22 with diverticulitis,  s/p colectomy with end colostomy, drainage of abscess, salpingo-oophorectomy. PMH: OA, LBP, HTN, RA, L RCR 2017.   Clinical Impression   Patient evaluated by Occupational Therapy with no further acute OT needs identified. All education has been completed and the patient has no further questions. Patient is MI for ADL tasks at this time with patient able to carryover education from PT session previous day. Patient endorses being at baseline. See below for any follow-up Occupational Therapy or equipment needs. OT is signing off. Thank you for this referral.       Recommendations for follow up therapy are one component of a multi-disciplinary discharge planning process, led by the attending physician.  Recommendations may be updated based on patient status, additional functional criteria and insurance authorization.   Follow Up Recommendations  No OT follow up     Assistance Recommended at Discharge Intermittent Supervision/Assistance  Patient can return home with the following Assistance with cooking/housework    Functional Status Assessment  Patient has not had a recent decline in their functional status  Equipment Recommendations  Other (comment) (reacher)    Recommendations for Other Services       Precautions / Restrictions Precautions Precautions: Other (comment) Precaution Comments: abdominal surgery Restrictions Weight Bearing Restrictions: No      Mobility Bed Mobility Overal bed mobility: Modified Independent             General bed mobility comments: patient was able to verbalize proper log roll technique and demonstrate with 100% accuracy    Transfers                          Balance Overall balance assessment: Modified  Independent                                         ADL either performed or assessed with clinical judgement   ADL Overall ADL's : Modified independent                                       General ADL Comments: patient is able to complete ADL tasks at RW level with MI. patient was educated on importance of using figure four positioning of BLE to complete LB dressing with patient able to demonstrate understanding. patient was educated Forensic psychologist use and benefits to avoid pressure on abdominal incisions. patient verbalized understanding and reported she would look into purchasing one at d/c. patient endorsed being at baseline for ADLs at this time,     Vision Patient Visual Report: No change from baseline       Perception     Praxis      Pertinent Vitals/Pain Pain Assessment Pain Assessment: Faces Faces Pain Scale: Hurts a little bit Pain Location: abdomen Pain Descriptors / Indicators: Sore Pain Intervention(s): Limited activity within patient's tolerance, Monitored during session     Hand Dominance Left   Extremity/Trunk Assessment Upper Extremity Assessment Upper Extremity Assessment: Overall WFL for tasks assessed   Lower Extremity Assessment Lower Extremity Assessment: Defer to PT evaluation   Cervical / Trunk Assessment Cervical / Trunk  Assessment: Normal   Communication Communication Communication: No difficulties   Cognition Arousal/Alertness: Awake/alert Behavior During Therapy: WFL for tasks assessed/performed Overall Cognitive Status: Within Functional Limits for tasks assessed                                 General Comments: patient is plesant and cooperative during session     General Comments       Exercises     Shoulder Instructions      Home Living Family/patient expects to be discharged to:: Private residence Living Arrangements: Spouse/significant other Available Help at Discharge:  Family;Available 24 hours/day Type of Home: House Home Access: Stairs to enter [employer] of Steps: 2 Entrance Stairs-Rails: None Home Layout: One level     Bathroom Shower/Tub: Tub/shower unit         Home Equipment: Grab bars - tub/shower          Prior Functioning/Environment Prior Level of Function : Independent/Modified Independent             Mobility Comments: walked without AD, no falls in past 6 months          OT Problem List:        OT Treatment/Interventions:      OT Goals(Current goals can be found in the care plan section) Acute Rehab OT Goals Patient Stated Goal: to get home soon OT Goal Formulation: All assessment and education complete, DC therapy  OT Frequency:      Co-evaluation              AM-PAC OT "6 Clicks" Daily Activity     Outcome Measure Help from another person eating meals?: None Help from another person taking care of personal grooming?: None Help from another person toileting, which includes using toliet, bedpan, or urinal?: None Help from another person bathing (including washing, rinsing, drying)?: None Help from another person to put on and taking off regular upper body clothing?: None Help from another person to put on and taking off regular lower body clothing?: None 6 Click Score: 24   End of Session Equipment Utilized During Treatment: Gait belt;Rolling walker (2 wheels) Nurse Communication: Other (comment) (ok to participate in session)  Activity Tolerance: Patient tolerated treatment well Patient left: in chair;with call bell/phone within reach  OT Visit Diagnosis: Pain Pain - part of body:  (abdomen)                Time: 1660-6301 OT Time Calculation (min): 23 min Charges:  OT General Charges $OT Visit: 1 Visit OT Evaluation $OT Eval Low Complexity: 1 Low OT Treatments $Self Care/Home Management : 8-22 mins  Rennie Plowman, MS Acute Rehabilitation Department Office#  [phone]   Willa Rough 10/14/2022, 8:40 AM

## 2022-10-15 ENCOUNTER — Encounter (HOSPITAL_COMMUNITY): Payer: Self-pay | Admitting: Surgery

## 2022-10-15 LAB — CBC
HCT: 28.6 % — ABNORMAL LOW (ref 36.0–46.0)
Hemoglobin: 8.9 g/dL — ABNORMAL LOW (ref 12.0–15.0)
MCH: 28.5 pg (ref 26.0–34.0)
MCHC: 31.1 g/dL (ref 30.0–36.0)
MCV: 91.7 fL (ref 80.0–100.0)
Platelets: 212 10*3/uL (ref 150–400)
RBC: 3.12 MIL/uL — ABNORMAL LOW (ref 3.87–5.11)
RDW: 14.7 % (ref 11.5–15.5)
WBC: 10.2 10*3/uL (ref 4.0–10.5)
nRBC: 0 % (ref 0.0–0.2)

## 2022-10-15 LAB — SURGICAL PATHOLOGY

## 2022-10-15 NOTE — Progress Notes (Signed)
Physical Therapy Treatment Patient Details Name: Jenny Glass MRN: [id number] DOB: 1951-01-23 Today's Date: 10/15/2022   History of Present Illness Patient is a 71 year old female admitted 10/12/22 with diverticulitis,  s/p colectomy with end colostomy, drainage of abscess, salpingo-oophorectomy. PMH: OA, LBP, HTN, RA, L RCR 2017.    PT Comments    Pt ambulated 450' with RW, no loss of balance. She is mobilizing well at an independent level. PT goals met, will sign off.    Recommendations for follow up therapy are one component of a multi-disciplinary discharge planning process, led by the attending physician.  Recommendations may be updated based on patient status, additional functional criteria and insurance authorization.  Follow Up Recommendations  No PT follow up     Assistance Recommended at Discharge Set up Supervision/Assistance  Patient can return home with the following A little help with bathing/dressing/bathroom;Assist for transportation;Assistance with cooking/housework   Equipment Recommendations  Rolling walker (2 wheels)    Recommendations for Other Services       Precautions / Restrictions Precautions Precautions: Other (comment) Precaution Comments: abdominal surgery Restrictions Weight Bearing Restrictions: No     Mobility  Bed Mobility Overal bed mobility: Modified Independent             General bed mobility comments: patient was able to verbalize proper log roll technique and demonstrate with 100% accuracy    Transfers Overall transfer level: Independent Equipment used: Rolling walker (2 wheels) Transfers: Sit to/from Stand Sit to Stand: Modified independent (Device/Increase time)                Ambulation/Gait Ambulation/Gait assistance: Modified independent (Device/Increase time) Gait Distance (Feet): 450 Feet Assistive device: Rolling walker (2 wheels) Gait Pattern/deviations: WFL(Within Functional Limits)       General Gait  Details: steady, no loss of balance   Stairs             Wheelchair Mobility    Modified Rankin (Stroke Patients Only)       Balance Overall balance assessment: Modified Independent                                          Cognition Arousal/Alertness: Awake/alert Behavior During Therapy: WFL for tasks assessed/performed Overall Cognitive Status: Within Functional Limits for tasks assessed                                 General Comments: patient is plesant and cooperative during session        Exercises      General Comments        Pertinent Vitals/Pain Pain Assessment Pain Score: 7  Pain Location: abdomen Pain Descriptors / Indicators: Sharp Pain Intervention(s): Limited activity within patient's tolerance, Monitored during session    Home Living                          Prior Function            PT Goals (current goals can now be found in the care plan section) Acute Rehab PT Goals Patient Stated Goal: to walk farther PT Goal Formulation: With patient/family Time For Goal Achievement: 10/27/22 Potential to Achieve Goals: Good Progress towards PT goals: Goals met/education completed, patient discharged from PT    Frequency  Min 3X/week      PT Plan Current plan remains appropriate    Co-evaluation              AM-PAC PT "6 Clicks" Mobility   Outcome Measure  Help needed turning from your back to your side while in a flat bed without using bedrails?: None Help needed moving from lying on your back to sitting on the side of a flat bed without using bedrails?: None Help needed moving to and from a bed to a chair (including a wheelchair)?: None Help needed standing up from a chair using your arms (e.g., wheelchair or bedside chair)?: None Help needed to walk in hospital room?: None Help needed climbing 3-5 steps with a railing? : None 6 Click Score: 24    End of Session Equipment  Utilized During Treatment: Gait belt Activity Tolerance: Patient tolerated treatment well Patient left: in chair;with call bell/phone within reach;with family/visitor present Nurse Communication: Mobility status PT Visit Diagnosis: Difficulty in walking, not elsewhere classified (R26.2);Pain     Time: 4536-4680 PT Time Calculation (min) (ACUTE ONLY): 11 min  Charges:  $Gait Training: 8-22 mins                     Blondell Reveal Kistler PT 10/15/2022  Acute Rehabilitation Services  Office [phone]

## 2022-10-15 NOTE — Consult Note (Addendum)
[place] Nurse ostomy follow up Pt had colostomy surgery performed on 12/8. Husband at the bedside for pouch change and teaching session.   Stoma type/location: Stoma is red and viable, 1 1/4 inches, slightly above skin level, located in a valley  at 3:00 o'clock and 9:00 o'clock Peristomal assessment: intact skin surrounding Output: 50cc liquid brown stool Ostomy pouching: 1pc. Education provided:  Current pouch is leaking behind the barrier.  Applied barrier ring to attempt to maintain a seal, and one piece convex pouch. Pt watched the process using a hand held mirror and was able to open and close velcro to open. Discussed pouching routines and ordering supplies. Ordered 5 sets of supplies to the room: Use Supplies: barrier ring, Lawson # [id number] and convex pouch Kellie Simmering # [id number] Enrolled patient in [place] Discharge program: Yes, today [facility] team will perform another teaching session later this week.  Pt could benefit from assistance from home health and a referral to the outpatient ostomy clinic after discharge.  Thank-you,  Julien Girt MSN, [place], [place], [place], [place]

## 2022-10-15 NOTE — Progress Notes (Signed)
  Subjective No acute events. Feeling well. Ambulating well on her own. No n/v. Tolerating soft diet. Ostomy functioning well.  Objective: Vital signs in last 24 hours: Temp:  [97.7 F (36.5 C)-98.4 F (36.9 C)] 98.4 F (36.9 C) (12/11 0522) Pulse Rate:  [68-73] 72 (12/11 0522) Resp:  [16-18] 16 (12/11 0522) BP: (120-138)/(80-82) 122/82 (12/11 0522) SpO2:  [96 %-97 %] 96 % (12/11 0522) Last BM Date : 10/12/22  Intake/Output from previous day: 12/10 0701 - 12/11 0700 In: 360 [P.O.:360] Out: 3400 [Urine:2950; Stool:450] Intake/Output this shift: No intake/output data recorded.  Gen: NAD, comfortable CV: RRR Pulm: Normal work of breathing Abd: Soft, appropriate tenderness; nondistended; ostomy pink and productive of gas and stool. Ext: SCDs in place  Lab Results: CBC  Recent Labs    10/14/22 0429 10/15/22 0414  WBC 12.7* 10.2  HGB 8.4* 8.9*  HCT 27.2* 28.6*  PLT 209 212   BMET Recent Labs    10/12/22 1630 10/13/22 0458 10/14/22 0429  NA 137 136  --   K 3.4* 4.1 4.0  CL 105 105  --   CO2 25 24  --   GLUCOSE 178* 166*  --   BUN 11 10  --   CREATININE 1.16* 0.96 0.84  CALCIUM 8.0* 8.5*  --    PT/INR No results for input(s): "LABPROT", "INR" in the last 72 hours. ABG No results for input(s): "PHART", "HCO3" in the last 72 hours.  Invalid input(s): "PCO2", "PO2"  Studies/Results:  Anti-infectives: Anti-infectives (From admission, onward)    Start     Dose/Rate Route Frequency Ordered Stop   10/12/22 1400  neomycin (MYCIFRADIN) tablet 1,000 mg  Status:  Discontinued       See Hyperspace for full Linked Orders Report.   1,000 mg Oral 3 times per day 10/12/22 0939 10/12/22 0943   10/12/22 1400  metroNIDAZOLE (FLAGYL) tablet 1,000 mg  Status:  Discontinued       See Hyperspace for full Linked Orders Report.   1,000 mg Oral 3 times per day 10/12/22 0939 10/12/22 0943   10/12/22 0945  cefoTEtan (CEFOTAN) 2 g in sodium chloride 0.9 % 100 mL IVPB        2  g 200 mL/hr over 30 Minutes Intravenous On call to O.R. 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 07/25/2022   Malnutrition of moderate degree 07/17/2022   Abscess of sigmoid colon due to diverticulitis 07/11/2022   Hyperlipidemia    Chronic back pain    Hypertension    PVD (peripheral vascular disease) ([place])    Chronic low back pain 05/25/2016   Rotator cuff tear 11/10/2015   Rupture of rotator cuff of shoulder 11/10/2015   s/p Procedure(s) 10/12/22 Robotic assisted low anterior resection with end colostomy Robotic assisted left salpingo-oophorectomy Drainage of intra-abdominal abscess 4 x 4 cm Bilateral transversus abdominus plane (TAP) blocks  -Doing well - spent time reviewing her procedure, findings, plans -Soft diet as tolerated -Awaiting WOCN visit. Clinically she is doing great but still needs ostomy teaching prior to be able to be discharged  -Ppx: SQH, SCDs   LOS: 3 days   Nadeen Landau, MD [facility] Surgery, [place]

## 2022-10-16 MED ORDER — LACTATED RINGERS IV SOLN: INTRAVENOUS | Status: DC

## 2022-10-16 NOTE — Progress Notes (Signed)
Mobility Specialist - Progress Note   10/16/22 1537  Mobility  Activity Ambulated with assistance in hallway  Level of Assistance Modified independent, requires aide device or extra time  Assistive Device  (IV Pole)  Distance Ambulated (ft) 500 ft  Activity Response Tolerated well  Mobility Referral Yes  $Mobility charge 1 Mobility   Pt received in bed and agreeable to mobility. No complaints during mobility.  Pt to bed after session with all needs met & husband in room.  [facility]

## 2022-10-16 NOTE — Care Management Important Message (Signed)
Important Message  Patient Details IM Letter given to the Patient. Name: Jenny Glass MRN: [id number] Date of Birth: 01/23/1951   Medicare Important Message Given:  Yes     [place], [place] 10/16/2022, 9:20 AM

## 2022-10-16 NOTE — Progress Notes (Signed)
Mobility Specialist - Progress Note   10/16/22 0958  Mobility  Activity Transferred from bed to chair  Level of Assistance Modified independent, requires aide device or extra time  Assistive Device None  Distance Ambulated (ft) 5 ft  Activity Response Tolerated well  Mobility Referral Yes  $Mobility charge 1 Mobility   Pt received in bed and agreeable to mobility. Agreed to transfer to recliner for breakfast. Pt to recliner after session with all needs met.     [facility]

## 2022-10-16 NOTE — Progress Notes (Signed)
  Subjective No acute events. Feeling well. Ambulating well on her own. No n/v. Early satiety - 1 protein shake and she has no appetite for another 6-8 hrs. Ostomy functioning well.  Objective: Vital signs in last 24 hours: Temp:  [97.5 F (36.4 C)-97.8 F (36.6 C)] 97.8 F (36.6 C) (12/12 0554) Pulse Rate:  [65-70] 70 (12/12 0554) Resp:  [14-18] 18 (12/12 0554) BP: (121-140)/(71-80) 140/74 (12/12 0554) SpO2:  [98 %] 98 % (12/12 0554) Last BM Date : 10/15/22 (from ostomy)  Intake/Output from previous day: 12/11 0701 - 12/12 0700 In: 1080 [P.O.:1080] Out: 3700 [Urine:3300; Stool:400] Intake/Output this shift: No intake/output data recorded.  Gen: NAD, comfortable CV: RRR Pulm: Normal work of breathing Abd: Soft, appropriate tenderness; nondistended; ostomy pink and productive of gas and stool. Ext: SCDs in place  Lab Results: CBC  Recent Labs    10/14/22 0429 10/15/22 0414  WBC 12.7* 10.2  HGB 8.4* 8.9*  HCT 27.2* 28.6*  PLT 209 212   BMET Recent Labs    10/14/22 0429  K 4.0  CREATININE 0.84   PT/INR No results for input(s): "LABPROT", "INR" in the last 72 hours. ABG No results for input(s): "PHART", "HCO3" in the last 72 hours.  Invalid input(s): "PCO2", "PO2"  Studies/Results:  Anti-infectives: Anti-infectives (From admission, onward)    Start     Dose/Rate Route Frequency Ordered Stop   10/12/22 1400  neomycin (MYCIFRADIN) tablet 1,000 mg  Status:  Discontinued       See Hyperspace for full Linked Orders Report.   1,000 mg Oral 3 times per day 10/12/22 0939 10/12/22 0943   10/12/22 1400  metroNIDAZOLE (FLAGYL) tablet 1,000 mg  Status:  Discontinued       See Hyperspace for full Linked Orders Report.   1,000 mg Oral 3 times per day 10/12/22 0939 10/12/22 0943   10/12/22 0945  cefoTEtan (CEFOTAN) 2 g in sodium chloride 0.9 % 100 mL IVPB        2 g 200 mL/hr over 30 Minutes Intravenous On call to O.R. 10/12/22 0939 10/12/22 1202         Assessment/Plan: Patient Active Problem List   Diagnosis Date Noted   S/P laparoscopic-assisted sigmoidectomy 10/12/2022   Medication monitoring encounter 08/09/2022   Polymyalgia rheumatica ([place]) 07/25/2022   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 07/25/2022   Malnutrition of moderate degree 07/17/2022   Abscess of sigmoid colon due to diverticulitis 07/11/2022   Hyperlipidemia    Chronic back pain    Hypertension    PVD (peripheral vascular disease) ([place])    Chronic low back pain 05/25/2016   Rotator cuff tear 11/10/2015   Rupture of rotator cuff of shoulder 11/10/2015   s/p Procedure(s) 10/12/22 Robotic assisted low anterior resection with end colostomy Robotic assisted left salpingo-oophorectomy Drainage of intra-abdominal abscess 4 x 4 cm Bilateral transversus abdominus plane (TAP) blocks  -Doing well -Soft diet as tolerated -Feels she needs more time working with [employer] team before she would be comfortable going home -Restart half rate MIVF until PO intake picks up -Ppx: SQH, SCDs   LOS: 4 days   Nadeen Landau, MD [facility] Surgery, [place]

## 2022-10-17 LAB — CBC WITH DIFFERENTIAL/PLATELET
Abs Immature Granulocytes: 0.11 10*3/uL — ABNORMAL HIGH (ref 0.00–0.07)
Basophils Absolute: 0.1 10*3/uL (ref 0.0–0.1)
Basophils Relative: 1 %
Eosinophils Absolute: 0.7 10*3/uL — ABNORMAL HIGH (ref 0.0–0.5)
Eosinophils Relative: 7 %
HCT: 32.3 % — ABNORMAL LOW (ref 36.0–46.0)
Hemoglobin: 9.9 g/dL — ABNORMAL LOW (ref 12.0–15.0)
Immature Granulocytes: 1 %
Lymphocytes Relative: 28 %
Lymphs Abs: 2.8 10*3/uL (ref 0.7–4.0)
MCH: 28 pg (ref 26.0–34.0)
MCHC: 30.7 g/dL (ref 30.0–36.0)
MCV: 91.2 fL (ref 80.0–100.0)
Monocytes Absolute: 1 10*3/uL (ref 0.1–1.0)
Monocytes Relative: 10 %
Neutro Abs: 5.2 10*3/uL (ref 1.7–7.7)
Neutrophils Relative %: 53 %
Platelets: 251 10*3/uL (ref 150–400)
RBC: 3.54 MIL/uL — ABNORMAL LOW (ref 3.87–5.11)
RDW: 15.1 % (ref 11.5–15.5)
WBC: 9.8 10*3/uL (ref 4.0–10.5)
nRBC: 0 % (ref 0.0–0.2)

## 2022-10-17 LAB — BASIC METABOLIC PANEL
Anion gap: 8 (ref 5–15)
BUN: 18 mg/dL (ref 8–23)
CO2: 26 mmol/L (ref 22–32)
Calcium: 8.8 mg/dL — ABNORMAL LOW (ref 8.9–10.3)
Chloride: 107 mmol/L (ref 98–111)
Creatinine, Ser: 0.88 mg/dL (ref 0.44–1.00)
GFR, Estimated: >60 mL/min (ref >60)
Glucose, Bld: 94 mg/dL (ref 70–99)
Potassium: 4 mmol/L (ref 3.5–5.1)
Sodium: 141 mmol/L (ref 135–145)

## 2022-10-17 LAB — HEMOGLOBIN A1C
Hgb A1c MFr Bld: 5.8 % — ABNORMAL HIGH (ref 4.8–5.6)
Mean Plasma Glucose: 120 mg/dL

## 2022-10-17 NOTE — Progress Notes (Signed)
Mobility Specialist - Progress Note   10/17/22 1419  Mobility  Activity Ambulated with assistance in hallway  Level of Assistance Independent after set-up  Assistive Device  (IV Pole)  Distance Ambulated (ft) 500 ft  Activity Response Tolerated well  Mobility Referral Yes  $Mobility charge 1 Mobility   Pt received in bed and agreeable to mobility. No complaints during mobility.  Pt to bed after session with all needs met & husband in room.   [facility]

## 2022-10-17 NOTE — Consult Note (Addendum)
[place] Nurse ostomy follow up Pt had colostomy surgery performed on 12/8. Husband at the bedside for pouch change and teaching session.   Stoma type/location: Stoma is red and viable, 1 1/4 inches, slightly above skin level, located in a valley  at 3:00 o'clock and 9:00 o'clock Peristomal assessment: intact skin surrounding Output: 50cc liquid brown stool Ostomy pouching: 1pc. Education provided:  Pt was able to stretch barrier ring to fit and attach to pouch, then apply pouch to stoma using hand held mirror.  She was able to open and close velcro to empty. Reviewed pouching routines and ordering supplies.  Demonstrated use of ostomy belt and provided one in case she prefers to use it later after discharge. 5 sets of supplies in the room for use after discharge: Use Supplies: barrier ring, Lawson # [id number] and flexible convex pouch Lawson # [id number]. Educational materials left at bedside.  Enrolled patient in [place] Start Discharge program: Yes, previously Pt could benefit from assistance from home health and a referral to the outpatient ostomy clinic after discharge.  Thank-you,  Julien Girt MSN, [place], [place], [place], [place]

## 2022-10-17 NOTE — TOC Transition Note (Signed)
Transition of Care [facility]) - CM/SW Discharge Note   Patient Details  Name: Jenny Glass MRN: [id number] Date of Birth: 10/29/1951  [facility] CM/SW Contact:  Lennart Pall, LCSW Phone Number: 10/17/2022, 2:01 PM   Clinical Narrative:     Met with pt and spouse today to review orders/ recommendations for [facility] and RW.  They are agreeable and have no agency preferences.  HHRN arranged with [facility].  RW ordered with [employer] for delivery to room today.  Pt a little tearful about this new colostomy, however, spouse very supportive.  Pt eager to return home.  Final next level of care: [place] Barriers to Discharge: No Barriers Identified   Patient Goals and CMS Choice Patient states their goals for this hospitalization and ongoing recovery are:: return home      Discharge Placement                       Discharge Plan and Services                DME Arranged: Walker rolling DME Agency: [employer] Date DME Agency Contacted: 10/17/22 Time DME Agency Contacted: [id number] Representative spoke with at DME Agency: Erasmo Downer HH Arranged: RN [place] Agency: [place] Date [place]: 10/17/22 Time [place]: [id number] Representative spoke with at [place]: Amy  Social Determinants of Health ([place]) Interventions     Readmission Risk Interventions    10/17/2022    1:55 PM  Readmission Risk Prevention Plan  Post Dischage Appt Complete  Medication Screening Complete  Transportation Screening Complete

## 2022-10-17 NOTE — Discharge Summary (Signed)
Patient ID: ULANI DEGRASSE MRN: [id number] DOB/AGE: 09-May-1951 71 y.o.  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 07/25/2022   Malnutrition of moderate degree 07/17/2022   Abscess of sigmoid colon due to diverticulitis 07/11/2022   Hyperlipidemia    Chronic back pain    Hypertension    PVD (peripheral vascular disease) ([place])    Chronic low back pain 05/25/2016   Rotator cuff tear 11/10/2015   Rupture of rotator cuff of shoulder 11/10/2015    Consultants None  Procedures OR 10/12/22 Robotic assisted low anterior resection with end colostomy Robotic assisted left salpingo-oophorectomy Drainage of intra-abdominal abscess 4 x 4 cm Bilateral transversus abdominus plane (TAP) blocks  Hospital Course: She was admitted postoperatively where she recovered appropriately. Her diet was advanced and ostomy began working well. She underwent wound ostomy nurse evaluation, education and teaching. On 10/17/22, she was comfortable with managing her ostomy and interested in going home. She is clinically stable for discharge. Home health arranged. An outpatient referral to the wound ostomy clinic has also been made. Follow-up in my office arranged.    Allergies as of 10/17/2022   No Known Allergies      Medication List     TAKE these medications    CALCIUM 600 + D PO Take 1 tablet by mouth daily.   cholecalciferol 25 MCG (1000 UNIT) tablet Commonly known as: VITAMIN D3 Take 2,000 Units by mouth daily.   predniSONE 1 MG tablet Commonly known as: DELTASONE Take 2 mg by mouth daily.   traMADol 50 MG tablet Commonly known as: Ultram Take 1 tablet (50 mg total) by mouth every 6 (six) hours as needed for up to 5 days (postop pain not  controlled with tylenol/ibuprofen first).               Durable Medical Equipment  (From admission, onward)           Start     Ordered   10/13/22 1116  For home use only DME Walker rolling  Once       Question Answer Comment  Walker: With 5 Inch Wheels   Patient needs a walker to treat with the following condition Difficulty in walking, not elsewhere classified      10/13/22 1115              Follow-up Information     Jayleena Stille, Sharon Mt, MD. Schedule an appointment as soon as possible for a visit.   Specialties: General Surgery, Colon and Rectal Surgery Contact information: [place] [address] [place]. Follow up.   Why: [place] to provide home nursing visits Contact information: [place] [address] [phone]                 Inmer Nix M. Dema Severin, M.D. [facility]

## 2022-10-17 NOTE — Progress Notes (Signed)
  Subjective No acute events. Feeling well. Ambulating well on her own. No n/v. Ambulating. Ostomy functioning well. Awaiting WOCN for addn'l teaching  Objective: Vital signs in last 24 hours: Temp:  [97.6 F (36.4 C)-98.6 F (37 C)] 98.2 F (36.8 C) (12/13 0528) Pulse Rate:  [66-70] 66 (12/13 0528) Resp:  [17-18] 18 (12/13 0528) BP: (131-133)/(69-82) 133/80 (12/13 0528) SpO2:  [98 %-99 %] 98 % (12/13 0528) Weight:  [72.3 kg] 72.3 kg (12/13 0500) Last BM Date : 10/16/22  Intake/Output from previous day: 12/12 0701 - 12/13 0700 In: 2298.7 [P.O.:1440; I.V.:858.7] Out: 2610 [Urine:2400; Stool:210] Intake/Output this shift: No intake/output data recorded.  Gen: NAD, comfortable CV: RRR Pulm: Normal work of breathing Abd: Soft, nontender; nondistended; ostomy pink and productive of gas and stool. Ext: SCDs in place  Lab Results: CBC  Recent Labs    10/15/22 0414 10/17/22 0456  WBC 10.2 9.8  HGB 8.9* 9.9*  HCT 28.6* 32.3*  PLT 212 251   BMET Recent Labs    10/17/22 0456  NA 141  K 4.0  CL 107  CO2 26  GLUCOSE 94  BUN 18  CREATININE 0.88  CALCIUM 8.8*   PT/INR No results for input(s): "LABPROT", "INR" in the last 72 hours. ABG No results for input(s): "PHART", "HCO3" in the last 72 hours.  Invalid input(s): "PCO2", "PO2"  Studies/Results:  Anti-infectives: Anti-infectives (From admission, onward)    Start     Dose/Rate Route Frequency Ordered Stop   10/12/22 1400  neomycin (MYCIFRADIN) tablet 1,000 mg  Status:  Discontinued       See Hyperspace for full Linked Orders Report.   1,000 mg Oral 3 times per day 10/12/22 0939 10/12/22 0943   10/12/22 1400  metroNIDAZOLE (FLAGYL) tablet 1,000 mg  Status:  Discontinued       See Hyperspace for full Linked Orders Report.   1,000 mg Oral 3 times per day 10/12/22 0939 10/12/22 0943   10/12/22 0945  cefoTEtan (CEFOTAN) 2 g in sodium chloride 0.9 % 100 mL IVPB        2 g 200 mL/hr over 30 Minutes Intravenous On  call to O.R. 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 07/25/2022   Malnutrition of moderate degree 07/17/2022   Abscess of sigmoid colon due to diverticulitis 07/11/2022   Hyperlipidemia    Chronic back pain    Hypertension    PVD (peripheral vascular disease) ([facility])    Chronic low back pain 05/25/2016   Rotator cuff tear 11/10/2015   Rupture of rotator cuff of shoulder 11/10/2015   s/p Procedure(s) 10/12/22 Robotic assisted low anterior resection with end colostomy Robotic assisted left salpingo-oophorectomy Drainage of intra-abdominal abscess 4 x 4 cm Bilateral transversus abdominus plane (TAP) blocks  -Doing well -Soft diet as tolerated -Feels she needs more time working with [employer] team - didn't see yesterday; hopefully today and can go home later today -Ppx: [place], SCDs   LOS: 5 days   Nadeen Landau, MD [facility], [place]

## 2022-10-17 NOTE — Progress Notes (Signed)
Discharge instructions given to patient and all questions were answered.  

## 2022-10-19 DIAGNOSIS — E785 Hyperlipidemia, unspecified: Secondary | ICD-10-CM | POA: Diagnosis not present

## 2022-10-19 DIAGNOSIS — I10 Essential (primary) hypertension: Secondary | ICD-10-CM | POA: Diagnosis not present

## 2022-10-19 DIAGNOSIS — Z433 Encounter for attention to colostomy: Secondary | ICD-10-CM | POA: Diagnosis not present

## 2022-10-19 DIAGNOSIS — M545 Low back pain, unspecified: Secondary | ICD-10-CM | POA: Diagnosis not present

## 2022-10-19 DIAGNOSIS — G8929 Other chronic pain: Secondary | ICD-10-CM | POA: Diagnosis not present

## 2022-10-19 DIAGNOSIS — I739 Peripheral vascular disease, unspecified: Secondary | ICD-10-CM | POA: Diagnosis not present

## 2022-10-19 DIAGNOSIS — Z48815 Encounter for surgical aftercare following surgery on the digestive system: Secondary | ICD-10-CM | POA: Diagnosis not present

## 2022-10-19 DIAGNOSIS — E440 Moderate protein-calorie malnutrition: Secondary | ICD-10-CM | POA: Diagnosis not present

## 2022-10-19 DIAGNOSIS — M549 Dorsalgia, unspecified: Secondary | ICD-10-CM | POA: Diagnosis not present

## 2022-10-19 DIAGNOSIS — M353 Polymyalgia rheumatica: Secondary | ICD-10-CM | POA: Diagnosis not present

## 2022-10-23 DIAGNOSIS — Z433 Encounter for attention to colostomy: Secondary | ICD-10-CM | POA: Diagnosis not present

## 2022-10-23 DIAGNOSIS — M353 Polymyalgia rheumatica: Secondary | ICD-10-CM | POA: Diagnosis not present

## 2022-10-23 DIAGNOSIS — Z48815 Encounter for surgical aftercare following surgery on the digestive system: Secondary | ICD-10-CM | POA: Diagnosis not present

## 2022-10-23 DIAGNOSIS — I739 Peripheral vascular disease, unspecified: Secondary | ICD-10-CM | POA: Diagnosis not present

## 2022-10-23 DIAGNOSIS — E440 Moderate protein-calorie malnutrition: Secondary | ICD-10-CM | POA: Diagnosis not present

## 2022-10-23 DIAGNOSIS — I10 Essential (primary) hypertension: Secondary | ICD-10-CM | POA: Diagnosis not present

## 2022-10-26 DIAGNOSIS — E440 Moderate protein-calorie malnutrition: Secondary | ICD-10-CM | POA: Diagnosis not present

## 2022-10-26 DIAGNOSIS — M353 Polymyalgia rheumatica: Secondary | ICD-10-CM | POA: Diagnosis not present

## 2022-10-26 DIAGNOSIS — Z433 Encounter for attention to colostomy: Secondary | ICD-10-CM | POA: Diagnosis not present

## 2022-10-26 DIAGNOSIS — I739 Peripheral vascular disease, unspecified: Secondary | ICD-10-CM | POA: Diagnosis not present

## 2022-10-26 DIAGNOSIS — Z48815 Encounter for surgical aftercare following surgery on the digestive system: Secondary | ICD-10-CM | POA: Diagnosis not present

## 2022-10-26 DIAGNOSIS — I10 Essential (primary) hypertension: Secondary | ICD-10-CM | POA: Diagnosis not present

## 2022-11-01 DIAGNOSIS — Z79899 Other long term (current) drug therapy: Secondary | ICD-10-CM | POA: Diagnosis not present

## 2022-11-01 DIAGNOSIS — M0609 Rheumatoid arthritis without rheumatoid factor, multiple sites: Secondary | ICD-10-CM | POA: Diagnosis not present

## 2022-11-09 DIAGNOSIS — I10 Essential (primary) hypertension: Secondary | ICD-10-CM | POA: Diagnosis not present

## 2022-11-09 DIAGNOSIS — Z48815 Encounter for surgical aftercare following surgery on the digestive system: Secondary | ICD-10-CM | POA: Diagnosis not present

## 2022-11-09 DIAGNOSIS — M353 Polymyalgia rheumatica: Secondary | ICD-10-CM | POA: Diagnosis not present

## 2022-11-09 DIAGNOSIS — Z433 Encounter for attention to colostomy: Secondary | ICD-10-CM | POA: Diagnosis not present

## 2022-11-09 DIAGNOSIS — E440 Moderate protein-calorie malnutrition: Secondary | ICD-10-CM | POA: Diagnosis not present

## 2022-11-09 DIAGNOSIS — I739 Peripheral vascular disease, unspecified: Secondary | ICD-10-CM | POA: Diagnosis not present

## 2022-11-12 ENCOUNTER — Ambulatory Visit (HOSPITAL_COMMUNITY): Payer: Medicare Other | Admitting: Nurse Practitioner

## 2022-11-12 ENCOUNTER — Ambulatory Visit (HOSPITAL_COMMUNITY)
Admission: RE | Admit: 2022-11-12 | Discharge: 2022-11-12 | Disposition: A | Discharge status: Home / Self Care | Payer: Medicare Other | Source: Ambulatory Visit | Attending: Nurse Practitioner | Admitting: Nurse Practitioner

## 2022-11-12 DIAGNOSIS — Z433 Encounter for attention to colostomy: Secondary | ICD-10-CM | POA: Insufficient documentation

## 2022-11-12 DIAGNOSIS — K9400 Colostomy complication, unspecified: Secondary | ICD-10-CM | POA: Diagnosis not present

## 2022-11-12 DIAGNOSIS — Z933 Colostomy status: Secondary | ICD-10-CM | POA: Diagnosis present

## 2022-11-12 NOTE — Progress Notes (Signed)
[facility]   Reason for visit:  LLQ colostomy HPI:   Past Medical History:  Diagnosis Date   Arthritis    Carotid artery stenosis    Chronic back pain    Diverticulitis    Hyperlipidemia    Hypertension    Numbness and tingling    hands bilat and lower legs bilat comes and goes    Rheumatoid arthritis (HCC)    polymyalgia rheumatica   UTI (lower urinary tract infection)    Family History  Problem Relation Age of Onset   Cancer Mother    Cancer Father    Colon cancer Neg Hx    Esophageal cancer Neg Hx    Rectal cancer Neg Hx    Stomach cancer Neg Hx    No Known Allergies Current Outpatient Medications  Medication Sig Dispense Refill Last Dose   Calcium Carb-Cholecalciferol (CALCIUM 600 + D PO) Take 1 tablet by mouth daily.      cholecalciferol (VITAMIN D3) 25 MCG (1000 UNIT) tablet Take 2,000 Units by mouth daily.      predniSONE (DELTASONE) 1 MG tablet Take 2 mg by mouth daily.      No current facility-administered medications for this encounter.   ROS  Review of Systems  Gastrointestinal:        LLQ colostomy  Skin: Negative.   All other systems reviewed and are negative.  Vital signs:  BP 130/87 (BP Location: Right Arm)   Pulse 67   Temp 98 F (36.7 C) (Oral)   Resp 17   SpO2 98%  Exam:  Physical Exam Vitals reviewed.  Constitutional:      Appearance: Normal appearance.  Abdominal:     Palpations: Abdomen is soft.  Skin:    General: Skin is warm and dry.  Neurological:     Mental Status: She is alert and oriented to person, place, and time.  Psychiatric:        Mood and Affect: Mood normal.        Behavior: Behavior normal.     Stoma type/location:  LLQ colostomy Stomal assessment/size:  1" pink and moist Peristomal assessment:  intact Treatment options for stomal/peristomal skin: barrier ring and 1piece convex pouch Output: soft brown stool Ostomy pouching: 1pc. convex Education provided:  spouse performs pouch care.    Patient states pouch will just "pop" open.  We discuss releasing flatus by burping.  They have been doing that, she reports.  She is not happy with her home health services.  I  have encouraged her to use that time to perform pouch change in her own bathroom to gain more confidence.  She doesn't feel the staff are very involved in teaching her. I requested she try one more time on next visit and request to do a pouch change even if it is not time.      Impression/dx  Colostomy Discussion  Continue 1 piece pouch Adding ostomy belt to provide support and help keep seal intact.  Samples given  HH order has not arrived.  Plan  See back in 2 weeks or as needed.     Visit time: 55 minutes.   Domenic Moras FNP-BC

## 2022-11-12 NOTE — Discharge Instructions (Signed)
Added ostomy belt Will enroll with [facility]

## 2022-11-13 DIAGNOSIS — K9400 Colostomy complication, unspecified: Secondary | ICD-10-CM | POA: Insufficient documentation

## 2022-11-13 DIAGNOSIS — I739 Peripheral vascular disease, unspecified: Secondary | ICD-10-CM | POA: Diagnosis not present

## 2022-11-13 DIAGNOSIS — I10 Essential (primary) hypertension: Secondary | ICD-10-CM | POA: Diagnosis not present

## 2022-11-13 DIAGNOSIS — E440 Moderate protein-calorie malnutrition: Secondary | ICD-10-CM | POA: Diagnosis not present

## 2022-11-13 DIAGNOSIS — M353 Polymyalgia rheumatica: Secondary | ICD-10-CM | POA: Diagnosis not present

## 2022-11-13 DIAGNOSIS — Z433 Encounter for attention to colostomy: Secondary | ICD-10-CM | POA: Diagnosis not present

## 2022-11-13 DIAGNOSIS — Z48815 Encounter for surgical aftercare following surgery on the digestive system: Secondary | ICD-10-CM | POA: Diagnosis not present

## 2022-11-14 DIAGNOSIS — M353 Polymyalgia rheumatica: Secondary | ICD-10-CM | POA: Diagnosis not present

## 2022-11-14 DIAGNOSIS — R109 Unspecified abdominal pain: Secondary | ICD-10-CM | POA: Diagnosis not present

## 2022-11-14 DIAGNOSIS — E782 Mixed hyperlipidemia: Secondary | ICD-10-CM | POA: Diagnosis not present

## 2022-11-14 DIAGNOSIS — N1831 Chronic kidney disease, stage 3a: Secondary | ICD-10-CM | POA: Diagnosis not present

## 2022-11-14 DIAGNOSIS — I779 Disorder of arteries and arterioles, unspecified: Secondary | ICD-10-CM | POA: Diagnosis not present

## 2022-11-18 DIAGNOSIS — M545 Low back pain, unspecified: Secondary | ICD-10-CM | POA: Diagnosis not present

## 2022-11-18 DIAGNOSIS — M549 Dorsalgia, unspecified: Secondary | ICD-10-CM | POA: Diagnosis not present

## 2022-11-18 DIAGNOSIS — Z48815 Encounter for surgical aftercare following surgery on the digestive system: Secondary | ICD-10-CM | POA: Diagnosis not present

## 2022-11-18 DIAGNOSIS — E785 Hyperlipidemia, unspecified: Secondary | ICD-10-CM | POA: Diagnosis not present

## 2022-11-18 DIAGNOSIS — Z433 Encounter for attention to colostomy: Secondary | ICD-10-CM | POA: Diagnosis not present

## 2022-11-18 DIAGNOSIS — M353 Polymyalgia rheumatica: Secondary | ICD-10-CM | POA: Diagnosis not present

## 2022-11-18 DIAGNOSIS — E440 Moderate protein-calorie malnutrition: Secondary | ICD-10-CM | POA: Diagnosis not present

## 2022-11-18 DIAGNOSIS — I739 Peripheral vascular disease, unspecified: Secondary | ICD-10-CM | POA: Diagnosis not present

## 2022-11-18 DIAGNOSIS — I10 Essential (primary) hypertension: Secondary | ICD-10-CM | POA: Diagnosis not present

## 2022-11-18 DIAGNOSIS — G8929 Other chronic pain: Secondary | ICD-10-CM | POA: Diagnosis not present

## 2022-11-20 ENCOUNTER — Ambulatory Visit: Payer: Medicare Other | Admitting: Cardiology

## 2022-11-20 DIAGNOSIS — J0100 Acute maxillary sinusitis, unspecified: Secondary | ICD-10-CM | POA: Diagnosis not present

## 2022-11-20 DIAGNOSIS — J209 Acute bronchitis, unspecified: Secondary | ICD-10-CM | POA: Diagnosis not present

## 2022-11-21 DIAGNOSIS — Z433 Encounter for attention to colostomy: Secondary | ICD-10-CM | POA: Diagnosis not present

## 2022-11-21 DIAGNOSIS — Z48815 Encounter for surgical aftercare following surgery on the digestive system: Secondary | ICD-10-CM | POA: Diagnosis not present

## 2022-11-21 DIAGNOSIS — M353 Polymyalgia rheumatica: Secondary | ICD-10-CM | POA: Diagnosis not present

## 2022-11-21 DIAGNOSIS — E440 Moderate protein-calorie malnutrition: Secondary | ICD-10-CM | POA: Diagnosis not present

## 2022-11-21 DIAGNOSIS — I10 Essential (primary) hypertension: Secondary | ICD-10-CM | POA: Diagnosis not present

## 2022-11-21 DIAGNOSIS — I739 Peripheral vascular disease, unspecified: Secondary | ICD-10-CM | POA: Diagnosis not present

## 2022-12-04 DIAGNOSIS — M1991 Primary osteoarthritis, unspecified site: Secondary | ICD-10-CM | POA: Diagnosis not present

## 2022-12-04 DIAGNOSIS — M48061 Spinal stenosis, lumbar region without neurogenic claudication: Secondary | ICD-10-CM | POA: Diagnosis not present

## 2022-12-04 DIAGNOSIS — E663 Overweight: Secondary | ICD-10-CM | POA: Diagnosis not present

## 2022-12-04 DIAGNOSIS — Z6826 Body mass index (BMI) 26.0-26.9, adult: Secondary | ICD-10-CM | POA: Diagnosis not present

## 2022-12-04 DIAGNOSIS — Z79899 Other long term (current) drug therapy: Secondary | ICD-10-CM | POA: Diagnosis not present

## 2022-12-04 DIAGNOSIS — M0609 Rheumatoid arthritis without rheumatoid factor, multiple sites: Secondary | ICD-10-CM | POA: Diagnosis not present

## 2022-12-27 DIAGNOSIS — M0609 Rheumatoid arthritis without rheumatoid factor, multiple sites: Secondary | ICD-10-CM | POA: Diagnosis not present

## 2023-01-03 ENCOUNTER — Other Ambulatory Visit: Payer: Self-pay | Admitting: Internal Medicine

## 2023-01-03 DIAGNOSIS — Z1231 Encounter for screening mammogram for malignant neoplasm of breast: Secondary | ICD-10-CM

## 2023-01-15 DIAGNOSIS — H3550 Unspecified hereditary retinal dystrophy: Secondary | ICD-10-CM | POA: Diagnosis not present

## 2023-02-15 ENCOUNTER — Ambulatory Visit
Admission: RE | Admit: 2023-02-15 | Discharge: 2023-02-15 | Disposition: A | Discharge status: Home / Self Care | Payer: Medicare Other | Source: Ambulatory Visit | Attending: Internal Medicine | Admitting: Internal Medicine

## 2023-02-15 DIAGNOSIS — Z1231 Encounter for screening mammogram for malignant neoplasm of breast: Secondary | ICD-10-CM

## 2023-02-21 DIAGNOSIS — E782 Mixed hyperlipidemia: Secondary | ICD-10-CM | POA: Diagnosis not present

## 2023-02-21 DIAGNOSIS — N1831 Chronic kidney disease, stage 3a: Secondary | ICD-10-CM | POA: Diagnosis not present

## 2023-02-21 DIAGNOSIS — M0609 Rheumatoid arthritis without rheumatoid factor, multiple sites: Secondary | ICD-10-CM | POA: Diagnosis not present

## 2023-02-21 DIAGNOSIS — I779 Disorder of arteries and arterioles, unspecified: Secondary | ICD-10-CM | POA: Diagnosis not present

## 2023-02-21 DIAGNOSIS — Z0000 Encounter for general adult medical examination without abnormal findings: Secondary | ICD-10-CM | POA: Diagnosis not present

## 2023-02-21 DIAGNOSIS — M353 Polymyalgia rheumatica: Secondary | ICD-10-CM | POA: Diagnosis not present

## 2023-02-21 DIAGNOSIS — R5383 Other fatigue: Secondary | ICD-10-CM | POA: Diagnosis not present

## 2023-02-28 DIAGNOSIS — E782 Mixed hyperlipidemia: Secondary | ICD-10-CM | POA: Diagnosis not present

## 2023-02-28 DIAGNOSIS — I10 Essential (primary) hypertension: Secondary | ICD-10-CM | POA: Diagnosis not present

## 2023-02-28 DIAGNOSIS — I779 Disorder of arteries and arterioles, unspecified: Secondary | ICD-10-CM | POA: Diagnosis not present

## 2023-02-28 DIAGNOSIS — M353 Polymyalgia rheumatica: Secondary | ICD-10-CM | POA: Diagnosis not present

## 2023-02-28 DIAGNOSIS — M8580 Other specified disorders of bone density and structure, unspecified site: Secondary | ICD-10-CM | POA: Diagnosis not present

## 2023-02-28 DIAGNOSIS — N1831 Chronic kidney disease, stage 3a: Secondary | ICD-10-CM | POA: Diagnosis not present

## 2023-03-18 ENCOUNTER — Other Ambulatory Visit (HOSPITAL_COMMUNITY): Payer: Self-pay | Admitting: Surgery

## 2023-03-18 DIAGNOSIS — Z933 Colostomy status: Secondary | ICD-10-CM | POA: Diagnosis not present

## 2023-03-18 DIAGNOSIS — K5720 Diverticulitis of large intestine with perforation and abscess without bleeding: Secondary | ICD-10-CM

## 2023-03-25 ENCOUNTER — Ambulatory Visit (HOSPITAL_COMMUNITY)
Admission: RE | Admit: 2023-03-25 | Discharge: 2023-03-25 | Disposition: A | Discharge status: Home / Self Care | Payer: Medicare Other | Source: Ambulatory Visit | Attending: Surgery | Admitting: Surgery

## 2023-03-25 DIAGNOSIS — K5730 Diverticulosis of large intestine without perforation or abscess without bleeding: Secondary | ICD-10-CM | POA: Diagnosis not present

## 2023-03-25 DIAGNOSIS — Z933 Colostomy status: Secondary | ICD-10-CM | POA: Diagnosis not present

## 2023-03-25 DIAGNOSIS — K5720 Diverticulitis of large intestine with perforation and abscess without bleeding: Secondary | ICD-10-CM | POA: Insufficient documentation

## 2023-03-25 DIAGNOSIS — Z01818 Encounter for other preprocedural examination: Secondary | ICD-10-CM | POA: Diagnosis not present

## 2023-03-25 MED ORDER — IOHEXOL 300 MG/ML  SOLN
200.0000 mL | Freq: Once | INTRAMUSCULAR | Status: AC | PRN
  Administered 2023-03-25: 200 mL

## 2023-04-02 ENCOUNTER — Other Ambulatory Visit: Payer: Self-pay

## 2023-04-02 ENCOUNTER — Encounter (HOSPITAL_COMMUNITY): Payer: Self-pay | Admitting: Surgery

## 2023-04-05 ENCOUNTER — Ambulatory Visit: Payer: Self-pay | Admitting: Surgery

## 2023-04-06 NOTE — Anesthesia Preprocedure Evaluation (Signed)
Anesthesia Evaluation  Patient identified by MRN, date of birth, ID band Patient awake    Reviewed: Allergy & Precautions, NPO status , Patient's Chart, lab work & pertinent test results  History of Anesthesia Complications Negative for: history of anesthetic complications  Airway Mallampati: III  TM Distance: >3 FB Neck ROM: Full   Comment: Previous grade I view with Miller 2, easy mask Dental  (+) Dental Advisory Given   Pulmonary neg shortness of breath, neg sleep apnea, neg COPD, neg recent URI   Pulmonary exam normal breath sounds clear to auscultation       Cardiovascular hypertension, (-) angina + Peripheral Vascular Disease  (-) Past MI, (-) Cardiac Stents and (-) CABG (-) dysrhythmias  Rhythm:Regular Rate:Normal  Carotid artery stenosis, HLD  Echocardiogram 05/23/2022:  Normal LV systolic function with visual EF 60-65%. Left ventricle cavity  is normal in size. Normal left ventricular wall thickness. Normal global  wall motion. Normal diastolic filling pattern, normal LAP.  Mild (Grade I) aortic regurgitation.  Mild tricuspid regurgitation. No evidence of pulmonary hypertension. RVSP  measures 32 mmHg.  The aortic root is normal. Proximal ascending aorta upper limit of normal,  37 mm.  No prior study for comparison.   Exercise treadmill stress test 05/17/2022: Exercise treadmill stress test performed using Bruce protocol.  Patient reached 8 METS, and 89% of age predicted maximum heart rate.  Exercise capacity was fair.  No chest pain reported.  Normal heart rate and hemodynamic response. Stress EKG revealed no ischemic changes. Low risk study.    Neuro/Psych neg Seizures  Neuromuscular disease (chronic back pain)    GI/Hepatic Neg liver ROS,neg GERD  ,,Colostomy, diverticulitis   Endo/Other  negative endocrine ROS    Renal/GU negative Renal ROS     Musculoskeletal  (+) Arthritis , Rheumatoid disorders,   Fibromyalgia -PMR   Abdominal   Peds  Hematology negative hematology ROS (+)   Anesthesia Other Findings   Reproductive/Obstetrics                             Anesthesia Physical Anesthesia Plan  ASA: 3  Anesthesia Plan: MAC   Post-op Pain Management: Minimal or no pain anticipated   Induction: Intravenous  PONV Risk Score and Plan: 2 and Propofol infusion and Treatment may vary due to age or medical condition  Airway Management Planned: Natural Airway and Nasal Cannula  Additional Equipment:   Intra-op Plan:   Post-operative Plan:   Informed Consent: I have reviewed the patients History and Physical, chart, labs and discussed the procedure including the risks, benefits and alternatives for the proposed anesthesia with the patient or authorized representative who has indicated his/her understanding and acceptance.     Dental advisory given  Plan Discussed with: CRNA and Anesthesiologist  Anesthesia Plan Comments: (Discussed with patient risks of MAC including, but not limited to, minor pain or discomfort, hearing people in the room, and possible need for backup general anesthesia. Risks for general anesthesia also discussed including, but not limited to, sore throat, hoarse voice, chipped/damaged teeth, injury to vocal cords, nausea and vomiting, allergic reactions, lung infection, heart attack, stroke, and death. All questions answered. )        Anesthesia Quick Evaluation

## 2023-04-08 ENCOUNTER — Ambulatory Visit (HOSPITAL_BASED_OUTPATIENT_CLINIC_OR_DEPARTMENT_OTHER): Payer: Medicare Other | Admitting: Anesthesiology

## 2023-04-08 ENCOUNTER — Other Ambulatory Visit: Payer: Self-pay

## 2023-04-08 ENCOUNTER — Ambulatory Visit (HOSPITAL_COMMUNITY)
Admission: RE | Admit: 2023-04-08 | Discharge: 2023-04-08 | Disposition: A | Discharge status: Home / Self Care | Payer: Medicare Other | Attending: Surgery | Admitting: Surgery

## 2023-04-08 ENCOUNTER — Ambulatory Visit (HOSPITAL_COMMUNITY): Payer: Medicare Other | Admitting: Anesthesiology

## 2023-04-08 ENCOUNTER — Encounter (HOSPITAL_COMMUNITY)
Admission: RE | Disposition: A | Discharge status: Home / Self Care | Payer: Self-pay | Source: Home / Self Care | Attending: Surgery

## 2023-04-08 ENCOUNTER — Encounter (HOSPITAL_COMMUNITY): Payer: Self-pay | Admitting: Surgery

## 2023-04-08 DIAGNOSIS — M353 Polymyalgia rheumatica: Secondary | ICD-10-CM | POA: Diagnosis not present

## 2023-04-08 DIAGNOSIS — Z01818 Encounter for other preprocedural examination: Secondary | ICD-10-CM | POA: Insufficient documentation

## 2023-04-08 DIAGNOSIS — I10 Essential (primary) hypertension: Secondary | ICD-10-CM | POA: Insufficient documentation

## 2023-04-08 DIAGNOSIS — K5730 Diverticulosis of large intestine without perforation or abscess without bleeding: Secondary | ICD-10-CM | POA: Insufficient documentation

## 2023-04-08 DIAGNOSIS — M1990 Unspecified osteoarthritis, unspecified site: Secondary | ICD-10-CM | POA: Diagnosis not present

## 2023-04-08 DIAGNOSIS — Z980 Intestinal bypass and anastomosis status: Secondary | ICD-10-CM | POA: Insufficient documentation

## 2023-04-08 DIAGNOSIS — I739 Peripheral vascular disease, unspecified: Secondary | ICD-10-CM | POA: Diagnosis not present

## 2023-04-08 DIAGNOSIS — M797 Fibromyalgia: Secondary | ICD-10-CM | POA: Diagnosis not present

## 2023-04-08 DIAGNOSIS — E785 Hyperlipidemia, unspecified: Secondary | ICD-10-CM | POA: Insufficient documentation

## 2023-04-08 HISTORY — DX: Pneumonia, unspecified organism: J18.9

## 2023-04-08 HISTORY — PX: FLEXIBLE SIGMOIDOSCOPY: SHX5431

## 2023-04-08 HISTORY — DX: Fibromyalgia: M79.7

## 2023-04-08 SURGERY — SIGMOIDOSCOPY, FLEXIBLE
Anesthesia: Monitor Anesthesia Care

## 2023-04-08 MED ORDER — LACTATED RINGERS IV SOLN: INTRAVENOUS | Status: DC

## 2023-04-08 MED ORDER — PROPOFOL 10 MG/ML IV BOLUS
INTRAVENOUS | Status: DC | PRN
  Administered 2023-04-08: 30 mg via INTRAVENOUS
  Administered 2023-04-08: 20 mg via INTRAVENOUS
  Administered 2023-04-08: 10 mg via INTRAVENOUS
  Administered 2023-04-08: 50 mg via INTRAVENOUS

## 2023-04-08 MED ORDER — PROPOFOL 500 MG/50ML IV EMUL
INTRAVENOUS | Status: DC | PRN
  Administered 2023-04-08: 100 ug/kg/min via INTRAVENOUS

## 2023-04-08 MED ORDER — PROPOFOL 500 MG/50ML IV EMUL
INTRAVENOUS | Status: AC
  Filled 2023-04-08: qty 50

## 2023-04-08 MED ORDER — SODIUM CHLORIDE 0.9 % IV SOLN: INTRAVENOUS | Status: DC

## 2023-04-08 NOTE — H&P (Signed)
CC: Here today for flexible sigmoidoscopy  HPI: Jenny Glass is an 72 y.o. female with history of HTN, HLD, C diff, PMR (on 2 mg/day of prednisone) whom is seen in the office previously as a referral by Dr. Nicholos Johns for evaluation of complicated diverticulitis.   Colonoscopy 04/07/12 - Dr. Loreta Ave -small internal hemorrhoids, extensive sigmoid diverticulosis. Otherwise normal.  CT A/P 07/11/22 1. Acute sigmoid colon diverticulitis. Small peridiverticular abscess/fluid collection, 1.9 cm in greatest dimension. No other complicating features. 2. No other acute abnormality within the abdomen or pelvis.   CT A/P 07/15/22 No significant change in moderate sigmoid diverticulitis.  Slight increase in size of 2.6 cm diverticular abscess in central sigmoid mesentery.  Increased mild bilateral hydroureteronephrosis due to involvement of both ureters by diverticulitis.  Stable moderate hiatal hernia.  CT A/P 07/23/22 Imaging improvement. Less inflammatory change of the sigmoid colon itself. Less edema in the adjacent fat. Decreasing size of the small fluid collection in the sigmoid mesentery, 1 x 1.5 cm today compared with 1.5 x 2.5 cm previously.  CT A/P 08/15/22 1. Complicated sigmoid Diverticulitis with unresolved sigmoid inflammation (perhaps mildly improved since 07/23/2022). Secondary involvement of and architectural distortion on both the bladder fundus and dorsal uterus, but no evidence of fistula. No abscess or drainable fluid now. Trace pelvic free fluid is likely reactive.  2. No bowel obstruction or new abnormality in the abdomen or pelvis. Small to moderate gastric hiatal hernia. Chronic right renal cortical scarring and atrophy. Mild calcified aortic atherosclerosis.  Referred to see me. Since being discharged 07/27/2022, she reports she has been doing better than when she was in the hospital. Aside from her low-dose prednisone, she has been off all other treatment for  her PMR. She denies any abdominal pain at present. No nausea or vomiting. No fever or chills. She was prescribed a prolonged course of antibiotics that she is almost completed. Having regular bowel movements. The diarrhea that she experienced while admitted has improved/resolved. No blood in her stool.  Cscope 09/17/22 -  - One 2 mm polyp in the cecum, removed with a cold biopsy forceps. Resected and retrieved. - Three 3 to 4 mm polyps in the transverse colon and in the ascending colon, removed with a cold snare. Resected and retrieved. - Diverticulosis with narrowing in the sigmoid colon and in the descending colon. - Non-bleeding internal hemorrhoids. - ADDENDUM: Due to patient discomfort after the procedure, patient was brought back to the endoscopy suite and air was suctioned from the right side of the colon.  OR-10/12/2022: Robotic assisted low anterior resection with end colostomy Robotic assisted left salpingo-oophorectomy Drainage of intra-abdominal abscess 4 x 4 cm Bilateral transversus abdominus plane (TAP) blocks  FINDINGS: Significant inflammatory adhesions about her sigmoid colon related to her likely smoldering diverticulitis. Intra-abdominal abscess was also encountered and drained containing approximately 15 cc of milky Jenny Glass pus. This was in the vicinity of her pelvic sigmoid. Densely involving her left fallopian tube and ovary which were unable to be separated and therefore were removed en bloc. Inflammation went all way down to her rectum down to her pelvic floor/peritoneal reflection and posterior uterus. Given all the inflammatory type changes as well as the rectal involvement all way down to her mid rectum, it was felt that Hartman's type resection would be necessary as opposed to any sort of primary anastomosis with/without diverting ileostomy. Proximal sigmoid colostomy fashion in the left lower quadrant and her previously marked ostomy marking site.   FINDINGS  A. COLON,  RECTOSIGMOID, RESECTION:  - Diverticulosis with focal diverticular chronic inflammation and  fibrosis.  - Tubular adenoma, negative for high-grade dysplasia (0.3 cm in  greatest dimension).  - Benign regional lymph nodes (2).  - No malignancy identified.   Specimen: Received fresh is "rectosigmoid colon stitch is proximal" Length: 16 cm in length by 1.9 cm in diameter Serosa: Pink-tan with a minimal amount of gray adhesion, but otherwise intact. Contents: A minimal amount of fecal material Mucosa/Wall: The mucosa is tan and glistening with a wall thickness of 0.6 cm. Multiple diverticula are identified ranging from 0.2 to 0.8 cm. No further distinct lesions are grossly identified. Lymph nodes: The pericolic fat is palpated and 2 lymph node candidates measuring 0.3 and 0.4 cm are identified and sampled. Block Summary: A1 proximal margin A2 distal margin A3-A4 representative diverticula A5 two lymph node candidates, whole  She was admitted postoperatively and recovered well. She underwent ostomy care teaching with our wound ostomy team. On 10/17/2022, she was comfortable with and stable for discharge home. She returns today for follow-up. They have been doing decently well all things considered. Has had home health services but does not feel like she needs an. She reports they are doing much aside from checking her vital signs. They have not been providing them any meaningful assistance with ostomy care. They have yet to see the wound ostomy clinic as an outpatient.  She denies any changes in health or health history since we met in the office. No new medications/allergies. She states she is ready for planned scope today.   PMH: HTN, HLD, C diff, PMR (on 2 mg/day of prednisone)  PSH: BTL; denies any other abdominal or pelvic surgical history  FHx: Denies any known family history of colorectal, breast, endometrial or ovarian cancer  Social Hx: Denies use of tobacco/EtOH/illicit drug.  She is here today with her husband    Past Medical History:  Diagnosis Date   Arthritis    Carotid artery stenosis    Chronic back pain    Diverticulitis    Fibromyalgia    Hyperlipidemia    Hypertension    no meds   Numbness and tingling    hands bilat and lower legs bilat comes and goes    Pneumonia    Rheumatoid arthritis (HCC)    polymyalgia rheumatica   UTI (lower urinary tract infection)     Past Surgical History:  Procedure Laterality Date   COLON SURGERY     COLOSTOMY Left    LLQ   SHOULDER OPEN ROTATOR CUFF REPAIR Left 11/10/2015   Procedure: LEFT SHOULDER MINI OPEN ROTATOR CUFF REPAIR AND DISTAL CLAVICAL RESECTION;  Surgeon: Jene Every, MD;  Location: [facility];  Service: Orthopedics;  Laterality: Left;   XI ROBOTIC ASSISTED LOWER ANTERIOR RESECTION N/A 10/12/2022   Procedure: ROBOTIC SIGMOIDECTOMY WITH LOW ANTERIOR RESECTION, OSTOMY CREATION;  Surgeon: Andria Meuse, MD;  Location: [facility];  Service: General;  Laterality: N/A;    Family History  Problem Relation Age of Onset   Cancer Mother    Cancer Father    Colon cancer Neg Hx    Esophageal cancer Neg Hx    Rectal cancer Neg Hx    Stomach cancer Neg Hx     Social:  reports that she has never smoked. She has never used smokeless tobacco. She reports that she does not drink alcohol and does not use drugs.  Allergies: No Known Allergies  Medications: I have reviewed the patient's  current medications.  No results found for this or any previous visit (from the past 48 hour(s)).  No results found.   PE Blood pressure (!) 166/73, pulse 63, temperature 98 F (36.7 C), temperature source Tympanic, resp. rate 16, height 5\' 3"  (1.6 m), weight 70.3 kg, SpO2 99 %. Constitutional: NAD; conversant Eyes: Moist conjunctiva; no lid lag; anicteric Lungs: Normal respiratory effort CV: RRR GI: Abd soft, NT/ND Psychiatric: Appropriate affect  No results found for this or any previous visit (from the past  48 hour(s)).  No results found.  A/P: Jenny Glass is an 72 y.o. female with hx of HTN, HLD, C diff, PMR (on 2 mg/day of prednisone) here for flexible sigmoidoscopy - hx of recent complicated diverticulitis; now s/p robotic assisted Hartmann's + Left salpingo-oophorectomy.  GGE 03/25/23 - clear  -Will plan flexible sigmoidoscopy today to assess remnant rectum and tissue quality for preop planning purposes. -The planned procedure, material risks (inclduing but not limited to bleeding, pain, infection, perforation of rectum, damage to surrounding structures, need for additional procedures, heart attack, pneumonia) benefits and alternatives have been reviewed. Remains motivated to pursue colostomy reversal if candidate. -All her questions were answered to her satisfaction, she expressed understanding and has agreed to proceed  Marin Olp, MD [facility], [facility]

## 2023-04-08 NOTE — Discharge Instructions (Addendum)
POST OP INSTRUCTIONS  DIET: As tolerated. Follow a light bland diet the first 24 hours after arrival home, such as soup, liquids, crackers, etc, advancing to solids as tolerated.    Take your usually prescribed home medications unless otherwise directed.   ACTIVITIES as tolerated:   You may resume regular (light) daily activities as you feel able throughout the day or into tomorrow. Do not drive a car or operate machinery for the next 24 hours.   FOLLOW UP in our office  When to call us [phone]: Poor pain control Reactions / problems with new medications (rash/itching, etc)  Fever over 101.5 F (38.5 C) Inability to urinate Nausea/vomiting Worsening swelling or bruising Continued bleeding from incision. Increased pain, redness, or drainage from the incision  The clinic staff is available to answer your questions during regular business hours (8:30am-5pm).  Please don't hesitate to call and ask to speak to one of our nurses for clinical concerns.   A surgeon from [facility] is always on call at the hospitals   If you have a medical emergency, go to the nearest emergency room or call 911.  [facility] [address], [place], [place]  [address] MAIN: [phone] FAX: [phone] [email or web address]

## 2023-04-08 NOTE — Anesthesia Procedure Notes (Signed)
Procedure Name: MAC Date/Time: 04/08/2023 11:40 AM  Performed by: Ludwig Lean, CRNAPre-anesthesia Checklist: Patient identified, Emergency Drugs available, Suction available and Patient being monitored Patient Re-evaluated:Patient Re-evaluated prior to induction Oxygen Delivery Method: Simple face mask Placement Confirmation: positive ETCO2 and breath sounds checked- equal and bilateral

## 2023-04-08 NOTE — Op Note (Signed)
[facility] Patient Name: Jenny Glass Procedure Date: 04/08/2023 MRN: [id number] Attending MD: Andria Meuse MD, MD,  Date of Birth: [birth year] CSN: [id number] Age: 72 Admit Type: Inpatient Procedure:                Flexible Sigmoidoscopy Indications:              Preoperative assessment Providers:                Stephanie Coup. Ibtisam Benge MD, MD, Martha Clan, RN,                            Marge Duncans, RN, Sunday Corn Mbumina, Technician Referring MD:              Medicines:                Monitored Anesthesia Care Complications:            No immediate complications. Estimated Blood Loss:     Estimated blood loss: none. Procedure:                Pre-Anesthesia Assessment:                           - Prior to the procedure, a History and Physical                            was performed, and patient medications, allergies                            and sensitivities were reviewed. The patient's                            tolerance of previous anesthesia was reviewed.                           - Patient identification and proposed procedure                            were verified prior to the procedure by the                            physician, the nurse and the anesthetist. The                            procedure was verified in the pre-procedure area in                            the endoscopy suite.                           After obtaining informed consent, the scope was                            passed under direct vision. The CF-[id number] ([id number])                            Olympus colonoscope was introduced through  the anus                            and advanced to the the sigmoid colon. The flexible                            sigmoidoscopy was accomplished without difficulty.                            The patient tolerated the procedure well. The                            quality of the bowel preparation was adequate. Scope In: 11:46:45 AM Scope  Out: 11:56:49 AM Total Procedure Duration: 0 hours 10 minutes 4 seconds  Findings:      The perianal and digital rectal examinations were normal. Pertinent       negatives include normal sphincter tone and no palpable rectal lesions.      There was evidence of an end staple line in the rectosigmoid colon with       small amount of retained sigmoid with foreshortened rectum. Entire       legnth of Hartmann stump is 14 cm. Exudate on remnant sigmiod (see       photos). Health appearing rectum, Estimated blood loss: none. Impression:               - Few cm of evident sigmoid with some                            diverticulosis. Intact staple line. Health                            appearing foreshortened rectum. Entire Hartmann                            stump is 14 cm long.                           - No specimens collected. Moderate Sedation:      Not Applicable - Patient had care per Anesthesia. Recommendation:           - Continue present medications. Procedure Code(s):        --- Professional ---                           [phone], Sigmoidoscopy, flexible; diagnostic,                            including collection of specimen(s) by brushing or                            washing, when performed (separate procedure) Diagnosis Code(s):        --- Professional ---                           N56.213, Encounter for other preprocedural  examination                           Z98.0, Intestinal bypass and anastomosis status CPT copyright 2022 American Medical Association. All rights reserved. The codes documented in this report are preliminary and upon coder review may  be revised to meet current compliance requirements. Marin Olp, MD Andria Meuse MD, MD 04/08/2023 12:18:56 PM This report has been signed electronically. Number of Addenda: 0

## 2023-04-08 NOTE — Anesthesia Postprocedure Evaluation (Signed)
Anesthesia Post Note  Patient: Jenny Glass  Procedure(s) Performed: FLEXIBLE SIGMOIDOSCOPY     Patient location during evaluation: PACU Anesthesia Type: MAC Level of consciousness: awake Pain management: pain level controlled Vital Signs Assessment: post-procedure vital signs reviewed and stable Respiratory status: spontaneous breathing, nonlabored ventilation and respiratory function stable Cardiovascular status: stable and blood pressure returned to baseline Postop Assessment: no apparent nausea or vomiting Anesthetic complications: no   No notable events documented.  Last Vitals:  Vitals:   04/08/23 1202 04/08/23 1210  BP: 118/61   Pulse: 65 63  Resp: 17 12  Temp: (!) 36.2 C   SpO2: 100% 100%    Last Pain:  Vitals:   04/08/23 1210  TempSrc:   PainSc: 0-No pain                 Linton Rump

## 2023-04-08 NOTE — Transfer of Care (Signed)
Immediate Anesthesia Transfer of Care Note  Patient: SHELVA REAM  Procedure(s) Performed: Procedure(s): FLEXIBLE SIGMOIDOSCOPY (N/A)  Patient Location: PACU  Anesthesia Type:MAC  Level of Consciousness: Patient easily awoken, sedated, comfortable, cooperative, following commands, responds to stimulation.   Airway & Oxygen Therapy: Patient spontaneously breathing, ventilating well, oxygen via simple oxygen mask.  Post-op Assessment: Report given to PACU RN, vital signs reviewed and stable, moving all extremities.   Post vital signs: Reviewed and stable.  Complications: No apparent anesthesia complications  Vitals Value Taken Time  BP 118/61 04/08/23 1202  Temp 36.2 C 04/08/23 1202  Pulse 62 04/08/23 1203  Resp 15 04/08/23 1203  SpO2 100 % 04/08/23 1203  Vitals shown include unvalidated device data.  Last Pain:  Vitals:   04/08/23 1202  TempSrc: Temporal  PainSc: 0-No pain         Complications: No notable events documented.

## 2023-04-11 ENCOUNTER — Encounter (HOSPITAL_COMMUNITY): Payer: Self-pay | Admitting: Surgery

## 2023-04-18 DIAGNOSIS — M0609 Rheumatoid arthritis without rheumatoid factor, multiple sites: Secondary | ICD-10-CM | POA: Diagnosis not present

## 2023-04-18 DIAGNOSIS — Z79899 Other long term (current) drug therapy: Secondary | ICD-10-CM | POA: Diagnosis not present

## 2023-06-04 DIAGNOSIS — M48061 Spinal stenosis, lumbar region without neurogenic claudication: Secondary | ICD-10-CM | POA: Diagnosis not present

## 2023-06-04 DIAGNOSIS — Z6827 Body mass index (BMI) 27.0-27.9, adult: Secondary | ICD-10-CM | POA: Diagnosis not present

## 2023-06-04 DIAGNOSIS — M1991 Primary osteoarthritis, unspecified site: Secondary | ICD-10-CM | POA: Diagnosis not present

## 2023-06-04 DIAGNOSIS — E663 Overweight: Secondary | ICD-10-CM | POA: Diagnosis not present

## 2023-06-04 DIAGNOSIS — Z79899 Other long term (current) drug therapy: Secondary | ICD-10-CM | POA: Diagnosis not present

## 2023-06-04 DIAGNOSIS — M0609 Rheumatoid arthritis without rheumatoid factor, multiple sites: Secondary | ICD-10-CM | POA: Diagnosis not present

## 2023-06-13 DIAGNOSIS — M0609 Rheumatoid arthritis without rheumatoid factor, multiple sites: Secondary | ICD-10-CM | POA: Diagnosis not present

## 2023-06-19 DIAGNOSIS — K5720 Diverticulitis of large intestine with perforation and abscess without bleeding: Secondary | ICD-10-CM | POA: Diagnosis not present

## 2023-06-19 DIAGNOSIS — Z933 Colostomy status: Secondary | ICD-10-CM | POA: Diagnosis not present

## 2023-06-25 ENCOUNTER — Other Ambulatory Visit: Payer: Self-pay | Admitting: Urology

## 2023-07-04 DIAGNOSIS — Z23 Encounter for immunization: Secondary | ICD-10-CM | POA: Diagnosis not present

## 2023-07-05 NOTE — Progress Notes (Signed)
Sent message, via epic in basket, requesting orders in epic from surgeon.  

## 2023-07-09 ENCOUNTER — Ambulatory Visit: Payer: Self-pay | Admitting: Surgery

## 2023-07-09 DIAGNOSIS — Z01818 Encounter for other preprocedural examination: Secondary | ICD-10-CM

## 2023-07-09 NOTE — Patient Instructions (Signed)
DUE TO COVID-19 ONLY TWO VISITORS  (aged 71 and older)  ARE ALLOWED TO COME WITH YOU AND STAY IN THE WAITING ROOM ONLY DURING PRE OP AND PROCEDURE.   **NO VISITORS ARE ALLOWED IN THE SHORT STAY AREA OR RECOVERY ROOM!!**  IF YOU WILL BE ADMITTED INTO THE HOSPITAL YOU ARE ALLOWED ONLY FOUR SUPPORT PEOPLE DURING VISITATION HOURS ONLY (7 AM -8PM)   The support person(s) must pass our screening, gel in and out, and wear a mask at all times, including in the patient's room. Patients must also wear a mask when staff or their support person are in the room. Visitors GUEST BADGE MUST BE WORN VISIBLY  One adult visitor may remain with you overnight and MUST be in the room by 8 P.M.     Your procedure is scheduled on: 07/19/23   Report to [facility] Main Entrance    Report to admitting at : 10:45 AM   Call this number if you have problems the morning of surgery [phone]   Clear liquids starting the day before surgery until : 10:00 AM DAY OF SURGERY  Water Black Coffee (sugar ok, NO MILK/CREAM OR CREAMERS)  Tea (sugar ok, NO MILK/CREAM OR CREAMERS) regular and decaf                             Plain Jell-O (NO RED)                                           Fruit ices (not with fruit pulp, NO RED)                                     Popsicles (NO RED)                                                                  Juice: apple, WHITE grape, WHITE cranberry Sports drinks like Gatorade (NO RED)              Drink 2 Ensure/G2 drinks AT 10:00 PM the night before surgery.    The day of surgery:  Drink ONE (1) Pre-Surgery Clear Ensure at : 10:00 AM the morning of surgery. Drink in one sitting. Do not sip.  This drink was given to you during your hospital  pre-op appointment visit. Nothing else to drink after completing the  Pre-Surgery Clear Ensure or G2.          If you have questions, please contact your surgeon's office.  FOLLOW BOWEL PREP AND ANY ADDITIONAL PRE OP INSTRUCTIONS  YOU RECEIVED FROM YOUR SURGEON'S OFFICE!!!   Oral Hygiene is also important to reduce your risk of infection.                                    Remember - BRUSH YOUR TEETH THE MORNING OF SURGERY WITH YOUR REGULAR TOOTHPASTE  DENTURES WILL BE REMOVED PRIOR TO SURGERY PLEASE DO NOT APPLY "Poly  grip" OR ADHESIVES!!!   Do NOT smoke after Midnight   Take these medicines the morning of surgery with A SIP OF WATER: prednisone.  DO NOT TAKE ANY ORAL DIABETIC MEDICATIONS DAY OF YOUR SURGERY                              You may not have any metal on your body including hair pins, jewelry, and body piercing             Do not wear make-up, lotions, powders, perfumes/cologne, or deodorant  Do not wear nail polish including gel and S&S, artificial/acrylic nails, or any other type of covering on natural nails including finger and toenails. If you have artificial nails, gel coating, etc. that needs to be removed by a nail salon please have this removed prior to surgery or surgery may need to be canceled/ delayed if the surgeon/ anesthesia feels like they are unable to be safely monitored.   Do not shave  48 hours prior to surgery.    Do not bring valuables to the hospital. [facility] IS NOT             RESPONSIBLE   FOR VALUABLES.   Contacts, glasses, or bridgework may not be worn into surgery.   Bring small overnight bag day of surgery.   DO NOT BRING YOUR HOME MEDICATIONS TO THE HOSPITAL. PHARMACY WILL DISPENSE MEDICATIONS LISTED ON YOUR MEDICATION LIST TO YOU DURING YOUR ADMISSION IN THE HOSPITAL!    Patients discharged on the day of surgery will not be allowed to drive home.  Someone NEEDS to stay with you for the first 24 hours after anesthesia.   Special Instructions: Bring a copy of your healthcare power of attorney and living will documents         the day of surgery if you haven't scanned them before.              Please read over the following fact sheets you were given: IF YOU HAVE  QUESTIONS ABOUT YOUR PRE-OP INSTRUCTIONS PLEASE CALL [phone]    [facility] - Preparing for Surgery Before surgery, you can play an important role.  Because skin is not sterile, your skin needs to be as free of germs as possible.  You can reduce the number of germs on your skin by washing with CHG (chlorahexidine gluconate) soap before surgery.  CHG is an antiseptic cleaner which kills germs and bonds with the skin to continue killing germs even after washing. Please DO NOT use if you have an allergy to CHG or antibacterial soaps.  If your skin becomes reddened/irritated stop using the CHG and inform your nurse when you arrive at Short Stay. Do not shave (including legs and underarms) for at least 48 hours prior to the first CHG shower.  You may shave your face/neck. Please follow these instructions carefully:  1.  Shower with CHG Soap the night before surgery and the  morning of Surgery.  2.  If you choose to wash your hair, wash your hair first as usual with your  normal  shampoo.  3.  After you shampoo, rinse your hair and body thoroughly to remove the  shampoo.                           4.  Use CHG as you would any other liquid soap.  You can apply  chg directly  to the skin and wash                       Gently with a scrungie or clean washcloth.  5.  Apply the CHG Soap to your body ONLY FROM THE NECK DOWN.   Do not use on face/ open                           Wound or open sores. Avoid contact with eyes, ears mouth and genitals (private parts).                       Wash face,  Genitals (private parts) with your normal soap.             6.  Wash thoroughly, paying special attention to the area where your surgery  will be performed.  7.  Thoroughly rinse your body with warm water from the neck down.  8.  DO NOT shower/wash with your normal soap after using and rinsing off  the CHG Soap.                9.  Pat yourself dry with a clean towel.            10.  Wear clean pajamas.             11.  Place clean sheets on your bed the night of your first shower and do not  sleep with pets. Day of Surgery : Do not apply any lotions/deodorants the morning of surgery.  Please wear clean clothes to the hospital/surgery center.  FAILURE TO FOLLOW THESE INSTRUCTIONS MAY RESULT IN THE CANCELLATION OF YOUR SURGERY PATIENT SIGNATURE_________________________________  NURSE SIGNATURE__________________________________  ________________________________________________________________________  Jenny Glass  An incentive spirometer is a tool that can help keep your lungs clear and active. This tool measures how well you are filling your lungs with each breath. Taking long deep breaths may help reverse or decrease the chance of developing breathing (pulmonary) problems (especially infection) following: A long period of time when you are unable to move or be active. BEFORE THE PROCEDURE  If the spirometer includes an indicator to show your best effort, your nurse or respiratory therapist will set it to a desired goal. If possible, sit up straight or lean slightly forward. Try not to slouch. Hold the incentive spirometer in an upright position. INSTRUCTIONS FOR USE  Sit on the edge of your bed if possible, or sit up as far as you can in bed or on a chair. Hold the incentive spirometer in an upright position. Breathe out normally. Place the mouthpiece in your mouth and seal your lips tightly around it. Breathe in slowly and as deeply as possible, raising the piston or the ball toward the top of the column. Hold your breath for 3-5 seconds or for as long as possible. Allow the piston or ball to fall to the bottom of the column. Remove the mouthpiece from your mouth and breathe out normally. Rest for a few seconds and repeat Steps 1 through 7 at least 10 times every 1-2 hours when you are awake. Take your time and take a few normal breaths between deep breaths. The spirometer may include an  indicator to show your best effort. Use the indicator as a goal to work toward during each repetition. After each set of 10 deep breaths, practice coughing to be sure your  lungs are clear. If you have an incision (the cut made at the time of surgery), support your incision when coughing by placing a pillow or rolled up towels firmly against it. Once you are able to get out of bed, walk around indoors and cough well. You may stop using the incentive spirometer when instructed by your caregiver.  RISKS AND COMPLICATIONS Take your time so you do not get dizzy or light-headed. If you are in pain, you may need to take or ask for pain medication before doing incentive spirometry. It is harder to take a deep breath if you are having pain. AFTER USE Rest and breathe slowly and easily. It can be helpful to keep track of a log of your progress. Your caregiver can provide you with a simple table to help with this. If you are using the spirometer at home, follow these instructions: SEEK MEDICAL CARE IF:  You are having difficultly using the spirometer. You have trouble using the spirometer as often as instructed. Your pain medication is not giving enough relief while using the spirometer. You develop fever of 100.5 F (38.1 C) or higher. SEEK IMMEDIATE MEDICAL CARE IF:  You cough up bloody sputum that had not been present before. You develop fever of 102 F (38.9 C) or greater. You develop worsening pain at or near the incision site. MAKE SURE YOU:  Understand these instructions. Will watch your condition. Will get help right away if you are not doing well or get worse. Document Released: 03/04/2007 Document Revised: 01/14/2012 Document Reviewed: 05/05/2007 Exit[employer] Patient Information 2014 ExitCare, [place].   ________________________________________________________________________ WHAT IS A BLOOD TRANSFUSION? Blood Transfusion Information  A transfusion is the replacement of blood or some of its  parts. Blood is made up of multiple cells which provide different functions. Red blood cells carry oxygen and are used for blood loss replacement. White blood cells fight against infection. Platelets control bleeding. Plasma helps clot blood. Other blood products are available for specialized needs, such as hemophilia or other clotting disorders. BEFORE THE TRANSFUSION  Who gives blood for transfusions?  Healthy volunteers who are fully evaluated to make sure their blood is safe. This is blood bank blood. Transfusion therapy is the safest it has ever been in the practice of medicine. Before blood is taken from a donor, a complete history is taken to make sure that person has no history of diseases nor engages in risky social behavior (examples are intravenous drug use or sexual activity with multiple partners). The donor's travel history is screened to minimize risk of transmitting infections, such as malaria. The donated blood is tested for signs of infectious diseases, such as HIV and hepatitis. The blood is then tested to be sure it is compatible with you in order to minimize the chance of a transfusion reaction. If you or a relative donates blood, this is often done in anticipation of surgery and is not appropriate for emergency situations. It takes many days to process the donated blood. RISKS AND COMPLICATIONS Although transfusion therapy is very safe and saves many lives, the main dangers of transfusion include:  Getting an infectious disease. Developing a transfusion reaction. This is an allergic reaction to something in the blood you were given. Every precaution is taken to prevent this. The decision to have a blood transfusion has been considered carefully by your caregiver before blood is given. Blood is not given unless the benefits outweigh the risks. AFTER THE TRANSFUSION Right after receiving a blood transfusion, you will  usually feel much better and more energetic. This is especially  true if your red blood cells have gotten low (anemic). The transfusion raises the level of the red blood cells which carry oxygen, and this usually causes an energy increase. The nurse administering the transfusion will monitor you carefully for complications. HOME CARE INSTRUCTIONS  No special instructions are needed after a transfusion. You may find your energy is better. Speak with your caregiver about any limitations on activity for underlying diseases you may have. SEEK MEDICAL CARE IF:  Your condition is not improving after your transfusion. You develop redness or irritation at the intravenous (IV) site. SEEK IMMEDIATE MEDICAL CARE IF:  Any of the following symptoms occur over the next 12 hours: Shaking chills. You have a temperature by mouth above 102 F (38.9 C), not controlled by medicine. Chest, back, or muscle pain. People around you feel you are not acting correctly or are confused. Shortness of breath or difficulty breathing. Dizziness and fainting. You get a rash or develop hives. You have a decrease in urine output. Your urine turns a dark color or changes to pink, red, or brown. Any of the following symptoms occur over the next 10 days: You have a temperature by mouth above 102 F (38.9 C), not controlled by medicine. Shortness of breath. Weakness after normal activity. The white part of the eye turns yellow (jaundice). You have a decrease in the amount of urine or are urinating less often. Your urine turns a dark color or changes to pink, red, or brown. Document Released: 10/19/2000 Document Revised: 01/14/2012 Document Reviewed: 06/07/2008 [facility] Patient Information 2014 [place], [place].  _______________________________________________________________________

## 2023-07-10 ENCOUNTER — Encounter (HOSPITAL_COMMUNITY)
Admission: RE | Admit: 2023-07-10 | Discharge: 2023-07-10 | Disposition: A | Discharge status: Home / Self Care | Payer: Medicare Other | Source: Ambulatory Visit | Attending: Surgery | Admitting: Surgery

## 2023-07-10 ENCOUNTER — Other Ambulatory Visit: Payer: Self-pay

## 2023-07-10 ENCOUNTER — Encounter (HOSPITAL_COMMUNITY): Payer: Self-pay

## 2023-07-10 VITALS — BP 153/84 | HR 65 | Temp 97.4°F | Ht 63.0 in | Wt 153.0 lb

## 2023-07-10 DIAGNOSIS — Z01812 Encounter for preprocedural laboratory examination: Secondary | ICD-10-CM | POA: Insufficient documentation

## 2023-07-10 DIAGNOSIS — R9431 Abnormal electrocardiogram [ECG] [EKG]: Secondary | ICD-10-CM | POA: Diagnosis not present

## 2023-07-10 DIAGNOSIS — Z9889 Other specified postprocedural states: Secondary | ICD-10-CM | POA: Diagnosis not present

## 2023-07-10 DIAGNOSIS — Z933 Colostomy status: Secondary | ICD-10-CM | POA: Diagnosis not present

## 2023-07-10 DIAGNOSIS — Z01810 Encounter for preprocedural cardiovascular examination: Secondary | ICD-10-CM | POA: Insufficient documentation

## 2023-07-10 DIAGNOSIS — I10 Essential (primary) hypertension: Secondary | ICD-10-CM | POA: Diagnosis not present

## 2023-07-10 DIAGNOSIS — Z01818 Encounter for other preprocedural examination: Secondary | ICD-10-CM

## 2023-07-10 LAB — CBC WITH DIFFERENTIAL/PLATELET
Abs Immature Granulocytes: 0.02 10*3/uL (ref 0.00–0.07)
Basophils Absolute: 0.1 10*3/uL (ref 0.0–0.1)
Basophils Relative: 1 %
Eosinophils Absolute: 0.3 10*3/uL (ref 0.0–0.5)
Eosinophils Relative: 4 %
HCT: 38.6 % (ref 36.0–46.0)
Hemoglobin: 12.2 g/dL (ref 12.0–15.0)
Immature Granulocytes: 0 %
Lymphocytes Relative: 18 %
Lymphs Abs: 1.6 10*3/uL (ref 0.7–4.0)
MCH: 28.8 pg (ref 26.0–34.0)
MCHC: 31.6 g/dL (ref 30.0–36.0)
MCV: 91.3 fL (ref 80.0–100.0)
Monocytes Absolute: 0.7 10*3/uL (ref 0.1–1.0)
Monocytes Relative: 8 %
Neutro Abs: 6 10*3/uL (ref 1.7–7.7)
Neutrophils Relative %: 69 %
Platelets: 235 10*3/uL (ref 150–400)
RBC: 4.23 MIL/uL (ref 3.87–5.11)
RDW: 15.3 % (ref 11.5–15.5)
WBC: 8.6 10*3/uL (ref 4.0–10.5)
nRBC: 0 % (ref 0.0–0.2)

## 2023-07-10 LAB — COMPREHENSIVE METABOLIC PANEL
ALT: 12 U/L (ref 0–44)
AST: 12 U/L — ABNORMAL LOW (ref 15–41)
Albumin: 4 g/dL (ref 3.5–5.0)
Alkaline Phosphatase: 65 U/L (ref 38–126)
Anion gap: 9 (ref 5–15)
BUN: 14 mg/dL (ref 8–23)
CO2: 25 mmol/L (ref 22–32)
Calcium: 9.1 mg/dL (ref 8.9–10.3)
Chloride: 107 mmol/L (ref 98–111)
Creatinine, Ser: 0.93 mg/dL (ref 0.44–1.00)
GFR, Estimated: >60 mL/min (ref >60)
Glucose, Bld: 99 mg/dL (ref 70–99)
Potassium: 4.1 mmol/L (ref 3.5–5.1)
Sodium: 141 mmol/L (ref 135–145)
Total Bilirubin: 0.4 mg/dL (ref 0.3–1.2)
Total Protein: 7.1 g/dL (ref 6.5–8.1)

## 2023-07-10 NOTE — Progress Notes (Addendum)
For Short Stay: COVID SWAB appointment date:  Bowel Prep reminder:   For Anesthesia: PCP - Georgianne Fick, MD  Cardiologist -Odis Hollingshead, Sunit, DO  . LOV:  06/20/22  Chest x-ray -  EKG - 07/10/23 Stress Test - 05/17/22 ECHO - 05/26/22 Cardiac Cath -  Pacemaker/ICD device last checked: Pacemaker orders received: Device Rep notified:  Spinal Cord Stimulator: N/A  Sleep Study - N/A CPAP -   Fasting Blood Sugar - N/A Checks Blood Sugar _____ times a day Date and result of last Hgb A1c-  Last dose of GLP1 agonist- N/A GLP1 instructions:   Last dose of SGLT-2 inhibitors- N/A SGLT-2 instructions:   Blood Thinner Instructions:N/A Aspirin Instructions: Last Dose:  Activity level: Can go up a flight of stairs and activities of daily living without stopping and without chest pain and/or shortness of breath   Able to exercise without chest pain and/or shortness of breath  Anesthesia review: Hx: HTN,Carotic Aorta stenosis.  Patient denies shortness of breath, fever, cough and chest pain at PAT appointment   Patient verbalized understanding of instructions that were given to them at the PAT appointment. Patient was also instructed that they will need to review over the PAT instructions again at home before surgery.

## 2023-07-11 ENCOUNTER — Encounter (HOSPITAL_COMMUNITY): Payer: Self-pay

## 2023-07-11 NOTE — Anesthesia Preprocedure Evaluation (Addendum)
Anesthesia Evaluation  Patient identified by MRN, date of birth, ID band Patient awake    Reviewed: Allergy & Precautions, NPO status , Patient's Chart, lab work & pertinent test results  Airway Mallampati: III  TM Distance: >3 FB Neck ROM: Full   Comment: Previous grade I view with Miller 2, easy mask Dental no notable dental hx. (+) Dental Advisory Given   Pulmonary    Pulmonary exam normal breath sounds clear to auscultation       Cardiovascular hypertension, + Peripheral Vascular Disease  Normal cardiovascular exam(-) dysrhythmias  Rhythm:Regular Rate:Normal  Carotid artery stenosis, HLD  Echocardiogram 05/23/2022:  Normal LV systolic function with visual EF 60-65%. Left ventricle cavity  is normal in size. Normal left ventricular wall thickness. Normal global  wall motion. Normal diastolic filling pattern, normal LAP.  Mild (Grade I) aortic regurgitation.  Mild tricuspid regurgitation. No evidence of pulmonary hypertension. RVSP  measures 32 mmHg.  The aortic root is normal. Proximal ascending aorta upper limit of normal,  37 mm.  No prior study for comparison.   Exercise treadmill stress test 05/17/2022: Exercise treadmill stress test performed using Bruce protocol.  Patient reached 8 METS, and 89% of age predicted maximum heart rate.  Exercise capacity was fair.  No chest pain reported.  Normal heart rate and hemodynamic response. Stress EKG revealed no ischemic changes. Low risk study.    Neuro/Psych  Neuromuscular disease (chronic back pain)    GI/Hepatic Neg liver ROS,,,Colostomy, diverticulitis   Endo/Other  negative endocrine ROS    Renal/GU Lab Results      Component                Value               Date                     K                        4.1                 07/10/2023               CREATININE               0.93                07/10/2023                      Musculoskeletal  (+) Arthritis  , Rheumatoid disorders,  Fibromyalgia -PMR   Abdominal   Peds  Hematology Lab Results      Component                Value               Date                      WBC                      8.6                 07/10/2023                HGB                      12.2  07/10/2023                HCT                      38.6                07/10/2023                MCV                      91.3                07/10/2023                PLT                      235                 07/10/2023              Anesthesia Other Findings   Reproductive/Obstetrics                             Anesthesia Physical Anesthesia Plan  ASA: 3  Anesthesia Plan: General   Post-op Pain Management: Lidocaine infusion*, Ketamine IV* and Tylenol PO (pre-op)*   Induction: Intravenous  PONV Risk Score and Plan: 4 or greater and Ondansetron, Treatment may vary due to age or medical condition and Midazolam  Airway Management Planned: Oral ETT  Additional Equipment: None  Intra-op Plan:   Post-operative Plan: Extubation in OR  Informed Consent: I have reviewed the patients History and Physical, chart, labs and discussed the procedure including the risks, benefits and alternatives for the proposed anesthesia with the patient or authorized representative who has indicated his/her understanding and acceptance.     Dental advisory given  Plan Discussed with: CRNA and Anesthesiologist  Anesthesia Plan Comments: (2 x 18 g Ivs )        Anesthesia Quick Evaluation

## 2023-07-11 NOTE — Progress Notes (Signed)
Case: [id number] Date/Time: 07/19/23 1245   Procedures:      XI ROBOTIC ASSISTED COLOSTOMY TAKEDOWN - 4 HOURS TOTAL INCLUDING [facility]     POSSIBLE LOW ANTERIOR RESECTION     FLEXIBLE SIGMOIDOSCOPY     CYSTOSCOPY with FIREFLY INJECTION   Anesthesia type: General   Pre-op diagnosis: COLOSTOMY STATUS HISTORY OF DIVERTICULITIS   Location: [facility] ROOM 02 / [facility]   Surgeons: Andria Meuse, MD; Crista Elliot, MD       DISCUSSION: Jenny Glass is a 72 yo female who presents to PAT prior to surgery above. PMH significant for HTN, HLD, chronic back pain, carotid artery stenosis, PMR (on chronic steroids), fibromyalgia, diverticulitis s/p robotic assisted Hartmann's + Left salpingo-oophorectomy. Now presenting for ostomy reversal.  Patient was referred to Cardiology in 04/2022 for chest pain and HTN. Her chest pain sounded non-cardiac but she underwent a stress test and echo which were both normal.  VS: BP (!) 153/84   Pulse 65   Temp (!) 36.3 C (Oral)   Ht 5\' 3"  (1.6 m)   Wt 69.4 kg   SpO2 100%   BMI 27.10 kg/m   PROVIDERS: PCP - Georgianne Fick, MD  Cardiologist - Tessa Lerner, DO    LABS: Labs reviewed: Acceptable for surgery. (all labs ordered are listed, but only abnormal results are displayed)  Labs Reviewed  COMPREHENSIVE METABOLIC PANEL - Abnormal; Notable for the following components:      Result Value   AST 12 (*)    All other components within normal limits  CBC WITH DIFFERENTIAL/PLATELET  TYPE AND SCREEN     IMAGES:  CT Abdomen/Pelvis 10/112023:  IMPRESSION: 1. Complicated sigmoid Diverticulitis with unresolved sigmoid inflammation (perhaps mildly improved since 07/23/2022). Secondary involvement of and architectural distortion on both the bladder fundus and dorsal uterus, but no evidence of fistula. No abscess or drainable fluid now. Trace pelvic free fluid is likely reactive.   2. No bowel obstruction or new abnormality in the  abdomen or pelvis. Small to moderate gastric hiatal hernia. Chronic right renal cortical scarring and atrophy. Mild calcified aortic atherosclerosis.     EKG 07/10/23  NSR, rate 60 Non specific ST and T wave abnormality No significant change from prior   CV:  Exercise treadmill stress test 05/17/2022:  Exercise treadmill stress test performed using Bruce protocol.  Patient reached 8 METS, and 89% of age predicted maximum heart rate.  Exercise capacity was fair.  No chest pain reported.  Normal heart rate and hemodynamic response. Stress EKG revealed no ischemic changes. Low risk study.  Echo 05/23/2022:  Normal LV systolic function with visual EF 60-65%. Left ventricle cavity  is normal in size. Normal left ventricular wall thickness. Normal global  wall motion. Normal diastolic filling pattern, normal LAP.  Mild (Grade I) aortic regurgitation.  Mild tricuspid regurgitation. No evidence of pulmonary hypertension. RVSP  measures 32 mmHg.  The aortic root is normal. Proximal ascending aorta upper limit of normal,  37 mm.  No prior study for comparison.   Carotid artery duplex 04/19/22:  Duplex suggests stenosis in the right internal carotid artery (1-15%). Duplex suggests stenosis in the left internal carotid artery (50-69%). Antegrade right vertebral artery flow. Antegrade left vertebral artery flow. No significant change since 04/09/2017. Follow up in six months is appropriate if clinically indicated.   Past Medical History:  Diagnosis Date   Arthritis    Carotid artery stenosis    Chronic back pain  Diverticulitis    Fibromyalgia    Hyperlipidemia    Hypertension    no meds   Numbness and tingling    hands bilat and lower legs bilat comes and goes    Pneumonia    Rheumatoid arthritis (HCC)    polymyalgia rheumatica   UTI (lower urinary tract infection)     Past Surgical History:  Procedure Laterality Date   COLON SURGERY     COLOSTOMY Left    LLQ    FLEXIBLE SIGMOIDOSCOPY N/A 04/08/2023   Procedure: FLEXIBLE SIGMOIDOSCOPY;  Surgeon: Andria Meuse, MD;  Location: [facility];  Service: General;  Laterality: N/A;   SHOULDER OPEN ROTATOR CUFF REPAIR Left 11/10/2015   Procedure: LEFT SHOULDER MINI OPEN ROTATOR CUFF REPAIR AND DISTAL CLAVICAL RESECTION;  Surgeon: Jene Every, MD;  Location: [facility];  Service: Orthopedics;  Laterality: Left;   XI ROBOTIC ASSISTED LOWER ANTERIOR RESECTION N/A 10/12/2022   Procedure: ROBOTIC SIGMOIDECTOMY WITH LOW ANTERIOR RESECTION, OSTOMY CREATION;  Surgeon: Andria Meuse, MD;  Location: [facility];  Service: General;  Laterality: N/A;    MEDICATIONS:  Calcium Carb-Cholecalciferol (CALCIUM 600 + D PO)   predniSONE (DELTASONE) 1 MG tablet   PRESCRIPTION MEDICATION   No current facility-administered medications for this encounter.   Marcille Blanco [facility] Short Stay/Anesthesiology [facility] Phone [phone] 07/11/2023 1:38 PM

## 2023-07-18 NOTE — Progress Notes (Signed)
Patient aware to arrive at 0845 for 1100 surgery time.   Patient instructed to drink ensure at [facility]

## 2023-07-19 ENCOUNTER — Other Ambulatory Visit: Payer: Self-pay

## 2023-07-19 ENCOUNTER — Inpatient Hospital Stay (HOSPITAL_COMMUNITY): Payer: Medicare Other | Admitting: Medical

## 2023-07-19 ENCOUNTER — Inpatient Hospital Stay (HOSPITAL_COMMUNITY)
Admission: RE | Admit: 2023-07-19 | Discharge: 2023-07-23 | DRG: 330 | Disposition: A | Discharge status: Home / Self Care | Payer: Medicare Other | Attending: Surgery | Admitting: Surgery

## 2023-07-19 ENCOUNTER — Encounter (HOSPITAL_COMMUNITY): Payer: Self-pay | Admitting: Surgery

## 2023-07-19 ENCOUNTER — Encounter (HOSPITAL_COMMUNITY)
Admission: RE | Disposition: A | Discharge status: Home / Self Care | Payer: Self-pay | Source: Home / Self Care | Attending: Surgery

## 2023-07-19 ENCOUNTER — Inpatient Hospital Stay (HOSPITAL_COMMUNITY): Payer: Medicare Other | Admitting: Anesthesiology

## 2023-07-19 DIAGNOSIS — K5732 Diverticulitis of large intestine without perforation or abscess without bleeding: Secondary | ICD-10-CM | POA: Diagnosis present

## 2023-07-19 DIAGNOSIS — I739 Peripheral vascular disease, unspecified: Secondary | ICD-10-CM | POA: Diagnosis present

## 2023-07-19 DIAGNOSIS — I10 Essential (primary) hypertension: Secondary | ICD-10-CM

## 2023-07-19 DIAGNOSIS — M797 Fibromyalgia: Secondary | ICD-10-CM | POA: Diagnosis present

## 2023-07-19 DIAGNOSIS — K449 Diaphragmatic hernia without obstruction or gangrene: Secondary | ICD-10-CM | POA: Diagnosis present

## 2023-07-19 DIAGNOSIS — E785 Hyperlipidemia, unspecified: Secondary | ICD-10-CM | POA: Diagnosis present

## 2023-07-19 DIAGNOSIS — Z86010 Personal history of colonic polyps: Secondary | ICD-10-CM | POA: Diagnosis not present

## 2023-07-19 DIAGNOSIS — M549 Dorsalgia, unspecified: Secondary | ICD-10-CM | POA: Diagnosis present

## 2023-07-19 DIAGNOSIS — K5792 Diverticulitis of intestine, part unspecified, without perforation or abscess without bleeding: Secondary | ICD-10-CM | POA: Diagnosis not present

## 2023-07-19 DIAGNOSIS — G8929 Other chronic pain: Secondary | ICD-10-CM | POA: Diagnosis present

## 2023-07-19 DIAGNOSIS — M069 Rheumatoid arthritis, unspecified: Secondary | ICD-10-CM

## 2023-07-19 DIAGNOSIS — K648 Other hemorrhoids: Secondary | ICD-10-CM | POA: Diagnosis present

## 2023-07-19 DIAGNOSIS — K624 Stenosis of anus and rectum: Secondary | ICD-10-CM

## 2023-07-19 DIAGNOSIS — N1330 Unspecified hydronephrosis: Secondary | ICD-10-CM | POA: Diagnosis present

## 2023-07-19 DIAGNOSIS — Z933 Colostomy status: Secondary | ICD-10-CM | POA: Diagnosis not present

## 2023-07-19 DIAGNOSIS — Z433 Encounter for attention to colostomy: Secondary | ICD-10-CM | POA: Diagnosis not present

## 2023-07-19 DIAGNOSIS — Z9889 Other specified postprocedural states: Principal | ICD-10-CM

## 2023-07-19 DIAGNOSIS — I700 Atherosclerosis of aorta: Secondary | ICD-10-CM | POA: Diagnosis present

## 2023-07-19 DIAGNOSIS — I6529 Occlusion and stenosis of unspecified carotid artery: Secondary | ICD-10-CM | POA: Diagnosis present

## 2023-07-19 DIAGNOSIS — E876 Hypokalemia: Secondary | ICD-10-CM | POA: Diagnosis not present

## 2023-07-19 HISTORY — PX: FLEXIBLE SIGMOIDOSCOPY: SHX5431

## 2023-07-19 HISTORY — PX: XI ROBOTIC ASSISTED COLOSTOMY TAKEDOWN: SHX6828

## 2023-07-19 LAB — TYPE AND SCREEN
ABO/RH(D): O POS
Antibody Screen: NEGATIVE

## 2023-07-19 SURGERY — CLOSURE, COLOSTOMY, ROBOT-ASSISTED
Anesthesia: General | Site: Rectum

## 2023-07-19 MED ORDER — SODIUM CHLORIDE 0.9 % IV SOLN
2.0000 g | INTRAVENOUS | Status: AC
  Administered 2023-07-19: 2 g via INTRAVENOUS
  Filled 2023-07-19: qty 2

## 2023-07-19 MED ORDER — TRAMADOL HCL 50 MG PO TABS
50.0000 mg | ORAL_TABLET | Freq: Four times a day (QID) | ORAL | 0 refills | Status: AC | PRN
Start: 2023-07-21 — End: 2023-07-26

## 2023-07-19 MED ORDER — BUPIVACAINE LIPOSOME 1.3 % IJ SUSP
INTRAMUSCULAR | Status: AC
  Filled 2023-07-19: qty 20

## 2023-07-19 MED ORDER — LACTATED RINGERS IR SOLN
Status: DC | PRN
Start: 2023-07-19 — End: 2023-07-19
  Administered 2023-07-19: 1000 mL

## 2023-07-19 MED ORDER — LIDOCAINE HCL (CARDIAC) PF 100 MG/5ML IV SOSY
PREFILLED_SYRINGE | INTRAVENOUS | Status: DC | PRN
  Administered 2023-07-19: 60 mg via INTRAVENOUS

## 2023-07-19 MED ORDER — BISACODYL 5 MG PO TBEC: 20.0000 mg | DELAYED_RELEASE_TABLET | Freq: Once | ORAL | Status: DC

## 2023-07-19 MED ORDER — OXYCODONE HCL 5 MG/5ML PO SOLN: 5.0000 mg | Freq: Once | ORAL | Status: DC | PRN

## 2023-07-19 MED ORDER — TRAMADOL HCL 50 MG PO TABS
50.0000 mg | ORAL_TABLET | Freq: Four times a day (QID) | ORAL | Status: DC | PRN
  Administered 2023-07-20 (×3): 50 mg via ORAL
  Filled 2023-07-19 (×3): qty 1

## 2023-07-19 MED ORDER — ACETAMINOPHEN 500 MG PO TABS: 1000.0000 mg | ORAL_TABLET | Freq: Once | ORAL | Status: DC

## 2023-07-19 MED ORDER — LACTATED RINGERS IV SOLN: INTRAVENOUS | Status: DC

## 2023-07-19 MED ORDER — ORAL CARE MOUTH RINSE: 15.0000 mL | Freq: Once | OROMUCOSAL | Status: AC

## 2023-07-19 MED ORDER — HYDROMORPHONE HCL 1 MG/ML IJ SOLN
0.2500 mg | INTRAMUSCULAR | Status: DC | PRN
  Administered 2023-07-19: 0.5 mg via INTRAVENOUS

## 2023-07-19 MED ORDER — ONDANSETRON HCL 4 MG/2ML IJ SOLN
INTRAMUSCULAR | Status: DC | PRN
  Administered 2023-07-19: 4 mg via INTRAVENOUS

## 2023-07-19 MED ORDER — HYDROMORPHONE HCL 1 MG/ML IJ SOLN
INTRAMUSCULAR | Status: AC
  Filled 2023-07-19: qty 1

## 2023-07-19 MED ORDER — HEPARIN SODIUM (PORCINE) 5000 UNIT/ML IJ SOLN
5000.0000 [IU] | Freq: Three times a day (TID) | INTRAMUSCULAR | Status: DC
  Administered 2023-07-19 – 2023-07-22 (×7): 5000 [IU] via SUBCUTANEOUS
  Filled 2023-07-19 (×7): qty 1

## 2023-07-19 MED ORDER — ARTIFICIAL TEARS OPHTHALMIC OINT
TOPICAL_OINTMENT | OPHTHALMIC | Status: AC
  Filled 2023-07-19: qty 3.5

## 2023-07-19 MED ORDER — ONDANSETRON HCL 4 MG PO TABS: 4.0000 mg | ORAL_TABLET | Freq: Four times a day (QID) | ORAL | Status: DC | PRN

## 2023-07-19 MED ORDER — NEOMYCIN SULFATE 500 MG PO TABS: 1000.0000 mg | ORAL_TABLET | ORAL | Status: DC

## 2023-07-19 MED ORDER — IBUPROFEN 200 MG PO TABS
600.0000 mg | ORAL_TABLET | Freq: Four times a day (QID) | ORAL | Status: DC | PRN
  Administered 2023-07-21: 600 mg via ORAL
  Filled 2023-07-19: qty 3

## 2023-07-19 MED ORDER — ESMOLOL HCL 100 MG/10ML IV SOLN
INTRAVENOUS | Status: AC
  Filled 2023-07-19: qty 10

## 2023-07-19 MED ORDER — PROPOFOL 10 MG/ML IV BOLUS
INTRAVENOUS | Status: DC | PRN
  Administered 2023-07-19: 150 mg via INTRAVENOUS

## 2023-07-19 MED ORDER — DIPHENHYDRAMINE HCL 12.5 MG/5ML PO ELIX
12.5000 mg | ORAL_SOLUTION | Freq: Four times a day (QID) | ORAL | Status: DC | PRN
  Administered 2023-07-23: 12.5 mg via ORAL
  Filled 2023-07-19 (×2): qty 5

## 2023-07-19 MED ORDER — ENSURE PRE-SURGERY PO LIQD: 592.0000 mL | Freq: Once | ORAL | Status: DC

## 2023-07-19 MED ORDER — BUPIVACAINE LIPOSOME 1.3 % IJ SUSP
INTRAMUSCULAR | Status: DC | PRN
  Administered 2023-07-19: 20 mL

## 2023-07-19 MED ORDER — ACETAMINOPHEN 10 MG/ML IV SOLN
1000.0000 mg | Freq: Four times a day (QID) | INTRAVENOUS | Status: DC
  Administered 2023-07-19: 1000 mg via INTRAVENOUS
  Filled 2023-07-19 (×2): qty 100

## 2023-07-19 MED ORDER — PHENYLEPHRINE HCL-NACL 20-0.9 MG/250ML-% IV SOLN
INTRAVENOUS | Status: DC | PRN
Start: 2023-07-19 — End: 2023-07-19
  Administered 2023-07-19: 15 ug/min via INTRAVENOUS

## 2023-07-19 MED ORDER — ONDANSETRON HCL 4 MG/2ML IJ SOLN: 4.0000 mg | Freq: Once | INTRAMUSCULAR | Status: DC | PRN

## 2023-07-19 MED ORDER — ROCURONIUM BROMIDE 100 MG/10ML IV SOLN
INTRAVENOUS | Status: DC | PRN
  Administered 2023-07-19 (×2): 50 mg via INTRAVENOUS

## 2023-07-19 MED ORDER — 0.9 % SODIUM CHLORIDE (POUR BTL) OPTIME
TOPICAL | Status: DC | PRN
Start: 2023-07-19 — End: 2023-07-19
  Administered 2023-07-19: 3000 mL

## 2023-07-19 MED ORDER — DIPHENHYDRAMINE HCL 50 MG/ML IJ SOLN: 12.5000 mg | Freq: Four times a day (QID) | INTRAMUSCULAR | Status: DC | PRN

## 2023-07-19 MED ORDER — MIDAZOLAM HCL 5 MG/5ML IJ SOLN
INTRAMUSCULAR | Status: DC | PRN
  Administered 2023-07-19: 2 mg via INTRAVENOUS

## 2023-07-19 MED ORDER — INDOCYANINE GREEN 25 MG IV SOLR
INTRAVENOUS | Status: DC | PRN
Start: 2023-07-19 — End: 2023-07-19
  Administered 2023-07-19: 2.5 mg via INTRAVENOUS

## 2023-07-19 MED ORDER — BUPIVACAINE-EPINEPHRINE 0.25% -1:200000 IJ SOLN
INTRAMUSCULAR | Status: AC
  Filled 2023-07-19: qty 1

## 2023-07-19 MED ORDER — SUGAMMADEX SODIUM 200 MG/2ML IV SOLN
INTRAVENOUS | Status: DC | PRN
Start: 2023-07-19 — End: 2023-07-19
  Administered 2023-07-19: 277.6 mg via INTRAVENOUS

## 2023-07-19 MED ORDER — SIMETHICONE 80 MG PO CHEW
40.0000 mg | CHEWABLE_TABLET | Freq: Four times a day (QID) | ORAL | Status: DC | PRN
  Administered 2023-07-20: 40 mg via ORAL
  Filled 2023-07-19: qty 1

## 2023-07-19 MED ORDER — DEXAMETHASONE SODIUM PHOSPHATE 4 MG/ML IJ SOLN
INTRAMUSCULAR | Status: DC | PRN
Start: 2023-07-19 — End: 2023-07-19
  Administered 2023-07-19: 5 mg via INTRAVENOUS

## 2023-07-19 MED ORDER — MIDAZOLAM HCL 2 MG/2ML IJ SOLN
INTRAMUSCULAR | Status: AC
  Filled 2023-07-19: qty 2

## 2023-07-19 MED ORDER — POLYETHYLENE GLYCOL 3350 17 GM/SCOOP PO POWD: 1.0000 | Freq: Once | ORAL | Status: DC

## 2023-07-19 MED ORDER — HYDROMORPHONE HCL 1 MG/ML IJ SOLN
0.5000 mg | INTRAMUSCULAR | Status: DC | PRN
  Administered 2023-07-20 – 2023-07-22 (×4): 0.5 mg via INTRAVENOUS
  Filled 2023-07-19 (×4): qty 0.5

## 2023-07-19 MED ORDER — ALVIMOPAN 12 MG PO CAPS
12.0000 mg | ORAL_CAPSULE | Freq: Two times a day (BID) | ORAL | Status: DC
  Filled 2023-07-19: qty 1

## 2023-07-19 MED ORDER — OXYCODONE HCL 5 MG PO TABS: 5.0000 mg | ORAL_TABLET | Freq: Once | ORAL | Status: DC | PRN

## 2023-07-19 MED ORDER — HEPARIN SODIUM (PORCINE) 5000 UNIT/ML IJ SOLN
5000.0000 [IU] | Freq: Once | INTRAMUSCULAR | Status: AC
  Administered 2023-07-19: 5000 [IU] via SUBCUTANEOUS
  Filled 2023-07-19: qty 1

## 2023-07-19 MED ORDER — EPHEDRINE SULFATE (PRESSORS) 50 MG/ML IJ SOLN
INTRAMUSCULAR | Status: DC | PRN
Start: 2023-07-19 — End: 2023-07-19
  Administered 2023-07-19: 10 mg via INTRAVENOUS

## 2023-07-19 MED ORDER — ALUM & MAG HYDROXIDE-SIMETH 200-200-20 MG/5ML PO SUSP: 30.0000 mL | Freq: Four times a day (QID) | ORAL | Status: DC | PRN

## 2023-07-19 MED ORDER — FENTANYL CITRATE (PF) 100 MCG/2ML IJ SOLN
INTRAMUSCULAR | Status: DC | PRN
  Administered 2023-07-19 (×4): 50 ug via INTRAVENOUS

## 2023-07-19 MED ORDER — HYDRALAZINE HCL 20 MG/ML IJ SOLN: 10.0000 mg | INTRAMUSCULAR | Status: DC | PRN

## 2023-07-19 MED ORDER — CHLORHEXIDINE GLUCONATE 0.12 % MT SOLN
15.0000 mL | Freq: Once | OROMUCOSAL | Status: AC
  Administered 2023-07-19: 15 mL via OROMUCOSAL

## 2023-07-19 MED ORDER — CHLORHEXIDINE GLUCONATE CLOTH 2 % EX PADS: 6.0000 | MEDICATED_PAD | Freq: Once | CUTANEOUS | Status: DC

## 2023-07-19 MED ORDER — KETAMINE HCL 10 MG/ML IJ SOLN
INTRAMUSCULAR | Status: DC | PRN
Start: 2023-07-19 — End: 2023-07-19
  Administered 2023-07-19 (×2): 10 mg via INTRAVENOUS
  Administered 2023-07-19: 20 mg via INTRAVENOUS

## 2023-07-19 MED ORDER — KETOROLAC TROMETHAMINE 30 MG/ML IJ SOLN
INTRAMUSCULAR | Status: AC
  Filled 2023-07-19: qty 1

## 2023-07-19 MED ORDER — FENTANYL CITRATE (PF) 250 MCG/5ML IJ SOLN
INTRAMUSCULAR | Status: AC
  Filled 2023-07-19: qty 5

## 2023-07-19 MED ORDER — SODIUM CHLORIDE 0.9 % IR SOLN
Status: DC | PRN
Start: 2023-07-19 — End: 2023-07-19
  Administered 2023-07-19: 1000 mL

## 2023-07-19 MED ORDER — ONDANSETRON HCL 4 MG/2ML IJ SOLN
4.0000 mg | Freq: Four times a day (QID) | INTRAMUSCULAR | Status: DC | PRN
  Administered 2023-07-21 – 2023-07-22 (×3): 4 mg via INTRAVENOUS
  Filled 2023-07-19 (×3): qty 2

## 2023-07-19 MED ORDER — STERILE WATER FOR INJECTION IJ SOLN
INTRAMUSCULAR | Status: AC
  Filled 2023-07-19: qty 10

## 2023-07-19 MED ORDER — BUPIVACAINE-EPINEPHRINE (PF) 0.25% -1:200000 IJ SOLN
INTRAMUSCULAR | Status: DC | PRN
  Administered 2023-07-19: 30 mL

## 2023-07-19 MED ORDER — ESMOLOL HCL 100 MG/10ML IV SOLN
INTRAVENOUS | Status: DC | PRN
Start: 2023-07-19 — End: 2023-07-19
  Administered 2023-07-19: 30 mg via INTRAVENOUS

## 2023-07-19 MED ORDER — ENSURE PRE-SURGERY PO LIQD: 296.0000 mL | Freq: Once | ORAL | Status: DC

## 2023-07-19 MED ORDER — STERILE WATER FOR INJECTION IJ SOLN
INTRAMUSCULAR | Status: DC | PRN
  Administered 2023-07-19: 15 mL via INTRAVENOUS

## 2023-07-19 MED ORDER — METRONIDAZOLE 500 MG PO TABS: 1000.0000 mg | ORAL_TABLET | ORAL | Status: DC

## 2023-07-19 MED ORDER — PHENYLEPHRINE HCL (PRESSORS) 10 MG/ML IV SOLN
INTRAVENOUS | Status: DC | PRN
Start: 2023-07-19 — End: 2023-07-19
  Administered 2023-07-19: 80 ug via INTRAVENOUS

## 2023-07-19 MED ORDER — ACETAMINOPHEN 500 MG PO TABS
1000.0000 mg | ORAL_TABLET | Freq: Four times a day (QID) | ORAL | Status: DC
  Administered 2023-07-19 – 2023-07-22 (×13): 1000 mg via ORAL
  Filled 2023-07-19 (×15): qty 2

## 2023-07-19 MED ORDER — LIDOCAINE 2% (20 MG/ML) 5 ML SYRINGE
INTRAMUSCULAR | Status: DC | PRN
Start: 2023-07-19 — End: 2023-07-19
  Administered 2023-07-19: 1.5 mg/kg/h via INTRAVENOUS

## 2023-07-19 MED ORDER — LACTATED RINGERS IV SOLN: INTRAVENOUS | Status: DC | PRN

## 2023-07-19 MED ORDER — ACETAMINOPHEN 500 MG PO TABS
1000.0000 mg | ORAL_TABLET | ORAL | Status: AC
  Administered 2023-07-19: 1000 mg via ORAL
  Filled 2023-07-19: qty 2

## 2023-07-19 MED ORDER — KETOROLAC TROMETHAMINE 30 MG/ML IJ SOLN
15.0000 mg | Freq: Once | INTRAMUSCULAR | Status: AC | PRN
  Administered 2023-07-19: 15 mg via INTRAVENOUS

## 2023-07-19 MED ORDER — ALVIMOPAN 12 MG PO CAPS
12.0000 mg | ORAL_CAPSULE | ORAL | Status: AC
  Administered 2023-07-19: 12 mg via ORAL
  Filled 2023-07-19: qty 1

## 2023-07-19 MED ORDER — ENSURE SURGERY PO LIQD
237.0000 mL | Freq: Two times a day (BID) | ORAL | Status: DC
  Administered 2023-07-20 – 2023-07-22 (×3): 237 mL via ORAL

## 2023-07-19 MED ORDER — BUPIVACAINE LIPOSOME 1.3 % IJ SUSP: 20.0000 mL | Freq: Once | INTRAMUSCULAR | Status: DC

## 2023-07-19 SURGICAL SUPPLY — 125 items
ADAPTER GOLDBERG URETERAL (ADAPTER) IMPLANT
ADPR CATH 15X14FR FL DRN BG (ADAPTER)
APL PRP STRL LF DISP 70% ISPRP (MISCELLANEOUS) ×2
APPLIER CLIP 5 13 M/L LIGAMAX5 (MISCELLANEOUS)
APPLIER CLIP ROT 10 11.4 M/L (STAPLE)
APR CLP MED LRG 11.4X10 (STAPLE)
APR CLP MED LRG 5 ANG JAW (MISCELLANEOUS)
BAG COUNTER SPONGE SURGICOUNT (BAG) IMPLANT
BAG SPNG CNTER NS LX DISP (BAG)
BAG URO CATCHER STRL LF (MISCELLANEOUS) ×3 IMPLANT
BLADE EXTENDED COATED 6.5IN (ELECTRODE) ×3 IMPLANT
BNDG GAUZE DERMACEA FLUFF 4 (GAUZE/BANDAGES/DRESSINGS) IMPLANT
BNDG GZE DERMACEA 4 6PLY (GAUZE/BANDAGES/DRESSINGS) ×2
CANNULA REDUCER 12-8 DVNC XI (CANNULA) ×3 IMPLANT
CATH URETL OPEN END 6FR 70 (CATHETERS) IMPLANT
CHLORAPREP W/TINT 26 (MISCELLANEOUS) ×3 IMPLANT
CLIP APPLIE 5 13 M/L LIGAMAX5 (MISCELLANEOUS) IMPLANT
CLIP APPLIE ROT 10 11.4 M/L (STAPLE) IMPLANT
CLIP LIGATING HEM O LOK PURPLE (MISCELLANEOUS) IMPLANT
CLIP LIGATING HEMO O LOK GREEN (MISCELLANEOUS) IMPLANT
CLOTH BEACON ORANGE TIMEOUT ST (SAFETY) ×3 IMPLANT
COVER SURGICAL LIGHT HANDLE (MISCELLANEOUS) ×6 IMPLANT
COVER TIP SHEARS 8 DVNC (MISCELLANEOUS) ×3 IMPLANT
DEFOGGER SCOPE WARMER CLEARIFY (MISCELLANEOUS) ×3 IMPLANT
DEVICE TROCAR PUNCTURE CLOSURE (ENDOMECHANICALS) IMPLANT
DRAIN CHANNEL 19F RND (DRAIN) ×3 IMPLANT
DRAPE ARM DVNC X/XI (DISPOSABLE) ×12 IMPLANT
DRAPE COLUMN DVNC XI (DISPOSABLE) ×3 IMPLANT
DRAPE SURG IRRIG POUCH 19X23 (DRAPES) ×3 IMPLANT
DRIVER NDL LRG 8 DVNC XI (INSTRUMENTS) ×3 IMPLANT
DRIVER NDLE LRG 8 DVNC XI (INSTRUMENTS) ×2
DRSG OPSITE POSTOP 4X10 (GAUZE/BANDAGES/DRESSINGS) IMPLANT
DRSG OPSITE POSTOP 4X6 (GAUZE/BANDAGES/DRESSINGS) IMPLANT
DRSG OPSITE POSTOP 4X8 (GAUZE/BANDAGES/DRESSINGS) IMPLANT
DRSG TEGADERM 2-3/8X2-3/4 SM (GAUZE/BANDAGES/DRESSINGS) ×15 IMPLANT
DRSG TEGADERM 4X4.75 (GAUZE/BANDAGES/DRESSINGS) ×3 IMPLANT
ELECT REM PT RETURN 15FT ADLT (MISCELLANEOUS) ×3 IMPLANT
ENDOLOOP SUT PDS II 0 18 (SUTURE) IMPLANT
EVACUATOR SILICONE 100CC (DRAIN) ×3 IMPLANT
GAUZE SPONGE 2X2 8PLY STRL LF (GAUZE/BANDAGES/DRESSINGS) ×3 IMPLANT
GAUZE SPONGE 4X4 12PLY STRL (GAUZE/BANDAGES/DRESSINGS) IMPLANT
GLOVE BIO SURGEON STRL SZ7.5 (GLOVE) ×9 IMPLANT
GLOVE INDICATOR 8.0 STRL GRN (GLOVE) ×9 IMPLANT
GLOVE SURG LX STRL 7.5 STRW (GLOVE) ×3 IMPLANT
GOWN SRG XL LVL 4 BRTHBL STRL (GOWNS) ×3 IMPLANT
GOWN STRL NON-REIN XL LVL4 (GOWNS) ×2
GOWN STRL REUS W/ TWL XL LVL3 (GOWN DISPOSABLE) ×15 IMPLANT
GOWN STRL REUS W/TWL XL LVL3 (GOWN DISPOSABLE) ×10
GRASPER SUT TROCAR 14GX15 (MISCELLANEOUS) IMPLANT
GRASPER TIP-UP FEN DVNC XI (INSTRUMENTS) ×3 IMPLANT
GUIDEWIRE ANG ZIPWIRE 038X150 (WIRE) IMPLANT
GUIDEWIRE STR DUAL SENSOR (WIRE) IMPLANT
HOLDER FOLEY CATH W/STRAP (MISCELLANEOUS) ×3 IMPLANT
IRRIG SUCT STRYKERFLOW 2 WTIP (MISCELLANEOUS) ×2
IRRIGATION SUCT STRKRFLW 2 WTP (MISCELLANEOUS) ×3 IMPLANT
KIT PROCEDURE DVNC SI (MISCELLANEOUS) ×3 IMPLANT
KIT TURNOVER KIT A (KITS) IMPLANT
MANIFOLD NEPTUNE II (INSTRUMENTS) ×3 IMPLANT
NDL INSUFFLATION 14GA 120MM (NEEDLE) ×3 IMPLANT
NEEDLE INSUFFLATION 14GA 120MM (NEEDLE) ×2
PACK CARDIOVASCULAR III (CUSTOM PROCEDURE TRAY) ×3 IMPLANT
PACK COLON (CUSTOM PROCEDURE TRAY) ×3 IMPLANT
PACK CYSTO (CUSTOM PROCEDURE TRAY) ×3 IMPLANT
PAD CAST 4YDX4 CTTN HI CHSV (CAST SUPPLIES) IMPLANT
PAD POSITIONING PINK XL (MISCELLANEOUS) ×3 IMPLANT
PADDING CAST COTTON 4X4 STRL (CAST SUPPLIES) ×2
PENCIL SMOKE EVACUATOR (MISCELLANEOUS) IMPLANT
PROTECTOR NERVE ULNAR (MISCELLANEOUS) ×6 IMPLANT
RELOAD STAPLE 45 3.5 BLU DVNC (STAPLE) IMPLANT
RELOAD STAPLE 45 4.3 GRN DVNC (STAPLE) IMPLANT
RELOAD STAPLE 60 3.5 BLU DVNC (STAPLE) IMPLANT
RELOAD STAPLE 60 4.1 GRN THCK (STAPLE) IMPLANT
RELOAD STAPLE 60 4.3 GRN DVNC (STAPLE) IMPLANT
RETRACTOR WND ALEXIS 18 MED (MISCELLANEOUS) IMPLANT
RTRCTR WOUND ALEXIS 18CM MED (MISCELLANEOUS)
SCISSORS LAP 5X35 DISP (ENDOMECHANICALS) IMPLANT
SCISSORS MNPLR CVD DVNC XI (INSTRUMENTS) ×3 IMPLANT
SEAL UNIV 5-12 XI (MISCELLANEOUS) ×12 IMPLANT
SEALER VESSEL EXT DVNC XI (MISCELLANEOUS) ×3 IMPLANT
SLEEVE ADV FIXATION 5X100MM (TROCAR) IMPLANT
SOL ELECTROSURG ANTI STICK (MISCELLANEOUS) ×2
SOLUTION ELECTROSURG ANTI STCK (MISCELLANEOUS) ×3 IMPLANT
SPIKE FLUID TRANSFER (MISCELLANEOUS) ×3 IMPLANT
STAPLE ECHEON FLEX 60 POW ENDO (STAPLE) IMPLANT
STAPLER 60 SUREFORM DVNC (STAPLE) IMPLANT
STAPLER ECHELON POWER CIR 29 (STAPLE) IMPLANT
STAPLER ECHELON POWER CIR 31 (STAPLE) IMPLANT
STAPLER RELOAD 3.5X45 BLU DVNC (STAPLE)
STAPLER RELOAD 3.5X60 BLU DVNC (STAPLE)
STAPLER RELOAD 4.3X45 GRN DVNC (STAPLE)
STAPLER RELOAD 4.3X60 GRN DVNC (STAPLE)
STAPLER RELOAD GREEN 60MM (STAPLE) ×4
STOPCOCK 4 WAY LG BORE MALE ST (IV SETS) ×6 IMPLANT
SURGILUBE 2OZ TUBE FLIPTOP (MISCELLANEOUS) ×3 IMPLANT
SUT MNCRL AB 4-0 PS2 18 (SUTURE) ×3 IMPLANT
SUT PDS AB 1 CT1 27 (SUTURE) IMPLANT
SUT PDS AB 1 TP1 96 (SUTURE) IMPLANT
SUT PROLENE 0 CT 2 (SUTURE) IMPLANT
SUT PROLENE 2 0 KS (SUTURE) ×3 IMPLANT
SUT PROLENE 2 0 SH DA (SUTURE) IMPLANT
SUT SILK 2 0 (SUTURE)
SUT SILK 2 0 SH CR/8 (SUTURE) IMPLANT
SUT SILK 2-0 18XBRD TIE 12 (SUTURE) IMPLANT
SUT SILK 3 0 (SUTURE) ×2
SUT SILK 3 0 SH CR/8 (SUTURE) ×3 IMPLANT
SUT SILK 3-0 18XBRD TIE 12 (SUTURE) ×3 IMPLANT
SUT V-LOC BARB 180 2/0GR6 GS22 (SUTURE)
SUT VIC AB 3-0 SH 18 (SUTURE) IMPLANT
SUT VIC AB 3-0 SH 27 (SUTURE)
SUT VIC AB 3-0 SH 27XBRD (SUTURE) IMPLANT
SUT VICRYL 0 UR6 27IN ABS (SUTURE) ×3 IMPLANT
SUTURE V-LC BRB 180 2/0GR6GS22 (SUTURE) IMPLANT
SYR 10ML LL (SYRINGE) ×3 IMPLANT
SYS LAPSCP GELPORT 120MM (MISCELLANEOUS)
SYS WOUND ALEXIS 18CM MED (MISCELLANEOUS) ×2
SYSTEM LAPSCP GELPORT 120MM (MISCELLANEOUS) IMPLANT
SYSTEM WOUND ALEXIS 18CM MED (MISCELLANEOUS) ×3 IMPLANT
TAPE PAPER 3X10 WHT MICROPORE (GAUZE/BANDAGES/DRESSINGS) IMPLANT
TAPE UMBILICAL 1/8 X36 TWILL (MISCELLANEOUS) ×3 IMPLANT
TOWEL OR NON WOVEN STRL DISP B (DISPOSABLE) ×3 IMPLANT
TRAY FOLEY MTR SLVR 16FR STAT (SET/KITS/TRAYS/PACK) ×3 IMPLANT
TROCAR ADV FIXATION 5X100MM (TROCAR) ×3 IMPLANT
TUBING CONNECTING 10 (TUBING) ×12 IMPLANT
TUBING INSUFFLATION 10FT LAP (TUBING) ×3 IMPLANT
TUBING UROLOGY SET (TUBING) IMPLANT

## 2023-07-19 NOTE — Anesthesia Procedure Notes (Signed)
Procedure Name: Intubation Date/Time: 07/19/2023 12:43 PM  Performed by: Deri Fuelling, CRNAPre-anesthesia Checklist: Patient identified, Emergency Drugs available, Suction available and Patient being monitored Patient Re-evaluated:Patient Re-evaluated prior to induction Oxygen Delivery Method: Circle system utilized Preoxygenation: Pre-oxygenation with 100% oxygen Induction Type: IV induction Ventilation: Mask ventilation without difficulty Laryngoscope Size: Mac and 3 Grade View: Grade I Tube type: Oral Tube size: 7.0 mm Number of attempts: 1 Airway Equipment and Method: Stylet and Oral airway Placement Confirmation: ETT inserted through vocal cords under direct vision, positive ETCO2 and breath sounds checked- equal and bilateral Secured at: 21 cm Tube secured with: Tape Dental Injury: Teeth and Oropharynx as per pre-operative assessment

## 2023-07-19 NOTE — Op Note (Signed)
Operative Note  Preoperative diagnosis:  1.  Diverticulitis  Postoperative diagnosis: 1.  Diverticulitis  Procedure(s): 1.  Cystoscopy with bilateral ureteral catheterization with instillation of firefly  Surgeon: Modena Slater, MD  Assistants: None  Anesthesia: General  Complications: None immediate  EBL: Minimal  Specimens: 1.  None  Drains/Catheters: 1.  Foley catheter  Intraoperative findings: Normal urethra and bladder  Indication: 72 year old female status post partial colectomy with colostomy presents for colostomy reversal.  Bilateral instillation of firefly was requested.  Description of procedure:  The patient was identified and consent was obtained.  The patient was taken to the operating room and placed in the supine position.  The patient was placed under general anesthesia.  Perioperative antibiotics were administered.  The patient was placed in dorsal lithotomy.  Patient was prepped and draped in a standard sterile fashion and a timeout was performed.  A 21 French rigid cystoscope was advanced into the urethra and into the bladder.  Complete cystoscopy was performed with no abnormal findings.  The left ureteral orifice was intubated with a sensor wire which was advanced up to the kidney.  An open-ended ureteral catheter was advanced over the wire and up to the proximal ureter.  Wire was withdrawn.  Firefly was instilled into the ureter while withdrawing the open-ended ureteral catheter.  7.5 cc of firefly was instilled into the ureter.  Same was performed on the contralateral side.  I withdrew the scope and placed a Foley catheter.  Patient tolerated procedure well and was stable.  Case was handed over to general surgery.  Plan: As per general surgery

## 2023-07-19 NOTE — H&P (Signed)
CC: Here today for surgery  HPI: Jenny Glass is an 72 y.o. female with history of HTN, HLD, C diff, PMR (on 2 mg/day of prednisone) whom is seen in the office today as a referral by Dr. Nicholos Johns for evaluation of complicated diverticulitis.   Colonoscopy 04/07/12 - Dr. Loreta Ave -small internal hemorrhoids, extensive sigmoid diverticulosis. Otherwise normal.  CT A/P 07/11/22 1. Acute sigmoid colon diverticulitis. Small peridiverticular abscess/fluid collection, 1.9 cm in greatest dimension. No other complicating features. 2. No other acute abnormality within the abdomen or pelvis.   CT A/P 07/15/22 No significant change in moderate sigmoid diverticulitis.  Slight increase in size of 2.6 cm diverticular abscess in central sigmoid mesentery.  Increased mild bilateral hydroureteronephrosis due to involvement of both ureters by diverticulitis.  Stable moderate hiatal hernia.  CT A/P 07/23/22 Imaging improvement. Less inflammatory change of the sigmoid colon itself. Less edema in the adjacent fat. Decreasing size of the small fluid collection in the sigmoid mesentery, 1 x 1.5 cm today compared with 1.5 x 2.5 cm previously.  CT A/P 08/15/22 1. Complicated sigmoid Diverticulitis with unresolved sigmoid inflammation (perhaps mildly improved since 07/23/2022). Secondary involvement of and architectural distortion on both the bladder fundus and dorsal uterus, but no evidence of fistula. No abscess or drainable fluid now. Trace pelvic free fluid is likely reactive.  2. No bowel obstruction or new abnormality in the abdomen or pelvis. Small to moderate gastric hiatal hernia. Chronic right renal cortical scarring and atrophy. Mild calcified aortic atherosclerosis.  Referred to see me. Since being discharged 07/27/2022, she reports she has been doing better than when she was in the hospital. Aside from her low-dose prednisone, she has been off all other treatment for her PMR. She denies  any abdominal pain at present. No nausea or vomiting. No fever or chills. She was prescribed a prolonged course of antibiotics that she is almost completed. Having regular bowel movements. The diarrhea that she experienced while admitted has improved/resolved. No blood in her stool.  Cscope 09/17/22 -  - One 2 mm polyp in the cecum, removed with a cold biopsy forceps. Resected and retrieved. - Three 3 to 4 mm polyps in the transverse colon and in the ascending colon, removed with a cold snare. Resected and retrieved. - Diverticulosis with narrowing in the sigmoid colon and in the descending colon. - Non-bleeding internal hemorrhoids. - ADDENDUM: Due to patient discomfort after the procedure, patient was brought back to the endoscopy suite and air was suctioned from the right side of the colon.  OR-10/12/2022: Robotic assisted low anterior resection with end colostomy Robotic assisted left salpingo-oophorectomy Drainage of intra-abdominal abscess 4 x 4 cm Bilateral transversus abdominus plane (TAP) blocks  FINDINGS: Significant inflammatory adhesions about her sigmoid colon related to her likely smoldering diverticulitis. Intra-abdominal abscess was also encountered and drained containing approximately 15 cc of milky Seletha Zimmermann pus. This was in the vicinity of her pelvic sigmoid. Densely involving her left fallopian tube and ovary which were unable to be separated and therefore were removed en bloc. Inflammation went all way down to her rectum down to her pelvic floor/peritoneal reflection and posterior uterus. Given all the inflammatory type changes as well as the rectal involvement all way down to her mid rectum, it was felt that Hartman's type resection would be necessary as opposed to any sort of primary anastomosis with/without diverting ileostomy. Proximal sigmoid colostomy fashion in the left lower quadrant and her previously marked ostomy marking site.   FINDINGS A.  COLON, RECTOSIGMOID,  RESECTION:  - Diverticulosis with focal diverticular chronic inflammation and  fibrosis.  - Tubular adenoma, negative for high-grade dysplasia (0.3 cm in  greatest dimension).  - Benign regional lymph nodes (2).  - No malignancy identified.   Specimen: Received fresh is "rectosigmoid colon stitch is proximal" Length: 16 cm in length by 1.9 cm in diameter Serosa: Pink-tan with a minimal amount of gray adhesion, but otherwise intact. Contents: A minimal amount of fecal material Mucosa/Wall: The mucosa is tan and glistening with a wall thickness of 0.6 cm. Multiple diverticula are identified ranging from 0.2 to 0.8 cm. No further distinct lesions are grossly identified. Lymph nodes: The pericolic fat is palpated and 2 lymph node candidates measuring 0.3 and 0.4 cm are identified and sampled. Block Summary: A1 proximal margin A2 distal margin A3-A4 representative diverticula A5 two lymph node candidates, whole  She was admitted postoperatively and recovered well. She underwent ostomy care teaching with our wound ostomy team. On 10/17/2022, she was comfortable with and stable for discharge home.   Currently remains on a low-dose of prednisone-approximately 2 mg/day.  INTERVAL HX GGE 03/25/23 1. Water-soluble contrast enema as described. 2. Contrast opacification of the rectum and sigmoid colon (presumably to the suture line). No stricture or contrast leak identified. 3. Sigmoid diverticulosis.  Flex sig 04/2023: A few cm of evident sigmoid with some diverticuloisis. Intact staple line. Healthy appearing foreshortened rectum. Entire Hartmann stump is 14 cm long.   Has been doing well. No complaints at present. Colostomy is working well. Remains motivated to have this reversed.  PMH: HTN, HLD, C diff, PMR (on 2 mg/day of prednisone)  PSH: BTL; denies any other abdominal or pelvic surgical history  FHx: Denies any known family history of colorectal, breast, endometrial or ovarian  cancer  Social Hx: Denies use of tobacco/EtOH/illicit drug. She is here today with her husband   She denies any changes in health or health history since we met in the office. No new medications/allergies. She states she is ready for surgery today. Tolerated bowel prep with satisfactory result.  Past Medical History:  Diagnosis Date   Arthritis    Carotid artery stenosis    Chronic back pain    Diverticulitis    Fibromyalgia    Hyperlipidemia    Hypertension    no meds   Numbness and tingling    hands bilat and lower legs bilat comes and goes    Pneumonia    Rheumatoid arthritis (HCC)    polymyalgia rheumatica   UTI (lower urinary tract infection)     Past Surgical History:  Procedure Laterality Date   COLON SURGERY     COLOSTOMY Left    LLQ   FLEXIBLE SIGMOIDOSCOPY N/A 04/08/2023   Procedure: FLEXIBLE SIGMOIDOSCOPY;  Surgeon: Andria Meuse, MD;  Location: [facility];  Service: General;  Laterality: N/A;   SHOULDER OPEN ROTATOR CUFF REPAIR Left 11/10/2015   Procedure: LEFT SHOULDER MINI OPEN ROTATOR CUFF REPAIR AND DISTAL CLAVICAL RESECTION;  Surgeon: Jene Every, MD;  Location: [facility];  Service: Orthopedics;  Laterality: Left;   XI ROBOTIC ASSISTED LOWER ANTERIOR RESECTION N/A 10/12/2022   Procedure: ROBOTIC SIGMOIDECTOMY WITH LOW ANTERIOR RESECTION, OSTOMY CREATION;  Surgeon: Andria Meuse, MD;  Location: [facility];  Service: General;  Laterality: N/A;    Family History  Problem Relation Age of Onset   Cancer Mother    Cancer Father    Colon cancer Neg Hx    Esophageal cancer Neg  Hx    Rectal cancer Neg Hx    Stomach cancer Neg Hx     Social:  reports that she has never smoked. She has never used smokeless tobacco. She reports that she does not drink alcohol and does not use drugs.  Allergies: No Known Allergies  Medications: I have reviewed the patient's current medications.  No results found for this or any previous visit (from the past 48  hour(s)).  No results found.   PE Blood pressure (!) 150/87, pulse 65, temperature 97.8 F (36.6 C), temperature source Oral, resp. rate 18, height 5\' 3"  (1.6 m), weight 69.4 kg, SpO2 98%. Constitutional: NAD; conversant Eyes: Moist conjunctiva Lungs: Normal respiratory effort CV: RRR GI: Abd soft, NT/ND Psychiatric: Appropriate affect  No results found for this or any previous visit (from the past 48 hour(s)).  No results found.  A/P: Jenny Glass is an 72 y.o. female with hx of HTN, HLD, C diff, PMR (on 2 mg/day of prednisone) here for postop - hx of recent complicated diverticulitis; now s/p robotic assisted Hartmann's + Left salpingo-oophorectomy.  GGE - small possible remnant sigmoid Flex sig - same, healthy stump 14 cm long, some possible diverticula at stump  -She continues to do quite well.  -Remains highly motivated to attempt reversal if at all possible. She is aware that there are certain situations that may preclude ultimate reversal based on findings intraoperatively primarily.  -The anatomy and physiology of the GI tract was reviewed with the patient as it pertains to her current anatomy with a colostomy. -We have discussed various different treatment options going forward including surgery versus ongoing observation. We did discuss that with a colostomy and is certainly possible to live a "normal" life and that colostomy reversal surgery should only been retained for significant quality-of-life considerations. She is highly motivated to have this reversed.  -We discussed robotic assisted colostomy takedown, possible low anterior resection, flexible sigmoidoscopy, cystoscopy/ureteral ICG ([facility])  -The planned procedure, material risks (including, but not limited to, pain, bleeding, infection, scarring, need for blood transfusion, damage to surrounding structures- blood vessels/nerves/viscus/organs, damage to ureter, urine leak, leak from anastomosis, need  for additional procedures, low anterior resection syndrome (LARS) = increased fecal urgency and/or frequency, scenarios where a stoma may be again necessary and where it may be permanent, worsening of pre-existing medical conditions, chronic diarrhea, constipation secondary to narcotic use, hernia, recurrence, pneumonia, heart attack, stroke, death) benefits and alternatives to surgery were discussed at length. The patient's and her husband's questions were answered to their satisfaction, they voiced understanding and elected to proceed with surgery. Additionally, we discussed typical postoperative expectations and the recovery process.  Marin Olp, MD [facility], [facility]

## 2023-07-19 NOTE — Discharge Instructions (Signed)
POST OP INSTRUCTIONS AFTER COLON SURGERY  DIET: Be sure to include lots of fluids daily to stay hydrated - 64oz of water per day (8, 8 oz glasses).  Avoid fast food or heavy meals for the first couple of weeks as your are more likely to get nauseated. Avoid raw/uncooked fruits or vegetables for the first 4 weeks (its ok to have these if they are blended into smoothie form). If you have fruits/vegetables, make sure they are cooked until soft enough to mash on the roof of your mouth and chew your food well. Otherwise, diet as tolerated.  Take your usually prescribed home medications unless otherwise directed.  PAIN CONTROL: Pain is best controlled by a usual combination of three different methods TOGETHER: Ice/Heat Over the counter pain medication Prescription pain medication Most patients will experience some swelling and bruising around the surgical site.  Ice packs or heating pads (30-60 minutes up to 6 times a day) will help. Some people prefer to use ice alone, heat alone, alternating between ice & heat.  Experiment to what works for you.  Swelling and bruising can take several weeks to resolve.   It is helpful to take an over-the-counter pain medication regularly for the first few weeks: Ibuprofen (Motrin/Advil) - 200mg  tabs - take 3 tabs (600mg ) every 6 hours as needed for pain (unless you have been directed previously to avoid NSAIDs/ibuprofen) Acetaminophen (Tylenol) - you may take 650mg  every 6 hours as needed. You can take this with motrin as they act differently on the body. If you are taking a narcotic pain medication that has acetaminophen in it, do not take over the counter tylenol at the same time. NOTE: You may take both of these medications together - most patients  find it most helpful when alternating between the two (i.e. Ibuprofen at 6am, tylenol at 9am, ibuprofen at 12pm ..Marland Kitchen) A  prescription for pain medication should be given to you upon discharge.  Take your pain medication as  prescribed if your pain is not adequatly controlled with the over-the-counter pain reliefs mentioned above.  Avoid getting constipated.  Between the surgery and the pain medications, it is common to experience some constipation.  Increasing fluid intake and taking a fiber supplement (such as Metamucil, Citrucel, FiberCon, MiraLax, etc) 1-2 times a day regularly will usually help prevent this problem from occurring.  A mild laxative (prune juice, Milk of Magnesia, MiraLax, etc) should be taken according to package directions if there are no bowel movements after 48 hours.    Dressing: Your incisions are covered in Dermabond which is like sterile superglue for the skin. This will come off on it's own in a couple weeks. It is waterproof and you may bathe normally starting the day after your surgery in a shower. Avoid baths/pools/lakes/oceans until your wounds have fully healed.  Your former colostomy site may be covered with dry gauze and changed daily or as needed more than once per day.  It is okay to bathe normally and get the former colostomy site wet in the shower with soap and water over your wound.  This should heal over the ensuing 1 to 2 months.  ACTIVITIES as tolerated:   Avoid heavy lifting (>10lbs or 1 gallon of milk) for the next 6 weeks. You may resume regular daily activities as tolerated--such as daily self-care, walking, climbing stairs--gradually increasing activities as tolerated.  If you can walk 30 minutes without difficulty, it is safe to try more intense activity such as jogging, treadmill,  bicycling, low-impact aerobics.  DO NOT PUSH THROUGH PAIN.  Let pain be your guide: If it hurts to do something, don't do it. You may drive when you are no longer taking prescription pain medication, you can comfortably wear a seatbelt, and you can safely maneuver your car and apply brakes.  FOLLOW UP in our office Please call [facility] at [phone] to set up an appointment to see your surgeon in  the office for a follow-up appointment approximately 2 weeks after your surgery. Make sure that you call for this appointment the day you arrive home to insure a convenient appointment time.  9. If you have disability or family leave forms that need to be completed, you may have them completed by your primary care physician's office; for return to work instructions, please ask our office staff and they will be happy to assist you in obtaining this documentation   When to call us [phone]: Poor pain control Reactions / problems with new medications (rash/itching, etc)  Fever over 101.5 F (38.5 C) Inability to urinate Nausea/vomiting Worsening swelling or bruising Continued bleeding from incision. Increased pain, redness, or drainage from the incision  The clinic staff is available to answer your questions during regular business hours (8:30am-5pm).  Please don't hesitate to call and ask to speak to one of our nurses for clinical concerns.   A surgeon from [facility] is always on call at the hospitals   If you have a medical emergency, go to the nearest emergency room or call 911.  [facility] [address], [address], [place], [place]  [address] MAIN: [phone] FAX: [phone] [email or web address]

## 2023-07-19 NOTE — Op Note (Signed)
PATIENT: Jenny Glass  71 y.o. female  Patient Care Team: Georgianne Fick, MD as PCP - General (Internal Medicine)  PREOP DIAGNOSIS: COLOSTOMY STATUS HISTORY OF DIVERTICULITIS  POSTOP DIAGNOSIS: COLOSTOMY STATUS HISTORY OF DIVERTICULITIS  PROCEDURE:  Robotic assisted low anterior resection Robotic takedown of end colostomy Diagnostic flexible sigmoidoscopy (necessary to assess rectal stump and apparent stricture) Intraoperative assessment of perfusion using ICG fluorescence imaging Bilateral transversus abdominus plane (TAP) blocks  SURGEON: Stephanie Coup. Cliffton Asters, MD  ASSISTANT: Romie Levee, MD  ANESTHESIA: General endotracheal  EBL: 50 mL Total I/O In: 1900 [I.V.:1800; IV Piggyback:100] Out: 450 [Urine:400; Blood:50]  DRAINS: None  SPECIMEN:  Rectosigmoid colon stitch proximal Colostomy  COUNTS: Sponge, needle and instrument counts were reported correct x2  FINDINGS: Thickened rectosigmoid stump. Apparent narrowing in pelvis overlying the mid rectum with scar. Flexible sigmoidoscopy demonstrated an apparent intrinsic narrowing of the rectum at this level with smooth normal appearing mucosa. This was difficult to pass with a flexible sigmoidoscope but the mucosa beyond this is also normal in appearance.  For these reasons though, we opted to carry out the low anterior resection and hook up to nice healthy supple rectum distally.  A well perfused, tension free, hemostatic, air tight 29 mm EEA colorectal anastomosis fashioned 8 cm from the anal verge by flexible sigmoidoscopy.   NARRATIVE: Informed consent was verified. The patient was taken to the operating room, placed supine on the operating table and SCD's were applied. General endotracheal anesthesia was induced without difficulty. She was then positioned in the lithotomy position with Allen stirrups.  Pressure points were evaluated and padded.  A foley catheter was then placed by nursing under sterile conditions. Hair  on the abdomen was clipped.  She was secured to the operating table. Dr. Alvester Morin with [facility] scrubbed for his portion of the procedure.  Please refer to his notes for details. The abdomen was then prepped and draped in the standard sterile fashion. Surgical timeout was called indicating the correct patient, procedure, positioning and need for preoperative antibiotics.   An OG tube was placed by anesthesia and confirmed to be to suction.  At Palmer's point, a stab incision was created and the Veress needle was introduced into the peritoneal cavity on the first attempt.  Intraperitoneal location was confirmed by the aspiration and saline drop test.  Pneumoperitoneum was established to a maximum pressure of 15 mmHg using CO2.  Following this, the abdomen was marked for planned trocar sites.  Just to the right and cephalad to the umbilicus, an 8 mm incision was created and an 8 mm blunt tipped robotic trocar was cautiously placed into the peritoneal cavity.  The laparoscope was inserted and demonstrated no evidence of trocar site nor Veress needle site complications.  The Veress needle was removed.  Bilateral transversus abdominis plane blocks were then created using a dilute mixture of Exparel with Marcaine.  3 additional 8 mm robotic trochars were placed under direct visualization roughly in a line extending from the right ASIS towards the left upper quadrant. The bladder was inspected and noted to be at/below the pubic symphysis.  Staying 3 fingerbreadths above the pubic symphysis, an incision was created and the 12 mm robotic trocar inserted directed cephalad into the peritoneal cavity under direct visualization.  An additional 5 mm assist port was placed in the right lateral abdomen under direct visualization.  The abdomen was surveyed and there was only 1 single adhesive band which was ultimately lysed sharply.  She was positioned  in Trendelenburg with the left side tilted slightly up.  Small bowel was  carefully retracted out of the pelvis.  The robot was then docked and I went to the console.   Small bowel was reflected out of the pelvis.  The apparent stump is identified in the pelvis.  Under near-infrared light, the ureters were able to be visualized in the right ureter lies more medially and underneath her Hartman's stump.  The Hartmann stump is grasped and elevated anteriorly.  Were able to carefully dissect this free from the plane overlying the right ureter without injuring the ureter.  This was done sharply.  After this was mobilized, the trajectory of the right ureter went back down to the pelvic sidewall and was well away from our plan dissection.  Her left ureter was also noted to be well away from the pelvis in its normal anatomic configuration.  The rectosigmoid stump was mobilized maintaining a plane dissection within the space between the presacral fascia and the fascia propria of the rectum.  We mobilized posteriorly first followed by laterally.  After this, I went below and did a rectal exam.  The anus and distalmost rectum were normal.  We cautiously inserted lubricated EEA sizers, 25 mm to begin with.  This was gently advanced.  In doing so, there was an apparent narrowing fairly distal within the mid to upper rectum.  We are not able to advance beyond this.  Therefore, we opted to proceed with a diagnostic flexible sigmoidoscopy.  The flexible sigmoidoscope was inserted into the anal canal under direct visualization.  This was advanced up into the rectum.  The distal rectum is normal in appearance with smooth mucosa.  There is an apparent narrowing at the junction of the mid and upper rectum which was somewhat difficult to traverse but we are able to do so.  The mucosa beyond this is healthy in appearance.  There is no mucosal abnormality to suggest any sort of neoplasm at this location.  That said, it was clear that in order to reverse her colostomy we would need to have healthy supple  rectum for the anastomosis and therefore would need to carry out a low anterior resection to get below this.  The sigmoidoscope was withdrawn.  I went back to the console.  The rectal stump is further mobilized maintaining our TME plane of dissection down to and below the level of the apparent narrowing.  This was noted to be at approximately 9 cm.  The mesorectum at this location was then cleared circumferentially.  Attention was then turned to the colostomy.  The descending colon is fully mobilized by incising the Raynee Mccasland line of Toldt all the way up to the level of the splenic flexure.  The associate mesocolon was also mobilized off the underlying retroperitoneal structures.  There is a visible pulse in the mesentery going all the way up to the abdominal wall.  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  I scrubbed back in.  We began by taking down the colostomy from the skin and underlying subcutaneous tissue.  We maintained a plane outside the serosa and colonic mesentery.  The colostomy was fully mobilized from the subcutaneous space and rectus fascia.  It is healthy in appearance.  There is a palpable pulse in the mesentery.  A pursestring stitch was used to occlude the colostomy the level of the skin.  This was then placed back into the  abdomen.  An Alexis wound protector was placed and pneumoperitoneum reestablished.  Laparoscopically, were able to confirm that this reaches into the deep pelvis without any tension and remains in that location.  We had satisfactory length at this rupture.  We proceeded with an ICG perfusion test.  2 and half milligrams was administered by anesthesia.  Under near-infrared light, there is avid uptake of the dye within the distal rectum all out to the location of the cleared mesentery.  There is also adequate perfusion apparent going all the way out to the mucocutaneous junction of her former colostomy.  We  were then able to place a 12 port through our Alexis wound protector.  A laparoscopic powered Echelon stapler with a green load was then utilized to divide the rectum at the cleared location.  This was done with 2 firings of the stapler.  The staple line is inspected noted to be hemostatic with well-formed staples.  The colon at the colostomy site was inspected and noted to be free of injury.  Just proximal to the mucocutaneous junction, the mesentery was cleared and ligated.  A pursestring device was then placed.  A 2-0 Prolene on a Keith needle was passed.  The colostomy was excised and passed off the specimen.  3-0 silk sutures were used to create belt loops around the pursestring.  EEA sizers were passed and a 29 mm EEA selected.  The anvil was placed in the pursestring tied.  A small amount of fat was cleared from the planned staple line.  Quality of the tissues is good.  This is placed back into the abdomen.  An Alexis wound protector was placed. A cap is placed and pneumoperitoneum reestablished.  The anvil reaches into the deep pelvis without any tension and remains in that location.  I then went below to pass the stapler.  Under direct visualization, EEA sizers were serially passed.  The 29 mm EEA stapler was passed.  The spike is deployed just anterior to the staple line.  The components were then mated.  Orientation is confirmed such that there is no twisting of the colon or small bowel underneath the mesenteric defect.  The stapler was then closed, held, and fired.  Colon proximally anastomosis is gently occluded.  The pelvis was filled with irrigation.  I passed the flexible sigmoidoscope to perform a leak test.  The anastomosis is hemostatic in appearance and airtight.  All tissues are pink in color. This is located at 8 cm from the anal verge by flexible sigmoidoscopy. Additionally, looking from above, there is no tension on the colon or mesentery.  Sigmoidoscope was withdrawn. I scrubbed back in.  Irrigation was evacuated from the pelvis.  The abdomen and pelvis are surveyed and noted to be completely hemostatic without any apparent injury.  Under direct visualization, all trochars are removed.  The Alexis wound protector was removed.    The rectus fascia at the former colostomy site was then closed using 2 running #1 PDS sutures.  This is able to be done without any significant tension.  The fascia was then palpated and noted to be completely closed.  Additional anesthetic was infiltrated at this site.   Sponge, needle, and instrument counts were reported correct x2. 4-0 Monocryl subcuticular suture was used to close the skin of all incision sites.  Dermabond was placed over all incisions.  A 0 Vicryl pursestring suture was used to cinch the skin down at the former colostomy site.  The wound is then  packed with a moist Kerlex gauze.  Additional gauze was placed and secured with tape.  She was then taken out of lithotomy, awakened from anesthesia, extubated, and transferred to a stretcher for transport to PACU in satisfactory condition having tolerated the procedure well.

## 2023-07-19 NOTE — Transfer of Care (Signed)
Immediate Anesthesia Transfer of Care Note  Patient: Jenny Glass  Procedure(s) Performed: XI ROBOTIC LAR WITH COLOSTOMY TAKEDOWN AND ASSESSMENT OF PERFUSION USING FIREFLY AND BILATERAL TAP BLOCK FLEXIBLE SIGMOIDOSCOPY (Rectum) CYSTOSCOPY with FIREFLY INJECTION  Patient Location: PACU  Anesthesia Type:General  Level of Consciousness: sedated and patient cooperative  Airway & Oxygen Therapy: Patient Spontanous Breathing and Patient connected to face mask oxygen  Post-op Assessment: Report given to RN and Post -op Vital signs reviewed and stable  Post vital signs: Reviewed  Last Vitals:  Vitals Value Taken Time  BP 155/86 07/19/23 1545  Temp    Pulse 74 07/19/23 1547  Resp 11 07/19/23 1547  SpO2 100 % 07/19/23 1547  Vitals shown include unfiled device data.  Last Pain:  Vitals:   07/19/23 0900  TempSrc: Oral  PainSc: 0-No pain      Patients Stated Pain Goal: 4 (07/19/23 0900)  Complications: No notable events documented.

## 2023-07-20 LAB — CBC
HCT: 33.6 % — ABNORMAL LOW (ref 36.0–46.0)
Hemoglobin: 10.6 g/dL — ABNORMAL LOW (ref 12.0–15.0)
MCH: 28.2 pg (ref 26.0–34.0)
MCHC: 31.5 g/dL (ref 30.0–36.0)
MCV: 89.4 fL (ref 80.0–100.0)
Platelets: 227 10*3/uL (ref 150–400)
RBC: 3.76 MIL/uL — ABNORMAL LOW (ref 3.87–5.11)
RDW: 14.9 % (ref 11.5–15.5)
WBC: 17.3 10*3/uL — ABNORMAL HIGH (ref 4.0–10.5)
nRBC: 0 % (ref 0.0–0.2)

## 2023-07-20 LAB — BASIC METABOLIC PANEL
Anion gap: 8 (ref 5–15)
BUN: 9 mg/dL (ref 8–23)
CO2: 25 mmol/L (ref 22–32)
Calcium: 8.4 mg/dL — ABNORMAL LOW (ref 8.9–10.3)
Chloride: 102 mmol/L (ref 98–111)
Creatinine, Ser: 0.89 mg/dL (ref 0.44–1.00)
GFR, Estimated: >60 mL/min (ref >60)
Glucose, Bld: 139 mg/dL — ABNORMAL HIGH (ref 70–99)
Potassium: 3.9 mmol/L (ref 3.5–5.1)
Sodium: 135 mmol/L (ref 135–145)

## 2023-07-20 NOTE — Progress Notes (Signed)
1 Day Post-Op Robotic LAR and colostomy reversal Subjective: Did well overnight, had a small BM.  Pain in LLQ  Objective: Vital signs in last 24 hours: Temp:  [96.6 F (35.9 C)-98.6 F (37 C)] 98.1 F (36.7 C) (09/14 0910) Pulse Rate:  [59-91] 76 (09/14 1037) Resp:  [8-18] 16 (09/14 0910) BP: (82-172)/(66-95) 105/66 (09/14 1037) SpO2:  [95 %-100 %] 96 % (09/14 1037) Weight:  [68.4 kg] 68.4 kg (09/14 0500)   Intake/Output from previous day: 09/13 0701 - 09/14 0700 In: 3542.5 [P.O.:840; I.V.:2602.5; IV Piggyback:100] Out: 1850 [Urine:1800; Blood:50] Intake/Output this shift: No intake/output data recorded.   General appearance: alert and cooperative GI: soft, non-distended  Incision: healing well, no significant drainage  Lab Results:  Recent Labs    07/20/23 0522  WBC 17.3*  HGB 10.6*  HCT 33.6*  PLT 227   BMET Recent Labs    07/20/23 0522  NA 135  K 3.9  CL 102  CO2 25  GLUCOSE 139*  BUN 9  CREATININE 0.89  CALCIUM 8.4*   PT/INR No results for input(s): "LABPROT", "INR" in the last 72 hours. ABG No results for input(s): "PHART", "HCO3" in the last 72 hours.  Invalid input(s): "PCO2", "PO2"  MEDS, Scheduled  acetaminophen  1,000 mg Oral Q6H   alvimopan  12 mg Oral BID   feeding supplement  237 mL Oral BID BM   heparin injection (subcutaneous)  5,000 Units Subcutaneous Q8H    Studies/Results: No results found.  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

## 2023-07-20 NOTE — Anesthesia Postprocedure Evaluation (Signed)
Anesthesia Post Note  Patient: Jenny Glass  Procedure(s) Performed: XI ROBOTIC LAR WITH COLOSTOMY TAKEDOWN AND ASSESSMENT OF PERFUSION USING FIREFLY AND BILATERAL TAP BLOCK FLEXIBLE SIGMOIDOSCOPY (Rectum) CYSTOSCOPY with FIREFLY INJECTION     Patient location during evaluation: PACU Anesthesia Type: General Level of consciousness: awake and alert Pain management: pain level controlled Vital Signs Assessment: post-procedure vital signs reviewed and stable Respiratory status: spontaneous breathing, nonlabored ventilation, respiratory function stable and patient connected to nasal cannula oxygen Cardiovascular status: blood pressure returned to baseline and stable Postop Assessment: no apparent nausea or vomiting Anesthetic complications: no   No notable events documented.  Last Vitals:  Vitals:   07/20/23 0910 07/20/23 1037  BP: (!) 82/67 105/66  Pulse: 72 76  Resp: 16   Temp: 36.7 C   SpO2: 98% 96%    Last Pain:  Vitals:   07/20/23 1007  TempSrc:   PainSc: 3                  Trevor Iha

## 2023-07-21 ENCOUNTER — Encounter (HOSPITAL_COMMUNITY): Payer: Self-pay | Admitting: Surgery

## 2023-07-21 LAB — BASIC METABOLIC PANEL
Anion gap: 6 (ref 5–15)
BUN: 13 mg/dL (ref 8–23)
CO2: 26 mmol/L (ref 22–32)
Calcium: 7.9 mg/dL — ABNORMAL LOW (ref 8.9–10.3)
Chloride: 104 mmol/L (ref 98–111)
Creatinine, Ser: 0.95 mg/dL (ref 0.44–1.00)
GFR, Estimated: >60 mL/min (ref >60)
Glucose, Bld: 93 mg/dL (ref 70–99)
Potassium: 3.4 mmol/L — ABNORMAL LOW (ref 3.5–5.1)
Sodium: 136 mmol/L (ref 135–145)

## 2023-07-21 LAB — CBC
HCT: 30.2 % — ABNORMAL LOW (ref 36.0–46.0)
Hemoglobin: 9.2 g/dL — ABNORMAL LOW (ref 12.0–15.0)
MCH: 28.1 pg (ref 26.0–34.0)
MCHC: 30.5 g/dL (ref 30.0–36.0)
MCV: 92.4 fL (ref 80.0–100.0)
Platelets: 193 10*3/uL (ref 150–400)
RBC: 3.27 MIL/uL — ABNORMAL LOW (ref 3.87–5.11)
RDW: 15.4 % (ref 11.5–15.5)
WBC: 11.5 10*3/uL — ABNORMAL HIGH (ref 4.0–10.5)
nRBC: 0 % (ref 0.0–0.2)

## 2023-07-21 MED ORDER — TRAMADOL HCL 50 MG PO TABS
50.0000 mg | ORAL_TABLET | Freq: Four times a day (QID) | ORAL | Status: DC | PRN
  Administered 2023-07-23: 50 mg via ORAL
  Filled 2023-07-21: qty 1

## 2023-07-21 MED ORDER — OXYCODONE HCL 5 MG PO TABS
5.0000 mg | ORAL_TABLET | ORAL | Status: DC | PRN
  Administered 2023-07-21 – 2023-07-23 (×3): 10 mg via ORAL
  Filled 2023-07-21 (×3): qty 2

## 2023-07-21 MED ORDER — METHOCARBAMOL 500 MG PO TABS
1000.0000 mg | ORAL_TABLET | Freq: Three times a day (TID) | ORAL | Status: DC
  Administered 2023-07-21 – 2023-07-23 (×7): 1000 mg via ORAL
  Filled 2023-07-21 (×7): qty 2

## 2023-07-21 NOTE — Progress Notes (Addendum)
2 Days Post-Op Robotic LAR and colostomy reversal Subjective: Did well overnight, having bowel function.  Notes black stools.  Pain in LLQ inhibiting movement.  PO pain meds help some, not taking in much of her fulls   Objective: Vital signs in last 24 hours: Temp:  [97.5 F (36.4 C)-98.9 F (37.2 C)] 98.9 F (37.2 C) (09/15 0549) Pulse Rate:  [68-80] 80 (09/15 0549) Resp:  [15-18] 18 (09/15 0549) BP: (105-125)/(66-74) 105/67 (09/15 0549) SpO2:  [93 %-100 %] 93 % (09/15 0549) Weight:  [67.6 kg] 67.6 kg (09/15 0500)   Intake/Output from previous day: 09/14 0701 - 09/15 0700 In: 1938.7 [P.O.:720; I.V.:1218.7] Out: 0  Intake/Output this shift: Total I/O In: 240 [P.O.:240] Out: 100 [Urine:100]   General appearance: alert and cooperative GI: soft, non-distended  Incision: healing well, no significant drainage, packing removed  Lab Results:  Recent Labs    07/20/23 0522 07/21/23 0418  WBC 17.3* 11.5*  HGB 10.6* 9.2*  HCT 33.6* 30.2*  PLT 227 193   BMET Recent Labs    07/20/23 0522 07/21/23 0418  NA 135 136  K 3.9 3.4*  CL 102 104  CO2 25 26  GLUCOSE 139* 93  BUN 9 13  CREATININE 0.89 0.95  CALCIUM 8.4* 7.9*   PT/INR No results for input(s): "LABPROT", "INR" in the last 72 hours. ABG No results for input(s): "PHART", "HCO3" in the last 72 hours.  Invalid input(s): "PCO2", "PO2"  MEDS, Scheduled  acetaminophen  1,000 mg Oral Q6H   feeding supplement  237 mL Oral BID BM   heparin injection (subcutaneous)  5,000 Units Subcutaneous Q8H   methocarbamol  1,000 mg Oral TID    Studies/Results: No results found.  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   S/P  laparoscopic-assisted sigmoidectomy 10/12/2022   Medication monitoring encounter 08/09/2022   Polymyalgia rheumatica (HCC) 07/25/2022   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 07/25/2022   Malnutrition of moderate degree 07/17/2022   Abscess of sigmoid colon due to diverticulitis 07/11/2022   Hyperlipidemia    Chronic back pain    Hypertension    PVD (peripheral vascular disease) (HCC)    Chronic low back pain 05/25/2016   Rotator cuff tear 11/10/2015   Rupture of rotator cuff of shoulder 11/10/2015    Doing well Ostomy external dressing changed  Plan: Cont fulls per pt request SL IVF's Oxycodone and scheduled Robaxin added for better pain relief Ambulate in hall   LOS: 2 days     .Vanita Panda, MD [facility], [place]    07/21/2023 9:57 AM

## 2023-07-22 LAB — BASIC METABOLIC PANEL
Anion gap: 8 (ref 5–15)
BUN: 11 mg/dL (ref 8–23)
CO2: 25 mmol/L (ref 22–32)
Calcium: 8.1 mg/dL — ABNORMAL LOW (ref 8.9–10.3)
Chloride: 105 mmol/L (ref 98–111)
Creatinine, Ser: 0.95 mg/dL (ref 0.44–1.00)
GFR, Estimated: >60 mL/min (ref >60)
Glucose, Bld: 87 mg/dL (ref 70–99)
Potassium: 3.4 mmol/L — ABNORMAL LOW (ref 3.5–5.1)
Sodium: 138 mmol/L (ref 135–145)

## 2023-07-22 LAB — CBC
HCT: 30.7 % — ABNORMAL LOW (ref 36.0–46.0)
Hemoglobin: 9.3 g/dL — ABNORMAL LOW (ref 12.0–15.0)
MCH: 27.8 pg (ref 26.0–34.0)
MCHC: 30.3 g/dL (ref 30.0–36.0)
MCV: 91.6 fL (ref 80.0–100.0)
Platelets: 198 10*3/uL (ref 150–400)
RBC: 3.35 MIL/uL — ABNORMAL LOW (ref 3.87–5.11)
RDW: 15.6 % — ABNORMAL HIGH (ref 11.5–15.5)
WBC: 10.9 10*3/uL — ABNORMAL HIGH (ref 4.0–10.5)
nRBC: 0 % (ref 0.0–0.2)

## 2023-07-22 MED ORDER — POTASSIUM CHLORIDE CRYS ER 20 MEQ PO TBCR
40.0000 meq | EXTENDED_RELEASE_TABLET | Freq: Once | ORAL | Status: AC
  Administered 2023-07-22: 40 meq via ORAL
  Filled 2023-07-22: qty 2

## 2023-07-22 NOTE — Progress Notes (Signed)
3 Days Post-Op Robotic LAR and colostomy reversal Subjective: Only complaint is pain at former ostomy site. Denies pain elsewhere. No emesis. Some occasional nausea. Passing flatus, having multiple Bms now. Notes some BRBPR with each BM   Objective: Vital signs in last 24 hours: Temp:  [98.6 F (37 C)-99.5 F (37.5 C)] 99.1 F (37.3 C) (09/16 1206) Pulse Rate:  [71-86] 74 (09/16 1206) Resp:  [16-20] 16 (09/16 1206) BP: (104-111)/(63-80) 105/69 (09/16 1206) SpO2:  [94 %-95 %] 95 % (09/16 1206)   Intake/Output from previous day: 09/15 0701 - 09/16 0700 In: 780 [P.O.:780] Out: 100 [Urine:100] Intake/Output this shift: Total I/O In: 240 [P.O.:240] Out: -    General appearance: alert and cooperative GI: soft, non-distended Incision: healing well, no significant drainage  Lab Results:  Recent Labs    07/21/23 0418 07/22/23 0452  WBC 11.5* 10.9*  HGB 9.2* 9.3*  HCT 30.2* 30.7*  PLT 193 198   BMET Recent Labs    07/21/23 0418 07/22/23 0452  NA 136 138  K 3.4* 3.4*  CL 104 105  CO2 26 25  GLUCOSE 93 87  BUN 13 11  CREATININE 0.95 0.95  CALCIUM 7.9* 8.1*   PT/INR No results for input(s): "LABPROT", "INR" in the last 72 hours. ABG No results for input(s): "PHART", "HCO3" in the last 72 hours.  Invalid input(s): "PCO2", "PO2"  MEDS, Scheduled  acetaminophen  1,000 mg Oral Q6H   feeding supplement  237 mL Oral BID BM   methocarbamol  1,000 mg Oral TID   potassium chloride  40 mEq Oral Once    Studies/Results: No results found.  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   S/P laparoscopic-assisted sigmoidectomy 10/12/2022   Medication monitoring encounter 08/09/2022    Polymyalgia rheumatica (HCC) 07/25/2022   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 07/25/2022   Malnutrition of moderate degree 07/17/2022   Abscess of sigmoid colon due to diverticulitis 07/11/2022   Hyperlipidemia    Chronic back pain    Hypertension    PVD (peripheral vascular disease) (HCC)    Chronic low back pain 05/25/2016   Rotator cuff tear 11/10/2015   Rupture of rotator cuff of shoulder 11/10/2015    Plan: Advance to soft diet, monitor Hypokalemia - Kcl ordered to replace Hgb stable now, notes increased blood in stool over last day, will d/c SQH but continue SCDs  Repeat labs in AM  SL IVF's Oxycodone and scheduled Robaxin added for better pain relief Ambulate in hallway    LOS: 3 days   Check [email or web address] for General Surgery coverage night/weekend/holidays  Page if acute issues. No secure chat available for me given surgeries/clinic/off post call which would lead to a delay in care.  Marin Olp, MD [facility], A [facility]  07/22/2023 3:16 PM

## 2023-07-23 LAB — BASIC METABOLIC PANEL
Anion gap: 8 (ref 5–15)
BUN: 10 mg/dL (ref 8–23)
CO2: 25 mmol/L (ref 22–32)
Calcium: 8.1 mg/dL — ABNORMAL LOW (ref 8.9–10.3)
Chloride: 105 mmol/L (ref 98–111)
Creatinine, Ser: 0.86 mg/dL (ref 0.44–1.00)
GFR, Estimated: >60 mL/min (ref >60)
Glucose, Bld: 94 mg/dL (ref 70–99)
Potassium: 3.8 mmol/L (ref 3.5–5.1)
Sodium: 138 mmol/L (ref 135–145)

## 2023-07-23 LAB — CBC WITH DIFFERENTIAL/PLATELET
Abs Immature Granulocytes: 0.03 10*3/uL (ref 0.00–0.07)
Basophils Absolute: 0 10*3/uL (ref 0.0–0.1)
Basophils Relative: 1 %
Eosinophils Absolute: 0.5 10*3/uL (ref 0.0–0.5)
Eosinophils Relative: 6 %
HCT: 31.1 % — ABNORMAL LOW (ref 36.0–46.0)
Hemoglobin: 9.5 g/dL — ABNORMAL LOW (ref 12.0–15.0)
Immature Granulocytes: 0 %
Lymphocytes Relative: 19 %
Lymphs Abs: 1.5 10*3/uL (ref 0.7–4.0)
MCH: 28.1 pg (ref 26.0–34.0)
MCHC: 30.5 g/dL (ref 30.0–36.0)
MCV: 92 fL (ref 80.0–100.0)
Monocytes Absolute: 0.8 10*3/uL (ref 0.1–1.0)
Monocytes Relative: 10 %
Neutro Abs: 5.3 10*3/uL (ref 1.7–7.7)
Neutrophils Relative %: 64 %
Platelets: 221 10*3/uL (ref 150–400)
RBC: 3.38 MIL/uL — ABNORMAL LOW (ref 3.87–5.11)
RDW: 15.5 % (ref 11.5–15.5)
WBC: 8.2 10*3/uL (ref 4.0–10.5)
nRBC: 0 % (ref 0.0–0.2)

## 2023-07-23 LAB — SURGICAL PATHOLOGY

## 2023-07-23 MED ORDER — CYCLOBENZAPRINE HCL 10 MG PO TABS: 5.0000 mg | ORAL_TABLET | Freq: Three times a day (TID) | ORAL | 0 refills | Status: DC | PRN

## 2023-07-23 NOTE — Progress Notes (Signed)
4 Days Post-Op Robotic LAR and colostomy reversal Subjective: Only complaint is pain at former ostomy site. Denies pain elsewhere. No emesis. Tolerating soft foods. Passing flatus, having multiple Bms now. No further bleeding since D/Cing SQH. Stools now more formed and have slowed down a lot since blood resolved.   Objective: Vital signs in last 24 hours: Temp:  [97.7 F (36.5 C)-99.1 F (37.3 C)] 97.7 F (36.5 C) (09/17 0636) Pulse Rate:  [70-80] 70 (09/17 0636) Resp:  [16-18] 18 (09/17 0636) BP: (105-137)/(69-77) 120/77 (09/17 0636) SpO2:  [95 %-96 %] 95 % (09/17 0636)   Intake/Output from previous day: 09/16 0701 - 09/17 0700 In: 360 [P.O.:360] Out: -  Intake/Output this shift: No intake/output data recorded.   General appearance: alert and cooperative GI: soft, non-distended Incision: healing well, no significant drainage  Lab Results:  Recent Labs    07/22/23 0452 07/23/23 0452  WBC 10.9* 8.2  HGB 9.3* 9.5*  HCT 30.7* 31.1*  PLT 198 221   BMET Recent Labs    07/22/23 0452 07/23/23 0452  NA 138 138  K 3.4* 3.8  CL 105 105  CO2 25 25  GLUCOSE 87 94  BUN 11 10  CREATININE 0.95 0.86  CALCIUM 8.1* 8.1*   PT/INR No results for input(s): "LABPROT", "INR" in the last 72 hours. ABG No results for input(s): "PHART", "HCO3" in the last 72 hours.  Invalid input(s): "PCO2", "PO2"  MEDS, Scheduled  acetaminophen  1,000 mg Oral Q6H   feeding supplement  237 mL Oral BID BM   methocarbamol  1,000 mg Oral TID    Studies/Results: No results found.  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   S/P laparoscopic-assisted sigmoidectomy 10/12/2022   Medication monitoring encounter  08/09/2022   Polymyalgia rheumatica (HCC) 07/25/2022   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 07/25/2022   Malnutrition of moderate degree 07/17/2022   Abscess of sigmoid colon due to diverticulitis 07/11/2022   Hyperlipidemia    Chronic back pain    Hypertension    PVD (peripheral vascular disease) (HCC)    Chronic low back pain 05/25/2016   Rotator cuff tear 11/10/2015   Rupture of rotator cuff of shoulder 11/10/2015    Plan: Tolerating soft diet Hgb stable  SL IVF's Oxycodone and scheduled Robaxin added for better pain relief Ambulate in hallway  She is comfortable with and stable for discharge home today   LOS: 4 days   Check [email or web address] for General Surgery coverage night/weekend/holidays  Page if acute issues. No secure chat available for me given surgeries/clinic/off post call which would lead to a delay in care.  Marin Olp, MD [facility], A [facility]  07/23/2023 7:35 AM

## 2023-07-23 NOTE — Progress Notes (Signed)
   07/23/23 0846  TOC Brief Assessment  Insurance and Status Reviewed  Patient has primary care physician Yes  Home environment has been reviewed Resides with spouse  Prior level of function: Independent at baseline  Prior/Current Home Services No current home services  Social Determinants of Health Reivew SDOH reviewed no interventions necessary  Readmission risk has been reviewed Yes  Transition of care needs no transition of care needs at this time

## 2023-07-23 NOTE — Progress Notes (Signed)
Reviewed written d/c instructions w pt and her husband. All questions answered and they both verbalized understanding. D/C via w/c w all belongings in stable condition.

## 2023-07-23 NOTE — Plan of Care (Signed)

## 2023-07-24 NOTE — Discharge Summary (Signed)
Patient ID: Jenny Glass MRN: [id number] DOB/AGE: [birth year] 72 y.o.  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   S/P laparoscopic-assisted sigmoidectomy 10/12/2022   Medication monitoring encounter 08/09/2022   Polymyalgia rheumatica (HCC) 07/25/2022   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 07/25/2022   Malnutrition of moderate degree 07/17/2022   Abscess of sigmoid colon due to diverticulitis 07/11/2022   Hyperlipidemia    Chronic back pain    Hypertension    PVD (peripheral vascular disease) (HCC)    Chronic low back pain 05/25/2016   Rotator cuff tear 11/10/2015   Rupture of rotator cuff of shoulder 11/10/2015    Consultants None  Procedures OR 07/19/23  Robotic assisted low anterior resection Robotic takedown of end colostomy Diagnostic flexible sigmoidoscopy (necessary to assess rectal stump and apparent stricture) Intraoperative assessment of perfusion using ICG fluorescence imaging Bilateral transversus abdominus plane (TAP) blocks   Hospital Course: Admitted postoperatively where she recovered well. She began having spontaneous bowel function and her diet was advanced.  She ultimately had tolerated this well but did have some bloating which delayed progression on the diet.  On 07/23/2023, she is tolerating diet, having bowel function, mobilizing well on her own, pain well-controlled.  She is comfortable with and stable for discharge home.  Postoperative expectations reviewed.  All of her questions were answered and she expressed understanding.  Follow-up in our office has been arranged.    Allergies as of 07/23/2023   No Known Allergies      Medication List     TAKE these medications    CALCIUM 600 + D PO Take 1 tablet by mouth daily.   cyclobenzaprine 10 MG tablet Commonly known as: FLEXERIL Take 0.5 tablets  (5 mg total) by mouth 3 (three) times daily as needed for muscle spasms.   predniSONE 1 MG tablet Commonly known as: DELTASONE Take 2 mg by mouth daily.   PRESCRIPTION MEDICATION Infusion every eight weeks   traMADol 50 MG tablet Commonly known as: Ultram Take 1 tablet (50 mg total) by mouth every 6 (six) hours as needed for up to 5 days (postop pain not controlled with tylenol/ibuprofen first).          Follow-up Information     Andria Meuse, MD Follow up on 08/14/2023.   Specialties: General Surgery, Colon and Rectal Surgery Why: Please arrive by 3:50 pm Contact information: [address] [place] [address] [phone]                 Stephanie Coup. Cliffton Asters, M.D. [facility]

## 2023-08-08 DIAGNOSIS — M0609 Rheumatoid arthritis without rheumatoid factor, multiple sites: Secondary | ICD-10-CM | POA: Diagnosis not present

## 2023-08-08 DIAGNOSIS — Z111 Encounter for screening for respiratory tuberculosis: Secondary | ICD-10-CM | POA: Diagnosis not present

## 2023-08-08 DIAGNOSIS — Z79899 Other long term (current) drug therapy: Secondary | ICD-10-CM | POA: Diagnosis not present

## 2023-08-08 DIAGNOSIS — R5383 Other fatigue: Secondary | ICD-10-CM | POA: Diagnosis not present

## 2023-08-19 DIAGNOSIS — Z23 Encounter for immunization: Secondary | ICD-10-CM | POA: Diagnosis not present

## 2023-09-03 DIAGNOSIS — Z79899 Other long term (current) drug therapy: Secondary | ICD-10-CM | POA: Diagnosis not present

## 2023-09-03 DIAGNOSIS — M1991 Primary osteoarthritis, unspecified site: Secondary | ICD-10-CM | POA: Diagnosis not present

## 2023-09-03 DIAGNOSIS — Z6826 Body mass index (BMI) 26.0-26.9, adult: Secondary | ICD-10-CM | POA: Diagnosis not present

## 2023-09-03 DIAGNOSIS — M0609 Rheumatoid arthritis without rheumatoid factor, multiple sites: Secondary | ICD-10-CM | POA: Diagnosis not present

## 2023-09-03 DIAGNOSIS — E663 Overweight: Secondary | ICD-10-CM | POA: Diagnosis not present

## 2023-09-03 DIAGNOSIS — M48061 Spinal stenosis, lumbar region without neurogenic claudication: Secondary | ICD-10-CM | POA: Diagnosis not present

## 2023-09-03 DIAGNOSIS — M791 Myalgia, unspecified site: Secondary | ICD-10-CM | POA: Diagnosis not present

## 2023-10-07 DIAGNOSIS — M0609 Rheumatoid arthritis without rheumatoid factor, multiple sites: Secondary | ICD-10-CM | POA: Diagnosis not present

## 2023-10-07 DIAGNOSIS — Z79899 Other long term (current) drug therapy: Secondary | ICD-10-CM | POA: Diagnosis not present

## 2023-12-02 DIAGNOSIS — M0609 Rheumatoid arthritis without rheumatoid factor, multiple sites: Secondary | ICD-10-CM | POA: Diagnosis not present

## 2024-01-07 ENCOUNTER — Other Ambulatory Visit: Payer: Self-pay | Admitting: Internal Medicine

## 2024-01-07 DIAGNOSIS — Z1231 Encounter for screening mammogram for malignant neoplasm of breast: Secondary | ICD-10-CM

## 2024-01-16 DIAGNOSIS — H04123 Dry eye syndrome of bilateral lacrimal glands: Secondary | ICD-10-CM | POA: Diagnosis not present

## 2024-01-16 DIAGNOSIS — H2513 Age-related nuclear cataract, bilateral: Secondary | ICD-10-CM | POA: Diagnosis not present

## 2024-01-22 ENCOUNTER — Other Ambulatory Visit: Payer: Self-pay | Admitting: Surgery

## 2024-01-22 DIAGNOSIS — R1012 Left upper quadrant pain: Secondary | ICD-10-CM | POA: Diagnosis not present

## 2024-01-22 DIAGNOSIS — Z9889 Other specified postprocedural states: Secondary | ICD-10-CM | POA: Diagnosis not present

## 2024-01-27 DIAGNOSIS — M0609 Rheumatoid arthritis without rheumatoid factor, multiple sites: Secondary | ICD-10-CM | POA: Diagnosis not present

## 2024-01-30 ENCOUNTER — Ambulatory Visit
Admission: RE | Admit: 2024-01-30 | Discharge: 2024-01-30 | Disposition: A | Discharge status: Home / Self Care | Source: Ambulatory Visit | Attending: Surgery

## 2024-01-30 DIAGNOSIS — R1012 Left upper quadrant pain: Secondary | ICD-10-CM | POA: Diagnosis not present

## 2024-01-30 DIAGNOSIS — Z9889 Other specified postprocedural states: Secondary | ICD-10-CM

## 2024-01-30 DIAGNOSIS — Z9049 Acquired absence of other specified parts of digestive tract: Secondary | ICD-10-CM | POA: Diagnosis not present

## 2024-01-30 DIAGNOSIS — K449 Diaphragmatic hernia without obstruction or gangrene: Secondary | ICD-10-CM | POA: Diagnosis not present

## 2024-01-30 MED ORDER — IOPAMIDOL (ISOVUE-300) INJECTION 61%
100.0000 mL | Freq: Once | INTRAVENOUS | Status: AC | PRN
  Administered 2024-01-30: 100 mL via INTRAVENOUS

## 2024-02-17 ENCOUNTER — Ambulatory Visit
Admission: RE | Admit: 2024-02-17 | Discharge: 2024-02-17 | Disposition: A | Discharge status: Home / Self Care | Source: Ambulatory Visit | Attending: Internal Medicine | Admitting: Internal Medicine

## 2024-02-17 DIAGNOSIS — Z1231 Encounter for screening mammogram for malignant neoplasm of breast: Secondary | ICD-10-CM

## 2024-02-26 DIAGNOSIS — Z9889 Other specified postprocedural states: Secondary | ICD-10-CM | POA: Diagnosis not present

## 2024-02-26 DIAGNOSIS — R1012 Left upper quadrant pain: Secondary | ICD-10-CM | POA: Diagnosis not present

## 2024-02-26 DIAGNOSIS — K432 Incisional hernia without obstruction or gangrene: Secondary | ICD-10-CM | POA: Diagnosis not present

## 2024-03-04 DIAGNOSIS — K449 Diaphragmatic hernia without obstruction or gangrene: Secondary | ICD-10-CM | POA: Diagnosis not present

## 2024-03-04 DIAGNOSIS — K432 Incisional hernia without obstruction or gangrene: Secondary | ICD-10-CM | POA: Diagnosis not present

## 2024-03-09 NOTE — Progress Notes (Signed)
 Surgery orders requested via Epic inbox.

## 2024-03-10 ENCOUNTER — Ambulatory Visit: Payer: Self-pay | Admitting: Surgery

## 2024-03-10 IMAGING — MG MM DIGITAL SCREENING BILAT W/ TOMO AND CAD
8 series · 8 of 24 positions shown · non-contrast
Comparison: Previous exam(s).

CLINICAL DATA: Screening.

EXAM:
DIGITAL SCREENING BILATERAL MAMMOGRAM WITH TOMOSYNTHESIS AND CAD
TECHNIQUE: Bilateral screening digital craniocaudal and mediolateral oblique
mammograms were obtained. Bilateral screening digital breast
tomosynthesis was performed. The images were evaluated with
computer-aided detection.

[L MLO synth-2D]
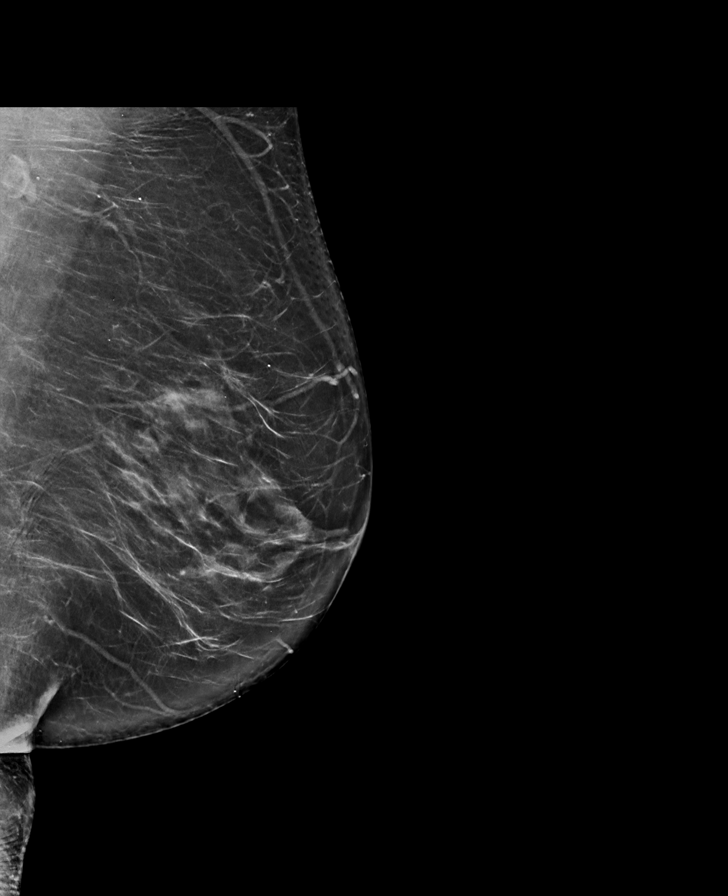

[R CC synth-2D]
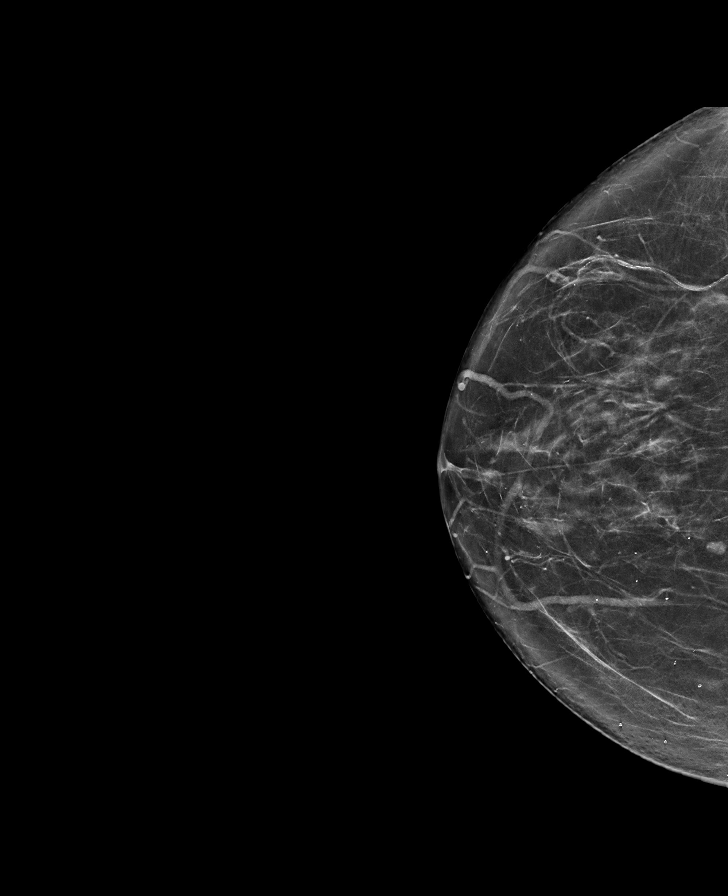

[L CC synth-2D]
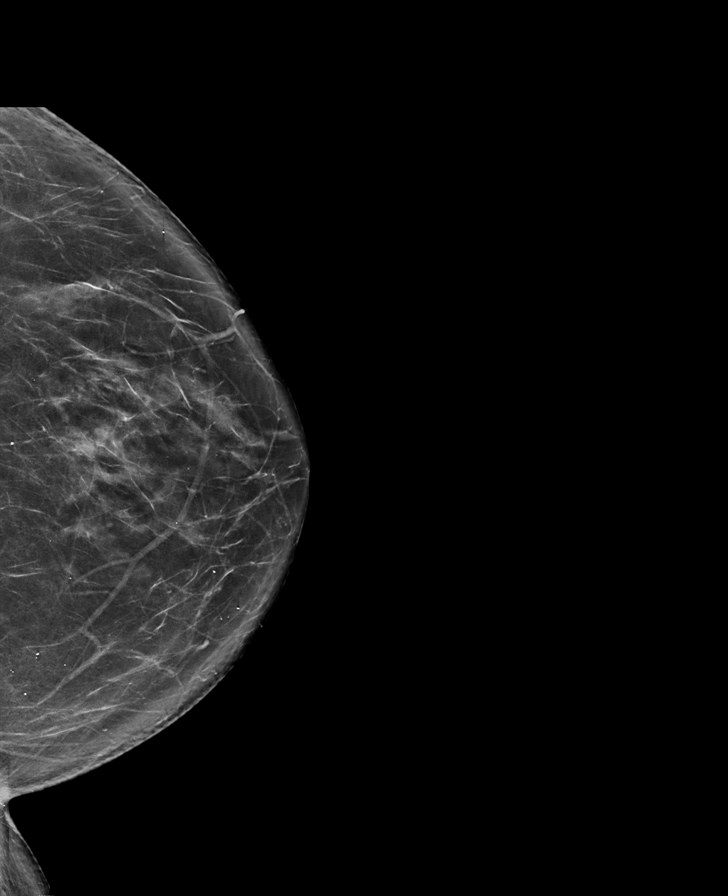

[R MLO synth-2D]
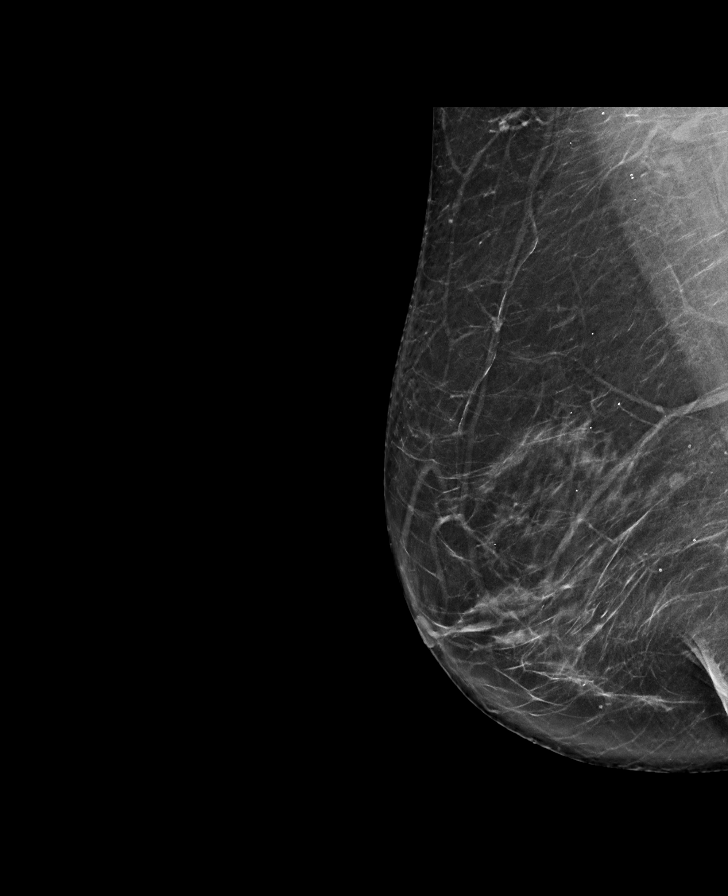

[R MLO tomo · tomo slice 42/83.0]
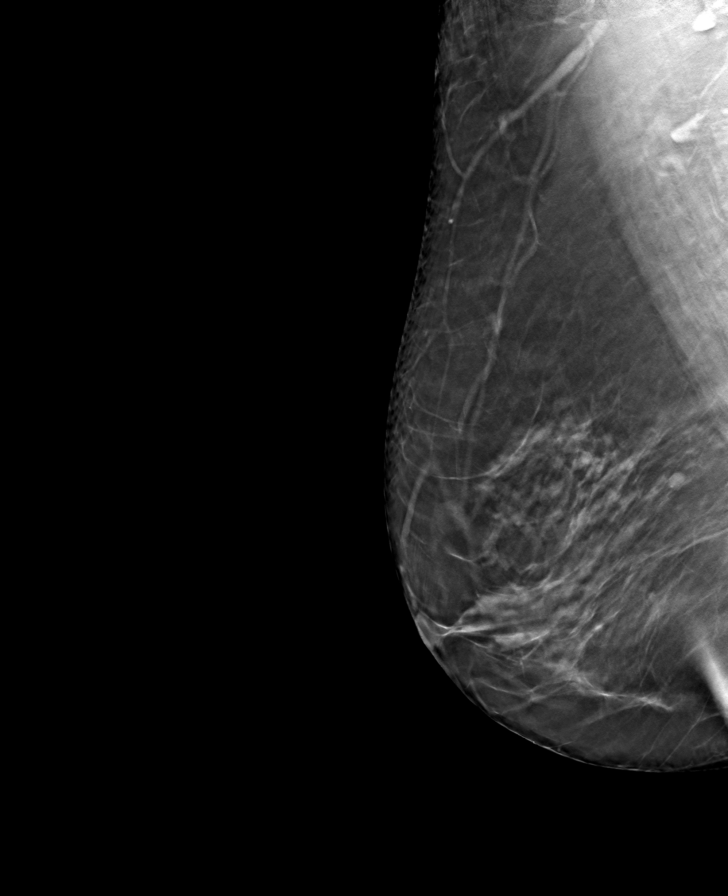

[R CC tomo · tomo slice 40/79.0]
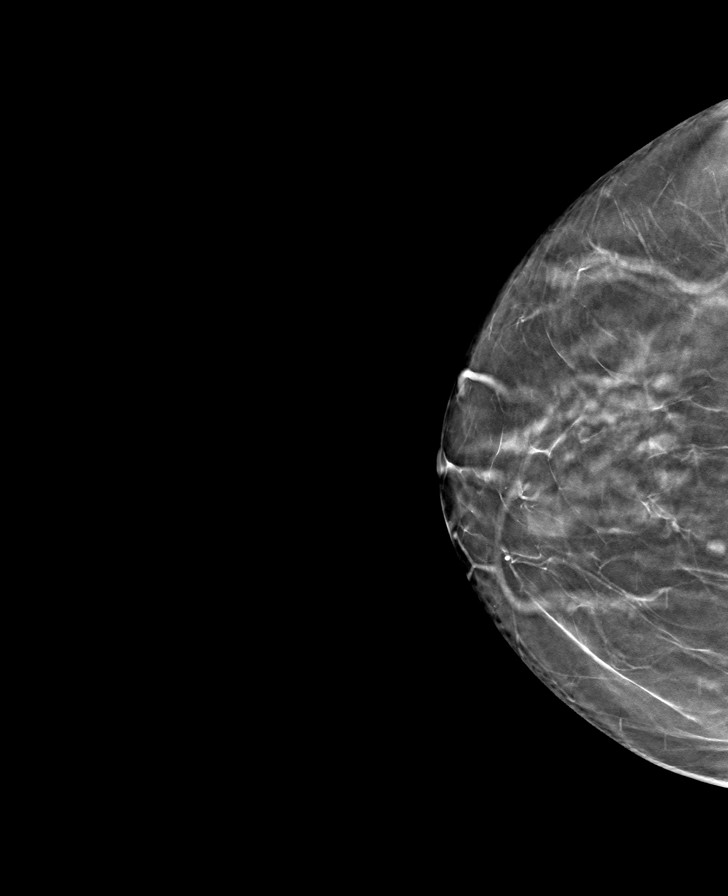

[L MLO tomo · tomo slice 43/85.0]
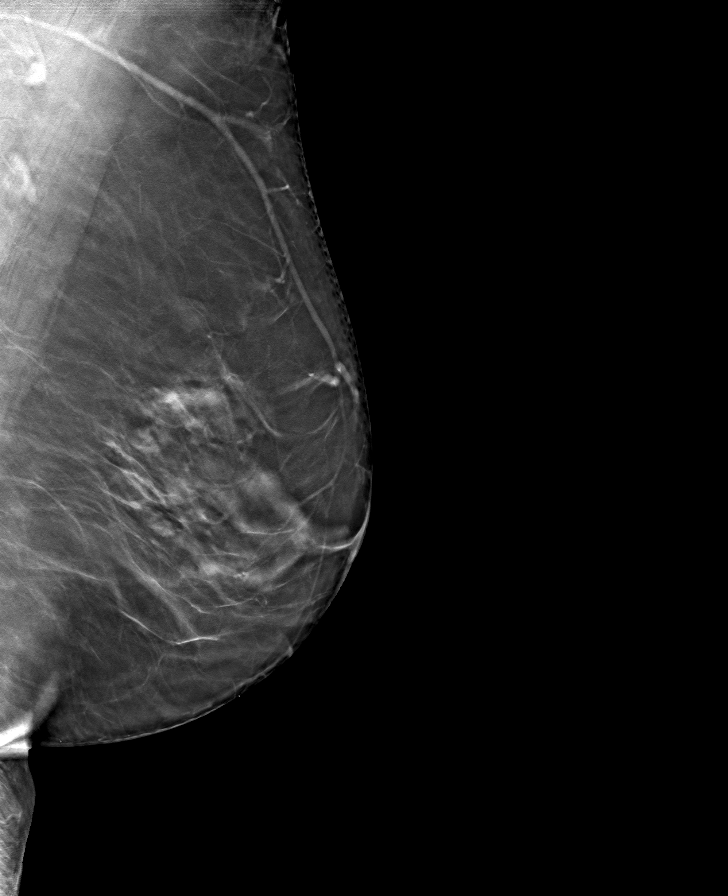

[L CC tomo · tomo slice 42/83.0]
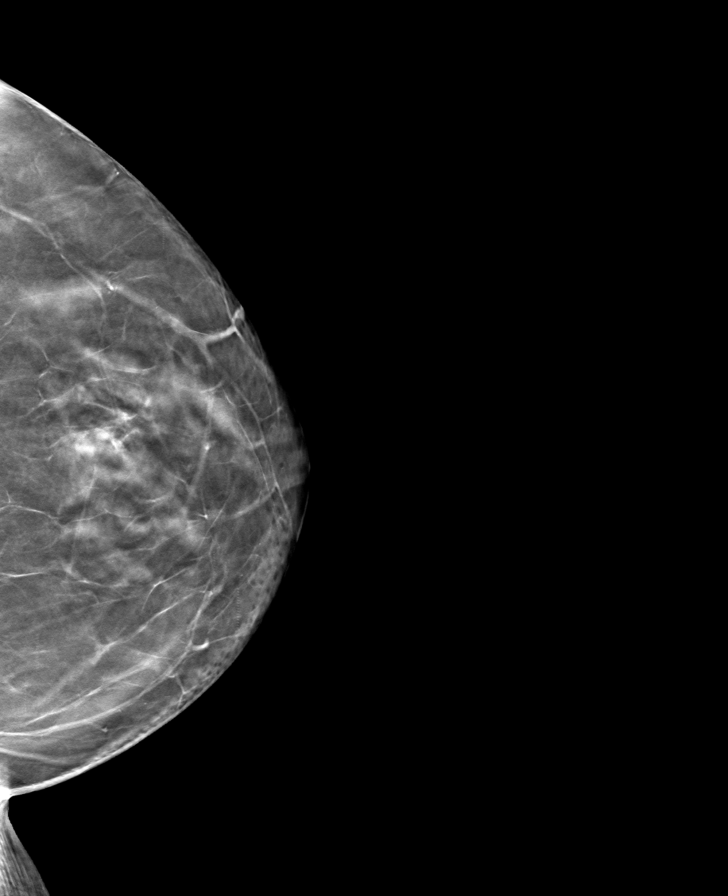

[8 of 24 positions shown; findings below may reference images not displayed]

ACR Breast Density Category b: There are scattered areas of
fibroglandular density.
FINDINGS: There are no findings suspicious for malignancy.
IMPRESSION: No mammographic evidence of malignancy. A result letter of this
screening mammogram will be mailed directly to the patient.

RECOMMENDATION:
Screening mammogram in one year. (Code:51-O-LD2)

BI-RADS CATEGORY  1: Negative.

## 2024-03-10 NOTE — Patient Instructions (Signed)
 SURGICAL WAITING ROOM VISITATION Patients having surgery or a procedure may have no more than 2 support people in the waiting area - these visitors may rotate in the visitor waiting room.   If the patient needs to stay at the hospital during part of their recovery, the visitor guidelines for inpatient rooms apply.  PRE-OP VISITATION  Pre-op nurse will coordinate an appropriate time for 1 support person to accompany the patient in pre-op.  This support person may not rotate.  This visitor will be contacted when the time is appropriate for the visitor to come back in the pre-op area.  Please refer to the [facility] website for the visitor guidelines for Inpatients (after your surgery is over and you are in a regular room).  You are not required to quarantine at this time prior to your surgery. However, you must do this: Hand Hygiene often Do NOT share personal items Notify your provider if you are in close contact with someone who has COVID or you develop fever 100.4 or greater, new onset of sneezing, cough, sore throat, shortness of breath or body aches.  If you test positive for Covid or have been in contact with anyone that has tested positive in the last 10 days please notify you surgeon.    Your procedure is scheduled on:  Monday  Mar 16, 2024  Report to [facility] Main Entrance: [facility] entrance where the [employer] is available.   Report to admitting at:09:45  AM  Call this number if you have any questions or problems the morning of surgery [phone]  FOLLOW ANY ADDITIONAL PRE OP INSTRUCTIONS YOU RECEIVED FROM YOUR SURGEON'S OFFICE!!!  Do not eat food after Midnight the night prior to your surgery/procedure.  After Midnight you may have the following liquids until 09:00  AM  DAY OF SURGERY  Clear Liquid Diet Water  Black Coffee (sugar ok, NO MILK/CREAM OR CREAMERS)  Tea (sugar ok, NO MILK/CREAM OR CREAMERS) regular and decaf                             Plain  Jell-O  with no fruit (NO RED)                                           Fruit ices (not with fruit pulp, NO RED)                                     Popsicles (NO RED)                                                                  Juice: NO CITRUS JUICES: only apple, WHITE grape, WHITE cranberry Sports drinks like Gatorade or Powerade (NO RED)                Oral Hygiene is also important to reduce your risk of infection.        Remember - BRUSH YOUR TEETH THE MORNING OF SURGERY WITH YOUR REGULAR TOOTHPASTE  Do NOT smoke after Midnight  the night before surgery.  STOP TAKING all Vitamins, Herbs and supplements 1 week before your surgery.   Take ONLY these medicines the morning of surgery with A SIP OF WATER : Pantoprazole. Predinsone.                    You may not have any metal on your body including hair pins, jewelry, and body piercing  Do not wear make-up, lotions, powders, perfumes or deodorant  Do not wear nail polish including gel and S&S, artificial / acrylic nails, or any other type of covering on natural nails including finger and toenails. If you have artificial nails, gel coating, etc., that needs to be removed by a nail salon, Please have this removed prior to surgery. Not doing so may mean that your surgery could be cancelled or delayed if the Surgeon or anesthesia staff feels like they are unable to monitor you safely.   Do not shave 48 hours prior to surgery to avoid nicks in your skin which may contribute to postoperative infections.    Contacts, Hearing Aids, dentures or bridgework may not be worn into surgery. DENTURES WILL BE REMOVED PRIOR TO SURGERY PLEASE DO NOT APPLY "Poly grip" OR ADHESIVES!!!  Patients discharged on the day of surgery will not be allowed to drive home.  Someone NEEDS to stay with you for the first 24 hours after anesthesia.  Do not bring your home medications to the hospital. The Pharmacy will dispense medications listed on your medication list  to you during your admission in the Hospital.  Please read over the following fact sheets you were given: IF YOU HAVE QUESTIONS ABOUT YOUR PRE-OP INSTRUCTIONS, PLEASE CALL [phone]   [facility] - Preparing for Surgery Before surgery, you can play an important role.  Because skin is not sterile, your skin needs to be as free of germs as possible.  You can reduce the number of germs on your skin by washing with CHG (chlorahexidine gluconate) soap before surgery.  CHG is an antiseptic cleaner which kills germs and bonds with the skin to continue killing germs even after washing. Please DO NOT use if you have an allergy to CHG or antibacterial soaps.  If your skin becomes reddened/irritated stop using the CHG and inform your nurse when you arrive at Short Stay. Do not shave (including legs and underarms) for at least 48 hours prior to the first CHG shower.  You may shave your face/neck.  Please follow these instructions carefully:  1.  Shower with CHG Soap the night before surgery and the  morning of surgery.  2.  If you choose to wash your hair, wash your hair first as usual with your normal  shampoo.  3.  After you shampoo, rinse your hair and body thoroughly to remove the shampoo.                             4.  Use CHG as you would any other liquid soap.  You can apply chg directly to the skin and wash.  Gently with a scrungie or clean washcloth.  5.  Apply the CHG Soap to your body ONLY FROM THE NECK DOWN.   Do not use on face/ open                           Wound or open sores. Avoid contact with eyes, ears mouth and  genitals (private parts).                       Wash face,  Genitals (private parts) with your normal soap.             6.  Wash thoroughly, paying special attention to the area where your  surgery  will be performed.  7.  Thoroughly rinse your body with warm water  from the neck down.  8.  DO NOT shower/wash with your normal soap after using and rinsing off the CHG Soap.             9.  Pat yourself dry with a clean towel.            10.  Wear clean pajamas.            11.  Place clean sheets on your bed the night of your first shower and do not  sleep with pets.  ON THE DAY OF SURGERY : Do not apply any lotions/deodorants the morning of surgery.  Please wear clean clothes to the hospital/surgery center.     FAILURE TO FOLLOW THESE INSTRUCTIONS MAY RESULT IN THE CANCELLATION OF YOUR SURGERY  PATIENT SIGNATURE_________________________________  NURSE SIGNATURE__________________________________  ________________________________________________________________________

## 2024-03-10 NOTE — Progress Notes (Signed)
 COVID Vaccine received:  []  No [x]  Yes Date of any COVID positive Test in last 90 days:    PCP - Virgle Grime, MD  [phone] (Work)  [phone] (Fax)  Cardiologist - Olinda Bertrand, DO  Infectious Disease-  Terre Ferri, MD Rheumatology- Stefan Edge, MD      Chest x-ray - 1v on 07-13-2022  epic EKG -  07-10-2023 epic Stress Test - 05-17-2022 ECHO - 05-23-2022 epic Cardiac Cath -     Bowel Prep - []  No  []   Yes __   Pacemaker / ICD device [x]  No []  Yes        Device order form faxed [x]  No    []   Yes      Faxed to:   History of Sleep Apnea? [x]  No []  Yes   CPAP used?- [x]  No []  Yes     Does the patient monitor blood sugar? []  No []  Yes  [x]  N/A   Blood Thinner / Instructions:none Aspirin  Instructions:  ERAS Protocol Ordered: []  No  [x]  Yes PRE-SURGERY []  ENSURE  []  G2   [x]  No Drink Ordered Patient is to be NPO after: 0900  Dental hx: []  Dentures:  []  N/A      []  Bridge or Partial:                   []  Loose or Damaged teeth:   Comments:   Activity level: Patient is able / unable to climb a flight of stairs without difficulty; []  No CP  []  No SOB, but would have ___   Patient can / can not perform ADLs without assistance.   Anesthesia review: HTN, RA, carotid stenosis, fibromyalgia, Pre-DM no meds.  Patient denies shortness of breath, fever, cough and chest pain at PAT appointment.  Patient verbalized understanding and agreement to the Pre-Surgical Instructions that were given to them at this PAT appointment. Patient was also educated of the need to review these PAT instructions again prior to her surgery.I reviewed the appropriate phone numbers to call if they have any and questions or concerns.

## 2024-03-11 ENCOUNTER — Other Ambulatory Visit: Payer: Self-pay

## 2024-03-11 ENCOUNTER — Encounter (HOSPITAL_COMMUNITY): Payer: Self-pay

## 2024-03-11 ENCOUNTER — Encounter (HOSPITAL_COMMUNITY)
Admission: RE | Admit: 2024-03-11 | Discharge: 2024-03-11 | Disposition: A | Discharge status: Home / Self Care | Source: Ambulatory Visit | Attending: Surgery | Admitting: Surgery

## 2024-03-11 VITALS — BP 138/88 | HR 64 | Temp 97.8°F | Resp 16 | Ht 63.0 in | Wt 160.0 lb

## 2024-03-11 DIAGNOSIS — Z79899 Other long term (current) drug therapy: Secondary | ICD-10-CM | POA: Diagnosis not present

## 2024-03-11 DIAGNOSIS — M353 Polymyalgia rheumatica: Secondary | ICD-10-CM | POA: Diagnosis not present

## 2024-03-11 DIAGNOSIS — Z01812 Encounter for preprocedural laboratory examination: Secondary | ICD-10-CM | POA: Diagnosis not present

## 2024-03-11 DIAGNOSIS — R7303 Prediabetes: Secondary | ICD-10-CM

## 2024-03-11 DIAGNOSIS — I10 Essential (primary) hypertension: Secondary | ICD-10-CM | POA: Insufficient documentation

## 2024-03-11 DIAGNOSIS — Z01818 Encounter for other preprocedural examination: Secondary | ICD-10-CM

## 2024-03-11 LAB — COMPREHENSIVE METABOLIC PANEL WITH GFR
ALT: 11 U/L (ref 0–44)
AST: 14 U/L — ABNORMAL LOW (ref 15–41)
Albumin: 4 g/dL (ref 3.5–5.0)
Alkaline Phosphatase: 61 U/L (ref 38–126)
Anion gap: 9 (ref 5–15)
BUN: 12 mg/dL (ref 8–23)
CO2: 26 mmol/L (ref 22–32)
Calcium: 9.3 mg/dL (ref 8.9–10.3)
Chloride: 106 mmol/L (ref 98–111)
Creatinine, Ser: 0.94 mg/dL (ref 0.44–1.00)
GFR, Estimated: >60 mL/min (ref >60)
Glucose, Bld: 103 mg/dL — ABNORMAL HIGH (ref 70–99)
Potassium: 4.2 mmol/L (ref 3.5–5.1)
Sodium: 141 mmol/L (ref 135–145)
Total Bilirubin: 0.4 mg/dL (ref 0.0–1.2)
Total Protein: 7.3 g/dL (ref 6.5–8.1)

## 2024-03-11 LAB — CBC
HCT: 39.3 % (ref 36.0–46.0)
Hemoglobin: 12.2 g/dL (ref 12.0–15.0)
MCH: 28.8 pg (ref 26.0–34.0)
MCHC: 31 g/dL (ref 30.0–36.0)
MCV: 92.7 fL (ref 80.0–100.0)
Platelets: 240 10*3/uL (ref 150–400)
RBC: 4.24 MIL/uL (ref 3.87–5.11)
RDW: 14.9 % (ref 11.5–15.5)
WBC: 8.3 10*3/uL (ref 4.0–10.5)
nRBC: 0 % (ref 0.0–0.2)

## 2024-03-16 ENCOUNTER — Encounter (HOSPITAL_COMMUNITY): Payer: Self-pay | Admitting: Anesthesiology

## 2024-03-16 ENCOUNTER — Ambulatory Visit (HOSPITAL_COMMUNITY): Admission: RE | Admit: 2024-03-16 | Source: Home / Self Care | Admitting: Surgery

## 2024-03-16 ENCOUNTER — Encounter (HOSPITAL_COMMUNITY): Admission: RE | Payer: Self-pay | Source: Home / Self Care

## 2024-03-16 SURGERY — REPAIR, HERNIA, INCISIONAL, LAPAROSCOPIC
Anesthesia: General

## 2024-03-16 MED ORDER — DEXAMETHASONE SODIUM PHOSPHATE 10 MG/ML IJ SOLN
INTRAMUSCULAR | Status: AC
  Filled 2024-03-16: qty 1

## 2024-03-16 MED ORDER — FENTANYL CITRATE (PF) 100 MCG/2ML IJ SOLN
INTRAMUSCULAR | Status: AC
Start: 2024-03-16 — End: ?
  Filled 2024-03-16: qty 2

## 2024-03-16 MED ORDER — MIDAZOLAM HCL 2 MG/2ML IJ SOLN
INTRAMUSCULAR | Status: AC
  Filled 2024-03-16: qty 2

## 2024-03-16 MED ORDER — ROCURONIUM BROMIDE 10 MG/ML (PF) SYRINGE
PREFILLED_SYRINGE | INTRAVENOUS | Status: AC
  Filled 2024-03-16: qty 10

## 2024-03-16 MED ORDER — LIDOCAINE HCL (PF) 2 % IJ SOLN
INTRAMUSCULAR | Status: AC
  Filled 2024-03-16: qty 5

## 2024-03-16 MED ORDER — PROPOFOL 10 MG/ML IV BOLUS
INTRAVENOUS | Status: AC
  Filled 2024-03-16: qty 20

## 2024-03-16 MED ORDER — ONDANSETRON HCL 4 MG/2ML IJ SOLN
INTRAMUSCULAR | Status: AC
  Filled 2024-03-16: qty 2

## 2024-03-16 NOTE — Progress Notes (Signed)
 Patient stated that her husband is in the ICU now due to heart problems and will to cancel her surgery today. Dr Marny Sires is aware.

## 2024-03-23 DIAGNOSIS — M0609 Rheumatoid arthritis without rheumatoid factor, multiple sites: Secondary | ICD-10-CM | POA: Diagnosis not present

## 2024-03-23 DIAGNOSIS — Z79899 Other long term (current) drug therapy: Secondary | ICD-10-CM | POA: Diagnosis not present

## 2024-03-27 DIAGNOSIS — M48061 Spinal stenosis, lumbar region without neurogenic claudication: Secondary | ICD-10-CM | POA: Diagnosis not present

## 2024-03-27 DIAGNOSIS — Z79899 Other long term (current) drug therapy: Secondary | ICD-10-CM | POA: Diagnosis not present

## 2024-03-27 DIAGNOSIS — Z6828 Body mass index (BMI) 28.0-28.9, adult: Secondary | ICD-10-CM | POA: Diagnosis not present

## 2024-03-27 DIAGNOSIS — M1991 Primary osteoarthritis, unspecified site: Secondary | ICD-10-CM | POA: Diagnosis not present

## 2024-03-27 DIAGNOSIS — M0609 Rheumatoid arthritis without rheumatoid factor, multiple sites: Secondary | ICD-10-CM | POA: Diagnosis not present

## 2024-03-27 DIAGNOSIS — E663 Overweight: Secondary | ICD-10-CM | POA: Diagnosis not present

## 2024-04-09 NOTE — Patient Instructions (Signed)
 SURGICAL WAITING ROOM VISITATION  Patients having surgery or a procedure may have no more than 2 support people in the waiting area - these visitors may rotate.    Children under the age of 23 must have an adult with them who is not the patient.  Visitors with respiratory illnesses are discouraged from visiting and should remain at home.  If the patient needs to stay at the hospital during part of their recovery, the visitor guidelines for inpatient rooms apply. Pre-op nurse will coordinate an appropriate time for 1 support person to accompany patient in pre-op.  This support person may not rotate.    Please refer to the [facility] website for the visitor guidelines for Inpatients (after your surgery is over and you are in a regular room).       Your procedure is scheduled on: 04/14/24   Report to [facility] Main Entrance    Report to admitting at 5:15 AM   Call this number if you have problems the morning of surgery [phone]   Do not eat food :After Midnight.   After Midnight you may have the following liquids until 4:30 AM DAY OF SURGERY  Water  Non-Citrus Juices (without pulp, NO RED-Apple, White grape, White cranberry) Black Coffee (NO MILK/CREAM OR CREAMERS, sugar ok)  Clear Tea (NO MILK/CREAM OR CREAMERS, sugar ok) regular and decaf                             Plain Jell-O (NO RED)                                           Fruit ices (not with fruit pulp, NO RED)                                     Popsicles (NO RED)                                                               Sports drinks like Gatorade (NO RED)                 FOLLOW BOWEL PREP AND ANY ADDITIONAL PRE OP INSTRUCTIONS YOU RECEIVED FROM YOUR SURGEON'S OFFICE!!!     Oral Hygiene is also important to reduce your risk of infection.                                    Remember - BRUSH YOUR TEETH THE MORNING OF SURGERY WITH YOUR REGULAR TOOTHPASTE  DENTURES WILL BE REMOVED PRIOR TO SURGERY  PLEASE DO NOT APPLY "Poly grip" OR ADHESIVES!!!   Stop all vitamins and herbal supplements 7 days before surgery.   Take these medicines the morning of surgery with A SIP OF WATER : Pantoprazole, Prednisone , tylenol  if needed.  DO NOT TAKE ANY ORAL DIABETIC MEDICATIONS DAY OF YOUR SURGERY             You may not have any metal on your body including hair  pins, jewelry, and body piercing             Do not wear make-up, lotions, powders, perfumes/cologne, or deodorant  Do not wear nail polish including gel and S&S, artificial/acrylic nails, or any other type of covering on natural nails including finger and toenails. If you have artificial nails, gel coating, etc. that needs to be removed by a nail salon please have this removed prior to surgery or surgery may need to be canceled/ delayed if the surgeon/ anesthesia feels like they are unable to be safely monitored.   Do not shave  48 hours prior to surgery.    Do not bring valuables to the hospital. [facility] IS NOT             RESPONSIBLE   FOR VALUABLES.   Contacts, glasses, dentures or bridgework may not be worn into surgery.   Bring small overnight bag day of surgery.   DO NOT BRING YOUR HOME MEDICATIONS TO THE HOSPITAL. PHARMACY WILL DISPENSE MEDICATIONS LISTED ON YOUR MEDICATION LIST TO YOU DURING YOUR ADMISSION IN THE HOSPITAL!    Patients discharged on the day of surgery will not be allowed to drive home.  Someone NEEDS to stay with you for the first 24 hours after anesthesia.   Special Instructions: Bring a copy of your healthcare power of attorney and living will documents the day of surgery if you haven't scanned them before.              Please read over the following fact sheets you were given: IF YOU HAVE QUESTIONS ABOUT YOUR PRE-OP INSTRUCTIONS PLEASE CALL [phone] Jenny Glass   If you received a COVID test during your pre-op visit  it is requested that you wear a mask when out in public, stay away from anyone that may  not be feeling well and notify your surgeon if you develop symptoms. If you test positive for Covid or have been in contact with anyone that has tested positive in the last 10 days please notify you surgeon.    [place] - Preparing for Surgery Before surgery, you can play an important role.  Because skin is not sterile, your skin needs to be as free of germs as possible.  You can reduce the number of germs on your skin by washing with CHG (chlorahexidine gluconate) soap before surgery.  CHG is an antiseptic cleaner which kills germs and bonds with the skin to continue killing germs even after washing. Please DO NOT use if you have an allergy to CHG or antibacterial soaps.  If your skin becomes reddened/irritated stop using the CHG and inform your nurse when you arrive at Short Stay. Do not shave (including legs and underarms) for at least 48 hours prior to the first CHG shower.  You may shave your face/neck.  Please follow these instructions carefully:  1.  Shower with CHG Soap the night before surgery and the  morning of surgery.  2.  If you choose to wash your hair, wash your hair first as usual with your normal  shampoo.  3.  After you shampoo, rinse your hair and body thoroughly to remove the shampoo.                             4.  Use CHG as you would any other liquid soap.  You can apply chg directly to the skin and wash.  Gently with a scrungie or  clean washcloth.  5.  Apply the CHG Soap to your body ONLY FROM THE NECK DOWN.   Do   not use on face/ open                           Wound or open sores. Avoid contact with eyes, ears mouth and   genitals (private parts).                       Wash face,  Genitals (private parts) with your normal soap.             6.  Wash thoroughly, paying special attention to the area where your    surgery  will be performed.  7.  Thoroughly rinse your body with warm water  from the neck down.  8.  DO NOT shower/wash with your normal soap after using and  rinsing off the CHG Soap.                9.  Pat yourself dry with a clean towel.            10.  Wear clean pajamas.            11.  Place clean sheets on your bed the night of your first shower and do not  sleep with pets. Day of Surgery : Do not apply any lotions/deodorants the morning of surgery.  Please wear clean clothes to the hospital/surgery center.  FAILURE TO FOLLOW THESE INSTRUCTIONS MAY RESULT IN THE CANCELLATION OF YOUR SURGERY  PATIENT SIGNATURE_________________________________  NURSE SIGNATURE__________________________________  ________________________________________________________________________

## 2024-04-09 NOTE — Progress Notes (Signed)
 COVID Vaccine received:  []  No [x]  Yes Date of any COVID positive Test in last 90 days: no PCP - Virgle Grime MD Cardiologist - n/a  Chest x-ray -  EKG -  07/10/23 Epic Stress Test - 05/17/22 Epic ECHO - 05/23/22 Epic Cardiac Cath -   Bowel Prep - [x]  No  []   Yes ______  Pacemaker / ICD device [x]  No []  Yes   Spinal Cord Stimulator:[x]  No []  Yes       History of Sleep Apnea? [x]  No []  Yes   CPAP used?- [x]  No []  Yes    Does the patient monitor blood sugar?          [x]  No []  Yes  []  N/A  Patient has: [x]  NO Hx DM   []  Pre-DM                 []  DM1  []   DM2 Does patient have a [employer] or [employer]? []  No []  Yes   Fasting Blood Sugar Ranges-  Checks Blood Sugar _____ times a day  GLP1 agonist / usual dose - no GLP1 instructions:  SGLT-2 inhibitors / usual dose - no SGLT-2 instructions:   Blood Thinner / Instructions:no Aspirin  Instructions:no  Comments:   Activity level: Patient is able  to climb a flight of stairs without difficulty; [x]  No CP  [x]  No SOB,  Patient can /perform ADLs without assistance.   Anesthesia review:   Patient denies shortness of breath, fever, cough and chest pain at PAT appointment.  Patient verbalized understanding and agreement to the Pre-Surgical Instructions that were given to them at this PAT appointment. Patient was also educated of the need to review these PAT instructions again prior to his/her surgery.I reviewed the appropriate phone numbers to call if they have any and questions or concerns.

## 2024-04-10 ENCOUNTER — Encounter (HOSPITAL_COMMUNITY): Payer: Self-pay

## 2024-04-10 ENCOUNTER — Encounter (HOSPITAL_COMMUNITY)
Admission: RE | Admit: 2024-04-10 | Discharge: 2024-04-10 | Disposition: A | Discharge status: Home / Self Care | Source: Ambulatory Visit | Attending: Surgery | Admitting: Surgery

## 2024-04-10 ENCOUNTER — Other Ambulatory Visit: Payer: Self-pay

## 2024-04-10 VITALS — BP 161/79 | HR 66 | Temp 97.5°F | Resp 16 | Ht 63.0 in | Wt 160.0 lb

## 2024-04-10 DIAGNOSIS — I10 Essential (primary) hypertension: Secondary | ICD-10-CM | POA: Insufficient documentation

## 2024-04-10 DIAGNOSIS — Z01812 Encounter for preprocedural laboratory examination: Secondary | ICD-10-CM | POA: Diagnosis not present

## 2024-04-10 DIAGNOSIS — Z01818 Encounter for other preprocedural examination: Secondary | ICD-10-CM

## 2024-04-10 LAB — BASIC METABOLIC PANEL WITH GFR
Anion gap: 9 (ref 5–15)
BUN: 14 mg/dL (ref 8–23)
CO2: 25 mmol/L (ref 22–32)
Calcium: 8.6 mg/dL — ABNORMAL LOW (ref 8.9–10.3)
Chloride: 105 mmol/L (ref 98–111)
Creatinine, Ser: 1.06 mg/dL — ABNORMAL HIGH (ref 0.44–1.00)
GFR, Estimated: 56 mL/min — ABNORMAL LOW (ref >60)
Glucose, Bld: 100 mg/dL — ABNORMAL HIGH (ref 70–99)
Potassium: 3.9 mmol/L (ref 3.5–5.1)
Sodium: 139 mmol/L (ref 135–145)

## 2024-04-10 LAB — CBC
HCT: 37.8 % (ref 36.0–46.0)
Hemoglobin: 11.8 g/dL — ABNORMAL LOW (ref 12.0–15.0)
MCH: 28.4 pg (ref 26.0–34.0)
MCHC: 31.2 g/dL (ref 30.0–36.0)
MCV: 91.1 fL (ref 80.0–100.0)
Platelets: 237 10*3/uL (ref 150–400)
RBC: 4.15 MIL/uL (ref 3.87–5.11)
RDW: 14.6 % (ref 11.5–15.5)
WBC: 9.1 10*3/uL (ref 4.0–10.5)
nRBC: 0 % (ref 0.0–0.2)

## 2024-04-13 NOTE — Anesthesia Preprocedure Evaluation (Signed)
 Anesthesia Evaluation  Patient identified by MRN, date of birth, ID band Patient awake    Reviewed: Allergy & Precautions, NPO status , Patient's Chart, lab work & pertinent test results  History of Anesthesia Complications Negative for: history of anesthetic complications  Airway Mallampati: III  TM Distance: >3 FB Neck ROM: Full    Dental  (+) Dental Advisory Given, Teeth Intact   Pulmonary neg pulmonary ROS   Pulmonary exam normal        Cardiovascular hypertension, + Peripheral Vascular Disease  Normal cardiovascular exam   '23 TTE - EF 60-65%. Mild AI and TR. Proximal ascending aorta upper limit of normal, 37 mm.   '23 Carotid US  - 50-69% left ICAS    Neuro/Psych  Neuromuscular disease  negative psych ROS   GI/Hepatic Neg liver ROS,GERD  Medicated and Controlled,,  Endo/Other  negative endocrine ROS    Renal/GU negative Renal ROS     Musculoskeletal  (+) Arthritis  (PMR),  Fibromyalgia -  Abdominal   Peds  Hematology  (+) Blood dyscrasia, anemia   Anesthesia Other Findings   Reproductive/Obstetrics                             Anesthesia Physical Anesthesia Plan  ASA: 3  Anesthesia Plan: General   Post-op Pain Management: Tylenol  PO (pre-op)*   Induction: Intravenous  PONV Risk Score and Plan: 3 and Treatment may vary due to age or medical condition, Ondansetron  and Dexamethasone   Airway Management Planned: Oral ETT  Additional Equipment: None  Intra-op Plan:   Post-operative Plan: Extubation in OR  Informed Consent: I have reviewed the patients History and Physical, chart, labs and discussed the procedure including the risks, benefits and alternatives for the proposed anesthesia with the patient or authorized representative who has indicated his/her understanding and acceptance.     Dental advisory given  Plan Discussed with: CRNA and  Anesthesiologist  Anesthesia Plan Comments:        Anesthesia Quick Evaluation

## 2024-04-14 ENCOUNTER — Encounter (HOSPITAL_COMMUNITY): Payer: Self-pay | Admitting: Surgery

## 2024-04-14 ENCOUNTER — Other Ambulatory Visit: Payer: Self-pay

## 2024-04-14 ENCOUNTER — Ambulatory Visit (HOSPITAL_BASED_OUTPATIENT_CLINIC_OR_DEPARTMENT_OTHER): Payer: Self-pay

## 2024-04-14 ENCOUNTER — Encounter (HOSPITAL_COMMUNITY)
Admission: RE | Disposition: A | Discharge status: Home / Self Care | Payer: Self-pay | Source: Home / Self Care | Attending: Surgery

## 2024-04-14 ENCOUNTER — Ambulatory Visit (HOSPITAL_COMMUNITY)
Admission: RE | Admit: 2024-04-14 | Discharge: 2024-04-16 | Disposition: A | Discharge status: Home / Self Care | Attending: Surgery | Admitting: Surgery

## 2024-04-14 ENCOUNTER — Ambulatory Visit (HOSPITAL_COMMUNITY): Payer: Self-pay

## 2024-04-14 DIAGNOSIS — E785 Hyperlipidemia, unspecified: Secondary | ICD-10-CM

## 2024-04-14 DIAGNOSIS — K432 Incisional hernia without obstruction or gangrene: Secondary | ICD-10-CM | POA: Insufficient documentation

## 2024-04-14 DIAGNOSIS — K219 Gastro-esophageal reflux disease without esophagitis: Secondary | ICD-10-CM | POA: Insufficient documentation

## 2024-04-14 DIAGNOSIS — I739 Peripheral vascular disease, unspecified: Secondary | ICD-10-CM | POA: Diagnosis not present

## 2024-04-14 DIAGNOSIS — I10 Essential (primary) hypertension: Secondary | ICD-10-CM | POA: Insufficient documentation

## 2024-04-14 DIAGNOSIS — K439 Ventral hernia without obstruction or gangrene: Secondary | ICD-10-CM | POA: Diagnosis present

## 2024-04-14 DIAGNOSIS — Z5331 Laparoscopic surgical procedure converted to open procedure: Secondary | ICD-10-CM | POA: Insufficient documentation

## 2024-04-14 DIAGNOSIS — Z79899 Other long term (current) drug therapy: Secondary | ICD-10-CM | POA: Diagnosis not present

## 2024-04-14 DIAGNOSIS — M797 Fibromyalgia: Secondary | ICD-10-CM | POA: Diagnosis not present

## 2024-04-14 HISTORY — PX: INCISIONAL HERNIA REPAIR: SHX193

## 2024-04-14 LAB — CBC
HCT: 36.7 % (ref 36.0–46.0)
Hemoglobin: 11.3 g/dL — ABNORMAL LOW (ref 12.0–15.0)
MCH: 28.5 pg (ref 26.0–34.0)
MCHC: 30.8 g/dL (ref 30.0–36.0)
MCV: 92.4 fL (ref 80.0–100.0)
Platelets: 232 10*3/uL (ref 150–400)
RBC: 3.97 MIL/uL (ref 3.87–5.11)
RDW: 14.6 % (ref 11.5–15.5)
WBC: 11.6 10*3/uL — ABNORMAL HIGH (ref 4.0–10.5)
nRBC: 0 % (ref 0.0–0.2)

## 2024-04-14 LAB — CREATININE, SERUM
Creatinine, Ser: 1 mg/dL (ref 0.44–1.00)
GFR, Estimated: >60 mL/min (ref >60)

## 2024-04-14 SURGERY — REPAIR, HERNIA, INCISIONAL, LAPAROSCOPIC
Anesthesia: General

## 2024-04-14 MED ORDER — PROPOFOL 10 MG/ML IV BOLUS
INTRAVENOUS | Status: AC
  Filled 2024-04-14: qty 20

## 2024-04-14 MED ORDER — SIMETHICONE 80 MG PO CHEW
80.0000 mg | CHEWABLE_TABLET | Freq: Four times a day (QID) | ORAL | Status: DC | PRN
  Administered 2024-04-16: 80 mg via ORAL
  Filled 2024-04-14: qty 1

## 2024-04-14 MED ORDER — PROPOFOL 10 MG/ML IV BOLUS
INTRAVENOUS | Status: DC | PRN
  Administered 2024-04-14: 120 mg via INTRAVENOUS

## 2024-04-14 MED ORDER — LACTATED RINGERS IR SOLN
Status: DC | PRN
  Administered 2024-04-14: 1000 mL

## 2024-04-14 MED ORDER — ONDANSETRON HCL 4 MG/2ML IJ SOLN
INTRAMUSCULAR | Status: AC
  Filled 2024-04-14: qty 2

## 2024-04-14 MED ORDER — FENTANYL CITRATE PF 50 MCG/ML IJ SOSY
PREFILLED_SYRINGE | INTRAMUSCULAR | Status: AC
  Filled 2024-04-14: qty 2

## 2024-04-14 MED ORDER — LIDOCAINE HCL (CARDIAC) PF 100 MG/5ML IV SOSY
PREFILLED_SYRINGE | INTRAVENOUS | Status: DC | PRN
  Administered 2024-04-14: 60 mg via INTRAVENOUS

## 2024-04-14 MED ORDER — CHLORHEXIDINE GLUCONATE CLOTH 2 % EX PADS: 6.0000 | MEDICATED_PAD | Freq: Once | CUTANEOUS | Status: DC

## 2024-04-14 MED ORDER — SUGAMMADEX SODIUM 200 MG/2ML IV SOLN
INTRAVENOUS | Status: DC | PRN
  Administered 2024-04-14: 200 mg via INTRAVENOUS

## 2024-04-14 MED ORDER — OXYCODONE HCL 5 MG PO TABS
10.0000 mg | ORAL_TABLET | ORAL | Status: DC | PRN
  Administered 2024-04-14 – 2024-04-15 (×3): 10 mg via ORAL
  Filled 2024-04-14 (×4): qty 2

## 2024-04-14 MED ORDER — PROPOFOL 1000 MG/100ML IV EMUL
INTRAVENOUS | Status: AC
  Filled 2024-04-14: qty 100

## 2024-04-14 MED ORDER — FENTANYL CITRATE PF 50 MCG/ML IJ SOSY
PREFILLED_SYRINGE | INTRAMUSCULAR | Status: AC
  Filled 2024-04-14: qty 1

## 2024-04-14 MED ORDER — ONDANSETRON HCL 4 MG/2ML IJ SOLN
INTRAMUSCULAR | Status: DC | PRN
  Administered 2024-04-14: 4 mg via INTRAVENOUS

## 2024-04-14 MED ORDER — FENTANYL CITRATE (PF) 250 MCG/5ML IJ SOLN
INTRAMUSCULAR | Status: DC | PRN
  Administered 2024-04-14: 50 ug via INTRAVENOUS
  Administered 2024-04-14 (×2): 25 ug via INTRAVENOUS
  Administered 2024-04-14: 100 ug via INTRAVENOUS
  Administered 2024-04-14: 50 ug via INTRAVENOUS

## 2024-04-14 MED ORDER — LACTATED RINGERS IV SOLN: INTRAVENOUS | Status: AC

## 2024-04-14 MED ORDER — KETOROLAC TROMETHAMINE 15 MG/ML IJ SOLN: 15.0000 mg | INTRAMUSCULAR | Status: DC

## 2024-04-14 MED ORDER — 0.9 % SODIUM CHLORIDE (POUR BTL) OPTIME
TOPICAL | Status: DC | PRN
  Administered 2024-04-14: 1000 mL

## 2024-04-14 MED ORDER — LIDOCAINE HCL (PF) 2 % IJ SOLN
INTRAMUSCULAR | Status: AC
  Filled 2024-04-14: qty 5

## 2024-04-14 MED ORDER — PROCHLORPERAZINE EDISYLATE 10 MG/2ML IJ SOLN: 10.0000 mg | INTRAMUSCULAR | Status: DC | PRN

## 2024-04-14 MED ORDER — GABAPENTIN 300 MG PO CAPS
300.0000 mg | ORAL_CAPSULE | ORAL | Status: AC
  Administered 2024-04-14: 300 mg via ORAL
  Filled 2024-04-14: qty 1

## 2024-04-14 MED ORDER — HYDROMORPHONE HCL 1 MG/ML IJ SOLN: 0.5000 mg | INTRAMUSCULAR | Status: DC | PRN

## 2024-04-14 MED ORDER — EPHEDRINE SULFATE-NACL 50-0.9 MG/10ML-% IV SOSY
PREFILLED_SYRINGE | INTRAVENOUS | Status: DC | PRN
  Administered 2024-04-14 (×3): 5 mg via INTRAVENOUS

## 2024-04-14 MED ORDER — ENOXAPARIN SODIUM 40 MG/0.4ML IJ SOSY
40.0000 mg | PREFILLED_SYRINGE | INTRAMUSCULAR | Status: DC
  Administered 2024-04-15 – 2024-04-16 (×2): 40 mg via SUBCUTANEOUS
  Filled 2024-04-14 (×3): qty 0.4

## 2024-04-14 MED ORDER — FENTANYL CITRATE (PF) 250 MCG/5ML IJ SOLN
INTRAMUSCULAR | Status: AC
  Filled 2024-04-14: qty 5

## 2024-04-14 MED ORDER — METHOCARBAMOL 1000 MG/10ML IJ SOLN: 500.0000 mg | Freq: Four times a day (QID) | INTRAMUSCULAR | Status: DC | PRN

## 2024-04-14 MED ORDER — FENTANYL CITRATE PF 50 MCG/ML IJ SOSY
25.0000 ug | PREFILLED_SYRINGE | INTRAMUSCULAR | Status: DC | PRN
  Administered 2024-04-14: 50 ug via INTRAVENOUS

## 2024-04-14 MED ORDER — ORAL CARE MOUTH RINSE: 15.0000 mL | Freq: Once | OROMUCOSAL | Status: AC

## 2024-04-14 MED ORDER — ROCURONIUM BROMIDE 100 MG/10ML IV SOLN
INTRAVENOUS | Status: DC | PRN
  Administered 2024-04-14: 10 mg via INTRAVENOUS
  Administered 2024-04-14: 40 mg via INTRAVENOUS

## 2024-04-14 MED ORDER — CHLORHEXIDINE GLUCONATE 0.12 % MT SOLN
15.0000 mL | Freq: Once | OROMUCOSAL | Status: AC
Start: 2024-04-14 — End: 2024-04-14
  Administered 2024-04-14: 15 mL via OROMUCOSAL

## 2024-04-14 MED ORDER — GABAPENTIN 300 MG PO CAPS
300.0000 mg | ORAL_CAPSULE | Freq: Three times a day (TID) | ORAL | Status: DC
  Administered 2024-04-14 – 2024-04-16 (×6): 300 mg via ORAL
  Filled 2024-04-14 (×4): qty 1
  Filled 2024-04-14: qty 3
  Filled 2024-04-14: qty 1

## 2024-04-14 MED ORDER — CEFAZOLIN SODIUM-DEXTROSE 2-4 GM/100ML-% IV SOLN
2.0000 g | INTRAVENOUS | Status: AC
  Administered 2024-04-14: 2 g via INTRAVENOUS
  Filled 2024-04-14: qty 100

## 2024-04-14 MED ORDER — EPHEDRINE 5 MG/ML INJ
INTRAVENOUS | Status: AC
  Filled 2024-04-14: qty 5

## 2024-04-14 MED ORDER — DEXAMETHASONE SODIUM PHOSPHATE 10 MG/ML IJ SOLN
INTRAMUSCULAR | Status: DC | PRN
  Administered 2024-04-14: 4 mg via INTRAVENOUS

## 2024-04-14 MED ORDER — OXYCODONE HCL 5 MG/5ML PO SOLN: 5.0000 mg | Freq: Once | ORAL | Status: DC | PRN

## 2024-04-14 MED ORDER — PROPOFOL 500 MG/50ML IV EMUL
INTRAVENOUS | Status: DC | PRN
  Administered 2024-04-14: 100 ug/kg/min via INTRAVENOUS

## 2024-04-14 MED ORDER — ROCURONIUM BROMIDE 10 MG/ML (PF) SYRINGE
PREFILLED_SYRINGE | INTRAVENOUS | Status: AC
  Filled 2024-04-14: qty 10

## 2024-04-14 MED ORDER — FENTANYL CITRATE PF 50 MCG/ML IJ SOSY
25.0000 ug | PREFILLED_SYRINGE | INTRAMUSCULAR | Status: DC | PRN
  Administered 2024-04-14 (×3): 50 ug via INTRAVENOUS

## 2024-04-14 MED ORDER — ACETAMINOPHEN 500 MG PO TABS
1000.0000 mg | ORAL_TABLET | Freq: Once | ORAL | Status: DC
  Filled 2024-04-14: qty 2

## 2024-04-14 MED ORDER — ONDANSETRON HCL 4 MG/2ML IJ SOLN
4.0000 mg | Freq: Once | INTRAMUSCULAR | Status: DC | PRN
Start: 2024-04-14 — End: 2024-04-14

## 2024-04-14 MED ORDER — ACETAMINOPHEN 325 MG PO TABS
650.0000 mg | ORAL_TABLET | Freq: Four times a day (QID) | ORAL | Status: DC
  Administered 2024-04-14 – 2024-04-16 (×8): 650 mg via ORAL
  Filled 2024-04-14 (×8): qty 2

## 2024-04-14 MED ORDER — ACETAMINOPHEN 500 MG PO TABS
1000.0000 mg | ORAL_TABLET | Freq: Once | ORAL | Status: AC
  Administered 2024-04-14: 1000 mg via ORAL

## 2024-04-14 MED ORDER — PANTOPRAZOLE SODIUM 40 MG PO TBEC
40.0000 mg | DELAYED_RELEASE_TABLET | Freq: Every day | ORAL | Status: DC
  Administered 2024-04-15 – 2024-04-16 (×2): 40 mg via ORAL
  Filled 2024-04-14 (×2): qty 1

## 2024-04-14 MED ORDER — ONDANSETRON HCL 4 MG/2ML IJ SOLN
4.0000 mg | Freq: Four times a day (QID) | INTRAMUSCULAR | Status: DC | PRN
  Administered 2024-04-14: 4 mg via INTRAVENOUS
  Filled 2024-04-14: qty 2

## 2024-04-14 MED ORDER — ACETAMINOPHEN 500 MG PO TABS: 1000.0000 mg | ORAL_TABLET | ORAL | Status: DC

## 2024-04-14 MED ORDER — PHENYLEPHRINE HCL-NACL 20-0.9 MG/250ML-% IV SOLN
INTRAVENOUS | Status: DC | PRN
  Administered 2024-04-14: 80 ug via INTRAVENOUS
  Administered 2024-04-14: 40 ug/min via INTRAVENOUS

## 2024-04-14 MED ORDER — BUPIVACAINE-EPINEPHRINE (PF) 0.25% -1:200000 IJ SOLN
INTRAMUSCULAR | Status: AC
  Filled 2024-04-14: qty 30

## 2024-04-14 MED ORDER — DEXAMETHASONE SODIUM PHOSPHATE 10 MG/ML IJ SOLN
INTRAMUSCULAR | Status: AC
  Filled 2024-04-14: qty 1

## 2024-04-14 MED ORDER — LACTATED RINGERS IV SOLN: INTRAVENOUS | Status: DC

## 2024-04-14 MED ORDER — BOOST / RESOURCE BREEZE PO LIQD CUSTOM
1.0000 | Freq: Three times a day (TID) | ORAL | Status: DC
  Administered 2024-04-14 – 2024-04-15 (×5): 1 via ORAL

## 2024-04-14 MED ORDER — DOCUSATE SODIUM 100 MG PO CAPS
100.0000 mg | ORAL_CAPSULE | Freq: Two times a day (BID) | ORAL | Status: DC
  Administered 2024-04-14 – 2024-04-16 (×5): 100 mg via ORAL
  Filled 2024-04-14 (×5): qty 1

## 2024-04-14 MED ORDER — OXYCODONE HCL 5 MG PO TABS: 5.0000 mg | ORAL_TABLET | Freq: Once | ORAL | Status: DC | PRN

## 2024-04-14 MED ORDER — BUPIVACAINE-EPINEPHRINE 0.25% -1:200000 IJ SOLN
INTRAMUSCULAR | Status: DC | PRN
  Administered 2024-04-14: 30 mL

## 2024-04-14 MED ORDER — OXYCODONE HCL 5 MG PO TABS
5.0000 mg | ORAL_TABLET | ORAL | Status: DC | PRN
  Administered 2024-04-14: 5 mg via ORAL
  Filled 2024-04-14: qty 1

## 2024-04-14 SURGICAL SUPPLY — 46 items
BAG COUNTER SPONGE SURGICOUNT (BAG) IMPLANT
BENZOIN TINCTURE PRP APPL 2/3 (GAUZE/BANDAGES/DRESSINGS) IMPLANT
BINDER ABDOMINAL 12 ML 46-62 (SOFTGOODS) IMPLANT
CABLE HIGH FREQUENCY MONO STRZ (ELECTRODE) IMPLANT
CHLORAPREP W/TINT 26 (MISCELLANEOUS) ×2 IMPLANT
COVER SURGICAL LIGHT HANDLE (MISCELLANEOUS) ×2 IMPLANT
DERMABOND ADVANCED .7 DNX12 (GAUZE/BANDAGES/DRESSINGS) IMPLANT
DERMABOND ADVANCED .7 DNX6 (GAUZE/BANDAGES/DRESSINGS) IMPLANT
DEVICE SECURE STRAP 25 ABSORB (INSTRUMENTS) IMPLANT
DEVICE TROCAR PUNCTURE CLOSURE (ENDOMECHANICALS) ×2 IMPLANT
DRAIN CHANNEL 19F RND (DRAIN) IMPLANT
ELECT PENCIL ROCKER SW 15FT (MISCELLANEOUS) IMPLANT
EVACUATOR SILICONE 100CC (DRAIN) IMPLANT
GLOVE BIO SURGEON STRL SZ7.5 (GLOVE) ×2 IMPLANT
GLOVE INDICATOR 8.0 STRL GRN (GLOVE) ×2 IMPLANT
GOWN STRL REUS W/ TWL XL LVL3 (GOWN DISPOSABLE) ×2 IMPLANT
IRRIGATION SUCT STRKRFLW 2 WTP (MISCELLANEOUS) IMPLANT
KIT BASIN OR (CUSTOM PROCEDURE TRAY) ×2 IMPLANT
KIT TURNOVER KIT A (KITS) IMPLANT
LHOOK LAP DISP 36CM (ELECTROSURGICAL) IMPLANT
MARKER SKIN DUAL TIP RULER LAB (MISCELLANEOUS) ×2 IMPLANT
MESH VENTRALIGHT ST 6IN CRC (Mesh General) IMPLANT
NDL SPNL 22GX3.5 QUINCKE BK (NEEDLE) ×2 IMPLANT
NEEDLE SPNL 22GX3.5 QUINCKE BK (NEEDLE) ×1 IMPLANT
SCISSORS LAP 5X35 DISP (ENDOMECHANICALS) IMPLANT
SET TUBE SMOKE EVAC HIGH FLOW (TUBING) ×2 IMPLANT
SHEARS HARMONIC 36 ACE (MISCELLANEOUS) IMPLANT
SLEEVE ADV FIXATION 5X100MM (TROCAR) IMPLANT
SLEEVE Z-THREAD 5X100MM (TROCAR) IMPLANT
SPIKE FLUID TRANSFER (MISCELLANEOUS) ×2 IMPLANT
SPONGE T-LAP 18X18 ~~LOC~~+RFID (SPONGE) IMPLANT
STRIP CLOSURE SKIN 1/2X4 (GAUZE/BANDAGES/DRESSINGS) IMPLANT
SUT ETHILON 2 0 PS N (SUTURE) IMPLANT
SUT MNCRL AB 4-0 PS2 18 (SUTURE) ×2 IMPLANT
SUT NOVA 0 T19/GS 22DT (SUTURE) IMPLANT
SUT NOVA 1 T20/GS 25DT (SUTURE) IMPLANT
SUT VIC AB 2-0 SH 18 (SUTURE) IMPLANT
SUT VIC AB 3-0 SH 18 (SUTURE) IMPLANT
SYSTEM WOUND ALEXIS 18CM MED (MISCELLANEOUS) IMPLANT
TOWEL OR 17X26 10 PK STRL BLUE (TOWEL DISPOSABLE) ×2 IMPLANT
TRAY FOLEY MTR SLVR 14FR STAT (SET/KITS/TRAYS/PACK) IMPLANT
TRAY FOLEY MTR SLVR 16FR STAT (SET/KITS/TRAYS/PACK) IMPLANT
TRAY LAPAROSCOPIC (CUSTOM PROCEDURE TRAY) ×2 IMPLANT
TROCAR ADV FIXATION 12X100MM (TROCAR) IMPLANT
TROCAR ADV FIXATION 5X100MM (TROCAR) ×2 IMPLANT
TROCAR XCEL NON-BLD 5MMX100MML (ENDOMECHANICALS) ×2 IMPLANT

## 2024-04-14 NOTE — Anesthesia Procedure Notes (Signed)
 Procedure Name: Intubation Date/Time: 04/14/2024 7:49 AM  Performed by: Elaina Graver, CRNAPre-anesthesia Checklist: Patient identified, Emergency Drugs available, Suction available and Patient being monitored Patient Re-evaluated:Patient Re-evaluated prior to induction Oxygen Delivery Method: Circle System Utilized Preoxygenation: Pre-oxygenation with 100% oxygen Induction Type: IV induction Ventilation: Mask ventilation without difficulty Laryngoscope Size: Mac and 4 Grade View: Grade II Tube type: Oral Tube size: 7.0 mm Number of attempts: 1 Placement Confirmation: ETT inserted through vocal cords under direct vision, positive ETCO2 and breath sounds checked- equal and bilateral Secured at: 21 cm Tube secured with: Tape Dental Injury: Teeth and Oropharynx as per pre-operative assessment

## 2024-04-14 NOTE — Op Note (Signed)
 Patient: Jenny Glass (13-Jun-1951, [id number])  Date of Surgery: 04/14/2024  Preoperative Diagnosis: INCISIONAL HERNIA (3.7 cm x 1.9 cm based on preoperative CT)  Postoperative Diagnosis: INCISIONAL HERNIA (3.7 cm x 1.9 cm based on preoperative CT)  Surgical Procedure: LAPAROSCOPIC CONVERTED TO OPEN INCISIONAL HERNIA REPAIR WITH MESH  Operative Team Members:  Surgeons and Role:    * Lealer Marsland, Avon Boers, MD - Primary   Anesthesiologist: [name], MD CRNA: Elaina Graver, CRNA   Anesthesia: General   Fluids:  Total I/O In: 1100 [I.V.:1000; IV Piggyback:100] Out: 50 [Other:50]  Complications: None  Drains:  none   Specimen: None  Disposition:  PACU - hemodynamically stable.  Plan of Care: Admit for overnight observation    Indications for Procedure: Jenny Glass is a 73 y.o. female who presented with an incisional hernia after robotic colorectal surgery at an old left lower quadrant ostomy site.  I recommended laparoscopic, possibly open incisional hernia repair with mesh.  We discussed the procedure absorbable sutures present, and all of them.  After full discussion all questions answered patient ready to proceed  The procedure itself as well as its risks, benefits and alternatives were discussed.  The risks discussed included but were not limited to the risk of infection, bleeding, damage to nearby structures, and mesh complication.  After a full discussion and all questions answered the patient granted consent to proceed.  Findings:  3.7 cm x 1.9 cm periumbilical incisional hernia from previous ostomy site  Intraperitoneal onlay mesh repair Ventralex 15 cm round mesh.  Secure strap fixation   Description of Procedure:   On the date stated above the patient was taken operating suite and placed in supine position.  General endotracheal anesthesia was induced.  A timeout was completed verifying correct patient, procedure, position, equipment for the  case.  The patient's abdomen was prepped and draped in usual sterile fashion.  I made an incision in the right upper quadrant with inflated the abdomen to 15 mmHg after accessing the abdomen the Optiview trocar.  Two additional 5 mm trocars were placed on the right flank.  The abdomen was inspected.  I was anticipating a segment of small intestine being stuck in the hernia, however this had reduced with insufflation.  I then made an incision overlying the hernia.  I dissected the hernia free of surrounding tissues and reduced the hernia sac.  I attempted to create a preperitoneal space, but there is scarring from the previous surgeries prohibited me from the able to pedal down the peritoneum.  I decided proceed with a intraperitoneal onlay mesh repair.  A 15 mm round mesh was introduced in the abdomen Fexo abdominal band 2 vacations using Vicryl suture to hold it up to the abdominal cavity was inflated.  A Novafil suture was used to close the fascial defect at the base the umbilicus from underneath through the hernia defect.  The fascia was then closed with 4 figure-of-eight 0 Novafil sutures.  The subcutaneous tissues were reapproximated using Vicryl suture and 4-0 Monocryl and Dermabond were applied to this incision.  We then reinflated the abdomen using the three 5 mm trocars down the right side and placed a double row of the superior strap fixation to fix the mesh to the abdominal wall.  The abdomen was then deflated and the 5 mm trocars were removed.  The skin was closed with 4-0 Monocryl and Dermabond.  All sponge needle counts were correct at the end the case.  At the end of the case we reviewed the infection status of the case. Patient: Private Patient Elective Case Case: Elective Infection Present At Time Of Surgery (PATOS): None  Jenny Favre, MD General, Bariatric, & Minimally Invasive Surgery [facility], [place]

## 2024-04-14 NOTE — Anesthesia Postprocedure Evaluation (Signed)
 Anesthesia Post Note  Patient: Jenny Glass  Procedure(s) Performed: LAPAROSCOPIC CONVERTED TO OPEN INCISIONAL HERNIA REPAIR WITH MESH     Patient location during evaluation: PACU Anesthesia Type: General Level of consciousness: awake and alert Pain management: pain level controlled Vital Signs Assessment: post-procedure vital signs reviewed and stable Respiratory status: spontaneous breathing, nonlabored ventilation and respiratory function stable Cardiovascular status: stable and blood pressure returned to baseline Anesthetic complications: no  No notable events documented.  Last Vitals:  Vitals:   04/14/24 1030 04/14/24 1045  BP: 135/77 (!) 140/77  Pulse: 66 71  Resp: 13 20  Temp:  (!) 35.7 C  SpO2: 98% 99%    Last Pain:  Vitals:   04/14/24 1045  TempSrc:   PainSc: Asleep                 [name]

## 2024-04-14 NOTE — Plan of Care (Signed)

## 2024-04-14 NOTE — H&P (Signed)
 Admitting Physician: Avon Boers Apple Dearmas  Service: General surgery  CC: hernia  Subjective   HPI: Jenny Glass is an 73 y.o. female who is here for hernia repair  Past Medical History:  Diagnosis Date   Arthritis    Carotid artery stenosis    Chronic back pain    Diverticulitis    Fibromyalgia    Hyperlipidemia    Hypertension    no meds   Numbness and tingling    hands bilat and lower legs bilat comes and goes    Pneumonia    Rheumatoid arthritis (HCC)    polymyalgia rheumatica   UTI (lower urinary tract infection)     Past Surgical History:  Procedure Laterality Date   COLON SURGERY     COLOSTOMY Left    LLQ   FLEXIBLE SIGMOIDOSCOPY N/A 04/08/2023   Procedure: FLEXIBLE SIGMOIDOSCOPY;  Surgeon: Melvenia Stabs, MD;  Location: [facility];  Service: General;  Laterality: N/A;   FLEXIBLE SIGMOIDOSCOPY N/A 07/19/2023   Procedure: Marlynn Singer;  Surgeon: Melvenia Stabs, MD;  Location: [facility];  Service: General;  Laterality: N/A;   SHOULDER OPEN ROTATOR CUFF REPAIR Left 11/10/2015   Procedure: LEFT SHOULDER MINI OPEN ROTATOR CUFF REPAIR AND DISTAL CLAVICAL RESECTION;  Surgeon: Orvan Blanch, MD;  Location: [facility];  Service: Orthopedics;  Laterality: Left;   XI ROBOTIC ASSISTED COLOSTOMY TAKEDOWN N/A 07/19/2023   Procedure: XI ROBOTIC LAR WITH COLOSTOMY TAKEDOWN AND ASSESSMENT OF PERFUSION USING FIREFLY AND BILATERAL TAP BLOCK;  Surgeon: Melvenia Stabs, MD;  Location: [facility];  Service: General;  Laterality: N/A;  4 HOURS TOTAL INCLUDING [facility] TIME   XI ROBOTIC ASSISTED LOWER ANTERIOR RESECTION N/A 10/12/2022   Procedure: ROBOTIC SIGMOIDECTOMY WITH LOW ANTERIOR RESECTION, OSTOMY CREATION;  Surgeon: Melvenia Stabs, MD;  Location: [facility];  Service: General;  Laterality: N/A;    Family History  Problem Relation Age of Onset   Cancer Mother    Cancer Father    Colon cancer Neg Hx    Esophageal cancer Neg Hx    Rectal cancer Neg  Hx    Stomach cancer Neg Hx     Social:  reports that she has never smoked. She has never used smokeless tobacco. She reports that she does not drink alcohol and does not use drugs.  Allergies: No Known Allergies  Medications: Current Outpatient Medications  Medication Instructions   acetaminophen  (TYLENOL ) 500-1,000 mg, Oral, Every 6 hours PRN   golimumab  (SIMPONI  ARIA) 50 MG/4ML SOLN injection See admin instructions, 2mg /kg Intravenous every 8 wks   pantoprazole (PROTONIX) 40 mg, Oral, Daily before breakfast   predniSONE  (DELTASONE ) 2 mg, Oral, Daily with breakfast    ROS - all of the below systems have been reviewed with the patient and positives are indicated with bold text General: chills, fever or night sweats Eyes: blurry vision or double vision ENT: epistaxis or sore throat Allergy/Immunology: itchy/watery eyes or nasal congestion Hematologic/Lymphatic: bleeding problems, blood clots or swollen lymph nodes Endocrine: temperature intolerance or unexpected weight changes Breast: new or changing breast lumps or nipple discharge Resp: cough, shortness of breath, or wheezing CV: chest pain or dyspnea on exertion GI: as per HPI GU: dysuria, trouble voiding, or hematuria MSK: joint pain or joint stiffness Neuro: TIA or stroke symptoms Derm: pruritus and skin lesion changes Psych: anxiety and depression  Objective   PE Blood pressure 137/86, pulse 63, temperature 97.8 F (36.6 C), temperature source Oral, resp. rate 16, height 5'  3" (1.6 m), weight 72.6 kg, SpO2 96%. Constitutional: NAD; conversant; no deformities Eyes: Moist conjunctiva; no lid lag; anicteric; PERRL Neck: Trachea midline; no thyromegaly Lungs: Normal respiratory effort; no tactile fremitus CV: RRR; no palpable thrills; no pitting edema GI: Abd incisional hernia; no palpable hepatosplenomegaly MSK: Normal range of motion of extremities; no clubbing/cyanosis Psychiatric: Appropriate affect; alert and  oriented x3 Lymphatic: No palpable cervical or axillary lymphadenopathy  No results found for this or any previous visit (from the past 24 hours).  Imaging Orders  No imaging studies ordered today   CT abd/pel 01/30/24  Status post left hemicolectomy. No colonic wall thickening or  inflammatory changes.   Moderate hiatal hernia.   3.7 cm x 1.9 cm incisional hernia near the umbilicus   Assessment and Plan   Incisional hernia with intestinal involvement Incisional hernia located subumbilically on the left, at a previous ostomy site, with intestinal protrusion causing significant pain and discomfort, especially when standing or walking. The hernia is urgent due to intestinal involvement and symptom severity, with surgical repair prioritized to prevent bowel obstruction or strangulation. - Schedule urgent surgical repair of the incisional hernia. - Perform laparoscopic hernia repair, potentially using mesh reinforcement. - Advise ER visit for severe, unresolved pain, indicating potential emergency repair.  Hiatal hernia Hiatal hernia with gastric sliding into the thoracic cavity through the diaphragm. Reports new upper abdominal pain without typical hiatal hernia symptoms such as heartburn or dysphagia. Pain may be stress-related or linked to the incisional hernia. The hiatal hernia is non-urgent and will be evaluated post-incisional hernia repair to avoid unnecessary surgical risks. - Prescribe Protonix for 90 days with three refills for potential stress-related gastritis. - Refer to gastroenterology for EGD to further evaluate the hiatal hernia post-incisional hernia repair.  [name], MD  [facility] Use [email or web address] to contact on call provider

## 2024-04-14 NOTE — Transfer of Care (Signed)
 Immediate Anesthesia Transfer of Care Note  Patient: Jenny Glass  Procedure(s) Performed: LAPAROSCOPIC CONVERTED TO OPEN INCISIONAL HERNIA REPAIR WITH MESH  Patient Location: PACU  Anesthesia Type:General  Level of Consciousness: drowsy  Airway & Oxygen Therapy: Patient Spontanous Breathing and Patient connected to face mask oxygen  Post-op Assessment: Report given to RN and Post -op Vital signs reviewed and stable  Post vital signs: Reviewed and stable  Last Vitals:  Vitals Value Taken Time  BP 150/82 04/14/24 0939  Temp 36.4 C 04/14/24 0939  Pulse 81 04/14/24 0943  Resp 15 04/14/24 0943  SpO2 98 % 04/14/24 0943  Vitals shown include unfiled device data.  Last Pain:  Vitals:   04/14/24 0939  TempSrc:   PainSc: 10-Worst pain ever         Complications: No notable events documented.

## 2024-04-15 ENCOUNTER — Encounter (HOSPITAL_COMMUNITY): Payer: Self-pay | Admitting: Surgery

## 2024-04-15 DIAGNOSIS — Z5331 Laparoscopic surgical procedure converted to open procedure: Secondary | ICD-10-CM | POA: Diagnosis not present

## 2024-04-15 DIAGNOSIS — K219 Gastro-esophageal reflux disease without esophagitis: Secondary | ICD-10-CM | POA: Diagnosis not present

## 2024-04-15 DIAGNOSIS — Z79899 Other long term (current) drug therapy: Secondary | ICD-10-CM | POA: Diagnosis not present

## 2024-04-15 DIAGNOSIS — K432 Incisional hernia without obstruction or gangrene: Secondary | ICD-10-CM | POA: Diagnosis not present

## 2024-04-15 DIAGNOSIS — I739 Peripheral vascular disease, unspecified: Secondary | ICD-10-CM | POA: Diagnosis not present

## 2024-04-15 DIAGNOSIS — I10 Essential (primary) hypertension: Secondary | ICD-10-CM | POA: Diagnosis not present

## 2024-04-15 NOTE — Progress Notes (Signed)
 Progress Note: General Surgery Service   Chief Complaint/Subjective: Pain, face feels hot.  No bowel movement.  Ate a hamburger  Objective: Vital signs in last 24 hours: Temp:  [97.5 F (36.4 C)-97.7 F (36.5 C)] 97.6 F (36.4 C) (06/11 1428) Pulse Rate:  [67-78] 74 (06/11 1428) Resp:  [16-19] 17 (06/11 1428) BP: (114-137)/(66-79) 137/66 (06/11 1428) SpO2:  [94 %-99 %] 99 % (06/11 1428) Last BM Date : 04/13/24  Intake/Output from previous day: 06/10 0701 - 06/11 0700 In: 2760 [P.O.:1260; I.V.:1400; IV Piggyback:100] Out: 2225 [Urine:2150; Blood:25] Intake/Output this shift: Total I/O In: 2190.5 [P.O.:908; I.V.:1282.5] Out: 370 [Urine:370]  Constitutional: NAD; conversant; no deformities Eyes: Moist conjunctiva; no lid lag; anicteric; PERRL Neck: Trachea midline; no thyromegaly Lungs: Normal respiratory effort; no tactile fremitus CV: RRR; no palpable thrills; no pitting edema GI: Abd soft, incisions c/d/I w/ glue, tenderness around hernia repair.; no palpable hepatosplenomegaly MSK: Normal range of motion of extremities; no clubbing/cyanosis Psychiatric: Appropriate affect; alert and oriented x3 Lymphatic: No palpable cervical or axillary lymphadenopathy  Lab Results: CBC  Recent Labs    04/14/24 1217  WBC 11.6*  HGB 11.3*  HCT 36.7  PLT 232   BMET Recent Labs    04/14/24 1217  CREATININE 1.00   PT/INR No results for input(s): LABPROT, INR in the last 72 hours. ABG No results for input(s): PHART, HCO3 in the last 72 hours.  Invalid input(s): PCO2, PO2  Anti-infectives: Anti-infectives (From admission, onward)    Start     Dose/Rate Route Frequency Ordered Stop   04/14/24 0600  ceFAZolin  (ANCEF ) IVPB 2g/100 mL premix        2 g 200 mL/hr over 30 Minutes Intravenous On call to O.R. 04/14/24 0537 04/14/24 0811       Medications: Scheduled Meds:  acetaminophen   650 mg Oral Q6H   docusate sodium   100 mg Oral BID   enoxaparin  (LOVENOX )  injection  40 mg Subcutaneous Q24H   feeding supplement  1 Container Oral TID BM   gabapentin   300 mg Oral TID   pantoprazole  40 mg Oral QAC breakfast   Continuous Infusions: PRN Meds:.HYDROmorphone  (DILAUDID ) injection, methocarbamol  (ROBAXIN ) injection, ondansetron  (ZOFRAN ) IV, oxyCODONE , oxyCODONE , prochlorperazine, simethicone   Assessment/Plan: s/p Procedure(s): LAPAROSCOPIC CONVERTED TO OPEN INCISIONAL HERNIA REPAIR WITH MESH 04/14/2024  Doing well POD1 Remain in hospital for pain control PT - recommended walker Increase activity Diet as tolerated Pulmonary toilet    LOS: 0 days    [name], MD  [facility] Use [email or web address] to contact on call provider  Daily Billing: [id number] - post op

## 2024-04-15 NOTE — TOC Initial Note (Addendum)
 Transition of Care [facility]) - Initial/Assessment Note    Patient Details  Name: Jenny Glass MRN: [id number] Date of Birth: May 20, 1951  Transition of Care [facility]) CM/SW Contact:    Jenny Ream, RN Phone Number: 04/15/2024, 10:04 AM  Clinical Narrative:                 Spoke w/ pt in room; pt says she lives at home w/ her husband Jenny Glass [phone]); she plans to return at d/c; he will provide transportation; pt verified insurance/PCP; she denied SDOH risks; pt says she does not have DME, HH services, or home oxygen; no TOC needs. TOC is signing off; please place consult if needed.  -[phone]- notified by PT that pt needs RW; spoke w/ pt in room; she agrees to receive recc DME; she does not have an agency preference; spoke w/ Jenny Glass at [place] [facility]; he says agency can provide DME and it will be delivered to room today; pt notified; no TOC needs. Expected Discharge Plan: Home/Self Care Barriers to Discharge: Continued Medical Work up   Patient Goals and CMS Choice Patient states their goals for this hospitalization and ongoing recovery are:: home CMS Medicare.gov Compare Post Acute Care list provided to:: Patient   [place] ownership interest in [facility].provided to:: Patient    Expected Discharge Plan and Services   Discharge Planning Services: CM Consult   Living arrangements for the past 2 months: Single Family Home                 DME Arranged: N/A DME Agency: NA       HH Arranged: NA HH Agency: NA        Prior Living Arrangements/Services Living arrangements for the past 2 months: Single Family Home Lives with:: Spouse Patient language and need for interpreter reviewed:: Yes Do you feel safe going back to the place where you live?: Yes        Care giver support system in place?: Yes (comment) Current home services:  (n/a) Criminal Activity/Legal Involvement Pertinent to Current Situation/Hospitalization: No - Comment as needed  Activities of  Daily Living   ADL Screening (condition at time of admission) Independently performs ADLs?: Yes (appropriate for developmental age) Is the patient deaf or have difficulty hearing?: No Does the patient have difficulty seeing, even when wearing glasses/contacts?: Yes Does the patient have difficulty concentrating, remembering, or making decisions?: No  Permission Sought/Granted Permission sought to share information with : Case Manager Permission granted to share information with : Yes, Verbal Permission Granted  Share Information with NAME: Case Manager     Permission granted to share info w Relationship: Jenny Glass (spouse) [phone]     Emotional Assessment Appearance:: Appears stated age Attitude/Demeanor/Rapport: Gracious Affect (typically observed): Accepting Orientation: : Oriented to Self, Oriented to Place, Oriented to  Time, Oriented to Situation Alcohol / Substance Use: Not Applicable Psych Involvement: No (comment)  Admission diagnosis:  Incisional hernia, without obstruction or gangrene [K43.2] Ventral hernia [K43.9] Patient Active Problem List   Diagnosis Date Noted   Ventral hernia 04/14/2024   S/P colostomy takedown 07/19/2023   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   S/P laparoscopic-assisted sigmoidectomy 10/12/2022   Medication monitoring encounter 08/09/2022   Polymyalgia rheumatica (HCC) 07/25/2022   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 07/25/2022   Malnutrition of moderate degree 07/17/2022   Abscess of sigmoid colon due to diverticulitis 07/11/2022   Hyperlipidemia    Chronic back pain    Hypertension    PVD (peripheral vascular  disease) (HCC)    Chronic low back pain 05/25/2016   Rotator cuff tear 11/10/2015   Rupture of rotator cuff of shoulder 11/10/2015   PCP:  Jenny Grime, MD Pharmacy:   [id number] [place] Glass, [place] - [phone] AT [facility] & [address] [place]  [address] Phone: [phone] Fax: [phone]     Social Drivers of Health (SDOH) Social History: SDOH Screenings   Food Insecurity: No Food Insecurity (04/15/2024)  Housing: Low Risk  (04/15/2024)  Transportation Needs: No Transportation Needs (04/15/2024)  Utilities: Not At Risk (04/15/2024)  Depression (PHQ2-9): Low Risk  (09/07/2022)  Social Connections: Socially Integrated (04/14/2024)  Tobacco Use: Low Risk  (04/14/2024)   SDOH Interventions: Food Insecurity Interventions: Intervention Not Indicated, Inpatient TOC Housing Interventions: Intervention Not Indicated, Inpatient TOC Transportation Interventions: Intervention Not Indicated, Inpatient TOC Utilities Interventions: Intervention Not Indicated, Inpatient TOC   Readmission Risk Interventions    10/17/2022    1:55 PM  Readmission Risk Prevention Plan  Post Dischage Appt Complete  Medication Screening Complete  Transportation Screening Complete

## 2024-04-15 NOTE — Evaluation (Signed)
 Physical Therapy Evaluation Patient Details Name: Jenny Glass MRN: [id number] DOB: 1951/03/06 Today's Date: 04/15/2024  History of Present Illness  Jenny Glass is a 73 yo female presents with incisional hernia now s/p laparoscopic converted to open incisional hernia repair with mesh on 04/14/24. PMH: arthritis, fibromyalgia, HTN, hyperlipidemia, RA  Clinical Impression  Pt admitted with above diagnosis. Pt from home, ind at baseline, pt retired and retired spouse present, 1 level home with 2 steps to enter. On eval, pt needing in A for supine<>sit via log rolling with cues for technique and breath work to avoid breath holding. Pt completes STS transfer and amb 574ft with RW, supv for safety. Anticipate no f/u HHPT at discharge; recommend RW. Pt currently with functional limitations due to the deficits listed below (see PT Problem List). Pt will benefit from acute skilled PT to increase their independence and safety with mobility to allow discharge.           If plan is discharge home, recommend the following: A little help with walking and/or transfers;A little help with bathing/dressing/bathroom;Assistance with cooking/housework;Assist for transportation;Help with stairs or ramp for entrance   Can travel by private vehicle        Equipment Recommendations Rolling walker (2 wheels)  Recommendations for Other Services       Functional Status Assessment Patient has had a recent decline in their functional status and demonstrates the ability to make significant improvements in function in a reasonable and predictable amount of time.     Precautions / Restrictions Precautions Precautions: Fall Recall of Precautions/Restrictions: Intact Precaution/Restrictions Comments: abdominal surgery Restrictions Weight Bearing Restrictions Per Provider Order: No      Mobility  Bed Mobility Overal bed mobility: Needs Assistance Bed Mobility: Rolling, Sidelying to Sit, Sit to  Sidelying Rolling: Min assist Sidelying to sit: Min assist     Sit to sidelying: Min assist General bed mobility comments: min A supine<>sit via log rolling, heavy cues for technique and breath work to avoid breath holding    Transfers Overall transfer level: Needs assistance Equipment used: Rolling walker (2 wheels) Transfers: Sit to/from Stand Sit to Stand: Supervision           General transfer comment: supv to power to stand, slow to rise, trunk slow to fully upright/extend    Ambulation/Gait Ambulation/Gait assistance: Supervision Gait Distance (Feet): 500 Feet Assistive device: Rolling walker (2 wheels) Gait Pattern/deviations: Step-through pattern, Decreased stride length Gait velocity: decreased     General Gait Details: decreased cadence, step-through gait pattern, slow cautious step progression, decreased trunk rotation, encouraged for breathing and to avoid breath holding  Stairs            Wheelchair Mobility     Tilt Bed    Modified Rankin (Stroke Patients Only)       Balance Overall balance assessment: No apparent balance deficits (not formally assessed)                                           Pertinent Vitals/Pain Pain Assessment Pain Assessment: 0-10 Pain Score: 7  Pain Location: abdomen Pain Descriptors / Indicators: Operative site guarding Pain Intervention(s): Limited activity within patient's tolerance, Monitored during session, Repositioned    Home Living Family/patient expects to be discharged to:: Private residence Living Arrangements: Spouse/significant other Available Help at Discharge: Family;Available 24 hours/day Type of Home: House Home Access:  Stairs to enter Entrance Stairs-Rails: None Entrance Stairs-Number of Steps: 2   Home Layout: One level Home Equipment: None      Prior Function Prior Level of Function : Independent/Modified Independent             Mobility Comments: pt reports  ind without AD, denies falls ADLs Comments: pt reports ind     Extremity/Trunk Assessment   Upper Extremity Assessment Upper Extremity Assessment: Overall WFL for tasks assessed    Lower Extremity Assessment Lower Extremity Assessment: Overall WFL for tasks assessed    Cervical / Trunk Assessment Cervical / Trunk Assessment: Normal  Communication   Communication Communication: No apparent difficulties    Cognition Arousal: Alert Behavior During Therapy: WFL for tasks assessed/performed   PT - Cognitive impairments: No apparent impairments                         Following commands: Intact       Cueing Cueing Techniques: Verbal cues     General Comments      Exercises     Assessment/Plan    PT Assessment Patient needs continued PT services  PT Problem List Decreased strength;Decreased activity tolerance;Decreased balance;Decreased mobility;Decreased knowledge of use of DME;Decreased safety awareness       PT Treatment Interventions DME instruction;Gait training;Stair training;Functional mobility training;Therapeutic activities;Therapeutic exercise;Patient/family education    PT Goals (Current goals can be found in the Care Plan section)  Acute Rehab PT Goals Patient Stated Goal: return home, no pain PT Goal Formulation: With patient Time For Goal Achievement: 04/29/24 Potential to Achieve Goals: Good    Frequency Min 2X/week     Co-evaluation               AM-PAC PT 6 Clicks Mobility  Outcome Measure Help needed turning from your back to your side while in a flat bed without using bedrails?: A Little Help needed moving from lying on your back to sitting on the side of a flat bed without using bedrails?: A Little Help needed moving to and from a bed to a chair (including a wheelchair)?: A Little Help needed standing up from a chair using your arms (e.g., wheelchair or bedside chair)?: A Little Help needed to walk in hospital room?: A  Little Help needed climbing 3-5 steps with a railing? : A Little 6 Click Score: 18    End of Session Equipment Utilized During Treatment: Gait belt Activity Tolerance: Patient tolerated treatment well Patient left: in chair;with call bell/phone within reach;Other (comment) (TOC in room) Nurse Communication: Mobility status PT Visit Diagnosis: Unsteadiness on feet (R26.81)    Time: 1610-9604 PT Time Calculation (min) (ACUTE ONLY): 28 min   Charges:   PT Evaluation $PT Eval Low Complexity: 1 Low PT Treatments $Therapeutic Activity: 8-22 mins PT General Charges $$ ACUTE PT VISIT: 1 Visit         Tori Quanika Solem PT, DPT 04/15/24, 10:03 AM

## 2024-04-16 ENCOUNTER — Other Ambulatory Visit (HOSPITAL_COMMUNITY): Payer: Self-pay

## 2024-04-16 DIAGNOSIS — K432 Incisional hernia without obstruction or gangrene: Secondary | ICD-10-CM | POA: Diagnosis not present

## 2024-04-16 DIAGNOSIS — K219 Gastro-esophageal reflux disease without esophagitis: Secondary | ICD-10-CM | POA: Diagnosis not present

## 2024-04-16 DIAGNOSIS — I10 Essential (primary) hypertension: Secondary | ICD-10-CM | POA: Diagnosis not present

## 2024-04-16 DIAGNOSIS — Z79899 Other long term (current) drug therapy: Secondary | ICD-10-CM | POA: Diagnosis not present

## 2024-04-16 DIAGNOSIS — I739 Peripheral vascular disease, unspecified: Secondary | ICD-10-CM | POA: Diagnosis not present

## 2024-04-16 DIAGNOSIS — Z5331 Laparoscopic surgical procedure converted to open procedure: Secondary | ICD-10-CM | POA: Diagnosis not present

## 2024-04-16 MED ORDER — METHOCARBAMOL 500 MG PO TABS
500.0000 mg | ORAL_TABLET | Freq: Four times a day (QID) | ORAL | 0 refills | Status: DC
  Filled 2024-04-16: qty 30, 8d supply, fill #0

## 2024-04-16 MED ORDER — GABAPENTIN 100 MG PO CAPS
100.0000 mg | ORAL_CAPSULE | Freq: Three times a day (TID) | ORAL | 0 refills | Status: AC | PRN
  Filled 2024-04-16: qty 30, 10d supply, fill #0

## 2024-04-16 MED ORDER — OXYCODONE-ACETAMINOPHEN 5-325 MG PO TABS
1.0000 | ORAL_TABLET | ORAL | 0 refills | Status: AC | PRN
  Filled 2024-04-16: qty 20, 4d supply, fill #0

## 2024-04-16 NOTE — Discharge Summary (Signed)
 Patient ID: Jenny Glass [id number] 73 y.o. 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   Malnutrition of moderate degree 07/17/2022   Abscess of sigmoid colon due to diverticulitis 07/11/2022   Hyperlipidemia    Chronic back pain    Hypertension    PVD (peripheral vascular disease) (HCC)    Chronic low back pain 05/25/2016   Rotator cuff tear 11/10/2015   Rupture of rotator cuff of shoulder 11/10/2015     Discharge Diagnoses:  Patient Active Problem List   Diagnosis Date Noted   Ventral hernia 04/14/2024   S/P colostomy takedown 07/19/2023   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   S/P laparoscopic-assisted sigmoidectomy 10/12/2022   Medication monitoring encounter 08/09/2022   Polymyalgia rheumatica (HCC) 07/25/2022   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 07/25/2022   Malnutrition of moderate degree 07/17/2022   Abscess of sigmoid colon due to diverticulitis 07/11/2022   Hyperlipidemia    Chronic back pain    Hypertension    PVD (peripheral vascular disease) (HCC)    Chronic low back pain 05/25/2016   Rotator cuff tear 11/10/2015   Rupture of rotator cuff of shoulder 11/10/2015    Operations: Procedure(s): LAPAROSCOPIC CONVERTED TO OPEN INCISIONAL HERNIA REPAIR WITH MESH  Admission Condition: good  Discharged Condition: good  Indication for Admission: Incisional Hernia  Hospital Course: Jenny Glass underwent laparoscopic  converted to open intraperitoneal onlay mesh repair of periumbilical incisional hernia from previous ostomy site on 04/15/24.  She recovered in the hospital then was discharged.  Consults: None  Significant Diagnostic Studies: None  Treatments: surgery: As above  Disposition: Home  Patient Instructions:  Allergies as of 04/16/2024   No Known Allergies      Medication List     TAKE these medications    acetaminophen  500 MG tablet Commonly known as: TYLENOL  Take 500-1,000 mg by mouth every 6 (six) hours as needed (pain.).   gabapentin  100 MG capsule Commonly known as: Neurontin  Take 1 capsule (100 mg total) by mouth 3 (three) times daily as needed.   methocarbamol  500 MG tablet Commonly known as: ROBAXIN  Take 1 tablet (500 mg total) by mouth 4 (four) times daily.   oxyCODONE -acetaminophen  5-325 MG tablet Commonly known as: Percocet Take 1 tablet by mouth every 4 (four) hours as needed for severe pain (pain score 7-10).   pantoprazole 40 MG tablet Commonly known as: PROTONIX Take 40 mg by mouth daily before breakfast.   predniSONE  1 MG tablet Commonly known as: DELTASONE  Take 2 mg by mouth daily with breakfast.   Simponi  Aria 50 MG/4ML Soln injection Generic drug: golimumab  See admin instructions. 2mg /kg Intravenous every 8 wks               Durable Medical Equipment  (From admission, onward)           Start     Ordered   04/15/24 1013  For home use only DME Walker rolling  Once       Question Answer Comment  Walker: With 5 Inch Wheels   Patient needs a walker to  treat with the following condition Walker as ambulation aid      04/15/24 1013            Activity: no heavy lifting for 4 weeks Diet: regular diet Wound Care: keep wound clean and dry  Follow-up:  With Dr. Marny Sires in 4 weeks.  Signed: Avon Boers Jenny Glass [facility] Bariat[facility]imally [facility] [place]   04/16/2024, 7:49 AM

## 2024-04-16 NOTE — TOC Transition Note (Signed)
 Transition of Care [facility]) - Discharge Note   Patient Details  Name: Jenny Glass MRN: [id number] Date of Birth: [birth year]  Transition of Care [facility]) CM/SW Contact:  Bari Leys, RN Phone Number: 04/16/2024, 10:12 AM   Clinical Narrative:   DC to home order. [employer] to deliver RW to patient's home. No further TOC needs.     Final next level of care: Home/Self Care Barriers to Discharge: Continued Medical Work up   Patient Goals and CMS Choice Patient states their goals for this hospitalization and ongoing recovery are:: home CMS Medicare.gov Compare Post Acute Care list provided to:: Patient   [place] ownership interest in [facility].provided to:: Patient    Discharge Placement                       Discharge Plan and Services Additional resources added to the After Visit Summary for     Discharge Planning Services: CM Consult            DME Arranged: Otho Blitz rolling DME Agency: [employer] Date DME Agency Contacted: 04/15/24 Time DME Agency Contacted: 1016 Representative spoke with at DME Agency: Zula Hitch HH Arranged: NA HH Agency: NA        Social Drivers of Health (SDOH) Interventions SDOH Screenings   Food Insecurity: No Food Insecurity (04/15/2024)  Housing: Low Risk  (04/15/2024)  Transportation Needs: No Transportation Needs (04/15/2024)  Utilities: Not At Risk (04/15/2024)  Depression (PHQ2-9): Low Risk  (09/07/2022)  Social Connections: Socially Integrated (04/14/2024)  Tobacco Use: Low Risk  (04/14/2024)     Readmission Risk Interventions    10/17/2022    1:55 PM  Readmission Risk Prevention Plan  Post Dischage Appt Complete  Medication Screening Complete  Transportation Screening Complete

## 2024-04-16 NOTE — Discharge Instructions (Signed)

## 2024-04-16 NOTE — Plan of Care (Signed)

## 2024-04-22 ENCOUNTER — Encounter (HOSPITAL_COMMUNITY): Payer: Self-pay | Admitting: Surgery

## 2024-05-05 ENCOUNTER — Other Ambulatory Visit: Payer: Self-pay | Admitting: Surgery

## 2024-05-05 DIAGNOSIS — R1084 Generalized abdominal pain: Secondary | ICD-10-CM | POA: Diagnosis not present

## 2024-05-09 ENCOUNTER — Other Ambulatory Visit: Payer: Self-pay

## 2024-05-09 ENCOUNTER — Emergency Department (HOSPITAL_COMMUNITY)

## 2024-05-09 ENCOUNTER — Encounter (HOSPITAL_COMMUNITY): Payer: Self-pay

## 2024-05-09 ENCOUNTER — Emergency Department (HOSPITAL_COMMUNITY)
Admission: EM | Admit: 2024-05-09 | Discharge: 2024-05-10 | Disposition: A | Discharge status: Home / Self Care | Attending: Emergency Medicine | Admitting: Emergency Medicine

## 2024-05-09 DIAGNOSIS — I10 Essential (primary) hypertension: Secondary | ICD-10-CM | POA: Insufficient documentation

## 2024-05-09 DIAGNOSIS — M47816 Spondylosis without myelopathy or radiculopathy, lumbar region: Secondary | ICD-10-CM | POA: Diagnosis not present

## 2024-05-09 DIAGNOSIS — D1803 Hemangioma of intra-abdominal structures: Secondary | ICD-10-CM | POA: Diagnosis not present

## 2024-05-09 DIAGNOSIS — K439 Ventral hernia without obstruction or gangrene: Secondary | ICD-10-CM | POA: Diagnosis not present

## 2024-05-09 DIAGNOSIS — M5442 Lumbago with sciatica, left side: Secondary | ICD-10-CM | POA: Insufficient documentation

## 2024-05-09 DIAGNOSIS — I700 Atherosclerosis of aorta: Secondary | ICD-10-CM | POA: Diagnosis not present

## 2024-05-09 DIAGNOSIS — K449 Diaphragmatic hernia without obstruction or gangrene: Secondary | ICD-10-CM | POA: Insufficient documentation

## 2024-05-09 DIAGNOSIS — N390 Urinary tract infection, site not specified: Secondary | ICD-10-CM | POA: Diagnosis not present

## 2024-05-09 DIAGNOSIS — Z79899 Other long term (current) drug therapy: Secondary | ICD-10-CM | POA: Diagnosis not present

## 2024-05-09 DIAGNOSIS — R109 Unspecified abdominal pain: Secondary | ICD-10-CM | POA: Diagnosis not present

## 2024-05-09 DIAGNOSIS — M160 Bilateral primary osteoarthritis of hip: Secondary | ICD-10-CM | POA: Diagnosis not present

## 2024-05-09 DIAGNOSIS — M545 Low back pain, unspecified: Secondary | ICD-10-CM | POA: Diagnosis present

## 2024-05-09 DIAGNOSIS — M25552 Pain in left hip: Secondary | ICD-10-CM | POA: Diagnosis not present

## 2024-05-09 NOTE — ED Triage Notes (Addendum)
 Pt reports L hip pain x1 day. Pt unable to put any weight on extremity. Recent hernia surgery. No injury reported.

## 2024-05-10 ENCOUNTER — Emergency Department (HOSPITAL_COMMUNITY)

## 2024-05-10 DIAGNOSIS — K439 Ventral hernia without obstruction or gangrene: Secondary | ICD-10-CM | POA: Diagnosis not present

## 2024-05-10 DIAGNOSIS — M5442 Lumbago with sciatica, left side: Secondary | ICD-10-CM | POA: Diagnosis not present

## 2024-05-10 DIAGNOSIS — R109 Unspecified abdominal pain: Secondary | ICD-10-CM | POA: Diagnosis not present

## 2024-05-10 DIAGNOSIS — D1803 Hemangioma of intra-abdominal structures: Secondary | ICD-10-CM | POA: Diagnosis not present

## 2024-05-10 DIAGNOSIS — K449 Diaphragmatic hernia without obstruction or gangrene: Secondary | ICD-10-CM | POA: Diagnosis not present

## 2024-05-10 LAB — COMPREHENSIVE METABOLIC PANEL WITH GFR
ALT: 9 U/L (ref 0–44)
AST: 11 U/L — ABNORMAL LOW (ref 15–41)
Albumin: 3.9 g/dL (ref 3.5–5.0)
Alkaline Phosphatase: 79 U/L (ref 38–126)
Anion gap: 11 (ref 5–15)
BUN: 15 mg/dL (ref 8–23)
CO2: 25 mmol/L (ref 22–32)
Calcium: 9.2 mg/dL (ref 8.9–10.3)
Chloride: 105 mmol/L (ref 98–111)
Creatinine, Ser: 0.99 mg/dL (ref 0.44–1.00)
GFR, Estimated: >60 mL/min (ref >60)
Glucose, Bld: 102 mg/dL — ABNORMAL HIGH (ref 70–99)
Potassium: 3.9 mmol/L (ref 3.5–5.1)
Sodium: 141 mmol/L (ref 135–145)
Total Bilirubin: 0.4 mg/dL (ref 0.0–1.2)
Total Protein: 7 g/dL (ref 6.5–8.1)

## 2024-05-10 LAB — CBC WITH DIFFERENTIAL/PLATELET
Abs Immature Granulocytes: 0.03 K/uL (ref 0.00–0.07)
Basophils Absolute: 0.1 K/uL (ref 0.0–0.1)
Basophils Relative: 1 %
Eosinophils Absolute: 0.7 K/uL — ABNORMAL HIGH (ref 0.0–0.5)
Eosinophils Relative: 8 %
HCT: 37.7 % (ref 36.0–46.0)
Hemoglobin: 11.5 g/dL — ABNORMAL LOW (ref 12.0–15.0)
Immature Granulocytes: 0 %
Lymphocytes Relative: 31 %
Lymphs Abs: 2.6 K/uL (ref 0.7–4.0)
MCH: 27.8 pg (ref 26.0–34.0)
MCHC: 30.5 g/dL (ref 30.0–36.0)
MCV: 91.3 fL (ref 80.0–100.0)
Monocytes Absolute: 0.8 K/uL (ref 0.1–1.0)
Monocytes Relative: 9 %
Neutro Abs: 4.1 K/uL (ref 1.7–7.7)
Neutrophils Relative %: 51 %
Platelets: 276 K/uL (ref 150–400)
RBC: 4.13 MIL/uL (ref 3.87–5.11)
RDW: 14.7 % (ref 11.5–15.5)
WBC: 8.2 K/uL (ref 4.0–10.5)
nRBC: 0 % (ref 0.0–0.2)

## 2024-05-10 LAB — URINALYSIS, W/ REFLEX TO CULTURE (INFECTION SUSPECTED)
Bilirubin Urine: NEGATIVE
Glucose, UA: NEGATIVE mg/dL
Ketones, ur: NEGATIVE mg/dL
Nitrite: POSITIVE — AB
Protein, ur: NEGATIVE mg/dL
Specific Gravity, Urine: 1.009 (ref 1.005–1.030)
pH: 5 (ref 5.0–8.0)

## 2024-05-10 MED ORDER — CEPHALEXIN 500 MG PO CAPS: 500.0000 mg | ORAL_CAPSULE | Freq: Three times a day (TID) | ORAL | 0 refills | Status: AC

## 2024-05-10 MED ORDER — KETOROLAC TROMETHAMINE 15 MG/ML IJ SOLN
15.0000 mg | Freq: Once | INTRAMUSCULAR | Status: AC
  Administered 2024-05-10: 15 mg via INTRAVENOUS
  Filled 2024-05-10: qty 1

## 2024-05-10 MED ORDER — DIAZEPAM 5 MG/ML IJ SOLN
2.5000 mg | Freq: Once | INTRAMUSCULAR | Status: AC
  Administered 2024-05-10: 2.5 mg via INTRAVENOUS
  Filled 2024-05-10: qty 2

## 2024-05-10 MED ORDER — METHOCARBAMOL 500 MG PO TABS: 500.0000 mg | ORAL_TABLET | Freq: Three times a day (TID) | ORAL | 0 refills | Status: AC | PRN

## 2024-05-10 MED ORDER — SODIUM CHLORIDE 0.9 % IV SOLN
1.0000 g | Freq: Once | INTRAVENOUS | Status: AC
  Administered 2024-05-10: 1 g via INTRAVENOUS
  Filled 2024-05-10: qty 10

## 2024-05-10 NOTE — ED Provider Notes (Signed)
 [place] EMERGENCY DEPARTMENT AT [facility] Provider Note  CSN: [id number] Arrival date & time: 05/09/24 2118  Chief Complaint(s) Hip Pain  HPI Jenny Glass is a 73 y.o. female    The history is provided by the patient.  Flank Pain This is a new problem. Episode onset: a few months ago, but exacerbated this evening after getting out of the car. The problem occurs constantly. Pertinent negatives include no chest pain, no abdominal pain, no headaches and no shortness of breath. The symptoms are aggravated by twisting and bending. The symptoms are relieved by lying down.   Pain radiates down left leg.  No recent falls  Reports recent Lap sx for hernia repair 1 month ago. Has been having lower abd discomfort since.  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 Authorizing  Provider  cephALEXin  (KEFLEX ) 500 MG capsule Take 1 capsule (500 mg total) by mouth 3 (three) times daily for 14 days. 05/10/24 05/24/24 Yes Kayley Zeiders, Raynell Moder, MD  methocarbamol  (ROBAXIN ) 500 MG tablet Take 1-2 tablets (500-1,000 mg total) by mouth every 8 (eight) hours as needed for muscle spasms. 05/10/24  Yes Valentina Alcoser, Raynell Moder, MD  acetaminophen  (TYLENOL ) 500 MG tablet Take 500-1,000 mg by mouth every 6 (six) hours as needed (pain.).    [provider]  gabapentin  (NEURONTIN ) 100 MG capsule Take 1 capsule (100 mg total) by mouth 3 (three) times daily as needed. 04/16/24 05/16/24  Stechschulte, Deward PARAS, MD  golimumab  (SIMPONI  ARIA) 50 MG/4ML SOLN injection See admin instructions. 2mg /kg Intravenous every 8 wks    [provider]  oxyCODONE -acetaminophen  (PERCOCET) 5-325 MG tablet Take 1 tablet by mouth every 4 (four) hours as needed for severe pain (pain score 7-10). 04/16/24 04/16/25  Stechschulte, Deward PARAS, MD  pantoprazole  (PROTONIX ) 40 MG tablet Take 40 mg by mouth daily before breakfast. 03/04/24   [provider]  predniSONE  (DELTASONE ) 1 MG tablet Take 2 mg by mouth daily with breakfast. 04/10/22   [provider]  Allergies Patient has no known allergies.  Review of Systems Review of Systems  Respiratory:  Negative for shortness of breath.   Cardiovascular:  Negative for chest pain.  Gastrointestinal:  Negative for abdominal pain.  Genitourinary:  Positive for flank pain.  Neurological:  Negative for headaches.   As noted in HPI  Physical Exam Vital Signs  I have reviewed the triage vital signs BP (!) 188/92 (BP Location: Right Arm)   Pulse 65   Temp 97.9 F (36.6 C) (Oral)   Resp 18   Ht 5' 3 (1.6 m)   Wt 72.6 kg   SpO2 100%   BMI 28.35 kg/m   Physical Exam Vitals reviewed.  Constitutional:      General:  She is not in acute distress.    Appearance: She is well-developed. She is not diaphoretic.  HENT:     Head: Normocephalic and atraumatic.     Right Ear: External ear normal.     Left Ear: External ear normal.     Nose: Nose normal.  Eyes:     General: No scleral icterus.    Conjunctiva/sclera: Conjunctivae normal.  Neck:     Trachea: Phonation normal.  Cardiovascular:     Rate and Rhythm: Normal rate and regular rhythm.  Pulmonary:     Effort: Pulmonary effort is normal. No respiratory distress.     Breath sounds: No stridor.  Abdominal:     General: There is no distension.  Musculoskeletal:        General: Normal range of motion.     Cervical back: Normal range of motion.     Lumbar back: Spasms and tenderness present.       Back:  Neurological:     Mental Status: She is alert and oriented to person, place, and time.  Psychiatric:        Behavior: Behavior normal.     ED Results and Treatments Labs (all labs ordered are listed, but only abnormal results are displayed) Labs Reviewed  CBC WITH DIFFERENTIAL/PLATELET - Abnormal; Notable for the following components:      Result Value   Hemoglobin 11.5 (*)    Eosinophils Absolute 0.7 (*)    All other components within normal limits  COMPREHENSIVE METABOLIC PANEL WITH GFR - Abnormal; Notable for the following components:   Glucose, Bld 102 (*)    AST 11 (*)    All other components within normal limits  URINALYSIS, W/ REFLEX TO CULTURE (INFECTION SUSPECTED) - Abnormal; Notable for the following components:   Hgb urine dipstick MODERATE (*)    Nitrite POSITIVE (*)    Leukocytes,Ua TRACE (*)    Bacteria, UA FEW (*)    All other components within normal limits                                                                                                                         EKG  EKG Interpretation Date/Time:    Ventricular Rate:  PR Interval:    QRS Duration:    QT Interval:    QTC Calculation:   R Axis:       Text Interpretation:         Radiology CT Renal Stone Study Result Date: 05/10/2024 CLINICAL DATA:  Abdominal pain and flank pain. Evaluate for renal calculi. EXAM: CT ABDOMEN AND PELVIS WITHOUT CONTRAST TECHNIQUE: Multidetector CT imaging of the abdomen and pelvis was performed following the standard protocol without IV contrast. RADIATION DOSE REDUCTION: This exam was performed according to the departmental dose-optimization program which includes automated exposure control, adjustment of the mA and/or kV according to patient size and/or use of iterative reconstruction technique. COMPARISON:  01/30/2024 FINDINGS: Lower chest: No acute abnormality. Hepatobiliary: Hemangioma within segment 2 is again seen measuring 2.2 cm, image 21/2. Cyst within segment 5 is unchanged measuring 0.8 cm. No suspicious liver lesions. Normal gallbladder. No bile duct dilatation. Pancreas: Unremarkable. No pancreatic ductal dilatation or surrounding inflammatory changes. Spleen: Normal in size without focal abnormality. Adrenals/Urinary Tract: Normal adrenal glands. Right renal cortical scarring. Unchanged bilateral simple cysts. The largest is off the inferior pole of the left kidney measuring 1.1 cm, image 40/2. No follow-up imaging recommended. No nephrolithiasis. No hydronephrosis. No signs of ureteral calculi or hydroureter. Urinary bladder is unremarkable. Stomach/Bowel: Moderate hiatal hernia. Stomach nondistended. Appendix visualized and normal. Postop change from distal colonic resection with anastomotic suture line identified within the low pelvis. No pathologic dilatation of the large or small bowel loops. No bowel wall thickening or inflammation identified. Vascular/Lymphatic: Aortic atherosclerosis. No enlarged abdominal or pelvic lymph nodes. Reproductive: Uterus and bilateral adnexa are unremarkable. Other: Postoperative changes from interval ventral abdominal wall hernia repair. No free fluid or fluid  collections identified within the abdomen or pelvis. No signs of free air. Musculoskeletal: Lumbar degenerative disc disease. No acute or suspicious osseous abnormalities. IMPRESSION: 1. No acute findings within the abdomen or pelvis. 2. No signs of nephrolithiasis or obstructive uropathy. 3. Moderate hiatal hernia. 4. Postoperative changes from interval ventral abdominal wall hernia repair. 5.  Aortic Atherosclerosis (ICD10-I70.0). Electronically Signed   By: Waddell Calk M.D.   On: 05/10/2024 04:25   DG Hip Unilat With Pelvis 2-3 Views Left Result Date: 05/09/2024 CLINICAL DATA:  Left hip pain for 1 day. Unable to bear weight on the extremity. No injury. EXAM: DG HIP (WITH OR WITHOUT PELVIS) 2-3V LEFT COMPARISON:  CT abdomen and pelvis 01/30/2024 FINDINGS: Degenerative changes in the left hip with joint space narrowing, sclerosis, and osteophyte formation on both sides of the hip. Degenerative changes are also demonstrated in the lower lumbar spine and in the right hip. No evidence of acute fracture or dislocation. No focal bone lesion or bone destruction. Pelvis and sacrum appear intact. SI joints and symphysis pubis are not displaced. Surgical clips in the pelvis consistent with bowel staples. Soft tissues are otherwise unremarkable. IMPRESSION: Moderate degenerative changes in the left hip. No significant change since prior study. No acute bony abnormalities. Electronically Signed   By: Elsie Gravely M.D.   On: 05/09/2024 22:27    Medications Ordered in ED Medications  diazepam  (VALIUM ) injection 2.5 mg (2.5 mg Intravenous Given 05/10/24 0225)  cefTRIAXone  (ROCEPHIN ) 1 g in sodium chloride  0.9 % 100 mL IVPB (0 g Intravenous Stopped 05/10/24 0500)  ketorolac  (TORADOL ) 15 MG/ML injection 15 mg (15 mg Intravenous Given 05/10/24 0424)   Procedures Procedures  (including critical care time) Medical Decision Making / ED Course   Medical Decision Making  Amount and/or Complexity of Data Reviewed Labs:  ordered. Radiology: ordered and independent interpretation performed. Decision-making details documented in ED Course.  Risk Prescription drug management. Parenteral controlled substances.    Left flank pain DDx considered and noted below  Muscle strain/spasm likely. UA with evidence of infection. No leukocytosis. No fever. Cannot rule out pyelo. Hematuria noted as well. CT stone study negative for renal stones or perinephric stranding. No other acute findings on CT.  Given toradol  and valium . Pain improved and now able to ambulate.  Will treat for UTI as well.    Final Clinical Impression(s) / ED Diagnoses Final diagnoses:  Acute left-sided low back pain with left-sided sciatica  Acute UTI   The patient appears reasonably screened and/or stabilized for discharge and I doubt any other medical condition or other [facility] requiring further screening, evaluation, or treatment in the ED at this time. I have discussed the findings, Dx and Tx plan with the patient/family who expressed understanding and agree(s) with the plan. Discharge instructions discussed at length. The patient/family was given strict return precautions who verbalized understanding of the instructions. No further questions at time of discharge.  Disposition: Discharge  Condition: Good  ED Discharge Orders          Ordered    cephALEXin  (KEFLEX ) 500 MG capsule  3 times daily        05/10/24 0701    methocarbamol  (ROBAXIN ) 500 MG tablet  Every 8 hours PRN        05/10/24 0701              Follow Up: Verdia Lombard, MD [address] [place] [place] [address] [phone]  Call  to schedule an appointment for close follow up     This chart was dictated using voice recognition software.  Despite best efforts to proofread,  errors can occur which can change the documentation meaning.    Trine Raynell Moder, MD 05/10/24 [phone]

## 2024-05-10 NOTE — Discharge Instructions (Addendum)
 For pain control you may take 1000 mg of acetaminophen (Tylenol) every 8 hours and/or 600 mg of Ibuprofen (Motrin, Advil, etc.) every 6-8 hours as needed.  Please limit acetaminophen (Tylenol) to 4000 mg and Ibuprofen (Motrin, Advil, etc.) to 2400 mg for a 24hr period. Please note that other over-the-counter medicine may contain acetaminophen or ibuprofen as a component of their ingredients.

## 2024-05-14 ENCOUNTER — Ambulatory Visit
Admission: RE | Admit: 2024-05-14 | Discharge: 2024-05-14 | Disposition: A | Discharge status: Home / Self Care | Source: Ambulatory Visit | Attending: Surgery | Admitting: Surgery

## 2024-05-14 DIAGNOSIS — K449 Diaphragmatic hernia without obstruction or gangrene: Secondary | ICD-10-CM | POA: Diagnosis not present

## 2024-05-14 DIAGNOSIS — D1803 Hemangioma of intra-abdominal structures: Secondary | ICD-10-CM | POA: Diagnosis not present

## 2024-05-14 DIAGNOSIS — R1084 Generalized abdominal pain: Secondary | ICD-10-CM

## 2024-05-14 DIAGNOSIS — K7689 Other specified diseases of liver: Secondary | ICD-10-CM | POA: Diagnosis not present

## 2024-05-14 MED ORDER — IOPAMIDOL (ISOVUE-370) INJECTION 76%
80.0000 mL | Freq: Once | INTRAVENOUS | Status: AC | PRN
  Administered 2024-05-14: 80 mL via INTRAVENOUS

## 2024-05-18 DIAGNOSIS — M0609 Rheumatoid arthritis without rheumatoid factor, multiple sites: Secondary | ICD-10-CM | POA: Diagnosis not present

## 2024-07-13 DIAGNOSIS — M0609 Rheumatoid arthritis without rheumatoid factor, multiple sites: Secondary | ICD-10-CM | POA: Diagnosis not present

## 2024-08-11 DIAGNOSIS — Z23 Encounter for immunization: Secondary | ICD-10-CM | POA: Diagnosis not present

## 2024-08-13 DIAGNOSIS — Z23 Encounter for immunization: Secondary | ICD-10-CM | POA: Diagnosis not present

## 2024-09-07 DIAGNOSIS — R5383 Other fatigue: Secondary | ICD-10-CM | POA: Diagnosis not present

## 2024-09-07 DIAGNOSIS — Z111 Encounter for screening for respiratory tuberculosis: Secondary | ICD-10-CM | POA: Diagnosis not present

## 2024-09-07 DIAGNOSIS — M0609 Rheumatoid arthritis without rheumatoid factor, multiple sites: Secondary | ICD-10-CM | POA: Diagnosis not present

## 2024-09-25 DIAGNOSIS — M0609 Rheumatoid arthritis without rheumatoid factor, multiple sites: Secondary | ICD-10-CM | POA: Diagnosis not present

## 2024-09-25 DIAGNOSIS — G629 Polyneuropathy, unspecified: Secondary | ICD-10-CM | POA: Diagnosis not present

## 2024-09-25 DIAGNOSIS — Z79899 Other long term (current) drug therapy: Secondary | ICD-10-CM | POA: Diagnosis not present

## 2024-09-25 DIAGNOSIS — E663 Overweight: Secondary | ICD-10-CM | POA: Diagnosis not present

## 2024-09-25 DIAGNOSIS — M1991 Primary osteoarthritis, unspecified site: Secondary | ICD-10-CM | POA: Diagnosis not present

## 2024-09-25 DIAGNOSIS — Z6827 Body mass index (BMI) 27.0-27.9, adult: Secondary | ICD-10-CM | POA: Diagnosis not present

## 2024-09-25 DIAGNOSIS — M48061 Spinal stenosis, lumbar region without neurogenic claudication: Secondary | ICD-10-CM | POA: Diagnosis not present
